# Patient Record
Sex: Female | Born: 1950 | Race: White | Hispanic: No | Marital: Married | Smoking: Never smoker
Health system: Southern US, Community
[De-identification: ages and names within clinical notes are randomized; demographics above are authoritative.]

## PROBLEM LIST (undated history)

## (undated) DIAGNOSIS — M1990 Unspecified osteoarthritis, unspecified site: Secondary | ICD-10-CM

## (undated) DIAGNOSIS — R0600 Dyspnea, unspecified: Secondary | ICD-10-CM

## (undated) DIAGNOSIS — M8580 Other specified disorders of bone density and structure, unspecified site: Secondary | ICD-10-CM

## (undated) DIAGNOSIS — C50911 Malignant neoplasm of unspecified site of right female breast: Secondary | ICD-10-CM

## (undated) DIAGNOSIS — D649 Anemia, unspecified: Secondary | ICD-10-CM

## (undated) DIAGNOSIS — R112 Nausea with vomiting, unspecified: Secondary | ICD-10-CM

## (undated) DIAGNOSIS — F419 Anxiety disorder, unspecified: Secondary | ICD-10-CM

## (undated) DIAGNOSIS — J449 Chronic obstructive pulmonary disease, unspecified: Secondary | ICD-10-CM

## (undated) DIAGNOSIS — C50919 Malignant neoplasm of unspecified site of unspecified female breast: Secondary | ICD-10-CM

## (undated) DIAGNOSIS — Z803 Family history of malignant neoplasm of breast: Secondary | ICD-10-CM

## (undated) DIAGNOSIS — Z9889 Other specified postprocedural states: Secondary | ICD-10-CM

## (undated) DIAGNOSIS — C189 Malignant neoplasm of colon, unspecified: Secondary | ICD-10-CM

## (undated) DIAGNOSIS — A319 Mycobacterial infection, unspecified: Secondary | ICD-10-CM

## (undated) DIAGNOSIS — H019 Unspecified inflammation of eyelid: Secondary | ICD-10-CM

## (undated) DIAGNOSIS — T7840XA Allergy, unspecified, initial encounter: Secondary | ICD-10-CM

## (undated) HISTORY — DX: Unspecified inflammation of eyelid: H01.9

## (undated) HISTORY — PX: DILATION AND CURETTAGE OF UTERUS: SHX78

## (undated) HISTORY — DX: Malignant neoplasm of unspecified site of unspecified female breast: C50.919

## (undated) HISTORY — PX: BACK SURGERY: SHX140

## (undated) HISTORY — DX: Family history of malignant neoplasm of breast: Z80.3

---
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
## 1993-05-02 DIAGNOSIS — T7840XA Allergy, unspecified, initial encounter: Secondary | ICD-10-CM

## 1993-05-02 HISTORY — DX: Allergy, unspecified, initial encounter: T78.40XA

## 1998-07-09 ENCOUNTER — Other Ambulatory Visit: Admission: RE | Admit: 1998-07-09 | Discharge: 1998-07-09 | Payer: Self-pay | Admitting: Obstetrics and Gynecology

## 1998-07-22 ENCOUNTER — Encounter: Payer: Self-pay | Admitting: Obstetrics & Gynecology

## 1998-07-22 ENCOUNTER — Ambulatory Visit (HOSPITAL_COMMUNITY): Admission: RE | Admit: 1998-07-22 | Discharge: 1998-07-22 | Payer: Self-pay | Admitting: Obstetrics & Gynecology

## 1999-03-01 ENCOUNTER — Encounter: Admission: RE | Admit: 1999-03-01 | Discharge: 1999-03-01 | Payer: Self-pay | Admitting: Obstetrics and Gynecology

## 1999-03-01 ENCOUNTER — Encounter: Payer: Self-pay | Admitting: Obstetrics and Gynecology

## 1999-11-11 ENCOUNTER — Other Ambulatory Visit: Admission: RE | Admit: 1999-11-11 | Discharge: 1999-11-11 | Payer: Self-pay | Admitting: Obstetrics and Gynecology

## 2001-05-02 HISTORY — PX: LUMBAR DISC SURGERY: SHX700

## 2001-05-02 HISTORY — PX: BACK SURGERY: SHX140

## 2001-05-11 ENCOUNTER — Encounter: Payer: Self-pay | Admitting: Family Medicine

## 2001-05-11 ENCOUNTER — Encounter: Admission: RE | Admit: 2001-05-11 | Discharge: 2001-05-11 | Payer: Self-pay | Admitting: Family Medicine

## 2001-05-17 ENCOUNTER — Encounter: Payer: Self-pay | Admitting: Neurological Surgery

## 2001-05-17 ENCOUNTER — Ambulatory Visit (HOSPITAL_COMMUNITY): Admission: RE | Admit: 2001-05-17 | Discharge: 2001-05-18 | Payer: Self-pay | Admitting: Neurological Surgery

## 2001-05-31 ENCOUNTER — Other Ambulatory Visit: Admission: RE | Admit: 2001-05-31 | Discharge: 2001-05-31 | Payer: Self-pay | Admitting: Obstetrics and Gynecology

## 2001-07-05 ENCOUNTER — Encounter: Payer: Self-pay | Admitting: Obstetrics and Gynecology

## 2001-07-05 ENCOUNTER — Encounter: Admission: RE | Admit: 2001-07-05 | Discharge: 2001-07-05 | Payer: Self-pay | Admitting: Obstetrics and Gynecology

## 2002-11-27 ENCOUNTER — Encounter: Payer: Self-pay | Admitting: Emergency Medicine

## 2002-11-27 ENCOUNTER — Observation Stay (HOSPITAL_COMMUNITY): Admission: EM | Admit: 2002-11-27 | Discharge: 2002-11-28 | Payer: Self-pay

## 2005-08-19 ENCOUNTER — Encounter: Admission: RE | Admit: 2005-08-19 | Discharge: 2005-08-19 | Payer: Self-pay | Admitting: Family Medicine

## 2010-08-26 ENCOUNTER — Emergency Department (HOSPITAL_COMMUNITY)
Admission: EM | Admit: 2010-08-26 | Discharge: 2010-08-26 | Disposition: A | Discharge status: Home / Self Care | Payer: BC Managed Care – PPO | Attending: Emergency Medicine | Admitting: Emergency Medicine

## 2010-08-26 DIAGNOSIS — R42 Dizziness and giddiness: Secondary | ICD-10-CM | POA: Insufficient documentation

## 2010-08-26 DIAGNOSIS — R55 Syncope and collapse: Secondary | ICD-10-CM | POA: Insufficient documentation

## 2016-03-04 ENCOUNTER — Other Ambulatory Visit: Payer: Self-pay | Admitting: Nurse Practitioner

## 2016-03-04 DIAGNOSIS — Z1231 Encounter for screening mammogram for malignant neoplasm of breast: Secondary | ICD-10-CM

## 2016-03-04 DIAGNOSIS — E2839 Other primary ovarian failure: Secondary | ICD-10-CM

## 2017-05-03 ENCOUNTER — Ambulatory Visit: Payer: BLUE CROSS/BLUE SHIELD | Admitting: Radiation Oncology

## 2017-09-29 ENCOUNTER — Other Ambulatory Visit: Payer: Self-pay | Admitting: Nurse Practitioner

## 2017-09-29 DIAGNOSIS — N63 Unspecified lump in unspecified breast: Secondary | ICD-10-CM

## 2017-09-30 DIAGNOSIS — C50911 Malignant neoplasm of unspecified site of right female breast: Secondary | ICD-10-CM

## 2017-09-30 HISTORY — PX: BREAST BIOPSY: SHX20

## 2017-09-30 HISTORY — DX: Malignant neoplasm of unspecified site of right female breast: C50.911

## 2017-10-05 ENCOUNTER — Other Ambulatory Visit: Payer: Self-pay | Admitting: Nurse Practitioner

## 2017-10-05 DIAGNOSIS — N63 Unspecified lump in unspecified breast: Secondary | ICD-10-CM

## 2017-10-19 ENCOUNTER — Ambulatory Visit
Admission: RE | Admit: 2017-10-19 | Discharge: 2017-10-19 | Disposition: A | Discharge status: Home / Self Care | Payer: Medicare Other | Source: Ambulatory Visit | Attending: Nurse Practitioner | Admitting: Nurse Practitioner

## 2017-10-19 ENCOUNTER — Other Ambulatory Visit: Payer: Self-pay | Admitting: Nurse Practitioner

## 2017-10-19 DIAGNOSIS — N63 Unspecified lump in unspecified breast: Secondary | ICD-10-CM

## 2017-10-19 DIAGNOSIS — R599 Enlarged lymph nodes, unspecified: Secondary | ICD-10-CM

## 2017-10-19 DIAGNOSIS — N6310 Unspecified lump in the right breast, unspecified quadrant: Secondary | ICD-10-CM

## 2017-10-23 ENCOUNTER — Ambulatory Visit
Admission: RE | Admit: 2017-10-23 | Discharge: 2017-10-23 | Disposition: A | Discharge status: Home / Self Care | Payer: Medicare Other | Source: Ambulatory Visit | Attending: Nurse Practitioner | Admitting: Nurse Practitioner

## 2017-10-23 ENCOUNTER — Other Ambulatory Visit: Payer: Self-pay | Admitting: Nurse Practitioner

## 2017-10-23 DIAGNOSIS — R599 Enlarged lymph nodes, unspecified: Secondary | ICD-10-CM

## 2017-10-23 DIAGNOSIS — N6310 Unspecified lump in the right breast, unspecified quadrant: Secondary | ICD-10-CM

## 2017-10-25 ENCOUNTER — Encounter: Payer: Self-pay | Admitting: *Deleted

## 2017-10-25 ENCOUNTER — Telehealth: Payer: Self-pay | Admitting: Oncology

## 2017-10-25 NOTE — Telephone Encounter (Signed)
Spoke with patient to confirm morning BC appointment for 7/3, packet mailed to patient

## 2017-10-27 ENCOUNTER — Other Ambulatory Visit: Payer: Self-pay | Admitting: *Deleted

## 2017-10-27 DIAGNOSIS — C50511 Malignant neoplasm of lower-outer quadrant of right female breast: Secondary | ICD-10-CM | POA: Insufficient documentation

## 2017-10-27 DIAGNOSIS — Z170 Estrogen receptor positive status [ER+]: Principal | ICD-10-CM

## 2017-10-31 NOTE — Progress Notes (Signed)
[place]  Telephone:([phone]) [phone] Fax:(336) [phone]     ID: Sonia Sandoval DOB: [name] 02, 1951  MR#: [id number]  AQT#:[id number]  Patient Care Team: [name], [place] as PCP - General (Nurse Practitioner) [name], MD as Consulting Physician (General Surgery) Payslee Bateson, [name], MD as Consulting Physician (Oncology) [name], MD as Consulting Physician (Radiation Oncology) OTHER MD:  CHIEF COMPLAINT: Estrogen receptor positive breast cancer  CURRENT TREATMENT: Neoadjuvant treatment   HISTORY OF CURRENT ILLNESS: "Sonia Sandoval" felt that her right breast was noticeably smaller than the left. She brought this to her nurse practitioner's attention.  She had not had mammography in several years.  Sonia Sandoval was set up for bilateral diagnostic mammography with tomography and right breast ultrasonography at [facility] on 10/19/2017 showing: Breast density category B.  Highly suspicious mass in the right lower outer quadrant measuring 3.3 x 2.8 x 3.2 cm,  2 cm from the nipple with associated skin retraction. The right axilla showed a 9 mm lymph node with cortical thickening. There was no evidence of malignancy in the left breast.   Accordingly on 10/23/2017 she proceeded to biopsy of the right breast area in question. The pathology from this procedure showed ([id number]): Invasive ductal carcinoma grade III. The right axillary lymph node was also positive.  There was no lymph node tissue identified. Prognostic indicators significant for: estrogen receptor, 95% positive and progesterone receptor, 45% positive, both with strong staining intensity. Proliferation marker Ki67 at 20%. HER2  not amplified with ratios HER2/CEP17 signals 1.25 and average HER2 copies per cell 3.00  The patient's subsequent history is as detailed below.  INTERVAL HISTORY: Sonia Sandoval was evaluated in the multidisciplinary breast cancer clinic on  11/01/2017 accompanied by her husband, [name]. Her case was also  presented at the multidisciplinary breast cancer conference on the same day. At that time a preliminary plan was proposed: Neoadjuvant treatment was suggested, Mammaprint will decide whether she receives chemo or not; breast conserving surgery to follow, as well as adjuvant radiation   REVIEW OF SYSTEMS: Aside from the breast mass itself, there were no suspicious symptoms leading to the diagnostic mammogram.  The patient denies unusual headaches, visual changes, nausea, vomiting, stiff neck, dizziness, or gait imbalance. There has been no cough, phlegm production, or pleurisy, no chest pain or pressure, and no change in bowel or bladder habits. The patient denies fever, rash, bleeding, unexplained fatigue or unexplained weight loss. A detailed review of systems was otherwise entirely negative.   PAST MEDICAL HISTORY: History reviewed. No pertinent past medical history.  She denies a history of cataracts, glaucoma, emphysema, asthma, HTN, heart murmur or palpitations, GERD, stomach ulcers, issues with bowels or bladder.    PAST SURGICAL HISTORY: History reviewed. No pertinent surgical history.  Ruptured vertebral disk surgery in 2003 under Dr. Ellene Route  FAMILY HISTORY Family History  Problem Relation Age of Onset  . Lung cancer Father     The patient's father died at age 30 due to lung cancer (heavy smoker). The patient's mother died at age 74 due to MI and Alzheimer's. The patient had no brothers or sisters. The patient's mother was 1/10 siblings, with a few other siblings having a history of Alzheimer's. There was a maternal aunt who may have had ovarian cancer, and the patient is going to verify this with family.   GYNECOLOGIC HISTORY:  No LMP recorded. Menarche: 67 years old Age at first live birth: 67 years old She is GXP4.  LMP: about 25 years ago (  early 34's). She used hormone replacement for about 3-5 years. She also used oral contraception for 1 year with no complications.     SOCIAL HISTORY:  Sonia Sandoval is retired from working at [employer]. Her husband of 48 years, [name], is a Writer. He also used to be a paramedic. The patient and her husband share 1 daughter, Cloyde Reams age 64 who lives in [place], [place] as a Writer. The patient has 3 sons from a previous marriage. The oldest, A.C. Penn Marcello Moores age 1 is disabled with epilepsy. The second son B. Desmond Lope age 14, lives in [place], [place] in Press photographer. The third son, H. Vevelyn Royals age 11, lives in [place], [place] in sales.      ADVANCED DIRECTIVES: The patient's husband, [name] is her HCPOA   HEALTH MAINTENANCE: Social History   Tobacco Use  . Smoking status: Never Smoker  . Smokeless tobacco: Never Used  Substance Use Topics  . Alcohol use: Yes  . Drug use: Never     Colonoscopy: 2013 in [place]  PAP: 2014  Bone density: 2014/ osteopenia   Allergies  Allergen Reactions  . Codeine Nausea And Vomiting    headache    Current Outpatient Medications  Medication Sig Dispense Refill  . naproxen sodium (ALEVE) 220 MG tablet Take 220 mg by mouth.    . Nutritional Supplements (JUICE PLUS FIBRE PO) Take by mouth daily.     No current facility-administered medications for this visit.     OBJECTIVE: Middle-aged white woman who appears well  Vitals:   11/01/17 0912  BP: (!) 147/87  Pulse: 74  Resp: 17  Temp: 98.5 F (36.9 C)  SpO2: 100%     Body mass index is 26.7 kg/m.   Wt Readings from Last 3 Encounters:  11/01/17 146 lb (66.2 kg)      ECOG FS:0 - Asymptomatic  Ocular: Sclerae unicteric, pupils round and equal Ear-nose-throat: Oropharynx clear and moist Lymphatic: No cervical or supraclavicular adenopathy Lungs no rales or rhonchi Heart regular rate and rhythm Abd soft, nontender, positive bowel sounds MSK no focal spinal tenderness, no joint edema Neuro: non-focal, well-oriented, appropriate affect Breasts: The right breast shows a  distortion in contour, with the nipple pointing slightly downward with retraction.  This is imaged below.  There is no erythema.  The left breast is benign.  Both axillae are benign.  Right Breast 11/01/2017      LAB RESULTS:  CMP     Component Value Date/Time   NA 139 11/01/2017 0850   K 3.9 11/01/2017 0850   CL 105 11/01/2017 0850   CO2 27 11/01/2017 0850   GLUCOSE 91 11/01/2017 0850   BUN 16 11/01/2017 0850   CREATININE 0.79 11/01/2017 0850   CALCIUM 9.7 11/01/2017 0850   PROT 7.0 11/01/2017 0850   ALBUMIN 4.5 11/01/2017 0850   AST 21 11/01/2017 0850   ALT 20 11/01/2017 0850   ALKPHOS 65 11/01/2017 0850   BILITOT 0.5 11/01/2017 0850   GFRNONAA >60 11/01/2017 0850   GFRAA >60 11/01/2017 0850    No results found for: TOTALPROTELP, ALBUMINELP, A1GS, A2GS, BETS, BETA2SER, GAMS, MSPIKE, SPEI  No results found for: KPAFRELGTCHN, LAMBDASER, [facility]  Lab Results  Component Value Date   WBC 3.5 (L) 11/01/2017   NEUTROABS 2.0 11/01/2017   HGB 13.8 11/01/2017   HCT 41.7 11/01/2017   MCV 92.0 11/01/2017   PLT 246 11/01/2017    _0 @  No results found for: LABCA2  No components found for: GOTLXB262  No results for input(s): INR in the last 168 hours.  No results found for: LABCA2  No results found for: MBT597  No results found for: CBU384  No results found for: TXM468  No results found for: CA2729  No components found for: HGQUANT  No results found for: CEA1 / No results found for: CEA1   No results found for: AFPTUMOR  No results found for: CHROMOGRNA  No results found for: PSA1  Appointment on 11/01/2017  Component Date Value Ref Range Status  . Sodium 11/01/2017 139  135 - 145 mmol/L Final   Please note reference intervals were recently updated.  . Potassium 11/01/2017 3.9  3.5 - 5.1 mmol/L Final  . Chloride 11/01/2017 105  98 - 111 mmol/L Final  . CO2 11/01/2017 27  22 - 32 mmol/L Final  . Glucose, Bld 11/01/2017 91  70 - 99  mg/dL Final  . BUN 11/01/2017 16  8 - 23 mg/dL Final   Please note change in reference range.  . Creatinine 11/01/2017 0.79  0.44 - 1.00 mg/dL Final  . Calcium 11/01/2017 9.7  8.9 - 10.3 mg/dL Final  . Total Protein 11/01/2017 7.0  6.5 - 8.1 g/dL Final  . Albumin 11/01/2017 4.5  3.5 - 5.0 g/dL Final  . AST 11/01/2017 21  15 - 41 U/L Final  . ALT 11/01/2017 20  0 - 44 U/L Final  . Alkaline Phosphatase 11/01/2017 65  38 - 126 U/L Final  . Total Bilirubin 11/01/2017 0.5  0.3 - 1.2 mg/dL Final  . GFR, Est Non Af Am 11/01/2017 >60  >60 mL/min Final  . GFR, Est AFR Am 11/01/2017 >60  >60 mL/min Final   Comment: (NOTE) The eGFR has been calculated using the CKD EPI equation. This calculation has not been validated in all clinical situations. eGFR's persistently <60 mL/min signify possible Chronic Kidney Disease.   Georgiann Hahn gap 11/01/2017 7  5 - 15 Final   Performed at [facility], [place] [address] [place], [place] [address]  . WBC Count 11/01/2017 3.5* 3.9 - 10.3 K/uL Final  . RBC 11/01/2017 4.53  3.70 - 5.45 MIL/uL Final  . Hemoglobin 11/01/2017 13.8  11.6 - 15.9 g/dL Final  . HCT 11/01/2017 41.7  34.8 - 46.6 % Final  . MCV 11/01/2017 92.0  79.5 - 101.0 fL Final  . MCH 11/01/2017 30.4  25.1 - 34.0 pg Final  . MCHC 11/01/2017 33.0  31.5 - 36.0 g/dL Final  . RDW 11/01/2017 12.8  11.2 - 14.5 % Final  . Platelet Count 11/01/2017 246  145 - 400 K/uL Final  . Neutrophils Relative % 11/01/2017 57  % Final  . Neutro Abs 11/01/2017 2.0  1.5 - 6.5 K/uL Final  . Lymphocytes Relative 11/01/2017 32  % Final  . Lymphs Abs 11/01/2017 1.1  0.9 - 3.3 K/uL Final  . Monocytes Relative 11/01/2017 8  % Final  . Monocytes Absolute 11/01/2017 0.3  0.1 - 0.9 K/uL Final  . Eosinophils Relative 11/01/2017 2  % Final  . Eosinophils Absolute 11/01/2017 0.1  0.0 - 0.5 K/uL Final  . Basophils Relative 11/01/2017 1  % Final  . Basophils Absolute 11/01/2017 0.0  0.0 - 0.1 K/uL Final    Performed at [facility], [place] [address], [place], [place] [address]    (this displays the last labs from the last 3 days)  No results found for: TOTALPROTELP, ALBUMINELP, A1GS,  A2GS, BETS, BETA2SER, GAMS, MSPIKE, SPEI (this displays SPEP labs)  No results found for: KPAFRELGTCHN, LAMBDASER, KAPLAMBRATIO (kappa/lambda light chains)  No results found for: HGBA, HGBA2QUANT, HGBFQUANT, HGBSQUAN (Hemoglobinopathy evaluation)   No results found for: LDH  No results found for: IRON, TIBC, IRONPCTSAT (Iron and TIBC)  No results found for: FERRITIN  Urinalysis No results found for: COLORURINE, APPEARANCEUR, LABSPEC, PHURINE, GLUCOSEU, HGBUR, BILIRUBINUR, KETONESUR, PROTEINUR, UROBILINOGEN, NITRITE, LEUKOCYTESUR   STUDIES: US Breast Ltd Uni Right Inc Axilla  Result Date: 10/19/2017 CLINICAL DATA:  67 year old female with new palpable RIGHT breast mass identified on self-examination. Also for annual bilateral mammograms. EXAM: DIGITAL DIAGNOSTIC BILATERAL MAMMOGRAM WITH CAD AND TOMO ULTRASOUND RIGHT BREAST COMPARISON:  05/25/2012 and prior mammograms ACR Breast Density Category b: There are scattered areas of fibroglandular density. FINDINGS: 2D/3D full field views of both breasts and spot compression view of the RIGHT breast demonstrate an irregular mass within the LOWER RIGHT breast with skin retraction. No suspicious LEFT breast findings are identified. Mammographic images were processed with CAD. On physical exam, a firm palpable fixed mass with overlying skin retraction at the 6:30 position of the RIGHT breast 2 cm from the nipple. Targeted ultrasound is performed, showing a 3.3 x 2.8 x 3.2 cm irregular hypoechoic mass at the 6:30 position of the RIGHT breast 2 cm from the nipple, with extension to the skin and close to the chest wall. A 9 mm low RIGHT axillary lymph node with cortical thickening is noted. IMPRESSION: 1. Highly suspicious 3.3 cm LOWER OUTER RIGHT  breast mass with associated skin retraction and one suspicious low RIGHT axillary lymph node. Tissue sampling of the RIGHT breast mass and RIGHT axillary lymph node recommended. 2. No mammographic evidence of LEFT breast malignancy. RECOMMENDATION: Ultrasound-guided RIGHT breast mass biopsy and ultrasound-guided RIGHT axillary lymph node biopsy, which has been scheduled for 10/23/2017. I have discussed the findings and recommendations with the patient. Results were also provided in writing at the conclusion of the visit. If applicable, a reminder letter will be sent to the patient regarding the next appointment. BI-RADS CATEGORY  5: Highly suggestive of malignancy. Electronically Signed   By: Margarette Canada M.D.   On: 10/19/2017 12:20   Mm Diag Breast Tomo Bilateral  Result Date: 10/19/2017 CLINICAL DATA:  67 year old female with new palpable RIGHT breast mass identified on self-examination. Also for annual bilateral mammograms. EXAM: DIGITAL DIAGNOSTIC BILATERAL MAMMOGRAM WITH CAD AND TOMO ULTRASOUND RIGHT BREAST COMPARISON:  05/25/2012 and prior mammograms ACR Breast Density Category b: There are scattered areas of fibroglandular density. FINDINGS: 2D/3D full field views of both breasts and spot compression view of the RIGHT breast demonstrate an irregular mass within the LOWER RIGHT breast with skin retraction. No suspicious LEFT breast findings are identified. Mammographic images were processed with CAD. On physical exam, a firm palpable fixed mass with overlying skin retraction at the 6:30 position of the RIGHT breast 2 cm from the nipple. Targeted ultrasound is performed, showing a 3.3 x 2.8 x 3.2 cm irregular hypoechoic mass at the 6:30 position of the RIGHT breast 2 cm from the nipple, with extension to the skin and close to the chest wall. A 9 mm low RIGHT axillary lymph node with cortical thickening is noted. IMPRESSION: 1. Highly suspicious 3.3 cm LOWER OUTER RIGHT breast mass with associated skin  retraction and one suspicious low RIGHT axillary lymph node. Tissue sampling of the RIGHT breast mass and RIGHT axillary lymph node recommended. 2. No mammographic evidence of LEFT breast  malignancy. RECOMMENDATION: Ultrasound-guided RIGHT breast mass biopsy and ultrasound-guided RIGHT axillary lymph node biopsy, which has been scheduled for 10/23/2017. I have discussed the findings and recommendations with the patient. Results were also provided in writing at the conclusion of the visit. If applicable, a reminder letter will be sent to the patient regarding the next appointment. BI-RADS CATEGORY  5: Highly suggestive of malignancy. Electronically Signed   By: Margarette Canada M.D.   On: 10/19/2017 12:20   [place] Axillary Node Core Biopsy Right  Addendum Date: 10/25/2017   ADDENDUM REPORT: 10/25/2017 12:24 ADDENDUM: Pathology revealed GRADE III - INVASIVE DUCTAL CARCINOMA of RIGHT breast, axilla, 6:30 o'clock. This was found to be concordant by Dr. Ammie Ferrier. Pathology revealed GRADE III - INVASIVE DUCTAL CARCINOMA. LYMPHOID TISSUE IS NOT IDENTIFIED of RIGHT axilla, which may represent an entirely replaced lymph node. This was found to be concordant by Dr. Ammie Ferrier. Pathology results were discussed with the patient by telephone. The patient reported doing well after the biopsy with tenderness at the site. Post biopsy instructions and care were reviewed and questions were answered. The patient was encouraged to call [facility] for any additional concerns. The patient was referred to [facility] on November 01, 2017. Pathology results reported by Roselind Messier, RN on 10/25/2017. Electronically Signed   By: Ammie Ferrier M.D.   On: 10/25/2017 12:24   Result Date: 10/25/2017 CLINICAL DATA:  67 year old female presenting for ultrasound-guided biopsy of a right breast mass and a right axillary lymph node. EXAM:  ULTRASOUND GUIDED RIGHT BREAST CORE NEEDLE BIOPSY COMPARISON:  Previous exam(s). FINDINGS: I met with the patient and we discussed the procedure of ultrasound-guided biopsy, including benefits and alternatives. We discussed the high likelihood of a successful procedure. We discussed the risks of the procedure, including infection, bleeding, tissue injury, clip migration, and inadequate sampling. Informed written consent was given. The usual time-out protocol was performed immediately prior to the procedure. Lesion quadrant: Lower outer quadrant Using sterile technique and 1% Lidocaine as local anesthetic, under direct ultrasound visualization, a 14 gauge spring-loaded device was used to perform biopsy of a mass in the right breast at 6:30 using an inferior approach. At the conclusion of the procedure a ribbon shaped tissue marker clip was deployed into the biopsy cavity. Lesion quadrant: Right axilla Using sterile technique and 1% Lidocaine as local anesthetic, under direct ultrasound visualization, a 14 gauge spring-loaded device was used to perform biopsy of a right axillary lymph node using an inferior approach. At the conclusion of the procedure a HydroMARK tissue marker clip was deployed into the biopsy cavity. Follow up 2 view mammogram was performed and dictated separately. IMPRESSION: 1. Ultrasound guided biopsy of a right breast mass at 6:30. No apparent complications. 2. Ultrasound-guided biopsy of a right axillary lymph node. No apparent complications. Electronically Signed: By: Ammie Ferrier M.D. On: 10/23/2017 13:37   Mm Clip Placement Right  Result Date: 10/23/2017 CLINICAL DATA:  Post biopsy mammogram of the right breast for clip placement. EXAM: DIAGNOSTIC RIGHT MAMMOGRAM POST ULTRASOUND BIOPSY COMPARISON:  Previous exam(s). FINDINGS: Mammographic images were obtained following ultrasound guided biopsy of a mass in the right breast and a right axillary lymph node. The ribbon shaped biopsy  marking clip is positioned within the biopsied mass along the superolateral margin. The biopsy marking clip in the right axilla cannot be seen due to its far posterior location. IMPRESSION: 1. The  ribbon shaped biopsy marking clip is positioned at the site of biopsy and located along the superolateral aspect of the mass. 2. The biopsy marking clip in the right axilla cannot be seen due to its deep position. Final Assessment: Post Procedure Mammograms for Marker Placement Electronically Signed   By: Ammie Ferrier M.D.   On: 10/23/2017 13:47   [place] Rt Breast Bx W Loc Dev 1st Lesion Img Bx Spec US Guide  Addendum Date: 10/25/2017   ADDENDUM REPORT: 10/25/2017 12:24 ADDENDUM: Pathology revealed GRADE III - INVASIVE DUCTAL CARCINOMA of RIGHT breast, axilla, 6:30 o'clock. This was found to be concordant by Dr. Ammie Ferrier. Pathology revealed GRADE III - INVASIVE DUCTAL CARCINOMA. LYMPHOID TISSUE IS NOT IDENTIFIED of RIGHT axilla, which may represent an entirely replaced lymph node. This was found to be concordant by Dr. Ammie Ferrier. Pathology results were discussed with the patient by telephone. The patient reported doing well after the biopsy with tenderness at the site. Post biopsy instructions and care were reviewed and questions were answered. The patient was encouraged to call [facility] for any additional concerns. The patient was referred to [facility] on November 01, 2017. Pathology results reported by Roselind Messier, RN on 10/25/2017. Electronically Signed   By: Ammie Ferrier M.D.   On: 10/25/2017 12:24   Addendum Date: 10/23/2017   ADDENDUM REPORT: 10/23/2017 13:58 ADDENDUM: The hydromark biopsy marking clip shaped used is a spring. Electronically Signed   By: Ammie Ferrier M.D.   On: 10/23/2017 13:58   Result Date: 10/25/2017 CLINICAL DATA:  67 year old female presenting for  ultrasound-guided biopsy of a right breast mass and a right axillary lymph node. EXAM: ULTRASOUND GUIDED RIGHT BREAST CORE NEEDLE BIOPSY COMPARISON:  Previous exam(s). FINDINGS: I met with the patient and we discussed the procedure of ultrasound-guided biopsy, including benefits and alternatives. We discussed the high likelihood of a successful procedure. We discussed the risks of the procedure, including infection, bleeding, tissue injury, clip migration, and inadequate sampling. Informed written consent was given. The usual time-out protocol was performed immediately prior to the procedure. Lesion quadrant: Lower outer quadrant Using sterile technique and 1% Lidocaine as local anesthetic, under direct ultrasound visualization, a 14 gauge spring-loaded device was used to perform biopsy of a mass in the right breast at 6:30 using an inferior approach. At the conclusion of the procedure a ribbon shaped tissue marker clip was deployed into the biopsy cavity. Lesion quadrant: Right axilla Using sterile technique and 1% Lidocaine as local anesthetic, under direct ultrasound visualization, a 14 gauge spring-loaded device was used to perform biopsy of a right axillary lymph node using an inferior approach. At the conclusion of the procedure a HydroMARK tissue marker clip was deployed into the biopsy cavity. Follow up 2 view mammogram was performed and dictated separately. IMPRESSION: 1. Ultrasound guided biopsy of a right breast mass at 6:30. No apparent complications. 2. Ultrasound-guided biopsy of a right axillary lymph node. No apparent complications. Electronically Signed: By: Ammie Ferrier M.D. On: 10/23/2017 13:37    ELIGIBLE FOR AVAILABLE RESEARCH PROTOCOL: no  ASSESSMENT: 67 y.o. [place], [place] woman that is post right breast lower outer quadrant biopsy 10/23/2017 for a clinical T2 N1, stage IIB invasive ductal carcinoma, grade 3, estrogen and progesterone receptor positive, HER-2 not amplified, with an  MIB-1 of 20%  (1) Mammaprint to be obtained from the original biopsy  (2) neo-adjuvant treatment, most likely  with chemotherapy  (3) definitive surgery to follow  (4) adjuvant radiation as appropriate  (5) antiestrogens at the completion of local treatment    PLAN: We spent the better part of today's hour-long appointment discussing the biology of her diagnosis and the specifics of her situation. We first reviewed the fact that cancer is not one disease but more than 100 different diseases and that it is important to keep them separate-- otherwise when friends and relatives discuss their own cancer experiences with Teletha confusion can result. Similarly we explained that if breast cancer spreads to the bone or liver, the patient would not have bone cancer or liver cancer, but breast cancer in the bone and breast cancer in the liver: one cancer in three places-- not 3 different cancers which otherwise would have to be treated in 3 different ways.  We discussed the difference between local and systemic therapy. In terms of loco-regional treatment, lumpectomy plus radiation is equivalent to mastectomy as far as survival is concerned. We also noted that in terms of sequencing of treatments, whether systemic therapy or surgery is done first does not affect the ultimate outcome.  This is relevant to Sonia Sandoval's situation since we feel neoadjuvant treatment should help make the surgery easier and the cosmetic result better.  We then discussed the rationale for systemic therapy. There is some risk that this cancer may have already spread to other parts of her body.  We are going to obtain a CT scan of the chest and a bone scan to make sure we do not have stage IV disease, but she understands that even if the scans are completely negative they do not rule out microscopic spread of the cancer which is the reason she needs systemic therapy.  Next we went over the options for systemic therapy which are  anti-estrogens, anti-HER-2 immunotherapy, and chemotherapy. Aashritha does not meet criteria for anti-HER-2 immunotherapy. She is a good candidate for anti-estrogens.  The question of chemotherapy is more complicated. Chemotherapy is most effective in rapidly growing, aggressive tumors. It is much less effective in low-grade, slow growing cancers, like Myla 's. For that reason we are going to request a Mammaprint test from the initial biopsy sample. That will help [place] make a definitive decision regarding chemotherapy in this case.  If she does receive chemotherapy she would be an excellent candidate for the BCEP study and she expressed interest in participating.  Tentatively I have scheduled her to see me on 11/10/2017, to start chemotherapy 11/14/2017 assuming the Mammaprint is high risk as expected and she has her echocardiogram and port in place  Charitie has a good understanding of the overall plan. She agrees with it. She knows the goal of treatment in her case is cure. She will call with any problems that may develop before her next visit here.   Shivansh Hardaway, [name], MD  11/01/17 12:06 PM Medical Oncology and Hematology [facility] [address] [place], [place] [address] Tel. [phone]    Fax. [phone]  Alice Rieger, am acting as scribe for Chauncey Cruel MD.  I, Lurline Del MD, have reviewed the above documentation for accuracy and completeness, and I agree with the above.

## 2017-11-01 ENCOUNTER — Encounter: Payer: Self-pay | Admitting: Physical Therapy

## 2017-11-01 ENCOUNTER — Telehealth: Payer: Self-pay | Admitting: Oncology

## 2017-11-01 ENCOUNTER — Ambulatory Visit
Admission: RE | Admit: 2017-11-01 | Discharge: 2017-11-01 | Disposition: A | Discharge status: Home / Self Care | Payer: Medicare Other | Source: Ambulatory Visit | Attending: Radiation Oncology | Admitting: Radiation Oncology

## 2017-11-01 ENCOUNTER — Telehealth: Payer: Self-pay | Admitting: *Deleted

## 2017-11-01 ENCOUNTER — Other Ambulatory Visit: Payer: Self-pay | Admitting: *Deleted

## 2017-11-01 ENCOUNTER — Inpatient Hospital Stay: Payer: BLUE CROSS/BLUE SHIELD | Attending: Oncology | Admitting: Oncology

## 2017-11-01 ENCOUNTER — Inpatient Hospital Stay: Payer: BLUE CROSS/BLUE SHIELD

## 2017-11-01 ENCOUNTER — Other Ambulatory Visit: Payer: Self-pay

## 2017-11-01 ENCOUNTER — Ambulatory Visit: Payer: Self-pay | Admitting: Surgery

## 2017-11-01 ENCOUNTER — Encounter: Payer: Self-pay | Admitting: Oncology

## 2017-11-01 ENCOUNTER — Ambulatory Visit: Payer: BLUE CROSS/BLUE SHIELD | Attending: Surgery | Admitting: Physical Therapy

## 2017-11-01 VITALS — BP 147/87 | HR 74 | Temp 98.5°F | Resp 17 | Ht 62.0 in | Wt 146.0 lb

## 2017-11-01 DIAGNOSIS — C50511 Malignant neoplasm of lower-outer quadrant of right female breast: Secondary | ICD-10-CM

## 2017-11-01 DIAGNOSIS — R293 Abnormal posture: Secondary | ICD-10-CM | POA: Diagnosis present

## 2017-11-01 DIAGNOSIS — Z170 Estrogen receptor positive status [ER+]: Principal | ICD-10-CM

## 2017-11-01 DIAGNOSIS — Z801 Family history of malignant neoplasm of trachea, bronchus and lung: Secondary | ICD-10-CM | POA: Insufficient documentation

## 2017-11-01 LAB — CMP (CANCER CENTER ONLY)
ALBUMIN: 4.5 g/dL (ref 3.5–5.0)
ALK PHOS: 65 U/L (ref 38–126)
ALT: 20 U/L (ref 0–44)
AST: 21 U/L (ref 15–41)
Anion gap: 7 (ref 5–15)
BUN: 16 mg/dL (ref 8–23)
CALCIUM: 9.7 mg/dL (ref 8.9–10.3)
CHLORIDE: 105 mmol/L (ref 98–111)
CO2: 27 mmol/L (ref 22–32)
CREATININE: 0.79 mg/dL (ref 0.44–1.00)
GFR, Estimated: >60 mL/min (ref >60)
GLUCOSE: 91 mg/dL (ref 70–99)
Potassium: 3.9 mmol/L (ref 3.5–5.1)
Sodium: 139 mmol/L (ref 135–145)
Total Bilirubin: 0.5 mg/dL (ref 0.3–1.2)
Total Protein: 7 g/dL (ref 6.5–8.1)

## 2017-11-01 LAB — CBC WITH DIFFERENTIAL (CANCER CENTER ONLY)
BASOS PCT: 1 %
Basophils Absolute: 0 10*3/uL (ref 0.0–0.1)
EOS PCT: 2 %
Eosinophils Absolute: 0.1 10*3/uL (ref 0.0–0.5)
HCT: 41.7 % (ref 34.8–46.6)
HEMOGLOBIN: 13.8 g/dL (ref 11.6–15.9)
LYMPHS ABS: 1.1 10*3/uL (ref 0.9–3.3)
Lymphocytes Relative: 32 %
MCH: 30.4 pg (ref 25.1–34.0)
MCHC: 33 g/dL (ref 31.5–36.0)
MCV: 92 fL (ref 79.5–101.0)
MONO ABS: 0.3 10*3/uL (ref 0.1–0.9)
MONOS PCT: 8 %
NEUTROS PCT: 57 %
Neutro Abs: 2 10*3/uL (ref 1.5–6.5)
Platelet Count: 246 10*3/uL (ref 145–400)
RBC: 4.53 MIL/uL (ref 3.70–5.45)
RDW: 12.8 % (ref 11.2–14.5)
WBC Count: 3.5 10*3/uL — ABNORMAL LOW (ref 3.9–10.3)

## 2017-11-01 NOTE — Patient Instructions (Signed)

## 2017-11-01 NOTE — Progress Notes (Signed)
Radiation Oncology         [phone] ________________________________  Name: Sonia Sandoval        MRN: [id number]  Date of Service: 11/01/2017 DOB: [birth year]  CC:[name], FNP  [name], MD     REFERRING PHYSICIAN: [name], MD   DIAGNOSIS: The encounter diagnosis was Malignant neoplasm of lower-outer quadrant of right breast of female, estrogen receptor positive ([place]).   HISTORY OF PRESENT ILLNESS: Sonia Sandoval is a 67 y.o. female seen in the multidisciplinary breast clinic for a new diagnosis of right breast cancer. The patient was noted to have a palpable mass in the right breast. Diagnostic imaging revealed a 3.3 x 3.2 x 2.8 cm in the right breast at 6:30. She had a 9 mm axillary node by ultrasound as well. She underwent a biopsy on 10/23/17 that revealed a grade 3, invasive ductal carcinoma. Her tumor was ER/PR positive, HER2 negative, and Ki 67 20%. She comes today to discuss treatment options for her cancer.     PREVIOUS RADIATION THERAPY: No   PAST MEDICAL HISTORY: No past medical history on file.     PAST SURGICAL HISTORY:No past surgical history on file.   FAMILY HISTORY:  Family History  Problem Relation Age of Onset  . Lung cancer Father      SOCIAL HISTORY:  reports that she has never smoked. She has never used smokeless tobacco. She reports that she drinks alcohol. She reports that she does not use drugs. The patient is married nad lives in [place]. She is retired and enjoys yard work. She enjoys an occasional beer. She has four adult children.   ALLERGIES: Codeine   MEDICATIONS:  Current Outpatient Medications  Medication Sig Dispense Refill  . naproxen sodium (ALEVE) 220 MG tablet Take 220 mg by mouth.    . Nutritional Supplements (JUICE PLUS FIBRE PO) Take by mouth daily.     No current facility-administered medications for this encounter.      REVIEW OF SYSTEMS: On review of systems, the patient reports that she is  doing well overall. She denies any chest pain, shortness of breath, cough, fevers, chills, night sweats, unintended weight changes. She denies any bowel or bladder disturbances, and denies abdominal pain, nausea or vomiting. She denies any new musculoskeletal or joint aches or pains. A complete review of systems is obtained and is otherwise negative.     PHYSICAL EXAM:  Wt Readings from Last 3 Encounters:  11/01/17 146 lb (66.2 kg)   Temp Readings from Last 3 Encounters:  11/01/17 98.5 F (36.9 C) (Oral)   BP Readings from Last 3 Encounters:  11/01/17 (!) 147/87   Pulse Readings from Last 3 Encounters:  11/01/17 74     In general this is a well appearing caucasian female in no acute distress. She is alert and oriented x4 and appropriate throughout the examination. HEENT reveals that the patient is normocephalic, atraumatic. EOMs are intact. PERRLA. Skin is intact without any evidence of gross lesions. Cardiovascular exam reveals a regular rate and rhythm, no clicks rubs or murmurs are auscultated. Chest is clear to auscultation bilaterally. Lymphatic assessment is performed and does not reveal any adenopathy in the cervical, or supraclavicular chains. Bilateral breast exam is performed and reveals retraction of the lower breast at 6:00 and fullness consistent with her known tumor. No nipple bleeding or discharge is noted. There is mild fullness in the right axilla. No palpable mass is noted of the left breast. Abdomen has  active bowel sounds in all quadrants and is intact. The abdomen is soft, non tender, non distended. Lower extremities are negative for pretibial pitting edema, deep calf tenderness, cyanosis or clubbing.   ECOG = 1  0 - Asymptomatic (Fully active, able to carry on all predisease activities without restriction)  1 - Symptomatic but completely ambulatory (Restricted in physically strenuous activity but ambulatory and able to carry out work of a light or sedentary nature.  For example, light housework, office work)  2 - Symptomatic, <50% in bed during the day (Ambulatory and capable of all self care but unable to carry out any work activities. Up and about more than 50% of waking hours)  3 - Symptomatic, >50% in bed, but not bedbound (Capable of only limited self-care, confined to bed or chair 50% or more of waking hours)  4 - Bedbound (Completely disabled. Cannot carry on any self-care. Totally confined to bed or chair)  5 - Death   Eustace Pen MM, Creech RH, Tormey DC, et al. [id number]). "Toxicity and response criteria of the [facility] Group". [place] Oncol. 5 (6): 649-55    LABORATORY DATA:  Lab Results  Component Value Date   WBC 3.5 (L) 11/01/2017   HGB 13.8 11/01/2017   HCT 41.7 11/01/2017   MCV 92.0 11/01/2017   PLT 246 11/01/2017   Lab Results  Component Value Date   NA 139 11/01/2017   K 3.9 11/01/2017   CL 105 11/01/2017   CO2 27 11/01/2017   Lab Results  Component Value Date   ALT 20 11/01/2017   AST 21 11/01/2017   ALKPHOS 65 11/01/2017   BILITOT 0.5 11/01/2017      RADIOGRAPHY: US Breast Ltd Uni Right Inc Axilla  Result Date: 10/19/2017 CLINICAL DATA:  67 year old female with new palpable RIGHT breast mass identified on self-examination. Also for annual bilateral mammograms. EXAM: DIGITAL DIAGNOSTIC BILATERAL MAMMOGRAM WITH CAD AND TOMO ULTRASOUND RIGHT BREAST COMPARISON:  05/25/2012 and prior mammograms ACR Breast Density Category b: There are scattered areas of fibroglandular density. FINDINGS: 2D/3D full field views of both breasts and spot compression view of the RIGHT breast demonstrate an irregular mass within the LOWER RIGHT breast with skin retraction. No suspicious LEFT breast findings are identified. Mammographic images were processed with CAD. On physical exam, a firm palpable fixed mass with overlying skin retraction at the 6:30 position of the RIGHT breast 2 cm from the nipple. Targeted ultrasound is  performed, showing a 3.3 x 2.8 x 3.2 cm irregular hypoechoic mass at the 6:30 position of the RIGHT breast 2 cm from the nipple, with extension to the skin and close to the chest wall. A 9 mm low RIGHT axillary lymph node with cortical thickening is noted. IMPRESSION: 1. Highly suspicious 3.3 cm LOWER OUTER RIGHT breast mass with associated skin retraction and one suspicious low RIGHT axillary lymph node. Tissue sampling of the RIGHT breast mass and RIGHT axillary lymph node recommended. 2. No mammographic evidence of LEFT breast malignancy. RECOMMENDATION: Ultrasound-guided RIGHT breast mass biopsy and ultrasound-guided RIGHT axillary lymph node biopsy, which has been scheduled for 10/23/2017. I have discussed the findings and recommendations with the patient. Results were also provided in writing at the conclusion of the visit. If applicable, a reminder letter will be sent to the patient regarding the next appointment. BI-RADS CATEGORY  5: Highly suggestive of malignancy. Electronically Signed   By: Margarette Canada M.D.   On: 10/19/2017 12:20   Mm Diag Breast Tomo  Bilateral  Result Date: 10/19/2017 CLINICAL DATA:  67 year old female with new palpable RIGHT breast mass identified on self-examination. Also for annual bilateral mammograms. EXAM: DIGITAL DIAGNOSTIC BILATERAL MAMMOGRAM WITH CAD AND TOMO ULTRASOUND RIGHT BREAST COMPARISON:  05/25/2012 and prior mammograms ACR Breast Density Category b: There are scattered areas of fibroglandular density. FINDINGS: 2D/3D full field views of both breasts and spot compression view of the RIGHT breast demonstrate an irregular mass within the LOWER RIGHT breast with skin retraction. No suspicious LEFT breast findings are identified. Mammographic images were processed with CAD. On physical exam, a firm palpable fixed mass with overlying skin retraction at the 6:30 position of the RIGHT breast 2 cm from the nipple. Targeted ultrasound is performed, showing a 3.3 x 2.8 x 3.2 cm  irregular hypoechoic mass at the 6:30 position of the RIGHT breast 2 cm from the nipple, with extension to the skin and close to the chest wall. A 9 mm low RIGHT axillary lymph node with cortical thickening is noted. IMPRESSION: 1. Highly suspicious 3.3 cm LOWER OUTER RIGHT breast mass with associated skin retraction and one suspicious low RIGHT axillary lymph node. Tissue sampling of the RIGHT breast mass and RIGHT axillary lymph node recommended. 2. No mammographic evidence of LEFT breast malignancy. RECOMMENDATION: Ultrasound-guided RIGHT breast mass biopsy and ultrasound-guided RIGHT axillary lymph node biopsy, which has been scheduled for 10/23/2017. I have discussed the findings and recommendations with the patient. Results were also provided in writing at the conclusion of the visit. If applicable, a reminder letter will be sent to the patient regarding the next appointment. BI-RADS CATEGORY  5: Highly suggestive of malignancy. Electronically Signed   By: Margarette Canada M.D.   On: 10/19/2017 12:20   [place] Axillary Node Core Biopsy Right  Addendum Date: 10/25/2017   ADDENDUM REPORT: 10/25/2017 12:24 ADDENDUM: Pathology revealed GRADE III - INVASIVE DUCTAL CARCINOMA of RIGHT breast, axilla, 6:30 o'clock. This was found to be concordant by Dr. Ammie Ferrier. Pathology revealed GRADE III - INVASIVE DUCTAL CARCINOMA. LYMPHOID TISSUE IS NOT IDENTIFIED of RIGHT axilla, which may represent an entirely replaced lymph node. This was found to be concordant by Dr. Ammie Ferrier. Pathology results were discussed with the patient by telephone. The patient reported doing well after the biopsy with tenderness at the site. Post biopsy instructions and care were reviewed and questions were answered. The patient was encouraged to call [facility] for any additional concerns. The patient was referred to [facility] on November 01, 2017. Pathology results reported by Roselind Messier, RN on 10/25/2017. Electronically Signed   By: Ammie Ferrier M.D.   On: 10/25/2017 12:24   Result Date: 10/25/2017 CLINICAL DATA:  67 year old female presenting for ultrasound-guided biopsy of a right breast mass and a right axillary lymph node. EXAM: ULTRASOUND GUIDED RIGHT BREAST CORE NEEDLE BIOPSY COMPARISON:  Previous exam(s). FINDINGS: I met with the patient and we discussed the procedure of ultrasound-guided biopsy, including benefits and alternatives. We discussed the high likelihood of a successful procedure. We discussed the risks of the procedure, including infection, bleeding, tissue injury, clip migration, and inadequate sampling. Informed written consent was given. The usual time-out protocol was performed immediately prior to the procedure. Lesion quadrant: Lower outer quadrant Using sterile technique and 1% Lidocaine as local anesthetic, under direct ultrasound visualization, a 14 gauge spring-loaded device was used to perform biopsy of a mass in the right breast at 6:30  using an inferior approach. At the conclusion of the procedure a ribbon shaped tissue marker clip was deployed into the biopsy cavity. Lesion quadrant: Right axilla Using sterile technique and 1% Lidocaine as local anesthetic, under direct ultrasound visualization, a 14 gauge spring-loaded device was used to perform biopsy of a right axillary lymph node using an inferior approach. At the conclusion of the procedure a HydroMARK tissue marker clip was deployed into the biopsy cavity. Follow up 2 view mammogram was performed and dictated separately. IMPRESSION: 1. Ultrasound guided biopsy of a right breast mass at 6:30. No apparent complications. 2. Ultrasound-guided biopsy of a right axillary lymph node. No apparent complications. Electronically Signed: By: Ammie Ferrier M.D. On: 10/23/2017 13:37   Mm Clip Placement Right  Result Date: 10/23/2017 CLINICAL DATA:  Post biopsy  mammogram of the right breast for clip placement. EXAM: DIAGNOSTIC RIGHT MAMMOGRAM POST ULTRASOUND BIOPSY COMPARISON:  Previous exam(s). FINDINGS: Mammographic images were obtained following ultrasound guided biopsy of a mass in the right breast and a right axillary lymph node. The ribbon shaped biopsy marking clip is positioned within the biopsied mass along the superolateral margin. The biopsy marking clip in the right axilla cannot be seen due to its far posterior location. IMPRESSION: 1. The ribbon shaped biopsy marking clip is positioned at the site of biopsy and located along the superolateral aspect of the mass. 2. The biopsy marking clip in the right axilla cannot be seen due to its deep position. Final Assessment: Post Procedure Mammograms for Marker Placement Electronically Signed   By: Ammie Ferrier M.D.   On: 10/23/2017 13:47   [place] Rt Breast Bx W Loc Dev 1st Lesion Img Bx Spec US Guide  Addendum Date: 10/25/2017   ADDENDUM REPORT: 10/25/2017 12:24 ADDENDUM: Pathology revealed GRADE III - INVASIVE DUCTAL CARCINOMA of RIGHT breast, axilla, 6:30 o'clock. This was found to be concordant by Dr. Ammie Ferrier. Pathology revealed GRADE III - INVASIVE DUCTAL CARCINOMA. LYMPHOID TISSUE IS NOT IDENTIFIED of RIGHT axilla, which may represent an entirely replaced lymph node. This was found to be concordant by Dr. Ammie Ferrier. Pathology results were discussed with the patient by telephone. The patient reported doing well after the biopsy with tenderness at the site. Post biopsy instructions and care were reviewed and questions were answered. The patient was encouraged to call [facility] for any additional concerns. The patient was referred to [facility] on November 01, 2017. Pathology results reported by Roselind Messier, RN on 10/25/2017. Electronically Signed   By: Ammie Ferrier M.D.   On: 10/25/2017  12:24   Addendum Date: 10/23/2017   ADDENDUM REPORT: 10/23/2017 13:58 ADDENDUM: The hydromark biopsy marking clip shaped used is a spring. Electronically Signed   By: Ammie Ferrier M.D.   On: 10/23/2017 13:58   Result Date: 10/25/2017 CLINICAL DATA:  67 year old female presenting for ultrasound-guided biopsy of a right breast mass and a right axillary lymph node. EXAM: ULTRASOUND GUIDED RIGHT BREAST CORE NEEDLE BIOPSY COMPARISON:  Previous exam(s). FINDINGS: I met with the patient and we discussed the procedure of ultrasound-guided biopsy, including benefits and alternatives. We discussed the high likelihood of a successful procedure. We discussed the risks of the procedure, including infection, bleeding, tissue injury, clip migration, and inadequate sampling. Informed written consent was given. The usual time-out protocol was performed immediately prior to the procedure. Lesion quadrant: Lower outer quadrant Using sterile technique and 1% Lidocaine as  local anesthetic, under direct ultrasound visualization, a 14 gauge spring-loaded device was used to perform biopsy of a mass in the right breast at 6:30 using an inferior approach. At the conclusion of the procedure a ribbon shaped tissue marker clip was deployed into the biopsy cavity. Lesion quadrant: Right axilla Using sterile technique and 1% Lidocaine as local anesthetic, under direct ultrasound visualization, a 14 gauge spring-loaded device was used to perform biopsy of a right axillary lymph node using an inferior approach. At the conclusion of the procedure a HydroMARK tissue marker clip was deployed into the biopsy cavity. Follow up 2 view mammogram was performed and dictated separately. IMPRESSION: 1. Ultrasound guided biopsy of a right breast mass at 6:30. No apparent complications. 2. Ultrasound-guided biopsy of a right axillary lymph node. No apparent complications. Electronically Signed: By: Ammie Ferrier M.D. On: 10/23/2017 13:37        IMPRESSION/PLAN: 1. Stage IIB, cT2N1M0, grade 3 ER/PR positive, invasive ductal carcinoma of the right breast. Dr. Lisbeth Renshaw discusses the pathology findings and reviews the nature of invasive breast disease. The consensus from the breast conference includes mammaprint on her biopsy specimen. She may need neoadjuvant chemotherapy and if this were the case she would complete this followed by breast conservation with lumpectomy with targeted lymph node dissection.We would recommend a course of external radiotherapy to the breast and regional nodes followed by antiestrogen therapy. We discussed the risks, benefits, short, and long term effects of radiotherapy, and the patient is interested in proceeding. Dr. Lisbeth Renshaw discusses the delivery and logistics of radiotherapy and anticipates a course of 6 1/2 weeks of radiotherapy. We will see her back about 3-4 weeks after completing chemotherapy if indicated to review this further.   The above documentation reflects my direct findings during this shared patient visit. Please see the separate note by Dr. Lisbeth Renshaw on this date for the remainder of the patient's plan of care.    Carola Rhine, PAC

## 2017-11-01 NOTE — Telephone Encounter (Signed)
Ordered mammaprint (core) per Dr. Jana Hakim.  Faxed requisition to [facility] and confirmed receipt with [facility].

## 2017-11-01 NOTE — H&P (Signed)
Sonia Sandoval Documented: 11/01/2017 7:34 AM Location: [facility] Patient #: [id number] DOB: 1951/04/27 Undefined / Language: Cleophus Molt / Race: White Female  History of Present Illness Sonia Sandoval; 11/01/2017 11:18 AM) Patient words: Pt sent at the request of Dr Lisbeth Renshaw for 6 month hx of right breast mass getting larger. She noticed her right breast shrinking and getting hard for 6 months. No nipple discharge. Left breast is normal. Pt denies CP, SOB or other complaint. Mammogram and U/S show 3 . cm central right breast mass IDC 3 ER+ PR+ her 2 neu negative positive LN as well  no family history.  The patient is a 67 year old female.   Past Surgical History Tawni Pummel, RN; 11/01/2017 7:35 AM) Breast Biopsy Right. Cesarean Section - Multiple Colon Polyp Removal - Colonoscopy Oral Surgery Spinal Surgery - Lower Back  Diagnostic Studies History Tawni Pummel, RN; 11/01/2017 7:35 AM) Colonoscopy 5-10 years ago Mammogram within last year  Medication History Tawni Pummel, RN; 11/01/2017 7:35 AM) Medications Reconciled  Social History Tawni Pummel, RN; 11/01/2017 7:35 AM) Alcohol use Moderate alcohol use. Tobacco use Never smoker.  Family History Tawni Pummel, RN; 11/01/2017 7:35 AM) Heart Disease Family Members In General, Mother. Melanoma Family Members In General. Ovarian Cancer Family Members In General. Respiratory Condition Father. Seizure disorder Son. Thyroid problems Father.  Pregnancy / Birth History Tawni Pummel, RN; 11/01/2017 7:35 AM) Age at menarche 22 years. Age of menopause <46 Contraceptive History Oral contraceptives. Irregular periods Para 4  Other Problems Tawni Pummel, RN; 11/01/2017 7:35 AM) Back Pain Breast Cancer Cholelithiasis Diverticulosis Gastric Ulcer General anesthesia - complications Lump In Breast     Review of Systems [name]; 11/01/2017 7:35 AM) General Present-  Appetite Loss. Not Present- Chills, Fatigue, Fever, Night Sweats, Weight Gain and Weight Loss. Skin Not Present- Change in Wart/Mole, Dryness, Hives, Jaundice, New Lesions, Non-Healing Wounds, Rash and Ulcer. HEENT Present- Seasonal Allergies, Sinus Pain, Visual Disturbances and Wears glasses/contact lenses. Not Present- Earache, Hearing Loss, Hoarseness, Nose Bleed, Oral Ulcers, Ringing in the Ears, Sore Throat and Yellow Eyes. Respiratory Present- Chronic Cough and Snoring. Not Present- Bloody sputum, Difficulty Breathing and Wheezing. Breast Present- Skin Changes. Not Present- Breast Mass, Breast Pain and Nipple Discharge. Cardiovascular Not Present- Chest Pain, Difficulty Breathing Lying Down, Leg Cramps, Palpitations, Rapid Heart Rate, Shortness of Breath and Swelling of Extremities. Gastrointestinal Present- Bloating, Change in Bowel Habits, Excessive gas, Gets full quickly at meals and Hemorrhoids. Not Present- Abdominal Pain, Bloody Stool, Chronic diarrhea, Constipation, Difficulty Swallowing, Indigestion, Nausea, Rectal Pain and Vomiting. Female Genitourinary Not Present- Frequency, Nocturia, Painful Urination, Pelvic Pain and Urgency. Musculoskeletal Present- Joint Stiffness. Not Present- Back Pain, Joint Pain, Muscle Pain, Muscle Weakness and Swelling of Extremities. Neurological Not Present- Decreased Memory, Fainting, Headaches, Numbness, Seizures, Tingling, Tremor, Trouble walking and Weakness. Psychiatric Not Present- Anxiety, Bipolar, Change in Sleep Pattern, Depression, Fearful and Frequent crying. Hematology Present- Easy Bruising. Not Present- Blood Thinners, Excessive bleeding, Gland problems, HIV and Persistent Infections.   Physical Exam (Mayling Aber A. Jullien Granquist Sandoval; 11/01/2017 11:19 AM)  General Mental Status-Alert. General Appearance-Consistent with stated age. Hydration-Well hydrated. Voice-Normal.  Head and Neck Head-normocephalic, atraumatic with no lesions or  palpable masses. Trachea-midline. Thyroid Gland Characteristics - normal size and consistency.  Eye Eyeball - Bilateral-Extraocular movements intact. Sclera/Conjunctiva - Bilateral-No scleral icterus.  Chest and Lung Exam Chest and lung exam reveals -quiet, even and easy respiratory effort with no use of accessory muscles and on auscultation, normal  breath sounds, no adventitious sounds and normal vocal resonance. Inspection Chest Wall - Normal. Back - normal.  Breast Note: large right breast mass with contraction 3 - 4 cm mobile significan distortion left breast normal  Cardiovascular Cardiovascular examination reveals -normal heart sounds, regular rate and rhythm with no murmurs and normal pedal pulses bilaterally.  Neurologic Neurologic evaluation reveals -alert and oriented x 3 with no impairment of recent or remote memory. Mental Status-Normal.  Lymphatic Head & Neck  General Head & Neck Lymphatics: Bilateral - Description - Normal. Axillary  General Axillary Region: Bilateral - Description - Normal. Tenderness - Non Tender.    Assessment & Plan (Leticia Mcdiarmid A. Anvita Hirata Sandoval; 11/01/2017 11:22 AM)  BREAST CANCER, RIGHT (C50.911) Impression: stage 2 mammoprint pending would benefit from neoadjuvant chemotherapy if high risk port placement planned but mammoprontt status could change plan Will need a mastectomy and considering bilateral  needs targeted right axillary LN   Plan on port for now  will revisit if mammoprint low risk  Pt requires port placement for chemotherapy. Risk include bleeding, infection, pneumothorax, hemothorax, mediastinal injury, nerve injury , blood vessel injury, strke, blood clots, death, migration. embolization and need for additional procedures. Pt agrees to proceed.  Current Plans Pt Education - [facility] Use of a central venous catheter for intravenous therapy was discussed. Technique of catheter placement using  ultrasound and fluoroscopy guidance was discussed. Risks such as bleeding, infection, pneumothorax, catheter occlusion, reoperation, and other risks were discussed. I noted a good likelihood this will help address the problem. Questions were answered. The patient expressed understanding & wishes to proceed.

## 2017-11-01 NOTE — Progress Notes (Signed)
START ON PATHWAY REGIMEN - Breast   Doxorubicin + Cyclophosphamide (AC):   A cycle is every 21 days:     Doxorubicin      Cyclophosphamide   **Always confirm dose/schedule in your pharmacy ordering system**    Paclitaxel 80 mg/m2 Weekly:   Administer weekly:     Paclitaxel   **Always confirm dose/schedule in your pharmacy ordering system**    Patient Characteristics: Preoperative or Nonsurgical Candidate (Clinical Staging), Neoadjuvant Therapy followed by Surgery, Invasive Disease, Chemotherapy, HER2 Negative/Unknown/Equivocal, ER Positive Therapeutic Status: Preoperative or Nonsurgical Candidate (Clinical Staging) AJCC M Category: cM0 AJCC Grade: G3 Breast Surgical Plan: Neoadjuvant Therapy followed by Surgery ER Status: Positive (+) AJCC 8 Stage Grouping: IIB HER2 Status: Negative (-) AJCC T Category: cT2 AJCC N Category: cN1 PR Status: Positive (+) Intent of Therapy: Curative Intent, Discussed with Patient 

## 2017-11-01 NOTE — Therapy (Signed)
[place], [place], [address] Phone: [phone]   Fax:  [phone]  Physical Therapy Evaluation  Patient Details  Name: Sonia Sandoval MRN: [id number] Date of Birth: 1951/03/14 Referring Provider: Dr. [name]   Encounter Date: 11/01/2017  PT End of Session - 11/01/17 1313    Visit Number  1    Number of Visits  2    Date for PT Re-Evaluation  12/27/17    PT Start Time  2620    PT Stop Time  1106    PT Time Calculation (min)  27 min    Activity Tolerance  Patient tolerated treatment well    Behavior During Therapy  [facility] for tasks assessed/performed       History reviewed. No pertinent past medical history.  History reviewed. No pertinent surgical history.  There were no vitals filed for this visit.   Subjective Assessment - 11/01/17 1228    Subjective  Patient reports she is here today to be seen by her medical team for her newly diagnosed right breast cancer.    Pertinent History  Patient was diagnosed on 10/19/17 with right invasive ductal carcinoma breast cancer. It measures 3.3 cm and is located in the lower outer quadrant. It is ER/PR positive and HER2 negative with a Ki67 of 10%. She has a known positive axillary lymph node.    Patient Stated Goals  Reduce lymphedema risk and learn post op shoulder ROM HEP    Currently in Pain?  No/denies    Pain Score  --    Pain Location  --    Pain Orientation  --    Pain Descriptors / Indicators  --    Pain Type  --    Pain Onset  --    Pain Frequency  --    Aggravating Factors   --    Pain Relieving Factors  --    Multiple Pain Sites  No         [facility] PT Assessment - 11/01/17 0001      Assessment   Medical Diagnosis  right breast cancer    Referring Provider  Dr. Marcello Moores Cornett    Onset Date/Surgical Date  10/19/17    Hand Dominance  Right    Prior Therapy  None      Precautions   Precautions  Other (comment)    Precaution  Comments  active cancer      Restrictions   Weight Bearing Restrictions  No      Balance Screen   Has the patient fallen in the past 6 months  No    Has the patient had a decrease in activity level because of a fear of falling?   No    Is the patient reluctant to leave their home because of a fear of falling?   No      Home Film/video editor residence    Living Arrangements  Spouse/significant other;Other relatives Husband and mother-in-law    Available Help at Discharge  Family      Prior Function   Level of [place]  Retired    Leisure  She does alot of yardwork      Cognition   Overall Cognitive Status  Within Functional Limits for tasks assessed      Posture/Postural Control   Posture/Postural Control  Postural limitations    Postural Limitations  Rounded Shoulders;Forward head  ROM / Strength   AROM / PROM / Strength  AROM;Strength      AROM   AROM Assessment Site  Shoulder;Cervical    Right/Left Shoulder  Right;Left    Right Shoulder Extension  42 Degrees    Right Shoulder Flexion  159 Degrees    Right Shoulder ABduction  165 Degrees    Right Shoulder Internal Rotation  67 Degrees    Right Shoulder External Rotation  83 Degrees    Left Shoulder Extension  50 Degrees    Left Shoulder Flexion  155 Degrees    Left Shoulder ABduction  165 Degrees    Left Shoulder Internal Rotation  72 Degrees    Left Shoulder External Rotation  82 Degrees    Cervical Flexion  WNL    Cervical Extension  WNL    Cervical - Right Side Bend  WNL    Cervical - Left Side Bend  WNL    Cervical - Right Rotation  WNL    Cervical - Left Rotation  WNL      Strength   Overall Strength  Within functional limits for tasks performed        LYMPHEDEMA/ONCOLOGY QUESTIONNAIRE - 11/01/17 1312      Type   Cancer Type  Right breast cancer      Lymphedema Assessments   Lymphedema Assessments  Upper extremities      Right Upper  Extremity Lymphedema   10 cm Proximal to Olecranon Process  28.5 cm    Olecranon Process  25.4 cm    10 cm Proximal to Ulnar Styloid Process  23 cm    Just Proximal to Ulnar Styloid Process  15.3 cm    Across Hand at [employer]  20 cm    At [place] of 2nd Digit  6.6 cm      Left Upper Extremity Lymphedema   10 cm Proximal to Olecranon Process  26.7 cm    Olecranon Process  24 cm    10 cm Proximal to Ulnar Styloid Process  22 cm    Just Proximal to Ulnar Styloid Process  15.5 cm    Across Hand at [employer]  20.1 cm    At [place] of 2nd Digit  6.3 cm             Objective measurements completed on examination: See above findings.       Patient was instructed today in a home exercise program today for post op shoulder range of motion. These included active assist shoulder flexion in sitting, scapular retraction, wall walking with shoulder abduction, and hands behind head external rotation.  She was encouraged to do these twice a day, holding 3 seconds and repeating 5 times when permitted by her physician.           PT Education - 11/01/17 1312    Education Details  Lymphedema risk reduction and post op shoulder ROM HEP    Person(s) Educated  Patient;Spouse    Methods  Explanation;Handout    Comprehension  Returned demonstration;Verbalized understanding          PT Long Term Goals - 11/01/17 1349      PT LONG TERM GOAL #1   Title  Patient will demonstrate she has returned ot baseline related to shoulder ROM and function post operatively.    Time  8    Period  Weeks    Status  New      Breast Clinic Goals - 11/01/17 1348  Patient will be able to verbalize understanding of pertinent lymphedema risk reduction practices relevant to her diagnosis specifically related to skin care.   Time  1    Period  Days    Status  Achieved      Patient will be able to return demonstrate and/or verbalize understanding of the post-op home exercise program related to  regaining shoulder range of motion.   Time  1    Period  Days    Status  Achieved      Patient will be able to verbalize understanding of the importance of attending the postoperative After Breast Cancer Class for further lymphedema risk reduction education and therapeutic exercise.   Time  1    Period  Days    Status  Achieved            Plan - 11/01/17 1314    Clinical Impression Statement  Patient was diagnosed on 10/19/17 with right invasive ductal carcinoma breast cancer. It measures 3.3 cm and is located in the lower outer quadrant. It is ER/PR positive and HER2 negative with a Ki67 of 10%. She has a known positive axillary lymph node. Her multidisciplinary medical team met prior to her assessments to determine a recommended treatment plan. She is planning to have Mammaprint testing. If high risk, she will undergo neoadjuvant chemotherapy followed by a right lumpectomy versus mastectomy and targeted node dissection. If low risk on Mammaprint, she will go straight to surgery. She will also undergo radiation and anti-estrogen therapy. She will benefit from a post op PT reassessment to determine needs.    History and Personal Factors relevant to plan of care:  None    Clinical Presentation  Evolving    Clinical Presentation due to:  Unknown extent of disease until staging scans are performed.    Clinical Decision Making  Low    Rehab Potential  Excellent    Clinical Impairments Affecting Rehab Potential  None    PT Frequency  -- Eval and 1 f/u visit    PT Treatment/Interventions  ADLs/Self Care Home Management;Therapeutic exercise;Patient/family education    PT Next Visit Plan  Will reassess 3-4 weeks post op     PT Home Exercise Plan  Post op shoulder ROM HEP    Consulted and Agree with Plan of Care  Patient;Family member/caregiver    Family Member Consulted  Husband       Patient will benefit from skilled therapeutic intervention in order to improve the following deficits and  impairments:  Decreased knowledge of precautions, Impaired UE functional use, Postural dysfunction, Pain  Visit Diagnosis: Malignant neoplasm of lower-outer quadrant of right breast of female, estrogen receptor positive ([place]) - Plan: PT plan of care cert/re-cert  Abnormal posture - Plan: PT plan of care cert/re-cert   Patient will follow up at outpatient cancer rehab 3-4 weeks following surgery.  If the patient requires physical therapy at that time, a specific plan will be dictated and sent to the referring physician for approval. The patient was educated today on appropriate basic range of motion exercises to begin post operatively and the importance of attending the After Breast Cancer class following surgery.  Patient was educated today on lymphedema risk reduction practices as it pertains to recommendations that will benefit the patient immediately following surgery.  She verbalized good understanding.      Problem List Patient Active Problem List   Diagnosis Date Noted  . Malignant neoplasm of lower-outer quadrant of right breast of female, estrogen  receptor positive ([place]) 10/27/2017    Annia Friendly, PT 11/01/17 2:25 PM   [place], [place], [address] Phone: [phone]   Fax:  [phone]  Name: LUVINIA LUCY MRN: [id number] Date of Birth: 06/02/1950

## 2017-11-01 NOTE — Telephone Encounter (Signed)
No 7/3 [place], referrals or orders.

## 2017-11-08 ENCOUNTER — Ambulatory Visit: Payer: Self-pay | Admitting: Surgery

## 2017-11-08 ENCOUNTER — Telehealth: Payer: Self-pay | Admitting: *Deleted

## 2017-11-08 DIAGNOSIS — C50911 Malignant neoplasm of unspecified site of right female breast: Secondary | ICD-10-CM

## 2017-11-08 NOTE — Telephone Encounter (Signed)
Spoke with patient to follow up from [facility].  Received mammaprint results of low risk.  Patient aware and will cancel appointments and notify team.

## 2017-11-08 NOTE — Telephone Encounter (Signed)
"  I just received a call about appointments this Friday I know nothing about.  I do not want to miss appointments."  Assured patient she does not have any future appointments.  All previously scheduled appointments have been cancelled.  Automatic appointment reminder is not automatically corrected in real time.  Denies any further questions or needs at this time.

## 2017-11-09 ENCOUNTER — Encounter: Payer: Self-pay | Admitting: Radiation Oncology

## 2017-11-09 ENCOUNTER — Other Ambulatory Visit: Payer: Self-pay | Admitting: Oncology

## 2017-11-09 ENCOUNTER — Other Ambulatory Visit: Payer: Self-pay | Admitting: *Deleted

## 2017-11-09 ENCOUNTER — Encounter (HOSPITAL_COMMUNITY): Payer: Self-pay | Admitting: Oncology

## 2017-11-09 ENCOUNTER — Ambulatory Visit (HOSPITAL_COMMUNITY): Admission: RE | Admit: 2017-11-09 | Payer: Medicare Other | Source: Ambulatory Visit

## 2017-11-09 ENCOUNTER — Other Ambulatory Visit: Payer: Self-pay | Admitting: Surgery

## 2017-11-09 DIAGNOSIS — C50511 Malignant neoplasm of lower-outer quadrant of right female breast: Secondary | ICD-10-CM

## 2017-11-09 DIAGNOSIS — Z170 Estrogen receptor positive status [ER+]: Principal | ICD-10-CM

## 2017-11-09 DIAGNOSIS — C50911 Malignant neoplasm of unspecified site of right female breast: Secondary | ICD-10-CM

## 2017-11-10 ENCOUNTER — Ambulatory Visit (HOSPITAL_COMMUNITY): Payer: Medicare Other

## 2017-11-10 ENCOUNTER — Encounter (HOSPITAL_COMMUNITY): Payer: Medicare Other

## 2017-11-10 ENCOUNTER — Ambulatory Visit: Payer: Medicare Other | Admitting: Oncology

## 2017-11-13 ENCOUNTER — Other Ambulatory Visit: Payer: Medicare Other

## 2017-11-13 ENCOUNTER — Ambulatory Visit: Payer: Medicare Other | Admitting: Oncology

## 2017-11-13 ENCOUNTER — Other Ambulatory Visit (HOSPITAL_COMMUNITY): Payer: Medicare Other

## 2017-11-14 ENCOUNTER — Ambulatory Visit: Payer: Medicare Other

## 2017-11-14 ENCOUNTER — Other Ambulatory Visit: Payer: Medicare Other

## 2017-11-21 ENCOUNTER — Encounter: Payer: Self-pay | Admitting: *Deleted

## 2017-11-21 ENCOUNTER — Telehealth (HOSPITAL_COMMUNITY): Payer: Self-pay | Admitting: Vascular Surgery

## 2017-11-21 NOTE — Telephone Encounter (Signed)
Left pt message to make new brst appt w/ echo in Oct w/ DM OR DB

## 2017-11-27 NOTE — Pre-Procedure Instructions (Signed)
Sonia Sandoval  11/27/2017      [id number] - [place], [place] - [address] [place] [address] Phone: [phone] Fax: [phone]    Your procedure is scheduled on December 05, 2017.  Report to [facility] Admitting at 815 AM.  Call this number if you have problems the morning of surgery:  [phone]   Remember:  Do not eat after midnight.  You may drink clear liquids until 715 AM .  Clear liquids allowed are:    Ensure Pre-Surgery drink    Water, Juice (non-citric and without pulp), Carbonated beverages, Clear Tea, Black Coffee only, Plain Jell-O only, Gatorade and Plain Popsicles only   Please complete your PRE-SURGERY ENSURE that was given to by 715 AM the morning of surgery.  Please, if able, drink it in one setting. DO NOT SIP.    Take these medicines the morning of surgery with A SIP OF WATER (none).   7 days prior to surgery STOP taking any Aspirin (unless otherwise instructed by your surgeon), Aleve, Naproxen, Ibuprofen, Motrin, Advil, Goody's, BC's, all herbal medications, fish oil, and all vitamins    Do not wear jewelry, make-up or nail polish.  Do not wear lotions, powders, or perfumes, or deodorant.  Do not shave 48 hours prior to surgery.    Do not bring valuables to the hospital.  [facility] is not responsible for any belongings or valuables.  Contacts, dentures or bridgework may not be worn into surgery.  Leave your suitcase in the car.  After surgery it may be brought to your room.  For patients admitted to the hospital, discharge time will be determined by your treatment team.  Patients discharged the day of surgery will not be allowed to drive home.    [place]- Preparing For Surgery  Before surgery, you can play an important role. Because skin is not sterile, your skin needs to be as free of germs as possible. You can reduce the number of germs on your skin by  washing with CHG (chlorahexidine gluconate) Soap before surgery.  CHG is an antiseptic cleaner which kills germs and bonds with the skin to continue killing germs even after washing.    Oral Hygiene is also important to reduce your risk of infection.  Remember - BRUSH YOUR TEETH THE MORNING OF SURGERY WITH YOUR REGULAR TOOTHPASTE  Please do not use if you have an allergy to CHG or antibacterial soaps. If your skin becomes reddened/irritated stop using the CHG.  Do not shave (including legs and underarms) for at least 48 hours prior to first CHG shower. It is OK to shave your face.  Please follow these instructions carefully.   1. Shower the NIGHT BEFORE SURGERY and the MORNING OF SURGERY with CHG.   2. If you chose to wash your hair, wash your hair first as usual with your normal shampoo.  3. After you shampoo, rinse your hair and body thoroughly to remove the shampoo.  4. Use CHG as you would any other liquid soap. You can apply CHG directly to the skin and wash gently with a scrungie or a clean washcloth.   5. Apply the CHG Soap to your body ONLY FROM THE NECK DOWN.  Do not use on open wounds or open sores. Avoid contact with your eyes, ears, mouth and genitals (private parts). Wash Face and genitals (private parts)  with your normal  soap.  6. Wash thoroughly, paying special attention to the area where your surgery will be performed.  7. Thoroughly rinse your body with warm water from the neck down.  8. DO NOT shower/wash with your normal soap after using and rinsing off the CHG Soap.  9. Pat yourself dry with a CLEAN TOWEL.  10. Wear CLEAN PAJAMAS to bed the night before surgery, wear comfortable clothes the morning of surgery  11. Place CLEAN SHEETS on your bed the night of your first shower and DO NOT SLEEP WITH PETS.  Day of Surgery:  Do not apply any deodorants/lotions.  Please wear clean clothes to the hospital/surgery center.   Remember to brush your teeth WITH YOUR  REGULAR TOOTHPASTE.   Please read over the following fact sheets that you were given.

## 2017-11-28 ENCOUNTER — Other Ambulatory Visit: Payer: Self-pay

## 2017-11-28 ENCOUNTER — Encounter (HOSPITAL_COMMUNITY): Payer: Self-pay | Admitting: *Deleted

## 2017-11-28 ENCOUNTER — Ambulatory Visit: Payer: Self-pay | Admitting: Surgery

## 2017-11-28 ENCOUNTER — Encounter (HOSPITAL_COMMUNITY)
Admission: RE | Admit: 2017-11-28 | Discharge: 2017-11-28 | Disposition: A | Discharge status: Home / Self Care | Payer: BLUE CROSS/BLUE SHIELD | Source: Ambulatory Visit | Attending: Surgery | Admitting: Surgery

## 2017-11-28 DIAGNOSIS — C50911 Malignant neoplasm of unspecified site of right female breast: Secondary | ICD-10-CM | POA: Insufficient documentation

## 2017-11-28 LAB — COMPREHENSIVE METABOLIC PANEL
ALK PHOS: 50 U/L (ref 38–126)
ALT: 20 U/L (ref 0–44)
AST: 24 U/L (ref 15–41)
Albumin: 4.1 g/dL (ref 3.5–5.0)
Anion gap: 9 (ref 5–15)
BUN: 15 mg/dL (ref 8–23)
CALCIUM: 9.3 mg/dL (ref 8.9–10.3)
CO2: 23 mmol/L (ref 22–32)
Chloride: 108 mmol/L (ref 98–111)
Creatinine, Ser: 0.72 mg/dL (ref 0.44–1.00)
Glucose, Bld: 96 mg/dL (ref 70–99)
Potassium: 3.8 mmol/L (ref 3.5–5.1)
SODIUM: 140 mmol/L (ref 135–145)
Total Bilirubin: 0.8 mg/dL (ref 0.3–1.2)
Total Protein: 6.4 g/dL — ABNORMAL LOW (ref 6.5–8.1)

## 2017-11-28 LAB — CBC WITH DIFFERENTIAL/PLATELET
Abs Immature Granulocytes: 0 10*3/uL (ref 0.0–0.1)
BASOS ABS: 0 10*3/uL (ref 0.0–0.1)
BASOS PCT: 1 %
EOS PCT: 2 %
Eosinophils Absolute: 0.1 10*3/uL (ref 0.0–0.7)
HCT: 41.1 % (ref 36.0–46.0)
Hemoglobin: 13.1 g/dL (ref 12.0–15.0)
Immature Granulocytes: 0 %
LYMPHS PCT: 37 %
Lymphs Abs: 1.4 10*3/uL (ref 0.7–4.0)
MCH: 30.1 pg (ref 26.0–34.0)
MCHC: 31.9 g/dL (ref 30.0–36.0)
MCV: 94.5 fL (ref 78.0–100.0)
Monocytes Absolute: 0.3 10*3/uL (ref 0.1–1.0)
Monocytes Relative: 8 %
Neutro Abs: 2 10*3/uL (ref 1.7–7.7)
Neutrophils Relative %: 52 %
Platelets: 240 10*3/uL (ref 150–400)
RBC: 4.35 MIL/uL (ref 3.87–5.11)
RDW: 12.2 % (ref 11.5–15.5)
WBC: 3.8 10*3/uL — AB (ref 4.0–10.5)

## 2017-11-28 NOTE — H&P (View-Only) (Signed)
Sonia Sandoval Documented: 11/01/2017 7:34 AM Location: [facility] Patient #: [id number] DOB: 05-13-1950 Undefined / Language: Cleophus Molt / Race: White Female  History of Present Illness Marcello Moores A. Monta Maiorana MD; 11/01/2017 11:18 AM) Patient words: Pt sent at the request of Dr Lisbeth Renshaw for 6 month hx of right breast mass getting larger. She noticed her right breast shrinking and getting hard for 6 months. No nipple discharge. Left breast is normal. Pt denies CP, SOB or other complaint. Mammogram and U/S show 3 . cm central right breast mass IDC 3 ER+ PR+ her 2 neu negative positive LN as well  no family history.  The patient is a 67 year old female.   Past Surgical History Tawni Pummel, RN; 11/01/2017 7:35 AM) Breast Biopsy Right. Cesarean Section - Multiple Colon Polyp Removal - Colonoscopy Oral Surgery Spinal Surgery - Lower Back  Diagnostic Studies History Tawni Pummel, RN; 11/01/2017 7:35 AM) Colonoscopy 5-10 years ago Mammogram within last year  Medication History Tawni Pummel, RN; 11/01/2017 7:35 AM) Medications Reconciled  Social History Tawni Pummel, RN; 11/01/2017 7:35 AM) Alcohol use Moderate alcohol use. Tobacco use Never smoker.  Family History Tawni Pummel, RN; 11/01/2017 7:35 AM) Heart Disease Family Members In General, Mother. Melanoma Family Members In General. Ovarian Cancer Family Members In General. Respiratory Condition Father. Seizure disorder Son. Thyroid problems Father.  Pregnancy / Birth History Tawni Pummel, RN; 11/01/2017 7:35 AM) Age at menarche 67 years. Age of menopause <46 Contraceptive History Oral contraceptives. Irregular periods Para 4  Other Problems Tawni Pummel, RN; 11/01/2017 7:35 AM) Back Pain Breast Cancer Cholelithiasis Diverticulosis Gastric Ulcer General anesthesia - complications Lump In Breast     Review of Systems [name]; 11/01/2017 7:35  AM) General Present- Appetite Loss. Not Present- Chills, Fatigue, Fever, Night Sweats, Weight Gain and Weight Loss. Skin Not Present- Change in Wart/Mole, Dryness, Hives, Jaundice, New Lesions, Non-Healing Wounds, Rash and Ulcer. HEENT Present- Seasonal Allergies, Sinus Pain, Visual Disturbances and Wears glasses/contact lenses. Not Present- Earache, Hearing Loss, Hoarseness, Nose Bleed, Oral Ulcers, Ringing in the Ears, Sore Throat and Yellow Eyes. Respiratory Present- Chronic Cough and Snoring. Not Present- Bloody sputum, Difficulty Breathing and Wheezing. Breast Present- Skin Changes. Not Present- Breast Mass, Breast Pain and Nipple Discharge. Cardiovascular Not Present- Chest Pain, Difficulty Breathing Lying Down, Leg Cramps, Palpitations, Rapid Heart Rate, Shortness of Breath and Swelling of Extremities. Gastrointestinal Present- Bloating, Change in Bowel Habits, Excessive gas, Gets full quickly at meals and Hemorrhoids. Not Present- Abdominal Pain, Bloody Stool, Chronic diarrhea, Constipation, Difficulty Swallowing, Indigestion, Nausea, Rectal Pain and Vomiting. Female Genitourinary Not Present- Frequency, Nocturia, Painful Urination, Pelvic Pain and Urgency. Musculoskeletal Present- Joint Stiffness. Not Present- Back Pain, Joint Pain, Muscle Pain, Muscle Weakness and Swelling of Extremities. Neurological Not Present- Decreased Memory, Fainting, Headaches, Numbness, Seizures, Tingling, Tremor, Trouble walking and Weakness. Psychiatric Not Present- Anxiety, Bipolar, Change in Sleep Pattern, Depression, Fearful and Frequent crying. Hematology Present- Easy Bruising. Not Present- Blood Thinners, Excessive bleeding, Gland problems, HIV and Persistent Infections.   Physical Exam (Sydni Elizarraraz A. Huckleberry Martinson MD; 11/01/2017 11:19 AM)  General Mental Status-Alert. General Appearance-Consistent with stated age. Hydration-Well hydrated. Voice-Normal.  Head and Neck Head-normocephalic,  atraumatic with no lesions or palpable masses. Trachea-midline. Thyroid Gland Characteristics - normal size and consistency.  Eye Eyeball - Bilateral-Extraocular movements intact. Sclera/Conjunctiva - Bilateral-No scleral icterus.  Chest and Lung Exam Chest and lung exam reveals -quiet, even and easy respiratory effort with no use of accessory muscles and on auscultation, normal  breath sounds, no adventitious sounds and normal vocal resonance. Inspection Chest Wall - Normal. Back - normal.  Breast Note: large right breast mass with contraction 3 - 4 cm mobile significan distortion left breast normal  Cardiovascular Cardiovascular examination reveals -normal heart sounds, regular rate and rhythm with no murmurs and normal pedal pulses bilaterally.  Neurologic Neurologic evaluation reveals -alert and oriented x 3 with no impairment of recent or remote memory. Mental Status-Normal.  Lymphatic Head & Neck  General Head & Neck Lymphatics: Bilateral - Description - Normal. Axillary  General Axillary Region: Bilateral - Description - Normal. Tenderness - Non Tender.    Assessment & Plan (Angelik Walls A. Keiaira Donlan MD; 11/01/2017 11:22 AM)  BREAST CANCER, RIGHT (C50.911) Impression: stage 2 mammoprint pending would benefit from neoadjuvant chemotherapy if high risk port placement planned but mammoprontt status could change plan Will need a mastectomy and considering bilateral  needs targeted right axillary LN   LOW RISK DESIRES BILATERAL MASTECTOMY  TARGETED R ALN biopsy and SLN mapping  The surgical and non surgical options have been discussed with the patient.  Risks of surgery include bleeding,  Infection,  Flap necrosis,  Tissue loss,  Chronic pain, death, Numbness,  And the need for additional procedures.  Reconstruction options also have been discussed with the patient as well.  The patient agrees to proceed.   Sentinel lymph node mapping and  dissection has been discussed with the patient.  Risk of bleeding,  Infection,  Seroma formation,  Additional procedures,,  Shoulder weakness ,  Shoulder stiffness,  Nerve and blood vessel injury and reaction to the mapping dyes have been discussed.  Alternatives to surgery have been discussed with the patient.  The patient agrees to proceed.

## 2017-11-28 NOTE — H&P (Signed)
Sonia Sandoval Documented: 11/01/2017 7:34 AM Location: [facility] Patient #: [id number] DOB: January 24, 1951 Undefined / Language: Sonia Sandoval / Race: White Female  History of Present Illness Sonia Moores A. Sonia Obar MD; 11/01/2017 11:18 AM) Patient words: Pt sent at the request of Dr Sonia Sandoval for 6 month hx of right breast mass getting larger. She noticed her right breast shrinking and getting hard for 6 months. No nipple discharge. Left breast is normal. Pt denies CP, SOB or other complaint. Mammogram and U/S show 3 . cm central right breast mass IDC 3 ER+ PR+ her 2 neu negative positive LN as well  no family history.  The patient is a 67 year old female.   Past Surgical History Sonia Pummel, RN; 11/01/2017 7:35 AM) Breast Biopsy Right. Cesarean Section - Multiple Colon Polyp Removal - Colonoscopy Oral Surgery Spinal Surgery - Lower Back  Diagnostic Studies History Sonia Pummel, RN; 11/01/2017 7:35 AM) Colonoscopy 5-10 years ago Mammogram within last year  Medication History Sonia Pummel, RN; 11/01/2017 7:35 AM) Medications Reconciled  Social History Sonia Pummel, RN; 11/01/2017 7:35 AM) Alcohol use Moderate alcohol use. Tobacco use Never smoker.  Family History Sonia Pummel, RN; 11/01/2017 7:35 AM) Heart Disease Family Members In General, Mother. Melanoma Family Members In General. Ovarian Cancer Family Members In General. Respiratory Condition Father. Seizure disorder Son. Thyroid problems Father.  Pregnancy / Birth History Sonia Pummel, RN; 11/01/2017 7:35 AM) Age at menarche 15 years. Age of menopause <46 Contraceptive History Oral contraceptives. Irregular periods Para 4  Other Problems Sonia Pummel, RN; 11/01/2017 7:35 AM) Back Pain Breast Cancer Cholelithiasis Diverticulosis Gastric Ulcer General anesthesia - complications Lump In Breast     Review of Systems Sonia Spillers Ledford RN; 11/01/2017 7:35  AM) General Present- Appetite Loss. Not Present- Chills, Fatigue, Fever, Night Sweats, Weight Gain and Weight Loss. Skin Not Present- Change in Wart/Mole, Dryness, Hives, Jaundice, New Lesions, Non-Healing Wounds, Rash and Ulcer. HEENT Present- Seasonal Allergies, Sinus Pain, Visual Disturbances and Wears glasses/contact lenses. Not Present- Earache, Hearing Loss, Hoarseness, Nose Bleed, Oral Ulcers, Ringing in the Ears, Sore Throat and Yellow Eyes. Respiratory Present- Chronic Cough and Snoring. Not Present- Bloody sputum, Difficulty Breathing and Wheezing. Breast Present- Skin Changes. Not Present- Breast Mass, Breast Pain and Nipple Discharge. Cardiovascular Not Present- Chest Pain, Difficulty Breathing Lying Down, Leg Cramps, Palpitations, Rapid Heart Rate, Shortness of Breath and Swelling of Extremities. Gastrointestinal Present- Bloating, Change in Bowel Habits, Excessive gas, Gets full quickly at meals and Hemorrhoids. Not Present- Abdominal Pain, Bloody Stool, Chronic diarrhea, Constipation, Difficulty Swallowing, Indigestion, Nausea, Rectal Pain and Vomiting. Female Genitourinary Not Present- Frequency, Nocturia, Painful Urination, Pelvic Pain and Urgency. Musculoskeletal Present- Joint Stiffness. Not Present- Back Pain, Joint Pain, Muscle Pain, Muscle Weakness and Swelling of Extremities. Neurological Not Present- Decreased Memory, Fainting, Headaches, Numbness, Seizures, Tingling, Tremor, Trouble walking and Weakness. Psychiatric Not Present- Anxiety, Bipolar, Change in Sleep Pattern, Depression, Fearful and Frequent crying. Hematology Present- Easy Bruising. Not Present- Blood Thinners, Excessive bleeding, Gland problems, HIV and Persistent Infections.   Physical Exam (Sonia Sandoval A. Sonia Viglione MD; 11/01/2017 11:19 AM)  General Mental Status-Alert. General Appearance-Consistent with stated age. Hydration-Well hydrated. Voice-Normal.  Head and Neck Head-normocephalic,  atraumatic with no lesions or palpable masses. Trachea-midline. Thyroid Gland Characteristics - normal size and consistency.  Eye Eyeball - Bilateral-Extraocular movements intact. Sclera/Conjunctiva - Bilateral-No scleral icterus.  Chest and Lung Exam Chest and lung exam reveals -quiet, even and easy respiratory effort with no use of accessory muscles and on auscultation, normal  breath sounds, no adventitious sounds and normal vocal resonance. Inspection Chest Wall - Normal. Back - normal.  Breast Note: large right breast mass with contraction 3 - 4 cm mobile significan distortion left breast normal  Cardiovascular Cardiovascular examination reveals -normal heart sounds, regular rate and rhythm with no murmurs and normal pedal pulses bilaterally.  Neurologic Neurologic evaluation reveals -alert and oriented x 3 with no impairment of recent or remote memory. Mental Status-Normal.  Lymphatic Head & Neck  General Head & Neck Lymphatics: Bilateral - Description - Normal. Axillary  General Axillary Region: Bilateral - Description - Normal. Tenderness - Non Tender.    Assessment & Plan (Sonia Sandoval A. Sonia Kasik MD; 11/01/2017 11:22 AM)  BREAST CANCER, RIGHT (C50.911) Impression: stage 2 mammoprint pending would benefit from neoadjuvant chemotherapy if high risk port placement planned but mammoprontt status could change plan Will need a mastectomy and considering bilateral  needs targeted right axillary LN   LOW RISK DESIRES BILATERAL MASTECTOMY  TARGETED R ALN biopsy and SLN mapping  The surgical and non surgical options have been discussed with the patient.  Risks of surgery include bleeding,  Infection,  Flap necrosis,  Tissue loss,  Chronic pain, death, Numbness,  And the need for additional procedures.  Reconstruction options also have been discussed with the patient as well.  The patient agrees to proceed.   Sentinel lymph node mapping and  dissection has been discussed with the patient.  Risk of bleeding,  Infection,  Seroma formation,  Additional procedures,,  Shoulder weakness ,  Shoulder stiffness,  Nerve and blood vessel injury and reaction to the mapping dyes have been discussed.  Alternatives to surgery have been discussed with the patient.  The patient agrees to proceed.

## 2017-11-28 NOTE — Progress Notes (Signed)
Spoke with nurse at [facility] regarding needing clarification on consent order.  Patient stated she had BX, and has appt to have seed placed.  Will need to sign consent dos.

## 2017-12-04 ENCOUNTER — Ambulatory Visit
Admission: RE | Admit: 2017-12-04 | Discharge: 2017-12-04 | Disposition: A | Discharge status: Home / Self Care | Payer: BLUE CROSS/BLUE SHIELD | Source: Ambulatory Visit | Attending: Surgery | Admitting: Surgery

## 2017-12-04 DIAGNOSIS — C50911 Malignant neoplasm of unspecified site of right female breast: Secondary | ICD-10-CM

## 2017-12-04 MED ORDER — CEFAZOLIN SODIUM-DEXTROSE 2-4 GM/100ML-% IV SOLN
2.0000 g | INTRAVENOUS | Status: AC
  Administered 2017-12-05: 2 g via INTRAVENOUS
  Filled 2017-12-04: qty 100

## 2017-12-04 MED ORDER — ACETAMINOPHEN 500 MG PO TABS
1000.0000 mg | ORAL_TABLET | ORAL | Status: AC
  Administered 2017-12-05: 1000 mg via ORAL
  Filled 2017-12-04: qty 2

## 2017-12-04 MED ORDER — CELECOXIB 200 MG PO CAPS
200.0000 mg | ORAL_CAPSULE | ORAL | Status: AC
  Administered 2017-12-05: 200 mg via ORAL
  Filled 2017-12-04: qty 1

## 2017-12-04 MED ORDER — GABAPENTIN 300 MG PO CAPS
300.0000 mg | ORAL_CAPSULE | ORAL | Status: AC
  Administered 2017-12-05: 300 mg via ORAL
  Filled 2017-12-04: qty 1

## 2017-12-05 ENCOUNTER — Ambulatory Visit
Admission: RE | Admit: 2017-12-05 | Discharge: 2017-12-05 | Disposition: A | Discharge status: Home / Self Care | Payer: BLUE CROSS/BLUE SHIELD | Source: Ambulatory Visit | Attending: Surgery | Admitting: Surgery

## 2017-12-05 ENCOUNTER — Encounter (HOSPITAL_COMMUNITY): Payer: Self-pay | Admitting: Anesthesiology

## 2017-12-05 ENCOUNTER — Other Ambulatory Visit: Payer: Self-pay

## 2017-12-05 ENCOUNTER — Ambulatory Visit (HOSPITAL_COMMUNITY): Payer: BLUE CROSS/BLUE SHIELD | Admitting: Anesthesiology

## 2017-12-05 ENCOUNTER — Observation Stay (HOSPITAL_COMMUNITY)
Admission: RE | Admit: 2017-12-05 | Discharge: 2017-12-06 | Disposition: A | Discharge status: Home / Self Care | Payer: BLUE CROSS/BLUE SHIELD | Source: Ambulatory Visit | Attending: Surgery | Admitting: Surgery

## 2017-12-05 ENCOUNTER — Encounter (HOSPITAL_COMMUNITY)
Admission: RE | Admit: 2017-12-05 | Discharge: 2017-12-05 | Disposition: A | Discharge status: Home / Self Care | Payer: BLUE CROSS/BLUE SHIELD | Source: Ambulatory Visit | Attending: Surgery | Admitting: Surgery

## 2017-12-05 ENCOUNTER — Encounter (HOSPITAL_COMMUNITY): Admission: RE | Disposition: A | Discharge status: Home / Self Care | Payer: Self-pay | Source: Ambulatory Visit

## 2017-12-05 DIAGNOSIS — C773 Secondary and unspecified malignant neoplasm of axilla and upper limb lymph nodes: Secondary | ICD-10-CM | POA: Diagnosis not present

## 2017-12-05 DIAGNOSIS — C50511 Malignant neoplasm of lower-outer quadrant of right female breast: Secondary | ICD-10-CM | POA: Diagnosis present

## 2017-12-05 DIAGNOSIS — C50911 Malignant neoplasm of unspecified site of right female breast: Secondary | ICD-10-CM

## 2017-12-05 DIAGNOSIS — N6012 Diffuse cystic mastopathy of left breast: Secondary | ICD-10-CM | POA: Insufficient documentation

## 2017-12-05 DIAGNOSIS — Z4001 Encounter for prophylactic removal of breast: Secondary | ICD-10-CM | POA: Diagnosis not present

## 2017-12-05 DIAGNOSIS — D241 Benign neoplasm of right breast: Secondary | ICD-10-CM | POA: Diagnosis not present

## 2017-12-05 DIAGNOSIS — Z8249 Family history of ischemic heart disease and other diseases of the circulatory system: Secondary | ICD-10-CM | POA: Insufficient documentation

## 2017-12-05 DIAGNOSIS — C799 Secondary malignant neoplasm of unspecified site: Secondary | ICD-10-CM | POA: Insufficient documentation

## 2017-12-05 DIAGNOSIS — Z885 Allergy status to narcotic agent status: Secondary | ICD-10-CM | POA: Insufficient documentation

## 2017-12-05 DIAGNOSIS — Z79899 Other long term (current) drug therapy: Secondary | ICD-10-CM | POA: Diagnosis not present

## 2017-12-05 DIAGNOSIS — Z86010 Personal history of colonic polyps: Secondary | ICD-10-CM | POA: Diagnosis not present

## 2017-12-05 DIAGNOSIS — Z170 Estrogen receptor positive status [ER+]: Secondary | ICD-10-CM | POA: Diagnosis not present

## 2017-12-05 DIAGNOSIS — Z8719 Personal history of other diseases of the digestive system: Secondary | ICD-10-CM | POA: Insufficient documentation

## 2017-12-05 DIAGNOSIS — N6011 Diffuse cystic mastopathy of right breast: Secondary | ICD-10-CM | POA: Diagnosis not present

## 2017-12-05 HISTORY — DX: Other specified postprocedural states: Z98.890

## 2017-12-05 HISTORY — DX: Other specified postprocedural states: R11.2

## 2017-12-05 HISTORY — PX: MASTECTOMY: SHX3

## 2017-12-05 HISTORY — PX: MASTECTOMY COMPLETE / SIMPLE W/ SENTINEL NODE BIOPSY: SUR846

## 2017-12-05 HISTORY — DX: Malignant neoplasm of unspecified site of right female breast: C50.911

## 2017-12-05 HISTORY — PX: MASTECTOMY WITH RADIOACTIVE SEED GUIDED EXCISION AND AXILLARY SENTINEL LYMPH NODE BIOPSY: SHX6736

## 2017-12-05 SURGERY — MASTECTOMY WITH RADIOACTIVE SEED GUIDED EXCISION AND AXILLARY SENTINEL LYMPH NODE BIOPSY
Anesthesia: General | Site: Breast | Laterality: Bilateral

## 2017-12-05 MED ORDER — ACETAMINOPHEN 500 MG PO TABS
1000.0000 mg | ORAL_TABLET | Freq: Four times a day (QID) | ORAL | Status: DC
  Administered 2017-12-06: 1000 mg via ORAL
  Filled 2017-12-05 (×4): qty 2

## 2017-12-05 MED ORDER — MIDAZOLAM HCL 2 MG/2ML IJ SOLN
2.0000 mg | Freq: Once | INTRAMUSCULAR | Status: AC
  Administered 2017-12-05: 2 mg via INTRAVENOUS

## 2017-12-05 MED ORDER — SUGAMMADEX SODIUM 200 MG/2ML IV SOLN
INTRAVENOUS | Status: AC
  Filled 2017-12-05: qty 2

## 2017-12-05 MED ORDER — OXYCODONE HCL 5 MG PO TABS
5.0000 mg | ORAL_TABLET | Freq: Once | ORAL | Status: AC | PRN
  Administered 2017-12-05: 5 mg via ORAL

## 2017-12-05 MED ORDER — SODIUM CHLORIDE 0.9 % IJ SOLN
INTRAMUSCULAR | Status: AC
  Filled 2017-12-05: qty 10

## 2017-12-05 MED ORDER — EPHEDRINE SULFATE 50 MG/ML IJ SOLN
INTRAMUSCULAR | Status: DC | PRN
  Administered 2017-12-05 (×4): 10 mg via INTRAVENOUS

## 2017-12-05 MED ORDER — KETAMINE HCL 50 MG/5ML IJ SOSY
PREFILLED_SYRINGE | INTRAMUSCULAR | Status: AC
  Filled 2017-12-05: qty 5

## 2017-12-05 MED ORDER — DIPHENHYDRAMINE HCL 50 MG/ML IJ SOLN: 12.5000 mg | Freq: Four times a day (QID) | INTRAMUSCULAR | Status: DC | PRN

## 2017-12-05 MED ORDER — SIMETHICONE 80 MG PO CHEW: 40.0000 mg | CHEWABLE_TABLET | Freq: Four times a day (QID) | ORAL | Status: DC | PRN

## 2017-12-05 MED ORDER — KETAMINE HCL 10 MG/ML IJ SOLN
INTRAMUSCULAR | Status: DC | PRN
  Administered 2017-12-05: 30 mg via INTRAVENOUS

## 2017-12-05 MED ORDER — DIPHENHYDRAMINE HCL 12.5 MG/5ML PO ELIX: 12.5000 mg | ORAL_SOLUTION | Freq: Four times a day (QID) | ORAL | Status: DC | PRN

## 2017-12-05 MED ORDER — LIDOCAINE 2% (20 MG/ML) 5 ML SYRINGE
INTRAMUSCULAR | Status: DC | PRN
  Administered 2017-12-05: 100 mg via INTRAVENOUS

## 2017-12-05 MED ORDER — 0.9 % SODIUM CHLORIDE (POUR BTL) OPTIME
TOPICAL | Status: DC | PRN
  Administered 2017-12-05: 1000 mL

## 2017-12-05 MED ORDER — FENTANYL CITRATE (PF) 100 MCG/2ML IJ SOLN
INTRAMUSCULAR | Status: DC | PRN
  Administered 2017-12-05: 100 ug via INTRAVENOUS

## 2017-12-05 MED ORDER — TECHNETIUM TC 99M SULFUR COLLOID FILTERED
1.0000 | Freq: Once | INTRAVENOUS | Status: AC | PRN
  Administered 2017-12-05: 1 via INTRADERMAL

## 2017-12-05 MED ORDER — METOPROLOL TARTRATE 5 MG/5ML IV SOLN: 5.0000 mg | Freq: Four times a day (QID) | INTRAVENOUS | Status: DC | PRN

## 2017-12-05 MED ORDER — LACTATED RINGERS IV SOLN
INTRAVENOUS | Status: DC
  Administered 2017-12-05: 10 mL/h via INTRAVENOUS

## 2017-12-05 MED ORDER — FENTANYL CITRATE (PF) 250 MCG/5ML IJ SOLN
INTRAMUSCULAR | Status: AC
  Filled 2017-12-05: qty 5

## 2017-12-05 MED ORDER — PROMETHAZINE HCL 25 MG/ML IJ SOLN
12.5000 mg | Freq: Four times a day (QID) | INTRAMUSCULAR | Status: DC | PRN
  Administered 2017-12-05: 12.5 mg via INTRAVENOUS
  Filled 2017-12-05: qty 1

## 2017-12-05 MED ORDER — METHYLENE BLUE 0.5 % INJ SOLN
INTRAVENOUS | Status: AC
  Filled 2017-12-05: qty 10

## 2017-12-05 MED ORDER — CEFAZOLIN SODIUM-DEXTROSE 2-4 GM/100ML-% IV SOLN
2.0000 g | Freq: Three times a day (TID) | INTRAVENOUS | Status: AC
  Administered 2017-12-05: 2 g via INTRAVENOUS
  Filled 2017-12-05: qty 100

## 2017-12-05 MED ORDER — GABAPENTIN 300 MG PO CAPS
300.0000 mg | ORAL_CAPSULE | Freq: Two times a day (BID) | ORAL | Status: DC
  Administered 2017-12-05 (×2): 300 mg via ORAL
  Filled 2017-12-05 (×2): qty 1

## 2017-12-05 MED ORDER — SODIUM CHLORIDE 0.9 % IV SOLN
INTRAVENOUS | Status: DC | PRN
  Administered 2017-12-05: 25 ug/min via INTRAVENOUS

## 2017-12-05 MED ORDER — SUGAMMADEX SODIUM 200 MG/2ML IV SOLN
INTRAVENOUS | Status: DC | PRN
Start: 2017-12-05 — End: 2017-12-05
  Administered 2017-12-05: 131.8 mg via INTRAVENOUS

## 2017-12-05 MED ORDER — METHOCARBAMOL 500 MG PO TABS: 500.0000 mg | ORAL_TABLET | Freq: Four times a day (QID) | ORAL | Status: DC | PRN

## 2017-12-05 MED ORDER — ONDANSETRON HCL 4 MG/2ML IJ SOLN
INTRAMUSCULAR | Status: DC | PRN
Start: 2017-12-05 — End: 2017-12-05
  Administered 2017-12-05: 4 mg via INTRAVENOUS

## 2017-12-05 MED ORDER — OXYCODONE HCL 5 MG PO TABS: 5.0000 mg | ORAL_TABLET | ORAL | Status: DC | PRN

## 2017-12-05 MED ORDER — ONDANSETRON HCL 4 MG/2ML IJ SOLN
4.0000 mg | Freq: Four times a day (QID) | INTRAMUSCULAR | Status: DC | PRN
  Administered 2017-12-05: 4 mg via INTRAVENOUS
  Filled 2017-12-05: qty 2

## 2017-12-05 MED ORDER — OXYCODONE HCL 5 MG/5ML PO SOLN: 5.0000 mg | Freq: Once | ORAL | Status: AC | PRN

## 2017-12-05 MED ORDER — SODIUM CHLORIDE 0.9 % IJ SOLN
INTRAMUSCULAR | Status: DC | PRN
  Administered 2017-12-05: 11:00:00 via INTRAMUSCULAR

## 2017-12-05 MED ORDER — ONDANSETRON 4 MG PO TBDP: 4.0000 mg | ORAL_TABLET | Freq: Four times a day (QID) | ORAL | Status: DC | PRN

## 2017-12-05 MED ORDER — ENOXAPARIN SODIUM 40 MG/0.4ML ~~LOC~~ SOLN: 40.0000 mg | SUBCUTANEOUS | Status: DC

## 2017-12-05 MED ORDER — CELECOXIB 200 MG PO CAPS
ORAL_CAPSULE | ORAL | Status: AC
  Filled 2017-12-05: qty 1

## 2017-12-05 MED ORDER — BUPIVACAINE-EPINEPHRINE (PF) 0.25% -1:200000 IJ SOLN
INTRAMUSCULAR | Status: DC | PRN
  Administered 2017-12-05 (×2): 30 mL

## 2017-12-05 MED ORDER — FENTANYL CITRATE (PF) 100 MCG/2ML IJ SOLN: 25.0000 ug | INTRAMUSCULAR | Status: DC | PRN

## 2017-12-05 MED ORDER — CELECOXIB 200 MG PO CAPS
200.0000 mg | ORAL_CAPSULE | Freq: Two times a day (BID) | ORAL | Status: DC
  Administered 2017-12-05 (×2): 200 mg via ORAL
  Filled 2017-12-05 (×2): qty 1

## 2017-12-05 MED ORDER — KETOROLAC TROMETHAMINE 15 MG/ML IJ SOLN: 15.0000 mg | Freq: Four times a day (QID) | INTRAMUSCULAR | Status: DC | PRN

## 2017-12-05 MED ORDER — HEMOSTATIC AGENTS (NO CHARGE) OPTIME
TOPICAL | Status: DC | PRN
  Administered 2017-12-05: 1 via TOPICAL

## 2017-12-05 MED ORDER — PHENYLEPHRINE 40 MCG/ML (10ML) SYRINGE FOR IV PUSH (FOR BLOOD PRESSURE SUPPORT)
PREFILLED_SYRINGE | INTRAVENOUS | Status: DC | PRN
  Administered 2017-12-05: 160 ug via INTRAVENOUS
  Administered 2017-12-05: 80 ug via INTRAVENOUS
  Administered 2017-12-05 (×2): 160 ug via INTRAVENOUS
  Administered 2017-12-05: 80 ug via INTRAVENOUS

## 2017-12-05 MED ORDER — HYDRALAZINE HCL 20 MG/ML IJ SOLN: 10.0000 mg | INTRAMUSCULAR | Status: DC | PRN

## 2017-12-05 MED ORDER — ROCURONIUM BROMIDE 10 MG/ML (PF) SYRINGE
PREFILLED_SYRINGE | INTRAVENOUS | Status: DC | PRN
  Administered 2017-12-05: 50 mg via INTRAVENOUS

## 2017-12-05 MED ORDER — MIDAZOLAM HCL 5 MG/5ML IJ SOLN
INTRAMUSCULAR | Status: DC | PRN
  Administered 2017-12-05: 1 mg via INTRAVENOUS

## 2017-12-05 MED ORDER — FENTANYL CITRATE (PF) 100 MCG/2ML IJ SOLN
50.0000 ug | Freq: Once | INTRAMUSCULAR | Status: AC
  Administered 2017-12-05: 50 ug via INTRAVENOUS

## 2017-12-05 MED ORDER — MIDAZOLAM HCL 2 MG/2ML IJ SOLN
INTRAMUSCULAR | Status: AC
  Filled 2017-12-05: qty 2

## 2017-12-05 MED ORDER — OXYCODONE HCL 5 MG PO TABS
ORAL_TABLET | ORAL | Status: AC
  Filled 2017-12-05: qty 1

## 2017-12-05 MED ORDER — HYDROMORPHONE HCL 1 MG/ML IJ SOLN
1.0000 mg | INTRAMUSCULAR | Status: DC | PRN
  Administered 2017-12-05: 1 mg via INTRAVENOUS
  Filled 2017-12-05: qty 1

## 2017-12-05 MED ORDER — DEXAMETHASONE SODIUM PHOSPHATE 10 MG/ML IJ SOLN
INTRAMUSCULAR | Status: DC | PRN
  Administered 2017-12-05: 10 mg via INTRAVENOUS

## 2017-12-05 MED ORDER — CHLORHEXIDINE GLUCONATE CLOTH 2 % EX PADS: 6.0000 | MEDICATED_PAD | Freq: Once | CUTANEOUS | Status: DC

## 2017-12-05 MED ORDER — FENTANYL CITRATE (PF) 100 MCG/2ML IJ SOLN
INTRAMUSCULAR | Status: AC
  Filled 2017-12-05: qty 2

## 2017-12-05 MED ORDER — ONDANSETRON HCL 4 MG/2ML IJ SOLN
INTRAMUSCULAR | Status: AC
  Filled 2017-12-05: qty 2

## 2017-12-05 MED ORDER — ONDANSETRON HCL 4 MG/2ML IJ SOLN: 4.0000 mg | Freq: Once | INTRAMUSCULAR | Status: DC | PRN

## 2017-12-05 MED ORDER — KCL IN DEXTROSE-NACL 20-5-0.9 MEQ/L-%-% IV SOLN
INTRAVENOUS | Status: DC
  Administered 2017-12-05: 18:00:00 via INTRAVENOUS
  Filled 2017-12-05 (×3): qty 1000

## 2017-12-05 MED ORDER — PROPOFOL 10 MG/ML IV BOLUS
INTRAVENOUS | Status: AC
  Filled 2017-12-05: qty 20

## 2017-12-05 MED ORDER — ROCURONIUM BROMIDE 10 MG/ML (PF) SYRINGE
PREFILLED_SYRINGE | INTRAVENOUS | Status: AC
  Filled 2017-12-05: qty 10

## 2017-12-05 MED ORDER — PROPOFOL 10 MG/ML IV BOLUS
INTRAVENOUS | Status: DC | PRN
  Administered 2017-12-05: 100 mg via INTRAVENOUS

## 2017-12-05 SURGICAL SUPPLY — 53 items
ADH SKN CLS APL DERMABOND .7 (GAUZE/BANDAGES/DRESSINGS) ×3
APPLIER CLIP 9.375 MED OPEN (MISCELLANEOUS) ×15
APR CLP MED 9.3 20 MLT OPN (MISCELLANEOUS) ×5
BINDER BREAST LRG (GAUZE/BANDAGES/DRESSINGS) ×2 IMPLANT
BINDER BREAST XLRG (GAUZE/BANDAGES/DRESSINGS) IMPLANT
BIOPATCH RED 1 DISK 7.0 (GAUZE/BANDAGES/DRESSINGS) ×5 IMPLANT
BIOPATCH RED 1IN DISK 7.0MM (GAUZE/BANDAGES/DRESSINGS) ×4
CANISTER SUCT 3000ML PPV (MISCELLANEOUS) ×3 IMPLANT
CHLORAPREP W/TINT 26ML (MISCELLANEOUS) ×5 IMPLANT
CLIP APPLIE 9.375 MED OPEN (MISCELLANEOUS) ×1 IMPLANT
COVER SURGICAL LIGHT HANDLE (MISCELLANEOUS) ×3 IMPLANT
COVER TRANSDUCER ULTRASND GEL (DRAPE) ×3 IMPLANT
DERMABOND ADVANCED (GAUZE/BANDAGES/DRESSINGS) ×6
DERMABOND ADVANCED .7 DNX12 (GAUZE/BANDAGES/DRESSINGS) ×1 IMPLANT
DEVICE DUBIN SPECIMEN MAMMOGRA (MISCELLANEOUS) ×3 IMPLANT
DRAIN CHANNEL 19F RND (DRAIN) ×9 IMPLANT
DRAPE CHEST BREAST 15X10 FENES (DRAPES) ×3 IMPLANT
DRSG PAD ABDOMINAL 8X10 ST (GAUZE/BANDAGES/DRESSINGS) ×10 IMPLANT
DRSG TEGADERM 4X4.75 (GAUZE/BANDAGES/DRESSINGS) ×8 IMPLANT
ELECT BLADE 4.0 EZ CLEAN MEGAD (MISCELLANEOUS) ×3
ELECT CAUTERY BLADE 6.4 (BLADE) ×3 IMPLANT
ELECT REM PT RETURN 9FT ADLT (ELECTROSURGICAL) ×3
ELECTRODE BLDE 4.0 EZ CLN MEGD (MISCELLANEOUS) ×1 IMPLANT
ELECTRODE REM PT RTRN 9FT ADLT (ELECTROSURGICAL) ×1 IMPLANT
EVACUATOR SILICONE 100CC (DRAIN) ×9 IMPLANT
GLOVE BIO SURGEON STRL SZ8 (GLOVE) ×5 IMPLANT
GLOVE BIOGEL PI IND STRL 8 (GLOVE) ×1 IMPLANT
GLOVE BIOGEL PI INDICATOR 8 (GLOVE) ×2
GOWN STRL REUS W/ TWL LRG LVL3 (GOWN DISPOSABLE) ×1 IMPLANT
GOWN STRL REUS W/ TWL XL LVL3 (GOWN DISPOSABLE) ×1 IMPLANT
GOWN STRL REUS W/TWL LRG LVL3 (GOWN DISPOSABLE) ×3
GOWN STRL REUS W/TWL XL LVL3 (GOWN DISPOSABLE) ×3
HEMOSTAT ARISTA ABSORB 3G PWDR (MISCELLANEOUS) ×2 IMPLANT
KIT BASIN OR (CUSTOM PROCEDURE TRAY) ×3 IMPLANT
KIT MARKER MARGIN INK (KITS) ×3 IMPLANT
KIT TURNOVER KIT B (KITS) ×3 IMPLANT
MARKER SKIN DUAL TIP RULER LAB (MISCELLANEOUS) ×3 IMPLANT
NDL FILTER BLUNT 18X1 1/2 (NEEDLE) IMPLANT
NDL HYPO 25GX1X1/2 BEV (NEEDLE) IMPLANT
NEEDLE FILTER BLUNT 18X 1/2SAF (NEEDLE) ×2
NEEDLE FILTER BLUNT 18X1 1/2 (NEEDLE) ×1 IMPLANT
NEEDLE HYPO 25GX1X1/2 BEV (NEEDLE) ×3 IMPLANT
NS IRRIG 1000ML POUR BTL (IV SOLUTION) ×3 IMPLANT
PACK GENERAL/GYN (CUSTOM PROCEDURE TRAY) ×3 IMPLANT
PAD ARMBOARD 7.5X6 YLW CONV (MISCELLANEOUS) ×3 IMPLANT
SPONGE LAP 18X18 X RAY DECT (DISPOSABLE) ×4 IMPLANT
SUT ETHILON 2 0 FS 18 (SUTURE) ×7 IMPLANT
SUT MNCRL AB 4-0 PS2 18 (SUTURE) ×5 IMPLANT
SUT SILK 2 0 SH (SUTURE) ×2 IMPLANT
SUT VIC AB 3-0 SH 18 (SUTURE) ×9 IMPLANT
SYR CONTROL 10ML LL (SYRINGE) ×2 IMPLANT
TOWEL OR 17X24 6PK STRL BLUE (TOWEL DISPOSABLE) ×3 IMPLANT
TOWEL OR 17X26 10 PK STRL BLUE (TOWEL DISPOSABLE) ×3 IMPLANT

## 2017-12-05 NOTE — Anesthesia Procedure Notes (Signed)
Anesthesia Regional Block: Pectoralis block   Pre-Anesthetic Checklist: ,, timeout performed, Correct Patient, Correct Site, Correct Laterality, Correct Procedure, Correct Position, risks and benefits discussed, surgical consent, pre-op evaluation,  At surgeon's request and post-op pain management  Laterality: Right  Prep: chloraprep       Needles:  Injection technique: Single-shot  Needle Type: Echogenic Needle     Needle Length: 10cm  Needle Gauge: 21     Additional Needles:   Narrative:  Start time: 12/05/2017 9:12 AM End time: 12/05/2017 9:16 AM Injection made incrementally with aspirations every 5 mL.  Performed by: Personally  Anesthesiologist: [name], MD  Additional Notes: No pain on injection. No increased resistance to injection. Injection made in 5cc increments. Good needle visualization. Patient tolerated the procedure well.

## 2017-12-05 NOTE — Discharge Instructions (Signed)
[facility] °[phone] ° °MASTECTOMY: POST OP INSTRUCTIONS ° °Always review your discharge instruction sheet given to you by the facility where your surgery was performed. °IF YOU HAVE DISABILITY OR FAMILY LEAVE FORMS, YOU MUST BRING THEM TO THE OFFICE FOR PROCESSING.   °DO NOT GIVE THEM TO YOUR DOCTOR. °A prescription for pain medication may be given to you upon discharge.  Take your pain medication as prescribed, if needed.  If narcotic pain medicine is not needed, then you may take acetaminophen (Tylenol) or ibuprofen (Advil) as needed. °1. Take your usually prescribed medications unless otherwise directed. °2. If you need a refill on your pain medication, please contact your pharmacy.  They will contact our office to request authorization.  Prescriptions will not be filled after 5pm or on week-ends. °3. You should follow a light diet the first few days after arrival home, such as soup and crackers, etc.  Resume your normal diet the day after surgery. °4. Most patients will experience some swelling and bruising on the chest and underarm.  Ice packs will help.  Swelling and bruising can take several days to resolve.  °5. It is common to experience some constipation if taking pain medication after surgery.  Increasing fluid intake and taking a stool softener (such as Colace) will usually help or prevent this problem from occurring.  A mild laxative (Milk of Magnesia or Miralax) should be taken according to package instructions if there are no bowel movements after 48 hours. °6. Unless discharge instructions indicate otherwise, leave your bandage dry and in place until your next appointment in 3-5 days.  You may take a limited sponge bath.  No tube baths or showers until the drains are removed.  You may have steri-strips (small skin tapes) in place directly over the incision.  These strips should be left on the skin for 7-10 days.  If your surgeon used skin glue on the incision, you may  shower in 24 hours.  The glue will flake off over the next 2-3 weeks.  Any sutures or staples will be removed at the office during your follow-up visit. °7. DRAINS:  If you have drains in place, it is important to keep a list of the amount of drainage produced each day in your drains.  Before leaving the hospital, you should be instructed on drain care.  Call our office if you have any questions about your drains. °8. ACTIVITIES:  You may resume regular (light) daily activities beginning the next day--such as daily self-care, walking, climbing stairs--gradually increasing activities as tolerated.  You may have sexual intercourse when it is comfortable.  Refrain from any heavy lifting or straining until approved by your doctor. °a. You may drive when you are no longer taking prescription pain medication, you can comfortably wear a seatbelt, and you can safely maneuver your car and apply brakes. °b. RETURN TO WORK:  __________________________________________________________ °9. You should see your doctor in the office for a follow-up appointment approximately 3-5 days after your surgery.  Your doctor’s nurse will typically make your follow-up appointment when she calls you with your pathology report.  Expect your pathology report 2-3 business days after your surgery.  You may call to check if you do not hear from us after three days.   °10. OTHER INSTRUCTIONS: ______________________________________________________________________________________________ ____________________________________________________________________________________________ °WHEN TO CALL YOUR DOCTOR: °1. Fever over 101.0 °2. Nausea and/or vomiting °3. Extreme swelling or bruising °4. Continued bleeding from incision. °5. Increased pain, redness, or drainage from the incision. °  The clinic staff is available to answer your questions during regular business hours.  Please don’t hesitate to call and ask to speak to one of the nurses for clinical  concerns.  If you have a medical emergency, go to the nearest emergency room or call 911.  A surgeon from [facility] is always on call at the hospital. °[address], [address], [place], [place]  [address] ? [address] [address], [place], [place]   [address] °[phone] ? [phone] ? FAX [phone] °Web site: www.[facility] °[facility] °Surgical drains are used to remove extra fluid that normally builds up in a surgical wound after surgery. A surgical drain helps to heal a surgical wound. Different kinds of surgical drains include: °· Active drains. These drains use suction to pull drainage away from the surgical wound. Drainage flows through a tube to a container outside of the body. It is important to keep the bulb or the drainage container flat (compressed) at all times, except while you empty it. Flattening the bulb or container creates suction. The two most common types of active drains are bulb drains and Hemovac drains. °· Passive drains. These drains allow fluid to drain naturally, by gravity. Drainage flows through a tube to a bandage (dressing) or a container outside of the body. Passive drains do not need to be emptied. The most common type of passive drain is the Penrose drain. ° °A drain is placed during surgery. Immediately after surgery, drainage is usually bright red and a little thicker than water. The drainage may gradually turn yellow or pink and become thinner. It is likely that your health care provider will remove the drain when the drainage stops or when the amount decreases to 1-2 Tbsp (15-30 mL) during a 24-hour period. °How to care for your surgical drain °· Keep the skin around the drain dry and covered with a dressing at all times. °· Check your drain area every day for signs of infection. Check for: °? More redness, swelling, or pain. °? Pus or a bad smell. °? Cloudy drainage. °Follow instructions from your health care provider about how to take care of your  drain and how to change your dressing. Change your dressing at least one time every day. Change it more often if needed to keep the dressing dry. Make sure you: °1. Gather your supplies, including: °? Tape. °? Germ-free cleaning solution (sterile saline). °? Split gauze drain sponge: 4 x 4 inches (10 x 10 cm). °? Gauze square: 4 x 4 inches (10 x 10 cm). °2. Wash your hands with soap and water before you change your dressing. If soap and water are not available, use hand sanitizer. °3. Remove the old dressing. Avoid using scissors to do that. °4. Use sterile saline to clean your skin around the drain. °5. Place the tube through the slit in a drain sponge. Place the drain sponge so that it covers your wound. °6. Place the gauze square or another drain sponge on top of the drain sponge that is on the wound. Make sure the tube is between those layers. °7. Tape the dressing to your skin. °8. If you have an active bulb or Hemovac drain, tape the drainage tube to your skin 1-2 inches (2.5-5 cm) below the place where the tube enters your body. Taping keeps the tube from pulling on any stitches (sutures) that you have. °9. Wash your hands with soap and water. °10. Write down the color of your drainage   and how often you change your dressing. ° °How to empty your active bulb or Hemovac drain °1. Make sure that you have a measuring cup that you can empty your drainage into. °2. Wash your hands with soap and water. If soap and water are not available, use hand sanitizer. °3. Gently move your fingers down the tube while squeezing very lightly. This is called stripping the tube. This clears any drainage, clots, or tissue from the tube. °? Do not pull on the tube. °? You may need to strip the tube several times every day to keep the tube clear. °4. Open the bulb cap or the drain plug. Do not touch the inside of the cap or the bottom of the plug. °5. Empty all of the drainage into the measuring cup. °6. Compress the bulb or the  container and replace the cap or the plug. To compress the bulb or the container, squeeze it firmly in the middle while you close the cap or plug the container. °7. Write down the amount of drainage that you have in each 24-hour period. If you have less than 2 Tbsp (30 mL) of drainage during 24 hours, contact your health care provider. °8. Flush the drainage down the toilet. °9. Wash your hands with soap and water. °Contact a health care provider if: °· You have more redness, swelling, or pain around your drain area. °· The amount of drainage that you have is increasing instead of decreasing. °· You have pus or a bad smell coming from your drain area. °· You have a fever. °· You have drainage that is cloudy. °· There is a sudden stop or a sudden decrease in the amount of drainage that you have. °· Your tube falls out. °· Your active drain does not stay compressed after you empty it. °This information is not intended to replace advice given to you by your health care provider. Make sure you discuss any questions you have with your health care provider. °Document Released: 04/15/2000 Document Revised: 09/24/2015 Document Reviewed: 11/05/2014 °Elsevier Interactive Patient Education © 2018 [employer] ° °

## 2017-12-05 NOTE — Transfer of Care (Signed)
Immediate Anesthesia Transfer of Care Note  Patient: KIMBERLEY SPEECE  Procedure(s) Performed: RIGHT SIMPLE MASTECTOMY WITH RADIOACTIVE SEED TARGETED RIGHT AXIILARY LYMPH NODE EXCISION AND RIGHT SENTINEL LYMPH NODE BIOPSY, LEFT PROPHYLACTIC MASTECTOMY (Bilateral Breast)  Patient Location: PACU  Anesthesia Type:GA combined with regional for post-op pain  Level of Consciousness: drowsy  Airway & Oxygen Therapy: Patient Spontanous Breathing and Patient connected to face mask oxygen  Post-op Assessment: Report given to RN and Post -op Vital signs reviewed and stable  Post vital signs: Reviewed and stable  Last Vitals:  Vitals Value Taken Time  BP 118/64 12/05/2017  1:04 PM  Temp    Pulse 87 12/05/2017  1:07 PM  Resp 16 12/05/2017  1:07 PM  SpO2 100 % 12/05/2017  1:07 PM  Vitals shown include unvalidated device data.  Last Pain:  Vitals:   12/05/17 0925  TempSrc:   PainSc: 0-No pain      Patients Stated Pain Goal: 3 (73/53/29 [id number])  Complications: No apparent anesthesia complications

## 2017-12-05 NOTE — Anesthesia Procedure Notes (Signed)
Procedure Name: Intubation Date/Time: 12/05/2017 10:30 AM Performed by: Jenne Campus, CRNA Pre-anesthesia Checklist: Patient identified, Emergency Drugs available, Suction available, Patient being monitored and Timeout performed Patient Re-evaluated:Patient Re-evaluated prior to induction Oxygen Delivery Method: Circle system utilized Preoxygenation: Pre-oxygenation with 100% oxygen Induction Type: IV induction Ventilation: Oral airway inserted - appropriate to patient size Laryngoscope Size: Mac and 3 Grade View: Grade I Tube type: Oral Tube size: 7.0 mm Number of attempts: 1 Airway Equipment and Method: Stylet Placement Confirmation: ETT inserted through vocal cords under direct vision,  positive ETCO2,  CO2 detector and breath sounds checked- equal and bilateral Secured at: 22 cm Tube secured with: Tape Dental Injury: Teeth and Oropharynx as per pre-operative assessment  Comments: Ett placed by Violet Baldy

## 2017-12-05 NOTE — Interval H&P Note (Signed)
History and Physical Interval Note:  12/05/2017 10:17 AM  Sonia Sandoval  has presented today for surgery, with the diagnosis of RIGHT BREAST CANCER  The various methods of treatment have been discussed with the patient and family. After consideration of risks, benefits and other options for treatment, the patient has consented to  Procedure(s): RIGHT SIMPLE MASTECTOMY WITH RADIOACTIVE SEED TARGETED RIGHT AXIILARY LYMPH NODE EXCISION AND RIGHT SENTINEL LYMPH NODE BIOPSY, LEFT PROPHYLACTIC MASTECTOMY (Bilateral) as a surgical intervention .  The patient's history has been reviewed, patient examined, no change in status, stable for surgery.  I have reviewed the patient's chart and labs.  Questions were answered to the patient's satisfaction.     [place]

## 2017-12-05 NOTE — Op Note (Signed)
Preoperative diagnosis: Stage II right breast cancer  Postoperative diagnosis: Same  Procedure: Right breast simple mastectomy with targeted deep right axillary lymph node biopsy and right sentinel lymph node mapping deep axilla with blue dye and technetium sulfur colloid with left prophylactic mastectomy  Surgeon: [name], MD  Anesthesia: General  EBL: 20 cc  Specimen: Bilateral breasts and targeted right axillary node pathology with right axillary sentinel lymph nodes blue and hot to pathology  Drains: 2 drains in each side which are 21 French  IV fluids: Per anesthesia record    Indications for procedure: The patient presents with stage II right breast cancer.  She is opted for bilateral simple mastectomy with targeted node dissection on the right and sentinel lymph node mapping on the right.  Risk, benefits and other options discussed with the patient.The surgical and non surgical options have been discussed with the patient.  Risks of surgery include bleeding,  Infection,  Flap necrosis,  Tissue loss,  Chronic pain, death, Numbness,  And the need for additional procedures.  Reconstruction options also have been discussed with the patient as well.  The patient agrees to proceed.Sentinel lymph node mapping and dissection has been discussed with the patient.  Risk of bleeding,  Infection,  Seroma formation,  Additional procedures,,  Shoulder weakness ,  Shoulder stiffness,  Nerve and blood vessel injury and reaction to the mapping dyes have been discussed.  Alternatives to surgery have been discussed with the patient.  The patient agrees to proceed.      Description of procedure: The patient was met in the holding area.  She underwent seed localization of the right axillary node by the [place].  She underwent injection of the right breast with technetium sulfur colloid.  She underwent bilateral pectoral blocks.  Questions were answered.  She opted for bilateral  simple mastectomy for prophylaxis in the left.  She was taken back to the operative room.  She was placed supine upon the operating table.  Of note she underwent injection by nuclear medicine prior to arrival to the operating room.  After induction of general anesthesia, both breasts were prepped draped sterile fashion.  Timeout was done.  The left side was done first.  Curvilinear incision was made above below the nipple areolar complex.  The superior and inferior skin flaps were created up to the clavicle and down to the inframammary fold.  The breast was dissected off the chest wall in a medial lateral fashion with pectoralis fashion sent and passed off the field.  Hemostasis achieved.  The right breast was then done.  Neoprobe was used to identify the seed.  A superior skin flap was made.  Dissection was carried into the right axilla and the seed was identified but this was outside the lymph node which was adjacent to it.  I sent the seed separately.  The node was identified and  removed with a hydroclip  in it.  There were 3 sentinel nodes.  Methylene blue was injected of the nipple prior to dissection of the right.  4 cc were used.  There were 2 hot blue node sent as well which included the targeted node and  hemostasis achieved.  Superior skin flap created.  Inferior skin flap created.  The breast was then dissected off the wall in a medial lateral fashion.  There were background counts of 0 after removal of the targeted node and sentinel nodes.  After ensuring hemostasis, 2 drains were placed on each  side and secured with 2-0 nylon.  Wounds closed with 3-0 Vicryl bilaterally.  Wound closed with 4-0 Monocryl bilaterally.  All final counts were found to be correct.  Dermabond applied to both sides.  Breast binder placed.  All final counts were found to be correct.  The patient was awoke extubated taken to recovery in satisfactory condition.

## 2017-12-05 NOTE — Anesthesia Postprocedure Evaluation (Signed)
Anesthesia Post Note  Patient: Sonia Sandoval  Procedure(s) Performed: RIGHT SIMPLE MASTECTOMY WITH RADIOACTIVE SEED TARGETED RIGHT AXIILARY LYMPH NODE EXCISION AND RIGHT SENTINEL LYMPH NODE BIOPSY, LEFT PROPHYLACTIC MASTECTOMY (Bilateral Breast)     Patient location during evaluation: PACU Anesthesia Type: General Level of consciousness: awake and alert Pain management: pain level controlled Vital Signs Assessment: post-procedure vital signs reviewed and stable Respiratory status: spontaneous breathing, nonlabored ventilation and respiratory function stable Cardiovascular status: blood pressure returned to baseline and stable Postop Assessment: no apparent nausea or vomiting Anesthetic complications: no    Last Vitals:  Vitals:   12/05/17 1405 12/05/17 1431  BP: (!) 111/55 (!) 109/57  Pulse: 69 68  Resp: 19 16  Temp:  (!) 36.3 C  SpO2: 100% 97%    Last Pain:  Vitals:   12/05/17 1431  TempSrc: Oral  PainSc:                  [name]

## 2017-12-05 NOTE — Anesthesia Procedure Notes (Addendum)
Anesthesia Regional Block: Pectoralis block   Pre-Anesthetic Checklist: ,, timeout performed, Correct Patient, Correct Site, Correct Laterality, Correct Procedure, Correct Position, risks and benefits discussed, surgical consent, pre-op evaluation,  At surgeon's request and post-op pain management  Laterality: Left  Prep: chloraprep       Needles:  Injection technique: Single-shot  Needle Type: Echogenic Needle     Needle Length: 10cm  Needle Gauge: 21     Additional Needles:   Narrative:  Start time: 12/05/2017 9:17 AM End time: 12/05/2017 9:21 AM Injection made incrementally with aspirations every 5 mL.  Performed by: Personally  Anesthesiologist: [name], MD  Additional Notes: No pain on injection. No increased resistance to injection. Injection made in 5cc increments. Good needle visualization. Patient tolerated the procedure well.

## 2017-12-05 NOTE — Anesthesia Preprocedure Evaluation (Addendum)
Anesthesia Evaluation  Patient identified by MRN, date of birth, ID band Patient awake    Reviewed: Allergy & Precautions, NPO status , Patient's Chart, lab work & pertinent test results  History of Anesthesia Complications Negative for: history of anesthetic complications  Airway Mallampati: II  TM Distance: >3 FB Neck ROM: Full    Dental  (+) Dental Advisory Given, Teeth Intact   Pulmonary neg pulmonary ROS,    breath sounds clear to auscultation       Cardiovascular negative cardio ROS   Rhythm:Regular Rate:Normal     Neuro/Psych negative neurological ROS  negative psych ROS   GI/Hepatic negative GI ROS, Neg liver ROS,   Endo/Other  negative endocrine ROS  Renal/GU negative Renal ROS  negative genitourinary   Musculoskeletal negative musculoskeletal ROS (+)   Abdominal   Peds  Hematology negative hematology ROS (+)   Anesthesia Other Findings   Reproductive/Obstetrics  Breast cancer                             Anesthesia Physical Anesthesia Plan  ASA: II  Anesthesia Plan: General   Post-op Pain Management:  Regional for Post-op pain   Induction: Intravenous  PONV Risk Score and Plan: 3 and Treatment may vary due to age or medical condition, Ondansetron, Dexamethasone and Midazolam  Airway Management Planned: Oral ETT  Additional Equipment: None  Intra-op Plan:   Post-operative Plan: Extubation in OR  Informed Consent: I have reviewed the patients History and Physical, chart, labs and discussed the procedure including the risks, benefits and alternatives for the proposed anesthesia with the patient or authorized representative who has indicated his/her understanding and acceptance.   Dental advisory given  Plan Discussed with: CRNA and Anesthesiologist  Anesthesia Plan Comments:       Anesthesia Quick Evaluation

## 2017-12-06 ENCOUNTER — Encounter (HOSPITAL_COMMUNITY): Payer: Self-pay | Admitting: Surgery

## 2017-12-06 DIAGNOSIS — C50511 Malignant neoplasm of lower-outer quadrant of right female breast: Secondary | ICD-10-CM | POA: Diagnosis not present

## 2017-12-06 LAB — COMPREHENSIVE METABOLIC PANEL
ALT: 21 U/L (ref 0–44)
ANION GAP: 9 (ref 5–15)
AST: 30 U/L (ref 15–41)
Albumin: 3.4 g/dL — ABNORMAL LOW (ref 3.5–5.0)
Alkaline Phosphatase: 44 U/L (ref 38–126)
BILIRUBIN TOTAL: 0.8 mg/dL (ref 0.3–1.2)
BUN: 11 mg/dL (ref 8–23)
CO2: 25 mmol/L (ref 22–32)
Calcium: 8.6 mg/dL — ABNORMAL LOW (ref 8.9–10.3)
Chloride: 105 mmol/L (ref 98–111)
Creatinine, Ser: 0.75 mg/dL (ref 0.44–1.00)
Glucose, Bld: 135 mg/dL — ABNORMAL HIGH (ref 70–99)
POTASSIUM: 4.5 mmol/L (ref 3.5–5.1)
Sodium: 139 mmol/L (ref 135–145)
TOTAL PROTEIN: 5.6 g/dL — AB (ref 6.5–8.1)

## 2017-12-06 LAB — CBC
HEMATOCRIT: 36.6 % (ref 36.0–46.0)
Hemoglobin: 11.7 g/dL — ABNORMAL LOW (ref 12.0–15.0)
MCH: 30.2 pg (ref 26.0–34.0)
MCHC: 32 g/dL (ref 30.0–36.0)
MCV: 94.6 fL (ref 78.0–100.0)
Platelets: 224 10*3/uL (ref 150–400)
RBC: 3.87 MIL/uL (ref 3.87–5.11)
RDW: 12.4 % (ref 11.5–15.5)
WBC: 8.3 10*3/uL (ref 4.0–10.5)

## 2017-12-06 MED ORDER — OXYCODONE HCL 5 MG PO TABS: 5.0000 mg | ORAL_TABLET | Freq: Four times a day (QID) | ORAL | 0 refills | Status: DC | PRN

## 2017-12-06 NOTE — Progress Notes (Signed)
1 Day Post-Op   Subjective/Chief Complaint: Doing well no complaints   Objective: Vital signs in last 24 hours: Temp:  [97.3 F (36.3 C)-97.6 F (36.4 C)] 97.3 F (36.3 C) (08/07 0502) Pulse Rate:  [62-89] 67 (08/07 0502) Resp:  [11-20] 16 (08/07 0502) BP: (87-133)/(50-73) 90/52 (08/07 0502) SpO2:  [94 %-100 %] 99 % (08/07 0502) Last BM Date: 12/03/17  Intake/Output from previous day: 08/06 0701 - 08/07 0700 In: 1598 [P.O.:440; I.V.:1082] Out: 146 [Emesis/NG output:1; Drains:45; Blood:100] Intake/Output this shift: No intake/output data recorded.  Incision/Wound:CDI flaps viable no hematoma  Lab Results:  Recent Labs    12/06/17 0551  WBC 8.3  HGB 11.7*  HCT 36.6  PLT 224   BMET Recent Labs    12/06/17 0551  NA 139  K 4.5  CL 105  CO2 25  GLUCOSE 135*  BUN 11  CREATININE 0.75  CALCIUM 8.6*   PT/INR No results for input(s): LABPROT, INR in the last 72 hours. ABG No results for input(s): PHART, HCO3 in the last 72 hours.  Invalid input(s): PCO2, PO2  Studies/Results: Nm Sentinel Node Inj-no Rpt (breast)  Result Date: 12/05/2017 Sulfur colloid was injected by the nuclear medicine technologist for melanoma sentinel node.   Mm Breast Surgical Specimen  Result Date: 12/05/2017 CLINICAL DATA:  Evaluate for radioactive seed EXAM: SPECIMEN RADIOGRAPH OF THE RIGHT BREAST COMPARISON:  Previous exam(s). FINDINGS: Status post excision of the right axilla. The radioactive seed is present, completely intact, and were marked for pathology. IMPRESSION: The radioactive seed is present in the presented container. 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 The patient is having a RIGHT mastectomy tomorrow  and radioactive seed localization of the lymph node is requested for targeted node dissection. EXAM: ULTRASOUND GUIDED RADIOACTIVE SEED LOCALIZATION OF A RIGHT AXILLARY LYMPH NODE COMPARISON:  Previous exam(s). FINDINGS: Patient presents for radioactive seed localization prior to RIGHT mastectomy and targeted RIGHT axillary lymph node dissection. I met with the patient and we discussed the procedure of seed localization including benefits and alternatives. We discussed the high likelihood of a successful procedure. We discussed the risks of the procedure including infection, bleeding, tissue injury and further surgery. We discussed the low dose of radioactivity involved in the procedure. Informed, written consent was given. The usual time-out protocol was performed immediately prior to the procedure. As the lymph node was difficult to identify due to the post biopsy changes, I had the ultrasound technologist assist me in localizing the lymph node. Once identified, using ultrasound guidance, sterile technique with chlorhexidine as skin antisepsis, 1% lidocaine as local anesthesia, an I-125 radioactive seed was used to localize the biopsy-proven low RIGHT axillary metastatic lymph node using an inferolateral approach. Follow-up mammography was not performed as the node was not visible on the post clip mammogram at the time of biopsy. Follow-up survey of the patient confirms the presence of the radioactive seed. Order number of I-125 seed: [id number] Total activity: 0.248 mCi Reference Date: 11/28/2017 The patient tolerated the procedure well and was released from the [facility]. She was given instructions regarding seed removal. IMPRESSION: Radioactive seed localization of a metastatic RIGHT axillary lymph node. No apparent complications. Electronically Signed   By: Evangeline Dakin  M.D.   On: 12/04/2017 16:14    Anti-infectives: Anti-infectives (From admission, onward)   Start     Dose/Rate Route Frequency  Ordered Stop   12/05/17 1800  ceFAZolin (ANCEF) IVPB 2g/100 mL premix     2 g 200 mL/hr over 30 Minutes Intravenous Every 8 hours 12/05/17 1519 12/05/17 1812   12/05/17 0930  ceFAZolin (ANCEF) IVPB 2g/100 mL premix     2 g 200 mL/hr over 30 Minutes Intravenous To [facility] 12/04/17 0726 12/05/17 1043      Assessment/Plan: s/p Procedure(s): RIGHT SIMPLE MASTECTOMY WITH RADIOACTIVE SEED TARGETED RIGHT AXIILARY LYMPH NODE EXCISION AND RIGHT SENTINEL LYMPH NODE BIOPSY, LEFT PROPHYLACTIC MASTECTOMY (Bilateral) Discharge  LOS: 0 days    Sonia Sandoval 12/06/2017

## 2017-12-06 NOTE — Progress Notes (Signed)
Patient given discharge instructions and verbalized understanding. Reiterated education for JP drain with teach back. Patient left unit in stable condition.

## 2017-12-06 NOTE — Discharge Summary (Signed)
Physician Discharge Summary  Patient ID: Sonia Sandoval MRN: [id number] DOB/AGE: 66-Oct-1952 66 y.o.  Admit date: 12/05/2017 Discharge date: 12/06/2017  Admission Diagnoses: Stage II right breast cancer  Discharge Diagnoses:  Active Problems:   Breast cancer, stage 2, right [facility])   Discharged Condition: good  Hospital Course: Patient admitted for bilateral simple mastectomy with targeted right axillary lymph node mapping and seed localization lymph node biopsy for stage II right breast cancer.  She opted for prophylactic left mastectomy due to her concern for a new breast cancer of her lifetime.  The patient did well postoperatively.  Postop day 1 she was ablating.  Her pain control was adequate.  She had no evidence of hematoma or flap necrosis and or drainage from her drains was bloody but appropriate.  Her labs are reviewed and were felt to be acceptable postoperatively.  She was discharged home postoperative day 1 in good condition    Significant Diagnostic Studies: labs:  CBC    Component Value Date/Time   WBC 8.3 12/06/2017 0551   RBC 3.87 12/06/2017 0551   HGB 11.7 (L) 12/06/2017 0551   HGB 13.8 11/01/2017 0850   HCT 36.6 12/06/2017 0551   PLT 224 12/06/2017 0551   PLT 246 11/01/2017 0850   MCV 94.6 12/06/2017 0551   MCH 30.2 12/06/2017 0551   MCHC 32.0 12/06/2017 0551   RDW 12.4 12/06/2017 0551   LYMPHSABS 1.4 11/28/2017 1130   MONOABS 0.3 11/28/2017 1130   EOSABS 0.1 11/28/2017 1130   BASOSABS 0.0 11/28/2017 1130    Treatments: surgery: Bilateral mastectomy with right axillary sentinel lymph node mapping and targeted node  Discharge Exam: Blood pressure (!) 90/52, pulse 67, temperature (!) 97.3 F (36.3 C), temperature source Oral, resp. rate 16, height 5' 2.5" (1.588 m), SpO2 99 %. General appearance: alert and cooperative Resp: clear to auscultation bilaterally Cardio: regular rate and rhythm, S1, S2 normal, no murmur, click, rub or gallop Incision/Wound: Flaps  viable.  Incisions clean dry and intact without hematoma.  Appropriate bloody drainage noted in JPs.  Disposition: Discharge disposition: 01-Home or Self Care       Discharge Instructions    Diet - low sodium heart healthy   Complete by:  As directed    Increase activity slowly   Complete by:  As directed       Follow-up Information    Reah Justo, MD. Schedule an appointment as soon as possible for a visit in 9 day(s).   Specialty:  General Surgery Contact information: [address] [place] [address] [phone]           Signed: Turner Daniels 12/06/2017, 7:28 AM

## 2017-12-07 ENCOUNTER — Other Ambulatory Visit: Payer: Self-pay | Admitting: Oncology

## 2017-12-12 ENCOUNTER — Telehealth (HOSPITAL_COMMUNITY): Payer: Self-pay | Admitting: Vascular Surgery

## 2017-12-12 NOTE — Telephone Encounter (Signed)
Left pt message to make new brst/echo appt w/ db or dm in Oct

## 2017-12-19 ENCOUNTER — Encounter: Payer: Self-pay | Admitting: Radiation Oncology

## 2017-12-19 ENCOUNTER — Other Ambulatory Visit: Payer: Self-pay

## 2017-12-19 ENCOUNTER — Ambulatory Visit
Admission: RE | Admit: 2017-12-19 | Discharge: 2017-12-19 | Disposition: A | Discharge status: Home / Self Care | Payer: BLUE CROSS/BLUE SHIELD | Source: Ambulatory Visit | Attending: Radiation Oncology | Admitting: Radiation Oncology

## 2017-12-19 ENCOUNTER — Ambulatory Visit: Payer: BLUE CROSS/BLUE SHIELD

## 2017-12-19 VITALS — BP 112/67 | HR 67 | Temp 99.1°F | Resp 20 | Ht 62.5 in | Wt 142.8 lb

## 2017-12-19 DIAGNOSIS — Z170 Estrogen receptor positive status [ER+]: Principal | ICD-10-CM

## 2017-12-19 DIAGNOSIS — Z9013 Acquired absence of bilateral breasts and nipples: Secondary | ICD-10-CM | POA: Diagnosis not present

## 2017-12-19 DIAGNOSIS — Z885 Allergy status to narcotic agent status: Secondary | ICD-10-CM | POA: Diagnosis not present

## 2017-12-19 DIAGNOSIS — C50511 Malignant neoplasm of lower-outer quadrant of right female breast: Secondary | ICD-10-CM | POA: Diagnosis not present

## 2017-12-19 DIAGNOSIS — Z853 Personal history of malignant neoplasm of breast: Secondary | ICD-10-CM | POA: Diagnosis not present

## 2017-12-19 NOTE — Addendum Note (Signed)
Encounter addended by: Malena Edman, RN on: 12/19/2017 3:36 PM  Actions taken: Visit Navigator Flowsheet section accepted

## 2017-12-19 NOTE — Progress Notes (Signed)
Location of Breast Cancer:Lower-outer quadrant of right breast of female  Histology per Pathology Report:   FINAL DIAGNOSIS Diagnosis 10-23-17 1. Breast, right, needle core biopsy, axilla, 6:30 o'clock - INVASIVE DUCTAL CARCINOMA, GRADE III. - SEE NOTE. 2. Lymph node, needle/core biopsy, right axillary - INVASIVE DUCTAL CARCINOMA, GRADE III. - LYMPHOID TISSUE IS NOT IDENTIFIED. - SEE NOTE. Diagnosis Note 1. -2. Invasive carcinoma in part 1 measures 1.4 cm in greatest dimension. The carcinoma in part 2 is histomorphologically very similar to carcinoma in part 1 and may represent an entirely replaced lymph node. Dr. Lyndon Code has reviewed this case and concurs with the above interpretation. [facility] was notified on 10/24/17. A breast prognostic profile is pending and will be reported as an  Receptor Status: ER(95 % +), PR (45 % +), Her2-neu (-), Ki-(20%)  Did patient present with symptoms (if so, please note symptoms) or was this found on screening mammography?: "[name]" felt that her right breast was noticeably smaller than the left. She brought this to her nurse practitioner's attention.  She had not had mammography in several years.  [name] was set up for bilateral diagnostic mammography with tomography and right breast ultrasonography at [facility] on 10/19/2017  Past/Anticipated interventions by surgeon, if any: INAL DIAGNOSIS Diagnosis 12-05-17 Dr. Marcello Moores [place] 1. Medical device, removal, Radioactive seed - GROSSLY CONSISTENT WITH RADIOACTIVE SEED 2. Breast, simple mastectomy, Left - FIBROCYSTIC CHANGES - NO CARCINOMA IDENTIFIED 3. Lymph nodes, regional resection, Right axillary - METASTATIC CARCINOMA INVOLVING ONE OF THREE LYMPH NODES, 0.5 CM (1/3) - EXTRACAPSULAR EXTENSION PRESENT 4. Breast, simple mastectomy, Right - INVASIVE DUCTAL CARCINOMA, [place] GRADE 3 OF 3, 4.0 CM - MARGINS UNINVOLVED BY CARCINOMA (0.2 CM; POSTERIOR MARGIN) - LYMPHOVASCULAR  SPACE INVASION PRESENT - PREVIOUS BIOPSY SITE CHANGES PRESENT - FIBROADENOMA AND FIBROCYSTIC CHANGES - SEE ONCOLOGY TABLE BELOW Microscopic Comment 4. INVASIVE CARCINOMA OF THE BREAST: Resection Procedure: Mastectomy Receptor Status: ER(95 % +), PR (45 % +), Her2-neu (-), Ki-(20%)  Past/Anticipated interventions by medical oncology, if any: Dr. Jana Hakim    Chemotherapy Neoadjuvant treatment most likely with chemotherapy  10-23-17 Mammaprint  Result Low Risk  Adjuvant radiation Antiestrogens at the completion of local treatment  Lymphedema issues, if any: No    ROM to right and left arm altered to both arms can raise both slowly.  Will have a PT evaluation on 12-28-17. Skin to right and left breast has bruising along the incision line and the surgical  glue is intact.  Skin is sensitive to touch.  Had JP drains taken out Friday, 12-14-17 and Monday, 12-18-17 noticed clear drainage scant amount. Follow up appointment with Dr. [name] Monday, 12-18-17.  Pain issues, if any: No reports she has pain in the evenings at the drain sites.  SAFETY ISSUES:  Prior radiation? :No  Pacemaker/ICD? : No  Possible current pregnancy? No  Is the patient on methotrexate? :No   No LMP recorded. Menarche: 67 years old Age at first live birth: 67 years old She is GXP4.  LMP: about 25 years ago ( early 62's). She used hormone replacement for about 3-5 years. She also used oral contraception for 1 year with no complications.   Current Complaints / other details:    Wt Readings from Last 3 Encounters:  12/19/17 142 lb 12.8 oz (64.8 kg)  11/28/17 145 lb 3.2 oz (65.9 kg)  11/01/17 146 lb (66.2 kg)     BP 112/67 (BP Location: Left Arm, Patient Position: Sitting)  Pulse 67   Temp 99.1 F (37.3 C) (Oral)   Resp 20   Ht 5' 2.5" (1.588 m)   Wt (!) 1428 lb 12.8 oz (648.1 kg)   SpO2 99%   BMI 257.17 kg/m   Georgena Spurling, RN 12/19/2017,10:04 AM

## 2017-12-19 NOTE — Progress Notes (Signed)
Radiation Oncology         [phone] ________________________________  Name: Sonia Sandoval        MRN: [id number]  Date of Service: 12/19/2017 DOB: [birth year]  CC:[name], FNP  Magrinat, [name], MD     REFERRING PHYSICIAN: Magrinat, [name], MD   DIAGNOSIS: The encounter diagnosis was Malignant neoplasm of lower-outer quadrant of right breast of female, estrogen receptor positive ([place]).   HISTORY OF PRESENT ILLNESS: Sonia Sandoval is a 67 y.o. female seen in the multidisciplinary breast clinic for a new diagnosis of right breast cancer. The patient was noted to have a palpable mass in the right breast. Diagnostic imaging revealed a 3.3 x 3.2 x 2.8 cm in the right breast at 6:30. She had a 9 mm axillary node by ultrasound as well. She underwent a biopsy on 10/23/17 that revealed a grade 3, invasive ductal carcinoma. Her tumor was ER/PR positive, HER2 negative, and Ki 67 20%. She underwent MammaPrint testing which was low risk.  She also underwent bilateral mastectomy on 12/05/2017.  Final pathology of the left breast was negative for any malignancy.  Her right breast revealed a grade 3 invasive ductal carcinoma measuring 4 cm.  Her margins were negative though there was lymphovascular space involvement.  Three axillary lymph nodes were sampled, one contained disease with extracapsular extension.  She comes today to discuss the role of adjuvant radiotherapy, and plans to begin antiestrogen therapy at the conclusion of radiation.  PREVIOUS RADIATION THERAPY: No   PAST MEDICAL HISTORY:  Past Medical History:  Diagnosis Date  . Breast cancer, right breast ([place]) 09/2017   S/P mastectomy 12/05/2017  . PONV (postoperative nausea and vomiting)        PAST SURGICAL HISTORY: Past Surgical History:  Procedure Laterality Date  . BACK SURGERY    . BREAST BIOPSY Right 09/2017  . [place]; 1992  . DILATION AND CURETTAGE OF UTERUS    . [place] SURGERY  2003  .  MASTECTOMY Left 12/05/2017   PROPHYLACTIC MASTECTOMY  . MASTECTOMY COMPLETE / SIMPLE W/ SENTINEL NODE BIOPSY Right 12/05/2017   WITH RADIOACTIVE SEED TARGETED RIGHT AXIILARY LYMPH NODE EXCISION AND RIGHT SENTINEL LYMPH NODE BIOPSY,   . MASTECTOMY WITH RADIOACTIVE SEED GUIDED EXCISION AND AXILLARY SENTINEL LYMPH NODE BIOPSY Bilateral 12/05/2017   Procedure: RIGHT SIMPLE MASTECTOMY WITH RADIOACTIVE SEED TARGETED RIGHT AXIILARY LYMPH NODE EXCISION AND RIGHT SENTINEL LYMPH NODE BIOPSY, LEFT PROPHYLACTIC MASTECTOMY;  Surgeon: [name], MD;  Location: [facility];  Service: General;  Laterality: Bilateral;     FAMILY HISTORY:  Family History  Problem Relation Age of Onset  . Lung cancer Father      SOCIAL HISTORY:  reports that she has never smoked. She has never used smokeless tobacco. She reports that she drinks about 14.0 standard drinks of alcohol per week. She reports that she does not use drugs. The patient is married nad lives in [place]. She is retired and enjoys yard work. She enjoys an occasional beer. She has four adult children.   ALLERGIES: Codeine   MEDICATIONS:  Current Outpatient Medications  Medication Sig Dispense Refill  . calcium carbonate (TUMS - DOSED IN MG ELEMENTAL CALCIUM) 500 MG chewable tablet Chew 1 tablet by mouth daily.    Marland Kitchen ibuprofen (ADVIL,MOTRIN) 200 MG tablet Take 400 mg by mouth daily as needed for headache.    . naproxen sodium (ALEVE) 220 MG tablet Take 220 mg by mouth daily as needed (back  pain).    . Nutritional Supplements (JUICE PLUS FIBRE PO) Juice plus fruits x 3 chews Juice plus veggie x 3 chews Juice plus Vine x 3 chews Juice plus Omega x 2 capsule    . oxyCODONE (OXY IR/ROXICODONE) 5 MG immediate release tablet Take 1 tablet (5 mg total) by mouth every 6 (six) hours as needed for severe pain. 10 tablet 0  . Prenatal Vit-Fe Fumarate-FA (PRENATAL PO) Take 1 tablet by mouth daily.    . Pseudoephedrine-Naproxen Na (ALEVE-D SINUS & HEADACHE PO)  Take 1 tablet by mouth daily as needed (sinus headaches).     No current facility-administered medications for this encounter.      REVIEW OF SYSTEMS: On review of systems, the patient reports that she is doing well overall.  She had her mastectomy drain tubes pulled yesterday, and reports that she has been feeling quite well overall.  Her husband has an upcoming surgery for his back with Dr. Ellene Route on 01/09/2018.  She is hoping to start treatment the week after this.  She denies any chest pain, shortness of breath, cough, fevers, chills, night sweats, unintended weight changes. She denies any bowel or bladder disturbances, and denies abdominal pain, nausea or vomiting. She denies any new musculoskeletal or joint aches or pains. A complete review of systems is obtained and is otherwise negative.     PHYSICAL EXAM:  Wt Readings from Last 3 Encounters:  12/19/17 142 lb 12.8 oz (64.8 kg)  11/28/17 145 lb 3.2 oz (65.9 kg)  11/01/17 146 lb (66.2 kg)   Temp Readings from Last 3 Encounters:  12/19/17 99.1 F (37.3 C) (Oral)  12/06/17 98.7 F (37.1 C) (Oral)  11/28/17 98.3 F (36.8 C)   BP Readings from Last 3 Encounters:  12/19/17 112/67  12/06/17 102/61  11/28/17 132/75   Pulse Readings from Last 3 Encounters:  12/19/17 67  12/06/17 72  11/28/17 78     In general this is a well appearing caucasian female in no acute distress. She is alert and oriented x4 and appropriate throughout the examination. HEENT reveals that the patient is normocephalic, atraumatic. EOMs are intact.  Cardiopulmonary assessment is negative for acute distress and she exhibits normal effort.  Her bilateral mastectomy scars are healing well.  She has a dressing over her inferior drain tube site from yesterday's removal along the right chest wall.  No evidence of erythematous streaking, fullness, or edema of the chest wall or upper extremities are noted.   ECOG = 1  0 - Asymptomatic (Fully active, able to carry  on all predisease activities without restriction)  1 - Symptomatic but completely ambulatory (Restricted in physically strenuous activity but ambulatory and able to carry out work of a light or sedentary nature. For example, light housework, office work)  2 - Symptomatic, <50% in bed during the day (Ambulatory and capable of all self care but unable to carry out any work activities. Up and about more than 50% of waking hours)  3 - Symptomatic, >50% in bed, but not bedbound (Capable of only limited self-care, confined to bed or chair 50% or more of waking hours)  4 - Bedbound (Completely disabled. Cannot carry on any self-care. Totally confined to bed or chair)  5 - Death   Eustace Pen MM, Creech RH, Tormey DC, et al. [phone]). "Toxicity and response criteria of the [facility] Group". [place] Oncol. 5 (6): 649-55    LABORATORY DATA:  Lab Results  Component Value Date  WBC 8.3 12/06/2017   HGB 11.7 (L) 12/06/2017   HCT 36.6 12/06/2017   MCV 94.6 12/06/2017   PLT 224 12/06/2017   Lab Results  Component Value Date   NA 139 12/06/2017   K 4.5 12/06/2017   CL 105 12/06/2017   CO2 25 12/06/2017   Lab Results  Component Value Date   ALT 21 12/06/2017   AST 30 12/06/2017   ALKPHOS 44 12/06/2017   BILITOT 0.8 12/06/2017      RADIOGRAPHY: Nm Sentinel Node Inj-no Rpt (breast)  Result Date: 12/05/2017 Sulfur colloid was injected by the nuclear medicine technologist for melanoma sentinel node.   Mm Breast Surgical Specimen  Result Date: 12/05/2017 CLINICAL DATA:  Evaluate for radioactive seed EXAM: SPECIMEN RADIOGRAPH OF THE RIGHT BREAST COMPARISON:  Previous exam(s). FINDINGS: Status post excision of the right axilla. The radioactive seed is present, completely intact, and were marked for pathology. IMPRESSION: The radioactive seed is present in the presented container. Electronically Signed   By: Dorise Bullion III M.D   On: 12/05/2017 11:47   [place] Rt Radioactive Seed  Loc  Result Date: 12/04/2017 CLINICAL DATA:  Biopsy-proven grade 3 invasive ductal carcinoma involving the LOWER OUTER QUADRANT of the RIGHT breast and metastatic disease involving a low RIGHT axillary lymph node. The patient is having a RIGHT mastectomy tomorrow and radioactive seed localization of the lymph node is requested for targeted node dissection. EXAM: ULTRASOUND GUIDED RADIOACTIVE SEED LOCALIZATION OF A RIGHT AXILLARY LYMPH NODE COMPARISON:  Previous exam(s). FINDINGS: Patient presents for radioactive seed localization prior to RIGHT mastectomy and targeted RIGHT axillary lymph node dissection. I met with the patient and we discussed the procedure of seed localization including benefits and alternatives. We discussed the high likelihood of a successful procedure. We discussed the risks of the procedure including infection, bleeding, tissue injury and further surgery. We discussed the low dose of radioactivity involved in the procedure. Informed, written consent was given. The usual time-out protocol was performed immediately prior to the procedure. As the lymph node was difficult to identify due to the post biopsy changes, I had the ultrasound technologist assist me in localizing the lymph node. Once identified, using ultrasound guidance, sterile technique with chlorhexidine as skin antisepsis, 1% lidocaine as local anesthesia, an I-125 radioactive seed was used to localize the biopsy-proven low RIGHT axillary metastatic lymph node using an inferolateral approach. Follow-up mammography was not performed as the node was not visible on the post clip mammogram at the time of biopsy. Follow-up survey of the patient confirms the presence of the radioactive seed. Order number of I-125 seed: [id number] Total activity: 0.248 mCi Reference Date: 11/28/2017 The patient tolerated the procedure well and was released from the [facility]. She was given instructions regarding seed removal. IMPRESSION: Radioactive  seed localization of a metastatic RIGHT axillary lymph node. No apparent complications. Electronically Signed   By: Evangeline Dakin M.D.   On: 12/04/2017 16:14       IMPRESSION/PLAN: 1. Stage IIa, PT2N1M0, grade 3 ER/PR positive, invasive ductal carcinoma of the right breast. Dr. Lisbeth Renshaw discusses the final pathology findings and reviews the nature of invasive breast disease.  The patient does not need any additional systemic therapy given her low risk MammaPrint score.  She is ready now to move forward with the planning and delivery of radiation.  She is still only 2 weeks out from her surgery, and would benefit from additional healing.  We discussed the risks, benefits, short, and long term effects  of radiotherapy, and the patient is interested in proceeding. Dr. Lisbeth Renshaw discusses the delivery and logistics of radiotherapy and anticipates a course of 6 1/2 weeks of radiotherapy to the chest wall and regional lymph nodes.  She will be contacted by our staff to coordinate simulation the second week of September, and we anticipate that she would start radiation the week of January 15, 2018.  In a visit lasting 25 minutes, greater than 50% of the time was spent face to face discussing her case, and coordinating the patient's care.  The above documentation reflects my direct findings during this shared patient visit. Please see the separate note by Dr. Lisbeth Renshaw on this date for the remainder of the patient's plan of care.    Carola Rhine, PAC

## 2017-12-28 ENCOUNTER — Ambulatory Visit: Payer: BLUE CROSS/BLUE SHIELD | Attending: Surgery | Admitting: Physical Therapy

## 2017-12-28 ENCOUNTER — Other Ambulatory Visit: Payer: Self-pay

## 2017-12-28 ENCOUNTER — Encounter: Payer: Self-pay | Admitting: Physical Therapy

## 2017-12-28 DIAGNOSIS — M25611 Stiffness of right shoulder, not elsewhere classified: Secondary | ICD-10-CM | POA: Diagnosis present

## 2017-12-28 DIAGNOSIS — C50511 Malignant neoplasm of lower-outer quadrant of right female breast: Secondary | ICD-10-CM | POA: Insufficient documentation

## 2017-12-28 DIAGNOSIS — M25612 Stiffness of left shoulder, not elsewhere classified: Secondary | ICD-10-CM | POA: Diagnosis present

## 2017-12-28 DIAGNOSIS — R293 Abnormal posture: Secondary | ICD-10-CM | POA: Diagnosis present

## 2017-12-28 DIAGNOSIS — Z483 Aftercare following surgery for neoplasm: Secondary | ICD-10-CM | POA: Insufficient documentation

## 2017-12-28 DIAGNOSIS — Z170 Estrogen receptor positive status [ER+]: Secondary | ICD-10-CM | POA: Insufficient documentation

## 2017-12-28 NOTE — Therapy (Signed)
[place], [place], [address] Phone: [phone]   Fax:  [phone]  Physical Therapy Treatment  Patient Details  Name: Sonia Sandoval MRN: [id number] Date of Birth: [birth year] Referring Provider: Dr. [name]   Encounter Date: 12/28/2017  PT End of Session - 12/28/17 1049    Visit Number  2    Number of Visits  8    Date for PT Re-Evaluation  01/25/18    PT Start Time  1010    PT Stop Time  1052    PT Time Calculation (min)  42 min    Activity Tolerance  Patient tolerated treatment well    Behavior During Therapy  [facility] for tasks assessed/performed       Past Medical History:  Diagnosis Date  . Breast cancer, right breast ([facility]) 09/2017   S/P mastectomy 12/05/2017  . PONV (postoperative nausea and vomiting)     Past Surgical History:  Procedure Laterality Date  . BACK SURGERY    . BREAST BIOPSY Right 09/2017  . [place]; 1992  . DILATION AND CURETTAGE OF UTERUS    . [place] SURGERY  2003  . MASTECTOMY Left 12/05/2017   PROPHYLACTIC MASTECTOMY  . MASTECTOMY COMPLETE / SIMPLE W/ SENTINEL NODE BIOPSY Right 12/05/2017   WITH RADIOACTIVE SEED TARGETED RIGHT AXIILARY LYMPH NODE EXCISION AND RIGHT SENTINEL LYMPH NODE BIOPSY,   . MASTECTOMY WITH RADIOACTIVE SEED GUIDED EXCISION AND AXILLARY SENTINEL LYMPH NODE BIOPSY Bilateral 12/05/2017   Procedure: RIGHT SIMPLE MASTECTOMY WITH RADIOACTIVE SEED TARGETED RIGHT AXIILARY LYMPH NODE EXCISION AND RIGHT SENTINEL LYMPH NODE BIOPSY, LEFT PROPHYLACTIC MASTECTOMY;  Surgeon: [name], MD;  Location: [facility];  Service: General;  Laterality: Bilateral;    There were no vitals filed for this visit.  Subjective Assessment - 12/28/17 1021    Subjective  Patient underwent a bilateral mastectomy and right sentinel node biopsy on 12/05/17. She had 1/3 axillary nodes positive. Mammaprint was low so she begins radiation 01/15/18 for 33 treatments.     Pertinent History  Patient was diagnosed on 10/19/17 with right invasive ductal carcinoma breast cancer. It is ER/PR positive and HER2 negative with a Ki67 of 10%. Patient underwent a bilateral mastectomy and right sentinel node biopsy on 12/05/17. She had 1/3 axillary nodes positive.     Patient Stated Goals  Get arm normal    Currently in Pain?  Yes    Pain Score  8     Pain Location  Axilla    Pain Orientation  Right;Left    Pain Descriptors / Indicators  Numbness    Pain Type  Surgical pain    Pain Onset  1 to 4 weeks ago    Pain Frequency  Constant    Aggravating Factors   Nothing    Pain Relieving Factors  Nothing         [facility] PT Assessment - 12/28/17 0001      Assessment   Medical Diagnosis  s/p bil mastectomy with right SLNB    Referring Provider  Dr. Marcello Moores Cornett    Onset Date/Surgical Date  12/05/17    Hand Dominance  Right    Prior Therapy  Baselines      Precautions   Precautions  Other (comment)    Precaution Comments  right arm lymphedema risk      Restrictions   Weight Bearing Restrictions  No      Balance Screen   Has the patient  fallen in the past 6 months  No    Has the patient had a decrease in activity level because of a fear of falling?   No    Is the patient reluctant to leave their home because of a fear of falling?   No      Home Film/video editor residence    Living Arrangements  Spouse/significant other;Other relatives   Husband and mother-in-law   Available Help at Discharge  Family      Prior Function   Level of Independence  Independent    Vocation  Retired    Leisure  Has not returned to [employer]   Overall Cognitive Status  Within Functional Limits for tasks assessed      Observation/Other Assessments   Observations  Redness present around incisions, mostly on left chest. Scab/glue present (about 1 cm wide) on left lateral incision. It does not appear infected but is red and irritated. Mild  cording present left axilla.      Posture/Postural Control   Posture/Postural Control  Postural limitations    Postural Limitations  Rounded Shoulders;Forward head      ROM / Strength   AROM / PROM / Strength  AROM      AROM   AROM Assessment Site  Shoulder    Right/Left Shoulder  Right;Left    Right Shoulder Extension  57 Degrees    Right Shoulder Flexion  149 Degrees    Right Shoulder ABduction  166 Degrees    Right Shoulder Internal Rotation  71 Degrees    Right Shoulder External Rotation  86 Degrees    Left Shoulder Extension  67 Degrees    Left Shoulder Flexion  134 Degrees    Left Shoulder ABduction  140 Degrees    Left Shoulder Internal Rotation  73 Degrees    Left Shoulder External Rotation  71 Degrees        LYMPHEDEMA/ONCOLOGY QUESTIONNAIRE - 12/28/17 1027      Type   Cancer Type  Right breast      Surgeries   Mastectomy Date  12/05/17    Sentinel Lymph Node Biopsy Date  12/05/17    Number Lymph Nodes Removed  3      Treatment   Active Chemotherapy Treatment  No    Past Chemotherapy Treatment  No    Active Radiation Treatment  No    Past Radiation Treatment  No    Current Hormone Treatment  No    Past Hormone Therapy  No      What other symptoms do you have   Are you Having Heaviness or Tightness  Yes    Are you having Pain  Yes    Are you having pitting edema  No    Is it Hard or Difficult finding clothes that fit  No    Do you have infections  No    Is there Decreased scar mobility  Yes    Stemmer Sign  No      Lymphedema Assessments   Lymphedema Assessments  Upper extremities      Right Upper Extremity Lymphedema   10 cm Proximal to Olecranon Process  28.3 cm    Olecranon Process  25.8 cm    10 cm Proximal to Ulnar Styloid Process  22.4 cm    Just Proximal to Ulnar Styloid Process  15.2 cm    Across Hand at [employer]  20 cm  At Base of 2nd Digit  6.6 cm      Left Upper Extremity Lymphedema   10 cm Proximal to Olecranon Process   27.1 cm    Olecranon Process  24.2 cm    10 cm Proximal to Ulnar Styloid Process  21.8 cm    Just Proximal to Ulnar Styloid Process  15.6 cm    Across Hand at Thumb Web Space  19.4 cm    At Base of 2nd Digit  6.3 cm        Quick Dash - 12/28/17 0001    Open a tight or new jar  Mild difficulty    Do heavy household chores (wash walls, wash floors)  No difficulty    Carry a shopping bag or briefcase  No difficulty    Wash your back  Mild difficulty    Use a knife to cut food  No difficulty    During the past week, to what extent has your arm, shoulder or hand problem interfered with your normal social activities with family, friends, neighbors, or groups?  Modererately    During the past week, to what extent has your arm, shoulder or hand problem limited your work or other regular daily activities  Slightly    Arm, shoulder, or hand pain.  Mild    Tingling (pins and needles) in your arm, shoulder, or hand  Moderate    Difficulty Sleeping  Moderate difficulty    DASH Score  20.45 %                     PT Education - 12/28/17 1049    Education Details  Reviewed post op HEP    Person(s) Educated  Patient    Methods  Explanation;Demonstration;Handout    Comprehension  Verbalized understanding;Returned demonstration          PT Long Term Goals - 12/28/17 1226      PT LONG TERM GOAL #1   Title  Patient will demonstrate she has returned ot baseline related to shoulder ROM and function post operatively.    Time  4    Period  Weeks    Status  On-going      PT LONG TERM GOAL #2   Title  Increase left shoulder flexion to >/= 155 degrees for increased ease reaching overhead.    Time  4    Period  Weeks    Status  New      PT LONG TERM GOAL #3   Title  Increase left shoulder abduction to >/= 165 degrees for increased ease reaching overhead.    Time  4    Period  Weeks    Status  New      PT LONG TERM GOAL #4   Title  Patient will demonstrate she can obtain  radiation positioning without increased axillary pain.    Time  4    Period  Weeks    Status  New      PT LONG TERM GOAL #5   Title  Patient will verbalize good understanding of lymphedema risk reduction practices.    Time  4    Period  Weeks    Status  New            Plan - 12/28/17 1051    Clinical Impression Statement  Patient is doing very well s/p bilateral mastectomy with right sentinel node biopsy. She is actually more limited with shoulder ROM on her left side and   that incision has more irritation and scabbing present. Mild cording present on the left as well. Mild edema present left lateral trunk at lateral aspect of incision so compression foam was issued for her to try between her skin and camisol. She will benefit from PT to regain shoulder ROM and function and address scar tissue when it is healed.    Rehab Potential  Excellent    PT Frequency  2x / week    PT Duration  4 weeks    PT Treatment/Interventions  ADLs/Self Care Home Management;Therapeutic exercise;Patient/family education;Scar mobilization;Passive range of motion;Manual lymph drainage;Manual techniques;Therapeutic activities;DME Instruction    PT Next Visit Plan  PROM bil shoulders with left more than right; ROM exercises; scar massage when healed    PT Home Exercise Plan  Post op shoulder ROM HEP    Consulted and Agree with Plan of Care  Patient       Patient will benefit from skilled therapeutic intervention in order to improve the following deficits and impairments:  Decreased knowledge of precautions, Impaired UE functional use, Postural dysfunction, Pain, Decreased scar mobility, Increased edema, Impaired sensation, Decreased skin integrity, Decreased knowledge of use of DME, Decreased range of motion  Visit Diagnosis: Malignant neoplasm of lower-outer quadrant of right breast of female, estrogen receptor positive ([facility])  Abnormal posture  Aftercare following surgery for neoplasm  Stiffness of left  shoulder, not elsewhere classified  Stiffness of right shoulder, not elsewhere classified     Problem List Patient Active Problem List   Diagnosis Date Noted  . Breast cancer, stage 2, right ([facility]) 12/05/2017  . Malignant neoplasm of lower-outer quadrant of right breast of female, estrogen receptor positive ([facility]) 10/27/2017    Marti Cooper Smith, PT 12/28/17 12:37 PM   [facility] [address] [place], [place], [address] Phone: [phone]   Fax:  [phone]  Name: Sonia Sandoval MRN: [id number] Date of Birth: 06/28/1950   

## 2018-01-03 ENCOUNTER — Ambulatory Visit: Payer: BLUE CROSS/BLUE SHIELD | Attending: Surgery

## 2018-01-03 DIAGNOSIS — M25612 Stiffness of left shoulder, not elsewhere classified: Secondary | ICD-10-CM | POA: Diagnosis present

## 2018-01-03 DIAGNOSIS — Z170 Estrogen receptor positive status [ER+]: Secondary | ICD-10-CM | POA: Insufficient documentation

## 2018-01-03 DIAGNOSIS — M25611 Stiffness of right shoulder, not elsewhere classified: Secondary | ICD-10-CM | POA: Insufficient documentation

## 2018-01-03 DIAGNOSIS — Z483 Aftercare following surgery for neoplasm: Secondary | ICD-10-CM | POA: Diagnosis present

## 2018-01-03 DIAGNOSIS — C50511 Malignant neoplasm of lower-outer quadrant of right female breast: Secondary | ICD-10-CM | POA: Diagnosis not present

## 2018-01-03 DIAGNOSIS — R293 Abnormal posture: Secondary | ICD-10-CM | POA: Diagnosis present

## 2018-01-03 NOTE — Therapy (Signed)
[place], [place], [address] Phone: [phone]   Fax:  [phone]  Physical Therapy Treatment  Patient Details  Name: Sonia Sandoval MRN: [id number] Date of Birth: 1950-06-16 Referring Provider: Dr. [name]   Encounter Date: 01/03/2018  PT End of Session - 01/03/18 1653    Visit Number  3    Number of Visits  8    Date for PT Re-Evaluation  01/25/18    PT Start Time  1605    PT Stop Time  1647    PT Time Calculation (min)  42 min    Activity Tolerance  Patient tolerated treatment well    Behavior During Therapy  [facility] for tasks assessed/performed       Past Medical History:  Diagnosis Date  . Breast cancer, right breast ([place]) 09/2017   S/P mastectomy 12/05/2017  . PONV (postoperative nausea and vomiting)     Past Surgical History:  Procedure Laterality Date  . BACK SURGERY    . BREAST BIOPSY Right 09/2017  . [place]; 1992  . DILATION AND CURETTAGE OF UTERUS    . [place] SURGERY  2003  . MASTECTOMY Left 12/05/2017   PROPHYLACTIC MASTECTOMY  . MASTECTOMY COMPLETE / SIMPLE W/ SENTINEL NODE BIOPSY Right 12/05/2017   WITH RADIOACTIVE SEED TARGETED RIGHT AXIILARY LYMPH NODE EXCISION AND RIGHT SENTINEL LYMPH NODE BIOPSY,   . MASTECTOMY WITH RADIOACTIVE SEED GUIDED EXCISION AND AXILLARY SENTINEL LYMPH NODE BIOPSY Bilateral 12/05/2017   Procedure: RIGHT SIMPLE MASTECTOMY WITH RADIOACTIVE SEED TARGETED RIGHT AXIILARY LYMPH NODE EXCISION AND RIGHT SENTINEL LYMPH NODE BIOPSY, LEFT PROPHYLACTIC MASTECTOMY;  Surgeon: [name], MD;  Location: [place];  Service: General;  Laterality: Bilateral;    There were no vitals filed for this visit.  Subjective Assessment - 01/03/18 1609    Subjective  I've been doing the exercise she gave me last time and my Rt side is doing great, almost back to normal. My Lt side I think is a little better but definitely slower going. My incision opened  up at Lt lateral incision. I saw my surgeon yesterday and he isn't worried about it and in fact doesn't need to see me again for 3 months. He didn't give me any restrictions with ROM either.    Pertinent History  Patient was diagnosed on 10/19/17 with right invasive ductal carcinoma breast cancer. It is ER/PR positive and HER2 negative with a Ki67 of 10%. Patient underwent a bilateral mastectomy and right sentinel node biopsy on 12/05/17. She had 1/3 axillary nodes positive.     Patient Stated Goals  Get arm normal    Currently in Pain?  No/denies                       [facility] Adult PT Treatment/Exercise - 01/03/18 0001      Shoulder Exercises: Pulleys   Flexion  2 minutes    Flexion Limitations  Pt returned therapist demonstration    ABduction  2 minutes    ABduction Limitations  Pt returned therapist demo and VCs throughout to decrease trunk compensation      Manual Therapy   Manual Therapy  Myofascial release;Passive ROM    Myofascial Release  To Lt axilla but very gently as lateral aspect of pts incision opened over weekend;     Passive ROM  In Supine to Lt shoulder into flexion, abduction and er while stabilizing tissue/chest wall superior to incision (  flat hand over area) to decrease pull at lateral incision opening; very briefly same to Rt but very little to no stretch felt with full P/ROM so focused on Lt UE.                  PT Long Term Goals - 12/28/17 1226      PT LONG TERM GOAL #1   Title  Patient will demonstrate she has returned ot baseline related to shoulder ROM and function post operatively.    Time  4    Period  Weeks    Status  On-going      PT LONG TERM GOAL #2   Title  Increase left shoulder flexion to >/= 155 degrees for increased ease reaching overhead.    Time  4    Period  Weeks    Status  New      PT LONG TERM GOAL #3   Title  Increase left shoulder abduction to >/= 165 degrees for increased ease reaching overhead.    Time  4     Period  Weeks    Status  New      PT LONG TERM GOAL #4   Title  Patient will demonstrate she can obtain radiation positioning without increased axillary pain.    Time  4    Period  Weeks    Status  New      PT LONG TERM GOAL #5   Title  Patient will verbalize good understanding of lymphedema risk reduction practices.    Time  4    Period  Weeks    Status  New            Plan - 01/03/18 1653    Clinical Impression Statement  First session of stretching today including mostly P/ROM, and then briefly AA/ROM at end of session. Was very careful with myofascial release to Lt axilla during Lt shoulder P/ROM as opening was more noticeable at end ROM. Even at times placed flat hand over superior aspect of incision to prevent further opening. Instructed pt to cont with HEP over weekend but if she feels pull at incision to NOT stretch into that. She verbalized understanding,      Rehab Potential  Excellent    Clinical Impairments Affecting Rehab Potential  None    PT Frequency  2x / week    PT Duration  4 weeks    PT Treatment/Interventions  ADLs/Self Care Home Management;Therapeutic exercise;Patient/family education;Scar mobilization;Passive range of motion;Manual lymph drainage;Manual techniques;Therapeutic activities;DME Instruction    PT Next Visit Plan  PROM bil shoulders with left more than right being very mindful of Lt lateral aspect of incision; ROM exercises; scar massage when healed    Consulted and Agree with Plan of Care  Patient       Patient will benefit from skilled therapeutic intervention in order to improve the following deficits and impairments:  Decreased knowledge of precautions, Impaired UE functional use, Postural dysfunction, Pain, Decreased scar mobility, Increased edema, Impaired sensation, Decreased skin integrity, Decreased knowledge of use of DME, Decreased range of motion  Visit Diagnosis: Malignant neoplasm of lower-outer quadrant of right breast of female,  estrogen receptor positive ([facility])  Abnormal posture  Aftercare following surgery for neoplasm  Stiffness of left shoulder, not elsewhere classified  Stiffness of right shoulder, not elsewhere classified     Problem List Patient Active Problem List   Diagnosis Date Noted  . Breast cancer, stage 2, right ([place]) 12/05/2017  .  Malignant neoplasm of lower-outer quadrant of right breast of female, estrogen receptor positive ([place]) 10/27/2017    Otelia Limes, PTA 01/03/2018, 5:01 PM  [place], [place], [address] Phone: [phone]   Fax:  [phone]  Name: Sonia Sandoval MRN: [id number] Date of Birth: 01/06/1951

## 2018-01-05 ENCOUNTER — Ambulatory Visit
Admission: RE | Admit: 2018-01-05 | Discharge: 2018-01-05 | Disposition: A | Discharge status: Home / Self Care | Payer: BLUE CROSS/BLUE SHIELD | Source: Ambulatory Visit | Attending: Radiation Oncology | Admitting: Radiation Oncology

## 2018-01-05 DIAGNOSIS — Z510 Encounter for antineoplastic radiation therapy: Secondary | ICD-10-CM | POA: Insufficient documentation

## 2018-01-05 DIAGNOSIS — Z170 Estrogen receptor positive status [ER+]: Secondary | ICD-10-CM | POA: Diagnosis not present

## 2018-01-05 DIAGNOSIS — C50511 Malignant neoplasm of lower-outer quadrant of right female breast: Secondary | ICD-10-CM | POA: Diagnosis present

## 2018-01-08 ENCOUNTER — Encounter: Payer: Self-pay | Admitting: Rehabilitation

## 2018-01-08 ENCOUNTER — Ambulatory Visit: Payer: BLUE CROSS/BLUE SHIELD | Admitting: Rehabilitation

## 2018-01-08 DIAGNOSIS — Z170 Estrogen receptor positive status [ER+]: Principal | ICD-10-CM

## 2018-01-08 DIAGNOSIS — Z483 Aftercare following surgery for neoplasm: Secondary | ICD-10-CM

## 2018-01-08 DIAGNOSIS — C50511 Malignant neoplasm of lower-outer quadrant of right female breast: Secondary | ICD-10-CM

## 2018-01-08 NOTE — Therapy (Signed)
[place], [place], [address] Phone: [phone]   Fax:  [phone]  Physical Therapy Treatment  Patient Details  Name: Sonia Sandoval MRN: [id number] Date of Birth: 1950-07-10 Referring Provider: Dr. [name]   Encounter Date: 01/08/2018  PT End of Session - 01/08/18 1051    Visit Number  4    Number of Visits  8    Date for PT Re-Evaluation  01/25/18    PT Start Time  1020    PT Stop Time  1048    PT Time Calculation (min)  28 min    Activity Tolerance  Other (comment)   no treatment performed today      Past Medical History:  Diagnosis Date  . Breast cancer, right breast ([place]) 09/2017   S/P mastectomy 12/05/2017  . PONV (postoperative nausea and vomiting)     Past Surgical History:  Procedure Laterality Date  . BACK SURGERY    . BREAST BIOPSY Right 09/2017  . [place]; 1992  . DILATION AND CURETTAGE OF UTERUS    . [place] SURGERY  2003  . MASTECTOMY Left 12/05/2017   PROPHYLACTIC MASTECTOMY  . MASTECTOMY COMPLETE / SIMPLE W/ SENTINEL NODE BIOPSY Right 12/05/2017   WITH RADIOACTIVE SEED TARGETED RIGHT AXIILARY LYMPH NODE EXCISION AND RIGHT SENTINEL LYMPH NODE BIOPSY,   . MASTECTOMY WITH RADIOACTIVE SEED GUIDED EXCISION AND AXILLARY SENTINEL LYMPH NODE BIOPSY Bilateral 12/05/2017   Procedure: RIGHT SIMPLE MASTECTOMY WITH RADIOACTIVE SEED TARGETED RIGHT AXIILARY LYMPH NODE EXCISION AND RIGHT SENTINEL LYMPH NODE BIOPSY, LEFT PROPHYLACTIC MASTECTOMY;  Surgeon: [name], MD;  Location: [facility];  Service: General;  Laterality: Bilateral;    There were no vitals filed for this visit.  Subjective Assessment - 01/08/18 1015    Subjective  Feeling fine.  Felt wonderful after last time.  The spot feels a bit more open    Pertinent History  Patient was diagnosed on 10/19/17 with right invasive ductal carcinoma breast cancer. It is ER/PR positive and HER2 negative with a Ki67  of 10%. Patient underwent a bilateral mastectomy and right sentinel node biopsy on 12/05/17. She had 1/3 axillary nodes positive.     Currently in Pain?  No/denies         [facility] PT Assessment - 01/08/18 0001      Observation/Other Assessments   Observations  Lt most lateral incision near the axilla is now open with a white necrotic tissue that is draining and wet.  The opening seems to extend a bit more laterally per PTA. MD office called. Nurse wanted pt to get in this afternoon or tomorrow but pt unable to due to husband having surgery tomorrow morning.  Pt advised to get in as soon as possible, keep it covered with a bandage, D/C use of foam, and not stretch for now.  PT will immediately call with any fever or worsening of site.                                    PT Long Term Goals - 12/28/17 1226      PT LONG TERM GOAL #1   Title  Patient will demonstrate she has returned ot baseline related to shoulder ROM and function post operatively.    Time  4    Period  Weeks    Status  On-going  PT LONG TERM GOAL #2   Title  Increase left shoulder flexion to >/= 155 degrees for increased ease reaching overhead.    Time  4    Period  Weeks    Status  New      PT LONG TERM GOAL #3   Title  Increase left shoulder abduction to >/= 165 degrees for increased ease reaching overhead.    Time  4    Period  Weeks    Status  New      PT LONG TERM GOAL #4   Title  Patient will demonstrate she can obtain radiation positioning without increased axillary pain.    Time  4    Period  Weeks    Status  New      PT LONG TERM GOAL #5   Title  Patient will verbalize good understanding of lymphedema risk reduction practices.    Time  4    Period  Weeks    Status  New            Plan - 01/08/18 1052    Clinical Impression Statement  Lt most lateral incision near the axilla is now open with a white necrotic tissue that is draining and wet.  The opening seems to  extend a bit more laterally per PTA. MD office called. Nurse wanted pt to get in this afternoon or tomorrow but pt unable to due to husband having surgery tomorrow morning.  Pt advised to get in as soon as possible, keep it covered with a bandage, D/C use of foam, and not stretch for now.  PT will immediately call with any fever or worsening of site.      PT Frequency  2x / week    PT Duration  4 weeks    PT Next Visit Plan  When pt returns; PROM bil shoulders with left more than right being very mindful of Lt lateral aspect of incision; ROM exercises; scar massage when healed       Patient will benefit from skilled therapeutic intervention in order to improve the following deficits and impairments:  Decreased knowledge of precautions, Impaired UE functional use, Postural dysfunction, Pain, Decreased scar mobility, Increased edema, Impaired sensation, Decreased skin integrity, Decreased knowledge of use of DME, Decreased range of motion  Visit Diagnosis: Malignant neoplasm of lower-outer quadrant of right breast of female, estrogen receptor positive [facility])  Aftercare following surgery for neoplasm     Problem List Patient Active Problem List   Diagnosis Date Noted  . Breast cancer, stage 2, right ([place]) 12/05/2017  . Malignant neoplasm of lower-outer quadrant of right breast of female, estrogen receptor positive ([place]) 10/27/2017    Stark Bray 01/08/2018, 10:53 AM  [place], [place], [address] Phone: [phone]   Fax:  [phone]  Name: MADELIN WESEMAN MRN: [id number] Date of Birth: 1950/06/16

## 2018-01-08 NOTE — Patient Instructions (Signed)
Lt most lateral incision near the axilla is now open with a white necrotic tissue that is draining and wet.  The opening seems to extend a bit more laterally per PTA. MD office called. Nurse wanted pt to get in this afternoon or tomorrow but pt unable to due to husband having surgery tomorrow morning.  Pt advised to get in as soon as possible, keep it covered with a bandage, D/C use of foam, and not stretch for now.  PT will immediately call with any fever or worsening of site.

## 2018-01-09 NOTE — Progress Notes (Signed)
  Radiation Oncology         [phone] ________________________________  Name: Sonia Sandoval MRN: [id number]  Date: 01/05/2018  DOB: 1950-08-22  DIAGNOSIS:     ICD-10-CM   1. Malignant neoplasm of lower-outer quadrant of right breast of female, estrogen receptor positive ([place]) C50.511    Z17.0      SIMULATION AND TREATMENT PLANNING NOTE  The patient presented for simulation prior to beginning her course of radiation treatment for her diagnosis of right-sided breast cancer. The patient was placed in a supine position on a breast board. A customized vac-lock bag was also constructed and this complex treatment device will be used on a daily basis during her treatment. In this fashion, a CT scan was obtained through the chest area and an isocenter was placed near the chest wall at the upper aspect of the right chest.  The patient will be planned to receive a course of radiation initially to a dose of 50.4 gray. This will consist of a 4 field technique targeting the right chest wall as well as the supraclavicular region. Therefore 2 customized medial and lateral tangent fields have been created targeting the chest wall, and also 2 additional customized fields have been designed to treat the supraclavicular region both with a right supraclavicular field and a right posterior axillary boost field. A forward planning/reduced field technique will also be evaluated to determine if this significantly improves the dose homogeneity of the overall plan. Therefore, additional customized blocks/fields may be necessary.  This initial treatment will be accomplished at 1.8 gray per fraction.   The initial plan will consist of a 3-D conformal technique. The target volume/scar, heart and lungs have been contoured and dose volume histograms of each of these structures will be evaluated as part of the 3-D conformal treatment planning process.   It is anticipated that the patient will then receive a 10 gray boost to  the surgical scar. This will be accomplished at 2 gray per fraction. The final anticipated total dose therefore will correspond to 60.4 gray.    _______________________________   Jodelle Gross, MD, PhD

## 2018-01-09 NOTE — Progress Notes (Signed)
  Radiation Oncology         [phone] ________________________________  Name: NEVAEH KORTE MRN: [id number]  Date: 01/05/2018  DOB: 01/09/1951  Optical Surface Tracking Plan:  Since intensity modulated radiotherapy (IMRT) and 3D conformal radiation treatment methods are predicated on accurate and precise positioning for treatment, intrafraction motion monitoring is medically necessary to ensure accurate and safe treatment delivery.  The ability to quantify intrafraction motion without excessive ionizing radiation dose can only be performed with optical surface tracking. Accordingly, surface imaging offers the opportunity to obtain 3D measurements of patient position throughout IMRT and 3D treatments without excessive radiation exposure.  I am ordering optical surface tracking for this patient's upcoming course of radiotherapy. ________________________________  [name], MD 01/09/2018 9:05 AM    Reference:   Ursula Alert, J, et al. Surface imaging-based analysis of intrafraction motion for breast radiotherapy patients.Journal of [place], n. 6, nov. 2014. ISSN [id number].   Available at: <http://www.jacmp.org/index.php/jacmp/article/view/4957>.

## 2018-01-12 ENCOUNTER — Encounter: Payer: BLUE CROSS/BLUE SHIELD | Admitting: Rehabilitation

## 2018-01-12 DIAGNOSIS — Z510 Encounter for antineoplastic radiation therapy: Secondary | ICD-10-CM | POA: Diagnosis not present

## 2018-01-15 ENCOUNTER — Ambulatory Visit
Admission: RE | Admit: 2018-01-15 | Discharge: 2018-01-15 | Disposition: A | Discharge status: Home / Self Care | Payer: BLUE CROSS/BLUE SHIELD | Source: Ambulatory Visit | Attending: Radiation Oncology | Admitting: Radiation Oncology

## 2018-01-15 ENCOUNTER — Telehealth: Payer: Self-pay | Admitting: Oncology

## 2018-01-15 ENCOUNTER — Ambulatory Visit: Payer: BLUE CROSS/BLUE SHIELD | Admitting: Rehabilitation

## 2018-01-15 ENCOUNTER — Encounter: Payer: Self-pay | Admitting: Rehabilitation

## 2018-01-15 DIAGNOSIS — Z483 Aftercare following surgery for neoplasm: Secondary | ICD-10-CM

## 2018-01-15 DIAGNOSIS — M25612 Stiffness of left shoulder, not elsewhere classified: Secondary | ICD-10-CM

## 2018-01-15 DIAGNOSIS — Z510 Encounter for antineoplastic radiation therapy: Secondary | ICD-10-CM | POA: Diagnosis not present

## 2018-01-15 DIAGNOSIS — C50511 Malignant neoplasm of lower-outer quadrant of right female breast: Secondary | ICD-10-CM

## 2018-01-15 DIAGNOSIS — Z170 Estrogen receptor positive status [ER+]: Principal | ICD-10-CM

## 2018-01-15 DIAGNOSIS — M25611 Stiffness of right shoulder, not elsewhere classified: Secondary | ICD-10-CM

## 2018-01-15 DIAGNOSIS — R293 Abnormal posture: Secondary | ICD-10-CM

## 2018-01-15 NOTE — Telephone Encounter (Signed)
Spoke to pt regarding upcoming appts  Per 9/9 sch message

## 2018-01-15 NOTE — Therapy (Addendum)
[place], [place], [address] Phone: [phone]   Fax:  [phone]  Physical Therapy Treatment  Patient Details  Name: Sonia Sandoval MRN: [id number] Date of Birth: 1951/04/10 Referring Provider: Dr. [name]   Encounter Date: 01/15/2018  PT End of Session - 01/15/18 1100    Visit Number  5    Number of Visits  8    Date for PT Re-Evaluation  01/25/18    PT Start Time  1020    PT Stop Time  1100    PT Time Calculation (min)  40 min    Activity Tolerance  Patient tolerated treatment well    Behavior During Therapy  [facility] for tasks assessed/performed       Past Medical History:  Diagnosis Date  . Breast cancer, right breast ([place]) 09/2017   S/P mastectomy 12/05/2017  . PONV (postoperative nausea and vomiting)     Past Surgical History:  Procedure Laterality Date  . BACK SURGERY    . BREAST BIOPSY Right 09/2017  . [place]; 1992  . DILATION AND CURETTAGE OF UTERUS    . [place] SURGERY  2003  . MASTECTOMY Left 12/05/2017   PROPHYLACTIC MASTECTOMY  . MASTECTOMY COMPLETE / SIMPLE W/ SENTINEL NODE BIOPSY Right 12/05/2017   WITH RADIOACTIVE SEED TARGETED RIGHT AXIILARY LYMPH NODE EXCISION AND RIGHT SENTINEL LYMPH NODE BIOPSY,   . MASTECTOMY WITH RADIOACTIVE SEED GUIDED EXCISION AND AXILLARY SENTINEL LYMPH NODE BIOPSY Bilateral 12/05/2017   Procedure: RIGHT SIMPLE MASTECTOMY WITH RADIOACTIVE SEED TARGETED RIGHT AXIILARY LYMPH NODE EXCISION AND RIGHT SENTINEL LYMPH NODE BIOPSY, LEFT PROPHYLACTIC MASTECTOMY;  Surgeon: [name], MD;  Location: [facility] [place];  Service: General;  Laterality: Bilateral;    There were no vitals filed for this visit.  Subjective Assessment - 01/15/18 1021    Subjective  The MD said everything was fine.  He said it is fine to keep doing therapy.  Just put neosporin on it.      Pertinent History  Patient was diagnosed on 10/19/17 with right invasive ductal  carcinoma breast cancer. It is ER/PR positive and HER2 negative with a Ki67 of 10%. Patient underwent a bilateral mastectomy and right sentinel node biopsy on 12/05/17. She had 1/3 axillary nodes positive.     Patient Stated Goals  Get L arm normal with reaching and transition to weights      Currently in Pain?  No/denies         [facility] PT Assessment - 01/15/18 0001      ROM / Strength   AROM / PROM / Strength  Strength      AROM   Left Shoulder Flexion  165 Degrees    Left Shoulder ABduction  162 Degrees      Strength   Overall Strength Comments  strength is 5/5 globally     Strength Assessment Site  Shoulder    Right/Left Shoulder  Right;Left                   [facility] Adult PT Treatment/Exercise - 01/15/18 0001      Exercises   Exercises  Shoulder      Shoulder Exercises: Supine   Protraction  Both;10 reps   using purple ball   Horizontal ABduction  10 reps;Theraband    Theraband Level (Shoulder Horizontal ABduction)  Level 1 (Yellow)    External Rotation  Both;15 reps;Theraband    Theraband Level (Shoulder External Rotation)  Level  1 (Yellow)    Other Supine Exercises  arms at 90deg flexion horizontal adduction isometric 6"x10       Manual Therapy   Manual Therapy  Passive ROM    Passive ROM  In Supine to Lt shoulder into flexion, abduction and er while stabilizing tissue/chest wall superior to incision (flat hand over area) to decrease pull at lateral incision opening; very briefly same to Rt but very little to no stretch felt with full P/ROM so focused on Lt UE.                    PT Long Term Goals - 12/28/17 1226      PT LONG TERM GOAL #1   Title  Patient will demonstrate she has returned ot baseline related to shoulder ROM and function post operatively.    Time  4    Period  Weeks    Status  On-going      PT LONG TERM GOAL #2   Title  Increase left shoulder flexion to >/= 155 degrees for increased ease reaching overhead.    Time  4     Period  Weeks    Status  New      PT LONG TERM GOAL #3   Title  Increase left shoulder abduction to >/= 165 degrees for increased ease reaching overhead.    Time  4    Period  Weeks    Status  New      PT LONG TERM GOAL #4   Title  Patient will demonstrate she can obtain radiation positioning without increased axillary pain.    Time  4    Period  Weeks    Status  New      PT LONG TERM GOAL #5   Title  Patient will verbalize good understanding of lymphedema risk reduction practices.    Time  4    Period  Weeks    Status  New            Plan - 01/15/18 1101    Clinical Impression Statement  Incision is still open on the Lt lateral side near the axilla but not as red and wet today.  See picture in the note.  The MD also told her it was fine to continue.  Performed PROM with Lt hand blocking tissue excursion and exercises to minimize pull here.  Strength is excellent in all directions.      PT Frequency  2x / week    PT Duration  4 weeks    PT Treatment/Interventions  ADLs/Self Care Home Management;Therapeutic exercise;Patient/family education;Scar mobilization;Passive range of motion;Manual lymph drainage;Manual techniques;Therapeutic activities;DME Instruction    PT Next Visit Plan  When pt returns; PROM bil shoulders with left more than right being very mindful of Lt lateral aspect of incision; ROM exercises; scar massage when healed    Consulted and Agree with Plan of Care  Patient       Patient will benefit from skilled therapeutic intervention in order to improve the following deficits and impairments:  Decreased knowledge of precautions, Impaired UE functional use, Postural dysfunction, Pain, Decreased scar mobility, Increased edema, Impaired sensation, Decreased skin integrity, Decreased knowledge of use of DME, Decreased range of motion  Visit Diagnosis: Malignant neoplasm of lower-outer quadrant of right breast of female, estrogen receptor positive ([place])  Aftercare  following surgery for neoplasm  Abnormal posture  Stiffness of left shoulder, not elsewhere classified  Stiffness of right shoulder, not elsewhere classified  Problem List Patient Active Problem List   Diagnosis Date Noted  . Breast cancer, stage 2, right ([place]) 12/05/2017  . Malignant neoplasm of lower-outer quadrant of right breast of female, estrogen receptor positive ([place]) 10/27/2017    Shan Levans, PT 01/15/2018, 11:03 AM  [place], [place], [address] Phone: [phone]   Fax:  [phone]  Name: Sonia Sandoval MRN: [id number] Date of Birth: June 06, 1950   PHYSICAL THERAPY DISCHARGE SUMMARY  Visits from Start of Care: 6  Current functional level related to goals / functional outcomes: Pt stopped due to radiation starting and did not return after completion. Pt MD follow up note reporting that she is doing well.     Remaining deficits: Need for continued strength and ROM    Education / Equipment:  Plan: Patient agrees to discharge.  Patient goals were partially met. Patient is being discharged due to not returning since the last visit.  ?????     Shan Levans, PT 07/16/18

## 2018-01-16 ENCOUNTER — Ambulatory Visit
Admission: RE | Admit: 2018-01-16 | Discharge: 2018-01-16 | Disposition: A | Discharge status: Home / Self Care | Payer: BLUE CROSS/BLUE SHIELD | Source: Ambulatory Visit | Attending: Radiation Oncology | Admitting: Radiation Oncology

## 2018-01-16 DIAGNOSIS — Z510 Encounter for antineoplastic radiation therapy: Secondary | ICD-10-CM | POA: Diagnosis not present

## 2018-01-17 ENCOUNTER — Ambulatory Visit
Admission: RE | Admit: 2018-01-17 | Discharge: 2018-01-17 | Disposition: A | Discharge status: Home / Self Care | Payer: BLUE CROSS/BLUE SHIELD | Source: Ambulatory Visit | Attending: Radiation Oncology | Admitting: Radiation Oncology

## 2018-01-17 DIAGNOSIS — Z510 Encounter for antineoplastic radiation therapy: Secondary | ICD-10-CM | POA: Diagnosis not present

## 2018-01-18 ENCOUNTER — Ambulatory Visit
Admission: RE | Admit: 2018-01-18 | Discharge: 2018-01-18 | Disposition: A | Discharge status: Home / Self Care | Payer: BLUE CROSS/BLUE SHIELD | Source: Ambulatory Visit | Attending: Radiation Oncology | Admitting: Radiation Oncology

## 2018-01-18 DIAGNOSIS — Z510 Encounter for antineoplastic radiation therapy: Secondary | ICD-10-CM | POA: Diagnosis not present

## 2018-01-19 ENCOUNTER — Ambulatory Visit
Admission: RE | Admit: 2018-01-19 | Discharge: 2018-01-19 | Disposition: A | Discharge status: Home / Self Care | Payer: BLUE CROSS/BLUE SHIELD | Source: Ambulatory Visit | Attending: Radiation Oncology | Admitting: Radiation Oncology

## 2018-01-19 ENCOUNTER — Ambulatory Visit: Payer: BLUE CROSS/BLUE SHIELD | Admitting: Physical Therapy

## 2018-01-19 DIAGNOSIS — Z510 Encounter for antineoplastic radiation therapy: Secondary | ICD-10-CM | POA: Diagnosis not present

## 2018-01-22 ENCOUNTER — Ambulatory Visit
Admission: RE | Admit: 2018-01-22 | Discharge: 2018-01-22 | Disposition: A | Discharge status: Home / Self Care | Payer: BLUE CROSS/BLUE SHIELD | Source: Ambulatory Visit | Attending: Radiation Oncology | Admitting: Radiation Oncology

## 2018-01-22 DIAGNOSIS — Z510 Encounter for antineoplastic radiation therapy: Secondary | ICD-10-CM | POA: Diagnosis not present

## 2018-01-23 ENCOUNTER — Ambulatory Visit
Admission: RE | Admit: 2018-01-23 | Discharge: 2018-01-23 | Disposition: A | Discharge status: Home / Self Care | Payer: BLUE CROSS/BLUE SHIELD | Source: Ambulatory Visit | Attending: Radiation Oncology | Admitting: Radiation Oncology

## 2018-01-23 DIAGNOSIS — Z510 Encounter for antineoplastic radiation therapy: Secondary | ICD-10-CM | POA: Diagnosis not present

## 2018-01-24 ENCOUNTER — Ambulatory Visit
Admission: RE | Admit: 2018-01-24 | Discharge: 2018-01-24 | Disposition: A | Discharge status: Home / Self Care | Payer: BLUE CROSS/BLUE SHIELD | Source: Ambulatory Visit | Attending: Radiation Oncology | Admitting: Radiation Oncology

## 2018-01-24 DIAGNOSIS — Z510 Encounter for antineoplastic radiation therapy: Secondary | ICD-10-CM | POA: Diagnosis not present

## 2018-01-25 ENCOUNTER — Ambulatory Visit
Admission: RE | Admit: 2018-01-25 | Discharge: 2018-01-25 | Disposition: A | Discharge status: Home / Self Care | Payer: BLUE CROSS/BLUE SHIELD | Source: Ambulatory Visit | Attending: Radiation Oncology | Admitting: Radiation Oncology

## 2018-01-25 DIAGNOSIS — Z510 Encounter for antineoplastic radiation therapy: Secondary | ICD-10-CM | POA: Diagnosis not present

## 2018-01-26 ENCOUNTER — Ambulatory Visit
Admission: RE | Admit: 2018-01-26 | Discharge: 2018-01-26 | Disposition: A | Discharge status: Home / Self Care | Payer: BLUE CROSS/BLUE SHIELD | Source: Ambulatory Visit | Attending: Radiation Oncology | Admitting: Radiation Oncology

## 2018-01-26 DIAGNOSIS — Z510 Encounter for antineoplastic radiation therapy: Secondary | ICD-10-CM | POA: Diagnosis not present

## 2018-01-29 ENCOUNTER — Ambulatory Visit
Admission: RE | Admit: 2018-01-29 | Discharge: 2018-01-29 | Disposition: A | Discharge status: Home / Self Care | Payer: BLUE CROSS/BLUE SHIELD | Source: Ambulatory Visit | Attending: Radiation Oncology | Admitting: Radiation Oncology

## 2018-01-29 DIAGNOSIS — Z510 Encounter for antineoplastic radiation therapy: Secondary | ICD-10-CM | POA: Diagnosis not present

## 2018-01-30 ENCOUNTER — Ambulatory Visit
Admission: RE | Admit: 2018-01-30 | Discharge: 2018-01-30 | Disposition: A | Discharge status: Home / Self Care | Payer: BLUE CROSS/BLUE SHIELD | Source: Ambulatory Visit | Attending: Radiation Oncology | Admitting: Radiation Oncology

## 2018-01-30 DIAGNOSIS — Z510 Encounter for antineoplastic radiation therapy: Secondary | ICD-10-CM | POA: Diagnosis not present

## 2018-01-30 DIAGNOSIS — C50511 Malignant neoplasm of lower-outer quadrant of right female breast: Secondary | ICD-10-CM | POA: Diagnosis present

## 2018-01-30 DIAGNOSIS — Z170 Estrogen receptor positive status [ER+]: Secondary | ICD-10-CM | POA: Diagnosis not present

## 2018-01-31 ENCOUNTER — Ambulatory Visit
Admission: RE | Admit: 2018-01-31 | Discharge: 2018-01-31 | Disposition: A | Discharge status: Home / Self Care | Payer: BLUE CROSS/BLUE SHIELD | Source: Ambulatory Visit | Attending: Radiation Oncology | Admitting: Radiation Oncology

## 2018-01-31 DIAGNOSIS — Z510 Encounter for antineoplastic radiation therapy: Secondary | ICD-10-CM | POA: Diagnosis not present

## 2018-02-01 ENCOUNTER — Ambulatory Visit
Admission: RE | Admit: 2018-02-01 | Discharge: 2018-02-01 | Disposition: A | Discharge status: Home / Self Care | Payer: BLUE CROSS/BLUE SHIELD | Source: Ambulatory Visit | Attending: Radiation Oncology | Admitting: Radiation Oncology

## 2018-02-01 DIAGNOSIS — Z510 Encounter for antineoplastic radiation therapy: Secondary | ICD-10-CM | POA: Diagnosis not present

## 2018-02-02 ENCOUNTER — Ambulatory Visit
Admission: RE | Admit: 2018-02-02 | Discharge: 2018-02-02 | Disposition: A | Discharge status: Home / Self Care | Payer: BLUE CROSS/BLUE SHIELD | Source: Ambulatory Visit | Attending: Radiation Oncology | Admitting: Radiation Oncology

## 2018-02-02 DIAGNOSIS — Z510 Encounter for antineoplastic radiation therapy: Secondary | ICD-10-CM | POA: Diagnosis not present

## 2018-02-05 ENCOUNTER — Ambulatory Visit
Admission: RE | Admit: 2018-02-05 | Discharge: 2018-02-05 | Disposition: A | Discharge status: Home / Self Care | Payer: BLUE CROSS/BLUE SHIELD | Source: Ambulatory Visit | Attending: Radiation Oncology | Admitting: Radiation Oncology

## 2018-02-05 DIAGNOSIS — Z510 Encounter for antineoplastic radiation therapy: Secondary | ICD-10-CM | POA: Diagnosis not present

## 2018-02-06 ENCOUNTER — Ambulatory Visit
Admission: RE | Admit: 2018-02-06 | Discharge: 2018-02-06 | Disposition: A | Discharge status: Home / Self Care | Payer: BLUE CROSS/BLUE SHIELD | Source: Ambulatory Visit | Attending: Radiation Oncology | Admitting: Radiation Oncology

## 2018-02-06 DIAGNOSIS — Z510 Encounter for antineoplastic radiation therapy: Secondary | ICD-10-CM | POA: Diagnosis not present

## 2018-02-07 ENCOUNTER — Ambulatory Visit
Admission: RE | Admit: 2018-02-07 | Discharge: 2018-02-07 | Disposition: A | Discharge status: Home / Self Care | Payer: BLUE CROSS/BLUE SHIELD | Source: Ambulatory Visit | Attending: Radiation Oncology | Admitting: Radiation Oncology

## 2018-02-07 DIAGNOSIS — Z510 Encounter for antineoplastic radiation therapy: Secondary | ICD-10-CM | POA: Diagnosis not present

## 2018-02-08 ENCOUNTER — Ambulatory Visit
Admission: RE | Admit: 2018-02-08 | Discharge: 2018-02-08 | Disposition: A | Discharge status: Home / Self Care | Payer: BLUE CROSS/BLUE SHIELD | Source: Ambulatory Visit | Attending: Radiation Oncology | Admitting: Radiation Oncology

## 2018-02-08 DIAGNOSIS — Z510 Encounter for antineoplastic radiation therapy: Secondary | ICD-10-CM | POA: Diagnosis not present

## 2018-02-09 ENCOUNTER — Ambulatory Visit
Admission: RE | Admit: 2018-02-09 | Discharge: 2018-02-09 | Disposition: A | Discharge status: Home / Self Care | Payer: BLUE CROSS/BLUE SHIELD | Source: Ambulatory Visit | Attending: Radiation Oncology | Admitting: Radiation Oncology

## 2018-02-09 DIAGNOSIS — Z510 Encounter for antineoplastic radiation therapy: Secondary | ICD-10-CM | POA: Diagnosis not present

## 2018-02-12 ENCOUNTER — Ambulatory Visit
Admission: RE | Admit: 2018-02-12 | Discharge: 2018-02-12 | Disposition: A | Discharge status: Home / Self Care | Payer: BLUE CROSS/BLUE SHIELD | Source: Ambulatory Visit | Attending: Radiation Oncology | Admitting: Radiation Oncology

## 2018-02-12 DIAGNOSIS — Z510 Encounter for antineoplastic radiation therapy: Secondary | ICD-10-CM | POA: Diagnosis not present

## 2018-02-13 ENCOUNTER — Ambulatory Visit
Admission: RE | Admit: 2018-02-13 | Discharge: 2018-02-13 | Disposition: A | Discharge status: Home / Self Care | Payer: BLUE CROSS/BLUE SHIELD | Source: Ambulatory Visit | Attending: Radiation Oncology | Admitting: Radiation Oncology

## 2018-02-13 DIAGNOSIS — Z510 Encounter for antineoplastic radiation therapy: Secondary | ICD-10-CM | POA: Diagnosis not present

## 2018-02-14 ENCOUNTER — Ambulatory Visit
Admission: RE | Admit: 2018-02-14 | Discharge: 2018-02-14 | Disposition: A | Discharge status: Home / Self Care | Payer: BLUE CROSS/BLUE SHIELD | Source: Ambulatory Visit | Attending: Radiation Oncology | Admitting: Radiation Oncology

## 2018-02-14 DIAGNOSIS — Z510 Encounter for antineoplastic radiation therapy: Secondary | ICD-10-CM | POA: Diagnosis not present

## 2018-02-15 ENCOUNTER — Ambulatory Visit: Admission: RE | Admit: 2018-02-15 | Payer: BLUE CROSS/BLUE SHIELD | Source: Ambulatory Visit

## 2018-02-16 ENCOUNTER — Ambulatory Visit
Admission: RE | Admit: 2018-02-16 | Discharge: 2018-02-16 | Disposition: A | Discharge status: Home / Self Care | Payer: BLUE CROSS/BLUE SHIELD | Source: Ambulatory Visit | Attending: Radiation Oncology | Admitting: Radiation Oncology

## 2018-02-16 DIAGNOSIS — Z510 Encounter for antineoplastic radiation therapy: Secondary | ICD-10-CM | POA: Diagnosis not present

## 2018-02-16 DIAGNOSIS — C50511 Malignant neoplasm of lower-outer quadrant of right female breast: Secondary | ICD-10-CM

## 2018-02-16 DIAGNOSIS — Z170 Estrogen receptor positive status [ER+]: Principal | ICD-10-CM

## 2018-02-16 MED ORDER — RADIAPLEXRX EX GEL
Freq: Once | CUTANEOUS | Status: AC
  Administered 2018-02-16: 17:00:00 via TOPICAL

## 2018-02-19 ENCOUNTER — Ambulatory Visit
Admission: RE | Admit: 2018-02-19 | Discharge: 2018-02-19 | Disposition: A | Discharge status: Home / Self Care | Payer: BLUE CROSS/BLUE SHIELD | Source: Ambulatory Visit | Attending: Radiation Oncology | Admitting: Radiation Oncology

## 2018-02-19 DIAGNOSIS — Z510 Encounter for antineoplastic radiation therapy: Secondary | ICD-10-CM | POA: Diagnosis not present

## 2018-02-20 ENCOUNTER — Ambulatory Visit
Admission: RE | Admit: 2018-02-20 | Discharge: 2018-02-20 | Disposition: A | Discharge status: Home / Self Care | Payer: BLUE CROSS/BLUE SHIELD | Source: Ambulatory Visit | Attending: Radiation Oncology | Admitting: Radiation Oncology

## 2018-02-20 DIAGNOSIS — Z510 Encounter for antineoplastic radiation therapy: Secondary | ICD-10-CM | POA: Diagnosis not present

## 2018-02-21 ENCOUNTER — Ambulatory Visit
Admission: RE | Admit: 2018-02-21 | Discharge: 2018-02-21 | Disposition: A | Discharge status: Home / Self Care | Payer: BLUE CROSS/BLUE SHIELD | Source: Ambulatory Visit | Attending: Radiation Oncology | Admitting: Radiation Oncology

## 2018-02-21 DIAGNOSIS — Z510 Encounter for antineoplastic radiation therapy: Secondary | ICD-10-CM | POA: Diagnosis not present

## 2018-02-22 ENCOUNTER — Ambulatory Visit: Payer: BLUE CROSS/BLUE SHIELD | Admitting: Radiation Oncology

## 2018-02-22 ENCOUNTER — Ambulatory Visit
Admission: RE | Admit: 2018-02-22 | Discharge: 2018-02-22 | Disposition: A | Discharge status: Home / Self Care | Payer: BLUE CROSS/BLUE SHIELD | Source: Ambulatory Visit | Attending: Radiation Oncology | Admitting: Radiation Oncology

## 2018-02-22 ENCOUNTER — Ambulatory Visit: Payer: BLUE CROSS/BLUE SHIELD

## 2018-02-22 DIAGNOSIS — Z510 Encounter for antineoplastic radiation therapy: Secondary | ICD-10-CM | POA: Diagnosis not present

## 2018-02-23 ENCOUNTER — Ambulatory Visit: Payer: BLUE CROSS/BLUE SHIELD

## 2018-02-23 ENCOUNTER — Ambulatory Visit
Admission: RE | Admit: 2018-02-23 | Discharge: 2018-02-23 | Disposition: A | Discharge status: Home / Self Care | Payer: BLUE CROSS/BLUE SHIELD | Source: Ambulatory Visit | Attending: Radiation Oncology | Admitting: Radiation Oncology

## 2018-02-23 DIAGNOSIS — Z510 Encounter for antineoplastic radiation therapy: Secondary | ICD-10-CM | POA: Diagnosis not present

## 2018-02-26 ENCOUNTER — Ambulatory Visit
Admission: RE | Admit: 2018-02-26 | Discharge: 2018-02-26 | Disposition: A | Discharge status: Home / Self Care | Payer: BLUE CROSS/BLUE SHIELD | Source: Ambulatory Visit | Attending: Radiation Oncology | Admitting: Radiation Oncology

## 2018-02-26 DIAGNOSIS — Z510 Encounter for antineoplastic radiation therapy: Secondary | ICD-10-CM | POA: Diagnosis not present

## 2018-02-27 ENCOUNTER — Ambulatory Visit
Admission: RE | Admit: 2018-02-27 | Discharge: 2018-02-27 | Disposition: A | Discharge status: Home / Self Care | Payer: BLUE CROSS/BLUE SHIELD | Source: Ambulatory Visit | Attending: Radiation Oncology | Admitting: Radiation Oncology

## 2018-02-27 ENCOUNTER — Encounter: Payer: Self-pay | Admitting: Radiation Oncology

## 2018-02-27 ENCOUNTER — Inpatient Hospital Stay: Payer: BLUE CROSS/BLUE SHIELD | Attending: Oncology | Admitting: Oncology

## 2018-02-27 ENCOUNTER — Telehealth: Payer: Self-pay | Admitting: Oncology

## 2018-02-27 VITALS — BP 135/71 | HR 74 | Temp 98.3°F | Resp 18 | Ht 62.5 in | Wt 143.8 lb

## 2018-02-27 DIAGNOSIS — C50511 Malignant neoplasm of lower-outer quadrant of right female breast: Secondary | ICD-10-CM | POA: Insufficient documentation

## 2018-02-27 DIAGNOSIS — Z170 Estrogen receptor positive status [ER+]: Secondary | ICD-10-CM | POA: Insufficient documentation

## 2018-02-27 DIAGNOSIS — Z510 Encounter for antineoplastic radiation therapy: Secondary | ICD-10-CM | POA: Diagnosis not present

## 2018-02-27 DIAGNOSIS — Z9013 Acquired absence of bilateral breasts and nipples: Secondary | ICD-10-CM | POA: Diagnosis not present

## 2018-02-27 MED ORDER — ANASTROZOLE 1 MG PO TABS: 1.0000 mg | ORAL_TABLET | Freq: Every day | ORAL | 4 refills | Status: DC

## 2018-02-27 NOTE — Telephone Encounter (Signed)
Gave patient avs and calendar.   °

## 2018-02-27 NOTE — Progress Notes (Signed)
[place]  [phone] [phone]) [phone]     ID: BONITA BRINDISI DOB: December 02, 1950  MR#: [id number]  ZYS#:[id number]  Patient Care Team: [name], [place] as PCP - General (Nurse Practitioner) [name], MD as Consulting Physician (General Surgery) Devi Hopman, [name], MD as Consulting Physician (Oncology) [name], MD as Consulting Physician (Radiation Oncology) OTHER MD:  CHIEF COMPLAINT: Estrogen receptor positive breast cancer  CURRENT TREATMENT: To start anastrozole 04/01/2018   HISTORY OF CURRENT ILLNESS: From the original intake note:  "[name]" felt that her right breast was noticeably smaller than the left. She brought this to her nurse practitioner's attention.  She had not had mammography in several years.  [name] was set up for bilateral diagnostic mammography with tomography and right breast ultrasonography at [facility] on 10/19/2017 showing: Breast density category B.  Highly suspicious mass in the right lower outer quadrant measuring 3.3 x 2.8 x 3.2 cm,  2 cm from the nipple with associated skin retraction. The right axilla showed a 9 mm lymph node with cortical thickening. There was no evidence of malignancy in the left breast.   Accordingly on 10/23/2017 she proceeded to biopsy of the right breast area in question. The pathology from this procedure showed ([id number]): Invasive ductal carcinoma grade III. The right axillary lymph node was also positive.  There was no lymph node tissue identified. Prognostic indicators significant for: estrogen receptor, 95% positive and progesterone receptor, 45% positive, both with strong staining intensity. Proliferation marker Ki67 at 20%. HER2  not amplified with ratios HER2/CEP17 signals 1.25 and average HER2 copies per cell 3.00  The patient's subsequent history is as detailed below.  INTERVAL HISTORY: Solange returns for follow up and treatment for her estrogen receptor positive breast  cancer.  Since her last visit, she underwent bilateral mastectomy on 12/05/2017. Pathology report from the procedure shows: invasive ductal carcinoma, grade 3, 4.0 cm; lymphovascular space invasion present; metastatic carcinoma to 1 of 3 lymph nodes, 0.5 cm; extracapsular extension present; margins uninvolved; Stage pT2, pN1a. She reports the procedure went very well with little pain, but she notes the drains, which were left in for 2 weeks, were awful. She states she is not planning on reconstruction.  She is currently undergoing radiation therapy under Dr. Lisbeth Renshaw. She reports her skin is purple and peeling, and notes using creams. She also notes fatigue.   REVIEW OF SYSTEMS: Zyairah reports she walks for exercise, including walking her dogs. Cancer-related symptoms as above. The patient denies unusual headaches, visual changes, nausea, vomiting, stiff neck, dizziness, or gait imbalance. There has been no cough, phlegm production, or pleurisy, no chest pain or pressure, and no change in bowel or bladder habits. The patient denies fever, rash, bleeding, unexplained fatigue or unexplained weight loss. A detailed review of systems was otherwise entirely negative.   PAST MEDICAL HISTORY: Past Medical History:  Diagnosis Date  . Breast cancer, right breast ([place]) 09/2017   S/P mastectomy 12/05/2017  . PONV (postoperative nausea and vomiting)     She denies a history of cataracts, glaucoma, emphysema, asthma, HTN, heart murmur or palpitations, GERD, stomach ulcers, issues with bowels or bladder.    PAST SURGICAL HISTORY: Past Surgical History:  Procedure Laterality Date  . BACK SURGERY    . BREAST BIOPSY Right 09/2017  . [place]; 1992  . DILATION AND CURETTAGE OF UTERUS    . [place] SURGERY  2003  . MASTECTOMY Left 12/05/2017   PROPHYLACTIC MASTECTOMY  .  MASTECTOMY COMPLETE / SIMPLE W/ SENTINEL NODE BIOPSY Right 12/05/2017   WITH RADIOACTIVE SEED TARGETED RIGHT AXIILARY LYMPH  NODE EXCISION AND RIGHT SENTINEL LYMPH NODE BIOPSY,   . MASTECTOMY WITH RADIOACTIVE SEED GUIDED EXCISION AND AXILLARY SENTINEL LYMPH NODE BIOPSY Bilateral 12/05/2017   Procedure: RIGHT SIMPLE MASTECTOMY WITH RADIOACTIVE SEED TARGETED RIGHT AXIILARY LYMPH NODE EXCISION AND RIGHT SENTINEL LYMPH NODE BIOPSY, LEFT PROPHYLACTIC MASTECTOMY;  Surgeon: [name], MD;  Location: [facility];  Service: General;  Laterality: Bilateral;    Ruptured vertebral disk surgery in 2003 under Dr. Ellene Route  FAMILY HISTORY Family History  Problem Relation Age of Onset  . Lung cancer Father     The patient's father died at age 34 due to lung cancer (heavy smoker). The patient's mother died at age 67 due to MI and Alzheimer's. The patient had no brothers or sisters. The patient's mother was 1/10 siblings, with a few other siblings having a history of Alzheimer's. There was a maternal aunt who may have had ovarian cancer, and the patient is going to verify this with family.   GYNECOLOGIC HISTORY:  No LMP recorded. Patient is postmenopausal. Menarche: 67 years old Age at first live birth: 67 years old She is GXP4.  LMP: about 25 years ago ( early 24's). She used hormone replacement for about 3-5 years. She also used oral contraception for 1 year with no complications.    SOCIAL HISTORY:  Sherryl is retired from working at [employer]. Her husband of 62 years, [name], is a Writer. He also used to be a paramedic. The patient and her husband share 1 daughter, Cloyde Reams age 98 who lives in [place], [place] as a Writer. The patient has 3 sons from a previous marriage. The oldest, A.C. Penn Marcello Moores age 28 is disabled with epilepsy. The second son B. Desmond Lope age 55, lives in [place], [place] in Press photographer. The third son, H. Vevelyn Royals age 49, lives in [place], [place] in sales.      ADVANCED DIRECTIVES: The patient's husband, [name] is her HCPOA   HEALTH MAINTENANCE: Social History   Tobacco Use   . Smoking status: Never Smoker  . Smokeless tobacco: Never Used  Substance Use Topics  . Alcohol use: Yes    Alcohol/week: 14.0 standard drinks    Types: 14 Glasses of wine per week  . Drug use: Never     Colonoscopy: 2013 in [place]  PAP: 2014  Bone density: 2014/ osteopenia   Allergies  Allergen Reactions  . Codeine Nausea And Vomiting and Other (See Comments)    headache    Current Outpatient Medications  Medication Sig Dispense Refill  . anastrozole (ARIMIDEX) 1 MG tablet Take 1 tablet (1 mg total) by mouth daily. 90 tablet 4  . calcium carbonate (TUMS - DOSED IN MG ELEMENTAL CALCIUM) 500 MG chewable tablet Chew 1 tablet by mouth daily.    Marland Kitchen ibuprofen (ADVIL,MOTRIN) 200 MG tablet Take 400 mg by mouth daily as needed for headache.    . naproxen sodium (ALEVE) 220 MG tablet Take 220 mg by mouth daily as needed (back pain).    . Nutritional Supplements (JUICE PLUS FIBRE PO) Juice plus fruits x 3 chews Juice plus veggie x 3 chews Juice plus Vine x 3 chews Juice plus Omega x 2 capsule    . oxyCODONE (OXY IR/ROXICODONE) 5 MG immediate release tablet Take 1 tablet (5 mg total) by mouth every 6 (six) hours as needed for severe pain. 10  tablet 0  . Prenatal Vit-Fe Fumarate-FA (PRENATAL PO) Take 1 tablet by mouth daily.    . Pseudoephedrine-Naproxen Na (ALEVE-D SINUS & HEADACHE PO) Take 1 tablet by mouth daily as needed (sinus headaches).     No current facility-administered medications for this visit.     OBJECTIVE: Middle-aged white woman no acute distress  Vitals:   02/27/18 1216  BP: 135/71  Pulse: 74  Resp: 18  Temp: 98.3 F (36.8 C)  SpO2: 100%     Body mass index is 25.88 kg/m.   Wt Readings from Last 3 Encounters:  02/27/18 143 lb 12.8 oz (65.2 kg)  12/19/17 142 lb 12.8 oz (64.8 kg)  11/28/17 145 lb 3.2 oz (65.9 kg)      ECOG FS:1 - Symptomatic but completely ambulatory  Sclerae unicteric, EOMs intact Oropharynx clear and moist No cervical or  supraclavicular adenopathy Lungs no rales or rhonchi Heart regular rate and rhythm Abd soft, nontender, positive bowel sounds MSK no focal spinal tenderness, no upper extremity lymphedema Neuro: nonfocal, well oriented, appropriate affect Breasts: Status post bilateral mastectomies.  The incisions are healing very nicely.  There is minimal redundance of tissue around the lateral edges.  On the right side of course she has significant erythema and mild desquamation in the axilla.    LAB RESULTS:  CMP     Component Value Date/Time   NA 139 12/06/2017 0551   K 4.5 12/06/2017 0551   CL 105 12/06/2017 0551   CO2 25 12/06/2017 0551   GLUCOSE 135 (H) 12/06/2017 0551   BUN 11 12/06/2017 0551   CREATININE 0.75 12/06/2017 0551   CREATININE 0.79 11/01/2017 0850   CALCIUM 8.6 (L) 12/06/2017 0551   PROT 5.6 (L) 12/06/2017 0551   ALBUMIN 3.4 (L) 12/06/2017 0551   AST 30 12/06/2017 0551   AST 21 11/01/2017 0850   ALT 21 12/06/2017 0551   ALT 20 11/01/2017 0850   ALKPHOS 44 12/06/2017 0551   BILITOT 0.8 12/06/2017 0551   BILITOT 0.5 11/01/2017 0850   GFRNONAA >60 12/06/2017 0551   GFRNONAA >60 11/01/2017 0850   GFRAA >60 12/06/2017 0551   GFRAA >60 11/01/2017 0850    No results found for: TOTALPROTELP, ALBUMINELP, A1GS, A2GS, BETS, BETA2SER, GAMS, MSPIKE, SPEI  No results found for: KPAFRELGTCHN, LAMBDASER, [facility]  Lab Results  Component Value Date   WBC 8.3 12/06/2017   NEUTROABS 2.0 11/28/2017   HGB 11.7 (L) 12/06/2017   HCT 36.6 12/06/2017   MCV 94.6 12/06/2017   PLT 224 12/06/2017    '@LASTCHEMISTRY' @  No results found for: LABCA2  No components found for: JXBJYN829  No results for input(s): INR in the last 168 hours.  No results found for: LABCA2  No results found for: FAO130  No results found for: QMV784  No results found for: ONG295  No results found for: CA2729  No components found for: HGQUANT  No results found for: CEA1 / No results found for:  CEA1   No results found for: AFPTUMOR  No results found for: CHROMOGRNA  No results found for: PSA1  No visits with results within 3 Day(s) from this visit.  Latest known visit with results is:  Admission on 12/05/2017, Discharged on 12/06/2017  Component Date Value Ref Range Status  . WBC 12/06/2017 8.3  4.0 - 10.5 K/uL Final  . RBC 12/06/2017 3.87  3.87 - 5.11 MIL/uL Final  . Hemoglobin 12/06/2017 11.7* 12.0 - 15.0 g/dL Final  . HCT 12/06/2017 36.6  36.0 -  46.0 % Final  . MCV 12/06/2017 94.6  78.0 - 100.0 fL Final  . MCH 12/06/2017 30.2  26.0 - 34.0 pg Final  . MCHC 12/06/2017 32.0  30.0 - 36.0 g/dL Final  . RDW 12/06/2017 12.4  11.5 - 15.5 % Final  . Platelets 12/06/2017 224  150 - 400 K/uL Final   Performed at [facility], [place] [address] [place], [place] [address]  . Sodium 12/06/2017 139  135 - 145 mmol/L Final  . Potassium 12/06/2017 4.5  3.5 - 5.1 mmol/L Final  . Chloride 12/06/2017 105  98 - 111 mmol/L Final  . CO2 12/06/2017 25  22 - 32 mmol/L Final  . Glucose, Bld 12/06/2017 135* 70 - 99 mg/dL Final  . BUN 12/06/2017 11  8 - 23 mg/dL Final  . Creatinine, Ser 12/06/2017 0.75  0.44 - 1.00 mg/dL Final  . Calcium 12/06/2017 8.6* 8.9 - 10.3 mg/dL Final  . Total Protein 12/06/2017 5.6* 6.5 - 8.1 g/dL Final  . Albumin 12/06/2017 3.4* 3.5 - 5.0 g/dL Final  . AST 12/06/2017 30  15 - 41 U/L Final  . ALT 12/06/2017 21  0 - 44 U/L Final  . Alkaline Phosphatase 12/06/2017 44  38 - 126 U/L Final  . Total Bilirubin 12/06/2017 0.8  0.3 - 1.2 mg/dL Final  . GFR calc non Af Amer 12/06/2017 >60  >60 mL/min Final  . GFR calc Af Amer 12/06/2017 >60  >60 mL/min Final   Comment: (NOTE) The eGFR has been calculated using the CKD EPI equation. This calculation has not been validated in all clinical situations. eGFR's persistently <60 mL/min signify possible Chronic Kidney Disease.   Georgiann Hahn gap 12/06/2017 9  5 - 15 Final   Performed at [facility], [place] [address],  [place], [place] [address]    (this displays the last labs from the last 3 days)  No results found for: TOTALPROTELP, ALBUMINELP, A1GS, A2GS, BETS, BETA2SER, GAMS, MSPIKE, SPEI (this displays SPEP labs)  No results found for: KPAFRELGTCHN, LAMBDASER, KAPLAMBRATIO (kappa/lambda light chains)  No results found for: HGBA, HGBA2QUANT, HGBFQUANT, HGBSQUAN (Hemoglobinopathy evaluation)   No results found for: LDH  No results found for: IRON, TIBC, IRONPCTSAT (Iron and TIBC)  No results found for: FERRITIN  Urinalysis No results found for: COLORURINE, APPEARANCEUR, LABSPEC, PHURINE, GLUCOSEU, HGBUR, BILIRUBINUR, KETONESUR, PROTEINUR, UROBILINOGEN, NITRITE, LEUKOCYTESUR   STUDIES: Pathology results reviewed with the patient as well as the Mammaprint results  ELIGIBLE FOR AVAILABLE RESEARCH PROTOCOL: no  ASSESSMENT: 67 y.o. [place], [place] woman that is post right breast lower outer quadrant biopsy 10/23/2017 for a clinical T2 N1, stage IIB invasive ductal carcinoma, grade 3, estrogen and progesterone receptor positive, HER-2 not amplified, with an MIB-1 of 20%  (1) Mammaprint on 11/07/2017 was read as low risk, predicting no significant chemotherapy benefit with a 5-year distant disease-free survival in the 97-98% range with hormone therapy alone  (2) status post bilateral mastectomies on 12/05/2017 showing  (a) on the left, no evidence of malignancy  (b) on the right, a pT2 pN1, stage IIB invasive ductal carcinoma, grade 3, with negative margins.  (3) adjuvant chemotherapy to be completed 03/01/2018.  (4) to start anastrozole 04/01/2018      PLAN: GEN did remarkably well with her surgery and is completing her radiation treatments.  She is now ready to consider systemic therapy.  We reviewed the Mammaprint results which are very favorable and suggest that she would get very little if any  benefit from chemotherapy.  She will however need at least 5 years of antiestrogens.  We  discussed the difference between tamoxifen and anastrozole in detail. She understands that anastrozole and the aromatase inhibitors in general work by blocking estrogen production. Accordingly vaginal dryness, decrease in bone density, and of course hot flashes can result. The aromatase inhibitors can also negatively affect the cholesterol profile, although that is a minor effect. One out of 5 women on aromatase inhibitors we will feel "old and achy". This arthralgia/myalgia syndrome, which resembles fibromyalgia clinically, does resolve with stopping the medications. Accordingly this is not a reason to not try an aromatase inhibitor but it is a frequent reason to stop it (in other words 20% of women will not be able to tolerate these medications).  Tamoxifen on the other hand does not block estrogen production. It does not "take away a woman's estrogen". It blocks the estrogen receptor in breast cells. Like anastrozole, it can also cause hot flashes. As opposed to anastrozole, tamoxifen has many estrogen-like effects. It is technically an estrogen receptor modulator. This means that in some tissues tamoxifen works like estrogen-- for example it helps strengthen the bones. It tends to improve the cholesterol profile. It can cause thickening of the endometrial lining, and even endometrial polyps or rarely cancer of the uterus.(The risk of uterine cancer due to tamoxifen is one additional cancer per thousand women year). It can cause vaginal wetness or stickiness. It can cause blood clots through this estrogen-like effect--the risk of blood clots with tamoxifen is exactly the same as with birth control pills or hormone replacement.  Neither of these agents causes mood changes or weight gain, despite the popular belief that they can have these side effects. We have data from studies comparing either of these drugs with placebo, and in those cases the control group had the same amount of weight gain and depression  as the group that took the drug.  After all this discussion what she would like to do is anastrozole.  She will start 04/01/2018 to give her little time to recover from radiation.  She will see me again in February and at that point assuming she is tolerating anastrozole well the plan will be to continue that a minimum of 5 years, and maximum of 7.  She will need a bone density sometime next year  She knows to call for any other issues that may develop before her next visit here.    Jaely Silman, [name], MD  02/27/18 12:42 PM Medical Oncology and Hematology [facility] [address] [place], [place] [address] Tel. [phone]    Fax. [phone]  Pat Kocher, am acting as scribe for Chauncey Cruel MD.

## 2018-02-28 ENCOUNTER — Ambulatory Visit: Payer: BLUE CROSS/BLUE SHIELD

## 2018-02-28 ENCOUNTER — Ambulatory Visit
Admission: RE | Admit: 2018-02-28 | Discharge: 2018-02-28 | Disposition: A | Discharge status: Home / Self Care | Payer: BLUE CROSS/BLUE SHIELD | Source: Ambulatory Visit | Attending: Radiation Oncology | Admitting: Radiation Oncology

## 2018-02-28 ENCOUNTER — Encounter: Payer: Self-pay | Admitting: Radiation Oncology

## 2018-02-28 DIAGNOSIS — Z510 Encounter for antineoplastic radiation therapy: Secondary | ICD-10-CM | POA: Diagnosis not present

## 2018-03-01 ENCOUNTER — Encounter: Payer: Self-pay | Admitting: Radiation Oncology

## 2018-03-01 ENCOUNTER — Ambulatory Visit: Payer: BLUE CROSS/BLUE SHIELD

## 2018-03-01 NOTE — Progress Notes (Signed)
  Radiation Oncology         [phone] ________________________________  Name: Sonia Sandoval MRN: [id number]  Date: 03/01/2018  DOB: [birth year]  End of Treatment Note  Diagnosis:   Right-sided breast cancer     Indication for treatment:  Curative       Radiation treatment dates:   01/15/2018 - 02/28/2018  Site/dose:   The patient initially received a dose of 50.4 Gy in 28 fractions to the chest wall and supraclavicular region. This was delivered using a 3-D conformal, 4 field technique. The patient then received a boost to the mastectomy scar. This delivered an additional 10 Gy in 5 fractions using an en face electron field. The total dose was 60.4 Gy.  Narrative: The patient tolerated radiation treatment relatively well. The patient had some expected skin irritation as she progressed during treatment. She experienced some skin peeling. Moist desquamation was not present at the end of treatment.  Plan: The patient has completed radiation treatment. The patient will return to radiation oncology clinic for routine followup in one month. I advised the patient to call or return sooner if they have any questions or concerns related to their recovery or treatment. ________________________________  Jodelle Gross, M.D., Ph.D.   This document serves as a record of services personally performed by [name], MD. It was created on his behalf by Wilburn Mylar, a trained medical scribe. The creation of this record is based on the scribe's personal observations and the provider's statements to them. This document has been checked and approved by the attending provider.

## 2018-03-02 ENCOUNTER — Other Ambulatory Visit: Payer: Self-pay | Admitting: Nurse Practitioner

## 2018-03-02 DIAGNOSIS — E2839 Other primary ovarian failure: Secondary | ICD-10-CM

## 2018-03-02 DIAGNOSIS — M8580 Other specified disorders of bone density and structure, unspecified site: Secondary | ICD-10-CM

## 2018-03-02 DIAGNOSIS — C50911 Malignant neoplasm of unspecified site of right female breast: Secondary | ICD-10-CM

## 2018-03-02 HISTORY — DX: Other specified disorders of bone density and structure, unspecified site: M85.80

## 2018-03-07 ENCOUNTER — Ambulatory Visit
Admission: RE | Admit: 2018-03-07 | Discharge: 2018-03-07 | Disposition: A | Discharge status: Home / Self Care | Payer: BLUE CROSS/BLUE SHIELD | Source: Ambulatory Visit | Attending: Nurse Practitioner | Admitting: Nurse Practitioner

## 2018-03-07 ENCOUNTER — Encounter: Payer: Self-pay | Admitting: *Deleted

## 2018-03-07 DIAGNOSIS — E2839 Other primary ovarian failure: Secondary | ICD-10-CM

## 2018-03-07 DIAGNOSIS — C50911 Malignant neoplasm of unspecified site of right female breast: Secondary | ICD-10-CM

## 2018-04-06 ENCOUNTER — Telehealth: Payer: Self-pay | Admitting: *Deleted

## 2018-04-06 NOTE — Telephone Encounter (Signed)
CALLED PATIENT TO RESCHEDULE FU APPT. ON 04-18-18 PER PATIENT REQUEST, RESCHEDULED TO 05-03-18 @ 3 PM, PATIENT AGREED TO NEW TIME AND DATE

## 2018-04-12 ENCOUNTER — Other Ambulatory Visit: Payer: Self-pay | Admitting: Oncology

## 2018-04-18 ENCOUNTER — Ambulatory Visit: Payer: Self-pay | Admitting: Radiation Oncology

## 2018-04-30 ENCOUNTER — Telehealth: Payer: Self-pay | Admitting: *Deleted

## 2018-04-30 NOTE — Telephone Encounter (Signed)
CALLED PATIENT TO INFORM OF FU APPT. WITH ALISON PERKINS ON 05-03-18 , SPOKE WITH PATIENT AND SHE IS AWARE OF THIS APPT.

## 2018-05-03 ENCOUNTER — Encounter: Payer: Self-pay | Admitting: Radiation Oncology

## 2018-05-03 ENCOUNTER — Ambulatory Visit
Admission: RE | Admit: 2018-05-03 | Discharge: 2018-05-03 | Disposition: A | Discharge status: Home / Self Care | Payer: BLUE CROSS/BLUE SHIELD | Source: Ambulatory Visit | Attending: Radiation Oncology | Admitting: Radiation Oncology

## 2018-05-03 ENCOUNTER — Other Ambulatory Visit: Payer: Self-pay

## 2018-05-03 VITALS — BP 127/75 | HR 74 | Temp 97.8°F | Resp 20 | Ht 63.0 in | Wt 144.6 lb

## 2018-05-03 DIAGNOSIS — C50511 Malignant neoplasm of lower-outer quadrant of right female breast: Secondary | ICD-10-CM

## 2018-05-03 DIAGNOSIS — Z170 Estrogen receptor positive status [ER+]: Principal | ICD-10-CM

## 2018-05-03 NOTE — Progress Notes (Signed)
Radiation Oncology         [phone] ________________________________  Name: Sonia Sandoval MRN: [id number]  Date of Service: 05/03/2018  DOB: [birth year]  Post Treatment Note  CC: [name], FNP  Magrinat, [name], MD  Diagnosis:   Stage IIA, PT2N1M0, grade 3 ER/PR positive, invasive ductal carcinoma of the right breast  Interval Since Last Radiation:  9 weeks   01/15/2018 - 02/28/2018: The patient initially received a dose of 50.4 Gy in 28 fractions to the chest wall and supraclavicular region. This was delivered using a 3-D conformal, 4 field technique. The patient then received a boost to the mastectomy scar. This delivered an additional 10 Gy in 5 fractions using an en face electron field. The total dose was 60.4 Gy.  Narrative:  The patient returns today for routine follow-up. During treatment she did very well with radiotherapy and did not have significant desquamation.                             On review of systems, the patient states she's doing very well and is not concerned about her skin. She reports she is still fatigued and has some numbness along her chest wall. She denies any pain however. No other complaints are verbalized.  ALLERGIES:  is allergic to codeine.  Meds: Current Outpatient Medications  Medication Sig Dispense Refill  . anastrozole (ARIMIDEX) 1 MG tablet Take 1 tablet (1 mg total) by mouth daily. 90 tablet 4  . calcium carbonate (TUMS - DOSED IN MG ELEMENTAL CALCIUM) 500 MG chewable tablet Chew 1 tablet by mouth daily.    Marland Kitchen ibuprofen (ADVIL,MOTRIN) 200 MG tablet Take 400 mg by mouth daily as needed for headache.    . naproxen sodium (ALEVE) 220 MG tablet Take 220 mg by mouth daily as needed (back pain).    . Nutritional Supplements (JUICE PLUS FIBRE PO) Juice plus fruits x 3 chews Juice plus veggie x 3 chews Juice plus Vine x 3 chews Juice plus Omega x 2 capsule    . oxyCODONE (OXY IR/ROXICODONE) 5 MG immediate release tablet Take 1 tablet (5  mg total) by mouth every 6 (six) hours as needed for severe pain. 10 tablet 0  . Prenatal Vit-Fe Fumarate-FA (PRENATAL PO) Take 1 tablet by mouth daily.    . Pseudoephedrine-Naproxen Na (ALEVE-D SINUS & HEADACHE PO) Take 1 tablet by mouth daily as needed (sinus headaches).     No current facility-administered medications for this encounter.     Physical Findings:  height is 5\' 3"  (1.6 m) and weight is 144 lb 9.6 oz (65.6 kg). Her oral temperature is 97.8 F (36.6 C). Her blood pressure is 127/75 and her pulse is 74. Her respiration is 20 and oxygen saturation is 100%.  Pain Assessment Pain Score: 0-No pain/10 In general this is a well appearing caucasian female in no acute distress. She's alert and oriented x4 and appropriate throughout the examination. Cardiopulmonary assessment is negative for acute distress and she exhibits normal effort. The right chest wall was examined and reveals no evidence of hyperpigmentation or desquamation.   Lab Findings: Lab Results  Component Value Date   WBC 8.3 12/06/2017   HGB 11.7 (L) 12/06/2017   HCT 36.6 12/06/2017   MCV 94.6 12/06/2017   PLT 224 12/06/2017     Radiographic Findings: No results found.  Impression/Plan: 1. Stage IIA, PT2N1M0, grade 3 ER/PR positive, invasive ductal carcinoma of the  right breast. The patient has been doing well since completion of radiotherapy. We discussed that we would be happy to continue to follow her as needed, but she will also continue to follow up with Dr. Jana Hakim in medical oncology. She was counseled on skin care as well as measures to avoid sun exposure to this area.  2. Survivorship. We discussed the importance of survivorship evaluation and she is not  currently scheduled for this, but will be in the near future. She was also given the monthly calendar for access to resources offered within the cancer center. 3. Neuropathy. The patient was encouraged to try OTC Vitamin B6 100mg  one to two times per day.  We discussed this is off label but could possibly improve some of her symptoms. She will try this and let us know if she changes her mind and wants to try neurontin.     Carola Rhine, PAC

## 2018-05-10 ENCOUNTER — Telehealth: Payer: Self-pay | Admitting: Oncology

## 2018-05-10 NOTE — Telephone Encounter (Signed)
R/s appt per 12/12 sch message - pt is aware of appt date and time

## 2018-06-20 ENCOUNTER — Other Ambulatory Visit: Payer: BLUE CROSS/BLUE SHIELD

## 2018-06-20 ENCOUNTER — Ambulatory Visit: Payer: BLUE CROSS/BLUE SHIELD | Admitting: Oncology

## 2018-06-22 ENCOUNTER — Other Ambulatory Visit: Payer: Self-pay

## 2018-06-22 DIAGNOSIS — Z170 Estrogen receptor positive status [ER+]: Principal | ICD-10-CM

## 2018-06-22 DIAGNOSIS — C50511 Malignant neoplasm of lower-outer quadrant of right female breast: Secondary | ICD-10-CM

## 2018-06-25 NOTE — Progress Notes (Signed)
[place]  [phone] [phone]    ID: Sonia Sandoval DOB: 12/28/1950  MR#: [id number]  AJO#:[id number]  Patient Care Team: [name], [place] as PCP - General (Nurse Practitioner) [name], MD as Consulting Physician (General Surgery) Magrinat, [name], MD as Consulting Physician (Oncology) [name], MD as Consulting Physician (Radiation Oncology) OTHER MD:   CHIEF COMPLAINT: Estrogen receptor positive breast cancer  CURRENT TREATMENT: anastrozole   HISTORY OF CURRENT ILLNESS: From the original intake note:  "Sonia Sandoval" felt that her right breast was noticeably smaller than the left. She brought this to her nurse practitioner's attention.  She had not had mammography in several years.  Sonia Sandoval was set up for bilateral diagnostic mammography with tomography and right breast ultrasonography at [facility] on 10/19/2017 showing: Breast density category B.  Highly suspicious mass in the right lower outer quadrant measuring 3.3 x 2.8 x 3.2 cm,  2 cm from the nipple with associated skin retraction. The right axilla showed a 9 mm lymph node with cortical thickening. There was no evidence of malignancy in the left breast.   Accordingly on 10/23/2017 she proceeded to biopsy of the right breast area in question. The pathology from this procedure showed ([id number]): Invasive ductal carcinoma grade III. The right axillary lymph node was also positive.  There was no lymph node tissue identified. Prognostic indicators significant for: estrogen receptor, 95% positive and progesterone receptor, 45% positive, both with strong staining intensity. Proliferation marker Ki67 at 20%. HER2  not amplified with ratios HER2/CEP17 signals 1.25 and average HER2 copies per cell 3.00  The patient's subsequent history is as detailed below.   INTERVAL HISTORY: Sonia Sandoval returns today for follow-up and treatment of her estrogen receptor positive breast cancer.   She  continues on anastrozole, which she started 04/01/2018. She has occasional mild hot flashes. She has no vaginal dryness issues. She is unsure what her cost will be.   Sonia Sandoval's last bone density screening on 03/07/2018, showed a T-score of -1.8, which is considered osteopenic.    Since her last visit here, she completed radiation therapy lasting from 01/15/2018 - 02/28/2018  Site/dose: The patient initially received a dose of 50.4 Gy in 28 fractions to the chest wall and supraclavicular region. This was delivered using a 3-D conformal, 4 field technique. The patient then received a boost to the mastectomy scar. This delivered an additional 10 Gy in 5 fractions using an en face electron field. The total dose was 60.4 Gy.    REVIEW OF SYSTEMS: Sonia Sandoval said she has lots of energy, but once she sits down, she could easily fall asleep in the evening. For exercise, she is walking and doing hand weights. She has noticed that under her right arm it seems like she has some fatty tissue that "sticks out." The patient denies unusual headaches, visual changes, nausea, vomiting, or dizziness. There has been no unusual cough, phlegm production, or pleurisy. This been no change in bowel or bladder habits. The patient denies unexplained fatigue or unexplained weight loss, bleeding, rash, or fever. A detailed review of systems was otherwise noncontributory.    PAST MEDICAL HISTORY: Past Medical History:  Diagnosis Date  . Breast cancer, right breast ([place]) 09/2017   S/P mastectomy 12/05/2017  . PONV (postoperative nausea and vomiting)     She denies a history of cataracts, glaucoma, emphysema, asthma, HTN, heart murmur or palpitations, GERD, stomach ulcers, issues with bowels or bladder.     PAST SURGICAL HISTORY: Past  Surgical History:  Procedure Laterality Date  . BACK SURGERY    . BREAST BIOPSY Right 09/2017  . [facility]; 1992  . DILATION AND CURETTAGE OF UTERUS    . [facility] SURGERY  2003  .  MASTECTOMY Left 12/05/2017   PROPHYLACTIC MASTECTOMY  . MASTECTOMY COMPLETE / SIMPLE W/ SENTINEL NODE BIOPSY Right 12/05/2017   WITH RADIOACTIVE SEED TARGETED RIGHT AXIILARY LYMPH NODE EXCISION AND RIGHT SENTINEL LYMPH NODE BIOPSY,   . MASTECTOMY WITH RADIOACTIVE SEED GUIDED EXCISION AND AXILLARY SENTINEL LYMPH NODE BIOPSY Bilateral 12/05/2017   Procedure: RIGHT SIMPLE MASTECTOMY WITH RADIOACTIVE SEED TARGETED RIGHT AXIILARY LYMPH NODE EXCISION AND RIGHT SENTINEL LYMPH NODE BIOPSY, LEFT PROPHYLACTIC MASTECTOMY;  Surgeon: [name], MD;  Location: [facility];  Service: General;  Laterality: Bilateral;    Ruptured vertebral disk surgery in 2003 under Dr. Ellene Route   FAMILY HISTORY: Family History  Problem Relation Age of Onset  . Lung cancer Father     The patient's father died at age 37 due to lung cancer (heavy smoker). The patient's mother died at age 45 due to MI and Alzheimer's. The patient had no brothers or sisters. The patient's mother was 1/10 siblings, with a few other siblings having a history of Alzheimer's. There was a maternal aunt who may have had ovarian cancer, and the patient is going to verify this with family.    GYNECOLOGIC HISTORY:  No LMP recorded. Patient is postmenopausal. Menarche: 68 years old Age at first live birth: 68 years old She is GXP4.  LMP: about 25 years ago ( early 25's). She used hormone replacement for about 3-5 years. She also used oral contraception for 1 year with no complications.    SOCIAL HISTORY:  Sonia Sandoval is retired from working at [employer]. Her husband of 36 years, [name], is a Writer. He also used to be a paramedic. The patient and her husband share 1 daughter, Cloyde Reams age 72 who lives in [place], [place] as a Writer. The patient has 3 sons from a previous marriage. The oldest, A.C. Penn Marcello Moores age 35 is disabled with epilepsy. The second son B. Desmond Lope age 104, lives in [place], [place] in Press photographer. The  third son, H. Vevelyn Royals age 40, lives in [place], [place] in sales.   ADVANCED DIRECTIVES: The patient's husband, [name] is her HCPOA   HEALTH MAINTENANCE: Social History   Tobacco Use  . Smoking status: Never Smoker  . Smokeless tobacco: Never Used  Substance Use Topics  . Alcohol use: Yes    Alcohol/week: 14.0 standard drinks    Types: 14 Glasses of wine per week  . Drug use: Never     Colonoscopy: 2013 in [place]  PAP: 2014  Bone density: 2014/ osteopenia   Allergies  Allergen Reactions  . Codeine Nausea And Vomiting and Other (See Comments)    headache    Current Outpatient Medications  Medication Sig Dispense Refill  . anastrozole (ARIMIDEX) 1 MG tablet Take 1 tablet (1 mg total) by mouth daily. 90 tablet 4  . calcium carbonate (TUMS - DOSED IN MG ELEMENTAL CALCIUM) 500 MG chewable tablet Chew 1 tablet by mouth daily.    Marland Kitchen ibuprofen (ADVIL,MOTRIN) 200 MG tablet Take 400 mg by mouth daily as needed for headache.    . naproxen sodium (ALEVE) 220 MG tablet Take 220 mg by mouth daily as needed (back pain).    . Nutritional Supplements (JUICE PLUS FIBRE PO) Juice plus fruits  x 3 chews Juice plus veggie x 3 chews Juice plus Vine x 3 chews Juice plus Omega x 2 capsule    . oxyCODONE (OXY IR/ROXICODONE) 5 MG immediate release tablet Take 1 tablet (5 mg total) by mouth every 6 (six) hours as needed for severe pain. 10 tablet 0  . Prenatal Vit-Fe Fumarate-FA (PRENATAL PO) Take 1 tablet by mouth daily.    . Pseudoephedrine-Naproxen Na (ALEVE-D SINUS & HEADACHE PO) Take 1 tablet by mouth daily as needed (sinus headaches).     No current facility-administered medications for this visit.     OBJECTIVE: Middle-aged white woman who appears well  Vitals:   06/26/18 1100  BP: 132/78  Pulse: 69  Resp: 18  Temp: 97.8 F (36.6 C)  SpO2: 98%     Body mass index is 25.54 kg/m.   Wt Readings from Last 3 Encounters:  06/26/18 144 lb 3.2 oz (65.4 kg)  05/03/18 144 lb 9.6  oz (65.6 kg)  02/27/18 143 lb 12.8 oz (65.2 kg)      ECOG FS:1 - Symptomatic but completely ambulatory  Sclerae unicteric, pupils round and equal No cervical or supraclavicular adenopathy Lungs no rales or rhonchi Heart regular rate and rhythm Abd soft, nontender, positive bowel sounds MSK no focal spinal tenderness, no upper extremity lymphedema Neuro: nonfocal, well oriented, appropriate affect Breasts: Status post bilateral mastectomies.  There is no evidence of chest wall recurrence.  She has recovered very nicely from the radiation.  She does have a slight fat tag or "dog ear" in the right upper lateral chest wall, which if she had it removed would probably significantly tether her right upper extremity.  Both axillae are benign.   LAB RESULTS:  CMP     Component Value Date/Time   NA 139 12/06/2017 0551   K 4.5 12/06/2017 0551   CL 105 12/06/2017 0551   CO2 25 12/06/2017 0551   GLUCOSE 135 (H) 12/06/2017 0551   BUN 11 12/06/2017 0551   CREATININE 0.75 12/06/2017 0551   CREATININE 0.79 11/01/2017 0850   CALCIUM 8.6 (L) 12/06/2017 0551   PROT 5.6 (L) 12/06/2017 0551   ALBUMIN 3.4 (L) 12/06/2017 0551   AST 30 12/06/2017 0551   AST 21 11/01/2017 0850   ALT 21 12/06/2017 0551   ALT 20 11/01/2017 0850   ALKPHOS 44 12/06/2017 0551   BILITOT 0.8 12/06/2017 0551   BILITOT 0.5 11/01/2017 0850   GFRNONAA >60 12/06/2017 0551   GFRNONAA >60 11/01/2017 0850   GFRAA >60 12/06/2017 0551   GFRAA >60 11/01/2017 0850    No results found for: TOTALPROTELP, ALBUMINELP, A1GS, A2GS, BETS, BETA2SER, GAMS, MSPIKE, SPEI  No results found for: Nils Pyle, [facility]  Lab Results  Component Value Date   WBC 4.2 06/26/2018   NEUTROABS 2.8 06/26/2018   HGB 12.3 06/26/2018   HCT 37.3 06/26/2018   MCV 92.8 06/26/2018   PLT 202 06/26/2018    '@LASTCHEMISTRY' @  No results found for: LABCA2  No components found for: QXIHWT888  No results for input(s): INR in the last  168 hours.  No results found for: LABCA2  No results found for: KCM034  No results found for: JZP915  No results found for: AVW979  No results found for: CA2729  No components found for: HGQUANT  No results found for: CEA1 / No results found for: CEA1   No results found for: AFPTUMOR  No results found for: [place]  No results found for: PSA1  Appointment on 06/26/2018  Component Date Value Ref Range Status  . WBC Count 06/26/2018 4.2  4.0 - 10.5 K/uL Final  . RBC 06/26/2018 4.02  3.87 - 5.11 MIL/uL Final  . Hemoglobin 06/26/2018 12.3  12.0 - 15.0 g/dL Final  . HCT 06/26/2018 37.3  36.0 - 46.0 % Final  . MCV 06/26/2018 92.8  80.0 - 100.0 fL Final  . MCH 06/26/2018 30.6  26.0 - 34.0 pg Final  . MCHC 06/26/2018 33.0  30.0 - 36.0 g/dL Final  . RDW 06/26/2018 12.4  11.5 - 15.5 % Final  . Platelet Count 06/26/2018 202  150 - 400 K/uL Final  . nRBC 06/26/2018 0.0  0.0 - 0.2 % Final  . Neutrophils Relative % 06/26/2018 65  % Final  . Neutro Abs 06/26/2018 2.8  1.7 - 7.7 K/uL Final  . Lymphocytes Relative 06/26/2018 23  % Final  . Lymphs Abs 06/26/2018 1.0  0.7 - 4.0 K/uL Final  . Monocytes Relative 06/26/2018 9  % Final  . Monocytes Absolute 06/26/2018 0.4  0.1 - 1.0 K/uL Final  . Eosinophils Relative 06/26/2018 2  % Final  . Eosinophils Absolute 06/26/2018 0.1  0.0 - 0.5 K/uL Final  . Basophils Relative 06/26/2018 1  % Final  . Basophils Absolute 06/26/2018 0.0  0.0 - 0.1 K/uL Final  . Immature Granulocytes 06/26/2018 0  % Final  . Abs Immature Granulocytes 06/26/2018 0.01  0.00 - 0.07 K/uL Final   Performed at [facility], [place] [address], [place], [place] [address]    (this displays the last labs from the last 3 days)  No results found for: TOTALPROTELP, ALBUMINELP, A1GS, A2GS, BETS, BETA2SER, GAMS, MSPIKE, SPEI (this displays SPEP labs)  No results found for: KPAFRELGTCHN, LAMBDASER, KAPLAMBRATIO (kappa/lambda light chains)  No results  found for: HGBA, HGBA2QUANT, HGBFQUANT, HGBSQUAN (Hemoglobinopathy evaluation)   No results found for: LDH  No results found for: IRON, TIBC, IRONPCTSAT (Iron and TIBC)  No results found for: FERRITIN  Urinalysis No results found for: COLORURINE, APPEARANCEUR, LABSPEC, PHURINE, GLUCOSEU, HGBUR, BILIRUBINUR, KETONESUR, PROTEINUR, UROBILINOGEN, NITRITE, LEUKOCYTESUR   STUDIES: No results found.   ELIGIBLE FOR AVAILABLE RESEARCH PROTOCOL: no   ASSESSMENT: 68 y.o. [place], [place] woman that is post right breast lower outer quadrant biopsy 10/23/2017 for a clinical T2 N1, stage IIB invasive ductal carcinoma, grade 3, estrogen and progesterone receptor positive, HER-2 not amplified, with an MIB-1 of 20%  (1) Mammaprint on 11/07/2017 was read as low risk, predicting no significant chemotherapy benefit with a 5-year distant disease-free survival in the 97-98% range with hormone therapy alone  (2) status post bilateral mastectomies on 12/05/2017 showing  (a) on the left, no evidence of malignancy  (b) on the right, a pT2 pN1, stage IIB invasive ductal carcinoma, grade 3, with negative margins.  (3) adjuvant chemotherapy 01/15/2018 - 02/28/2018  Site/dose: The patient initially received a dose of 50.4 Gy in 28 fractions to the chest wall and supraclavicular region. This was delivered using a 3-D conformal, 4 field technique. The patient then received a boost to the mastectomy scar. This delivered an additional 10 Gy in 5 fractions using an en face electron field. The total dose was 60.4 Gy.   (4) started anastrozole 04/01/2018  (a) bone density 03/07/2018 with a T score of -1.8   PLAN: Sonia Sandoval did remarkably well with her radiation and she is tolerating anastrozole with no significant side effects.  The plan will be to continue that a total of 5 years.  Her bone density shows osteopenia.  She is on vitamin D and calcium supplementation and has a good exercise program.  We will repeat a bone  density in a couple of years and if the T score drops below 2.0 we will consider a bisphosphonate or Prolia.  She will see my 41 assistant in survivorship in June.  She will then see me again in October and then I will see her 6 months after that in April 2021.  From that point I will start seeing her on a once a year basis  She knows to call for any other issues that may develop before the next visit.   Magrinat, [name], MD  06/26/18 11:19 AM Medical Oncology and Hematology [facility] [address] [place], [place] [address] Tel. [phone]    Fax. [phone]  I, Jacqualyn Posey am acting as a Education administrator for Chauncey Cruel, MD.   I, Lurline Del MD, have reviewed the above documentation for accuracy and completeness, and I agree with the above.

## 2018-06-26 ENCOUNTER — Inpatient Hospital Stay: Payer: BLUE CROSS/BLUE SHIELD | Attending: Oncology

## 2018-06-26 ENCOUNTER — Inpatient Hospital Stay (HOSPITAL_BASED_OUTPATIENT_CLINIC_OR_DEPARTMENT_OTHER): Payer: BLUE CROSS/BLUE SHIELD | Admitting: Oncology

## 2018-06-26 VITALS — BP 132/78 | HR 69 | Temp 97.8°F | Resp 18 | Ht 63.0 in | Wt 144.2 lb

## 2018-06-26 DIAGNOSIS — Z9221 Personal history of antineoplastic chemotherapy: Secondary | ICD-10-CM

## 2018-06-26 DIAGNOSIS — Z801 Family history of malignant neoplasm of trachea, bronchus and lung: Secondary | ICD-10-CM

## 2018-06-26 DIAGNOSIS — Z923 Personal history of irradiation: Secondary | ICD-10-CM | POA: Insufficient documentation

## 2018-06-26 DIAGNOSIS — Z170 Estrogen receptor positive status [ER+]: Secondary | ICD-10-CM

## 2018-06-26 DIAGNOSIS — Z791 Long term (current) use of non-steroidal anti-inflammatories (NSAID): Secondary | ICD-10-CM | POA: Diagnosis not present

## 2018-06-26 DIAGNOSIS — Z79811 Long term (current) use of aromatase inhibitors: Secondary | ICD-10-CM | POA: Insufficient documentation

## 2018-06-26 DIAGNOSIS — R232 Flushing: Secondary | ICD-10-CM | POA: Insufficient documentation

## 2018-06-26 DIAGNOSIS — M8580 Other specified disorders of bone density and structure, unspecified site: Secondary | ICD-10-CM

## 2018-06-26 DIAGNOSIS — C50511 Malignant neoplasm of lower-outer quadrant of right female breast: Secondary | ICD-10-CM

## 2018-06-26 LAB — CMP (CANCER CENTER ONLY)
ALBUMIN: 4.2 g/dL (ref 3.5–5.0)
ALK PHOS: 54 U/L (ref 38–126)
ALT: 29 U/L (ref 0–44)
ANION GAP: 9 (ref 5–15)
AST: 32 U/L (ref 15–41)
BUN: 21 mg/dL (ref 8–23)
CALCIUM: 9.3 mg/dL (ref 8.9–10.3)
CHLORIDE: 105 mmol/L (ref 98–111)
CO2: 25 mmol/L (ref 22–32)
Creatinine: 0.77 mg/dL (ref 0.44–1.00)
GFR, Estimated: >60 mL/min (ref >60)
GLUCOSE: 100 mg/dL — AB (ref 70–99)
Potassium: 4.1 mmol/L (ref 3.5–5.1)
SODIUM: 139 mmol/L (ref 135–145)
Total Bilirubin: 0.5 mg/dL (ref 0.3–1.2)
Total Protein: 6.7 g/dL (ref 6.5–8.1)

## 2018-06-26 LAB — CBC WITH DIFFERENTIAL (CANCER CENTER ONLY)
ABS IMMATURE GRANULOCYTES: 0.01 10*3/uL (ref 0.00–0.07)
BASOS ABS: 0 10*3/uL (ref 0.0–0.1)
BASOS PCT: 1 %
Eosinophils Absolute: 0.1 10*3/uL (ref 0.0–0.5)
Eosinophils Relative: 2 %
HCT: 37.3 % (ref 36.0–46.0)
HEMOGLOBIN: 12.3 g/dL (ref 12.0–15.0)
Immature Granulocytes: 0 %
Lymphocytes Relative: 23 %
Lymphs Abs: 1 10*3/uL (ref 0.7–4.0)
MCH: 30.6 pg (ref 26.0–34.0)
MCHC: 33 g/dL (ref 30.0–36.0)
MCV: 92.8 fL (ref 80.0–100.0)
MONO ABS: 0.4 10*3/uL (ref 0.1–1.0)
MONOS PCT: 9 %
NEUTROS ABS: 2.8 10*3/uL (ref 1.7–7.7)
Neutrophils Relative %: 65 %
PLATELETS: 202 10*3/uL (ref 150–400)
RBC: 4.02 MIL/uL (ref 3.87–5.11)
RDW: 12.4 % (ref 11.5–15.5)
WBC Count: 4.2 10*3/uL (ref 4.0–10.5)
nRBC: 0 % (ref 0.0–0.2)

## 2018-10-05 ENCOUNTER — Other Ambulatory Visit: Payer: Self-pay | Admitting: Adult Health

## 2018-10-15 ENCOUNTER — Telehealth: Payer: Self-pay | Admitting: Adult Health

## 2018-10-15 NOTE — Telephone Encounter (Signed)
Called patient regarding upcoming [facility] appointment, per patient's request this appointment has been cancelled. Patient will call back when ready to reschedule.  Message to provider.

## 2018-10-18 ENCOUNTER — Encounter: Payer: BC Managed Care – PPO | Admitting: Adult Health

## 2018-11-08 ENCOUNTER — Telehealth: Payer: Self-pay

## 2018-11-08 ENCOUNTER — Telehealth: Payer: Self-pay | Admitting: *Deleted

## 2018-11-08 NOTE — Telephone Encounter (Signed)
No answer

## 2018-11-08 NOTE — Telephone Encounter (Signed)
[name]  11/08/18 4:49 PM  This RN called patient Sonia Sandoval regarding the [place] trial.  Inquired if patient is available to come in at an earlier time tomorrow, patient is able to come in at noon.  Research visit scheduled.  Explained that Dr. Jana Hakim is out of the office, but that one of his partners would be available to answer any questions she may have trial participation.  Confirmed email address with patient; informed consent form has been emailed to patient for review.   Patient has contacted her insurance provider regarding participation, and insurance provider requested CPT codes that would need to be covered.  This RN explained that visits and procedures that take place strictly for the purpose of the research study are provided by the research study.  However, routine costs (such as annual mammogram, AI medication, office visits for side effect evaluation, etc.) would go to her insurance.  Because there are a variety of CPT codes, I am unable to provide all of the possibilities.  Patient acknowledged and verbalized an understanding.  The patient was thanked for her interest in trial participation. [name], RN, BSN, [facility] 11/08/2018 4:54 PM

## 2018-11-08 NOTE — Telephone Encounter (Signed)
[name]  11/08/18 11:28 AM  This RN called patient Sonia Sandoval to introduce the [facility] trial to her, per Dr. Jana Hakim.  We briefly discussed the purpose of the trial, randomization and potential for assignment to either observation or treatment with investigational medication ribociclib.  Discussed frequency of study visits and expected procedures of study visits; discussed the screening phase of the study and review of eligibility criteria to ensure patient meets criteria for study participation.  Reviewed common side effects of ribociclib, and discussed the importance of patient reviewing the consent form in full to make an informed decision regarding participation.  Explained to patient that the group of patients with stage II breast cancer is nearing closure, so tomorrow would be the last day to enter the screening phase of the trial, if she is interested.  Again emphasized importance of patient reviewing the consent form to make an informed decision.   Discussed that participation is voluntary and the patient verbalized an understanding of this.  Patient expressed an interest in learning more and potentially participating in the trial. Patient asked what impact participation would have on her insurance coverage.  This RN encouraged the patient to contact her insurance company directly, as Hydrologist is different.  Informed the patient that if the insurance company asks for information about the trial, that we can provide her with that information to provide to her insurance provider.  Reminded patient that COVID-19 precautions are in place, but patient would be attending additional visits to the [facility] outside of her usual care that are voluntary.  Patient verbalizes understanding and expressed a willingness to coming in during the COVID-19 pandemic.  Patient plans to come in tomorrow at 1:00pm to review the consent form and potentially initiate screening procedures if she decides to  participate in the trial. Screening visit procedures that cannot be completed on 11/09/18 will be scheduled to be completed during the screening portion of the trial.  This RN thanked the patient for her time.  [name], RN, BSN, [facility] 11/08/2018 12:04 PM

## 2018-11-09 ENCOUNTER — Other Ambulatory Visit: Payer: Self-pay | Admitting: *Deleted

## 2018-11-09 ENCOUNTER — Inpatient Hospital Stay: Payer: BC Managed Care – PPO | Attending: Oncology | Admitting: *Deleted

## 2018-11-09 ENCOUNTER — Other Ambulatory Visit: Payer: Self-pay

## 2018-11-09 DIAGNOSIS — Z006 Encounter for examination for normal comparison and control in clinical research program: Secondary | ICD-10-CM | POA: Insufficient documentation

## 2018-11-09 DIAGNOSIS — Z170 Estrogen receptor positive status [ER+]: Secondary | ICD-10-CM | POA: Insufficient documentation

## 2018-11-09 DIAGNOSIS — C50511 Malignant neoplasm of lower-outer quadrant of right female breast: Secondary | ICD-10-CM

## 2018-11-09 DIAGNOSIS — Z9013 Acquired absence of bilateral breasts and nipples: Secondary | ICD-10-CM | POA: Insufficient documentation

## 2018-11-09 DIAGNOSIS — Z79811 Long term (current) use of aromatase inhibitors: Secondary | ICD-10-CM | POA: Insufficient documentation

## 2018-11-09 DIAGNOSIS — Z9221 Personal history of antineoplastic chemotherapy: Secondary | ICD-10-CM | POA: Insufficient documentation

## 2018-11-09 NOTE — Research (Signed)
[id number] ([id number]): A Phase III, multicenter, randomized, open-label trialto evaluate efficacy and safety of ribocliclib with endrocrine therapy as an adjuvant treatment in patients with hormone receptor-positive, HER2-negative, early breast cancer (New Adjuvant TriAl with Robciclib [LEE011]: [name])  [name] Consent: 11/09/18 11:00am  This RN met with patient Sonia Sandoval, a post menopausal female who is interested in participation in the above listed [name] trial.  This patient and RN have also discussed the trial over the phone (see prior documentation), and patient confirmed she had received the consent form via email and she had reviewed prior to meeting today. The informed consent form was reviewed page by page, reviewing the study purpose, screening and randomization process, two treatment arms of the trial, expected procedures and frequency of visits, investigational treatment, potential risks and benefits of trial participation as outlined in the informed consent form.  Emphasized that participation in a clinical trial is voluntary and patient may also withdraw consent at any time, and that patient will not lose any rights by participating in a clinical trial.  Patient verbalizes an understanding.  Discussed participation in optional portions of clinical trial, and potential risks and benefits as outlined in the consent form.  Patient is agreeable to participation in both the pharmacogenetics portion and personal data/biological samples portion.  Patient would like for optional blood specimen to be collected at the same time as other blood specimen and this RN explained that the study team would attempt to do that. Discussed the minimum of two day requirement of study visits, due to time it takes to get all required lab results back.  Also discussed the time commitment of visits throughout participation and the patient expressed understanding and is agreeable to both of these commitments.   Patient confirmed during consent conversation that she is postmenopausal with her LMP many years ago, and that she does not often eat grapefruit or related products that are outlined in the consent form.   Dr. Lindi Adie also met with patient during her Research Encounter today to discuss the trial and participate in the consent conversation. All questions were answered to the patient's satisfaction.  The patient verbalized an understanding of the trial and voluntarily signed the informed consent form, version 3.0 dated 23[name]2020, IRB approved 06Apr2020, at 11:50am 11/09/18.  No trial procedures were initiated or completed prior to the signing of this informed consent form.  Patient was provided with a signed copy of this consent form, along with this RN's card with contact information, and [place] clinical trials brochure.  Following the signing of the informed consent form, the patient voluntarily completed a Release of Information Form to be used as needed throughout the course of her trial participation.  Following signing of the informed consent form the patient entered the screening phase of the trial.  Patient was registered to screening in the IRT.  Patient was scheduled for remaining screening procedures to be completed in the next week, and within protocol specified window. The patient was thanked for her interest and willingness to participate in the [place] trial. [name], RN, BSN, [facility] 11/09/2018 1:41 PM

## 2018-11-12 ENCOUNTER — Encounter: Payer: Self-pay | Admitting: *Deleted

## 2018-11-13 NOTE — Progress Notes (Signed)
[facility]  [phone] [phone]    ID: Sonia Sandoval DOB: 10-20-1950  MR#: [id number]  PXT#:[id number]  Patient Care Team: [name], [place] as PCP - General (Nurse Practitioner) [name], MD as Consulting Physician (General Surgery) Alanny Rivers, [name], MD as Consulting Physician (Oncology) [name], MD as Consulting Physician (Radiation Oncology) OTHER MD:   CHIEF COMPLAINT: Estrogen receptor positive breast cancer  CURRENT TREATMENT: anastrozole; ribociclib vs placebo ([name] trial)   INTERVAL HISTORY: Sonia Sandoval returns today for follow-up and treatment of her estrogen receptor positive breast cancer.   She continues on anastrozole with good tolerance. She reports instead of slightly sweating when out in the heat, she becomes drenched when she does sweat.  Indoors she has minimal hot flashes and denies vaginal dryness.  Sonia Sandoval's last bone density screening on 03/07/2018, showed a T-score of -1.8, which is considered osteopenic.  She walks 10,000 steps a day, which will help in that regard.  Since her last visit here, she has not undergone any additional studies.  Recall she is status post bilateral mastectomies  REVIEW OF SYSTEMS: Sonia Sandoval reports she has 2 acres of land that she likes to work in. So this is how she gets most of her exercise. She does also walk her two dogs.She reports some constipation, with 2 or 3 bowel movements a week, not particularly hard. A detailed review of systems was otherwise noncontributory.     HISTORY OF CURRENT ILLNESS: From the original intake note:  "Sonia Sandoval" felt that her right breast was noticeably smaller than the left. She brought this to her nurse practitioner's attention.  She had not had mammography in several years.  Sonia Sandoval was set up for bilateral diagnostic mammography with tomography and right breast ultrasonography at [facility] on 10/19/2017 showing: Breast density category B.  Highly  suspicious mass in the right lower outer quadrant measuring 3.3 x 2.8 x 3.2 cm,  2 cm from the nipple with associated skin retraction. The right axilla showed a 9 mm lymph node with cortical thickening. There was no evidence of malignancy in the left breast.   Accordingly on 10/23/2017 she proceeded to biopsy of the right breast area in question. The pathology from this procedure showed ([id number]): Invasive ductal carcinoma grade III. The right axillary lymph node was also positive.  There was no lymph node tissue identified. Prognostic indicators significant for: estrogen receptor, 95% positive and progesterone receptor, 45% positive, both with strong staining intensity. Proliferation marker Ki67 at 20%. HER2  not amplified with ratios HER2/CEP17 signals 1.25 and average HER2 copies per cell 3.00  The patient's subsequent history is as detailed below.   PAST MEDICAL HISTORY: Past Medical History:  Diagnosis Date  . Breast cancer, right breast ([place]) 09/2017   S/P mastectomy 12/05/2017  . PONV (postoperative nausea and vomiting)    She denies a history of cataracts, glaucoma, emphysema, asthma, HTN, heart murmur or palpitations, GERD, stomach ulcers, issues with bowels or bladder.     PAST SURGICAL HISTORY: Past Surgical History:  Procedure Laterality Date  . BACK SURGERY    . BREAST BIOPSY Right 09/2017  . [place]; 1992  . DILATION AND CURETTAGE OF UTERUS    . [place] SURGERY  2003  . MASTECTOMY Left 12/05/2017   PROPHYLACTIC MASTECTOMY  . MASTECTOMY COMPLETE / SIMPLE W/ SENTINEL NODE BIOPSY Right 12/05/2017   WITH RADIOACTIVE SEED TARGETED RIGHT AXIILARY LYMPH NODE EXCISION AND RIGHT SENTINEL LYMPH NODE BIOPSY,   .  MASTECTOMY WITH RADIOACTIVE SEED GUIDED EXCISION AND AXILLARY SENTINEL LYMPH NODE BIOPSY Bilateral 12/05/2017   Procedure: RIGHT SIMPLE MASTECTOMY WITH RADIOACTIVE SEED TARGETED RIGHT AXIILARY LYMPH NODE EXCISION AND RIGHT SENTINEL LYMPH NODE BIOPSY, LEFT  PROPHYLACTIC MASTECTOMY;  Surgeon: [name], MD;  Location: [facility];  Service: General;  Laterality: Bilateral;   Ruptured vertebral disk surgery in 2003 under Dr. Ellene Route   FAMILY HISTORY: Family History  Problem Relation Age of Onset  . Lung cancer Father     The patient's father died at age 52 due to lung cancer (heavy smoker). The patient's mother died at age 61 due to MI and Alzheimer's. The patient had no brothers or sisters. The patient's mother was 1/10 siblings, with a few other siblings having a history of Alzheimer's. There was a maternal aunt who may have had ovarian cancer, and the patient is going to verify this with family.    GYNECOLOGIC HISTORY:  No LMP recorded. Patient is postmenopausal. Menarche: 68 years old Age at first live birth: 68 years old She is GXP4.  LMP: about 25 years ago ( early 81's). She used hormone replacement for about 3-5 years. She also used oral contraception for 1 year with no complications.    SOCIAL HISTORY:  Sonia Sandoval is retired from working at [employer]. Her husband of 2 years, [name], is a Writer. He also used to be a paramedic. The patient and her husband share 1 daughter, Cloyde Reams age 51 who lives in [place], [place] as a Writer. The patient has 3 sons from a previous marriage. The oldest, A.C. Penn Marcello Moores age 28 is disabled with epilepsy. The second son B. Desmond Lope age 72, lives in [place], [place] in Press photographer. The third son, H. Vevelyn Royals age 36, lives in [place], [place] in sales.   ADVANCED DIRECTIVES: The patient's husband, [name] is her HCPOA   HEALTH MAINTENANCE: Social History   Tobacco Use  . Smoking status: Never Smoker  . Smokeless tobacco: Never Used  Substance Use Topics  . Alcohol use: Yes    Alcohol/week: 14.0 standard drinks    Types: 14 Glasses of wine per week  . Drug use: Never     Colonoscopy: 2013 in [place]  PAP: 2014  Bone density: 2014/ osteopenia    Allergies  Allergen Reactions  . Codeine Nausea And Vomiting and Other (See Comments)    headache    Current Outpatient Medications  Medication Sig Dispense Refill  . anastrozole (ARIMIDEX) 1 MG tablet Take 1 tablet (1 mg total) by mouth daily. 90 tablet 4  . calcium carbonate (TUMS - DOSED IN MG ELEMENTAL CALCIUM) 500 MG chewable tablet Chew 1 tablet by mouth daily.    Marland Kitchen ibuprofen (ADVIL,MOTRIN) 200 MG tablet Take 400 mg by mouth daily as needed for headache.    . naproxen sodium (ALEVE) 220 MG tablet Take 220 mg by mouth daily as needed (back pain).    . Nutritional Supplements (JUICE PLUS FIBRE PO) Juice plus fruits x 3 chews Juice plus veggie x 3 chews Juice plus Vine x 3 chews Juice plus Omega x 2 capsule    . oxyCODONE (OXY IR/ROXICODONE) 5 MG immediate release tablet Take 1 tablet (5 mg total) by mouth every 6 (six) hours as needed for severe pain. 10 tablet 0  . Prenatal Vit-Fe Fumarate-FA (PRENATAL PO) Take 1 tablet by mouth daily.    . Pseudoephedrine-Naproxen Na (ALEVE-D SINUS & HEADACHE PO) Take 1 tablet by mouth daily  as needed (sinus headaches).     No current facility-administered medications for this visit.     OBJECTIVE: Middle-aged white woman in no acute distress  Vitals:   11/14/18 0956  BP: (!) 142/76  Pulse: 70  Resp: 16  Temp: 97.8 F (36.6 C)     Body mass index is 25.54 kg/m.   Wt Readings from Last 3 Encounters:  11/14/18 144 lb 3.2 oz (65.4 kg)  06/26/18 144 lb 3.2 oz (65.4 kg)  05/03/18 144 lb 9.6 oz (65.6 kg)      ECOG FS:0 - Asymptomatic  Sclerae unicteric, EOMs intact Wearing a mask No cervical or supraclavicular adenopathy Lungs no rales or rhonchi Heart regular rate and rhythm Abd soft, nontender, positive bowel sounds MSK no focal spinal tenderness, no upper extremity lymphedema Neuro: nonfocal, well oriented, appropriate affect Breasts: Status post bilateral mastectomies.  There is no evidence of chest wall recurrence.  Both  axillae are benign.  LAB RESULTS:  CMP     Component Value Date/Time   NA 140 11/14/2018 0920   K 3.9 11/14/2018 0920   CL 106 11/14/2018 0920   CO2 25 11/14/2018 0920   GLUCOSE 99 11/14/2018 0920   BUN 16 11/14/2018 0920   CREATININE 0.79 11/14/2018 0920   CALCIUM 8.9 11/14/2018 0920   PROT 7.0 11/14/2018 0920   ALBUMIN 4.3 11/14/2018 0920   AST 27 11/14/2018 0920   ALT 26 11/14/2018 0920   ALKPHOS 64 11/14/2018 0920   BILITOT 0.6 11/14/2018 0920   GFRNONAA >60 11/14/2018 0920   GFRAA >60 11/14/2018 0920    No results found for: TOTALPROTELP, ALBUMINELP, A1GS, A2GS, BETS, BETA2SER, GAMS, MSPIKE, SPEI  No results found for: KPAFRELGTCHN, LAMBDASER, KAPLAMBRATIO  Lab Results  Component Value Date   WBC 2.9 (L) 11/14/2018   NEUTROABS 1.7 11/14/2018   HGB 13.1 11/14/2018   HCT 39.9 11/14/2018   MCV 93.9 11/14/2018   PLT 228 11/14/2018    '@LASTCHEMISTRY' @  No results found for: LABCA2  No components found for: YWVPXT062  No results for input(s): INR in the last 168 hours.  No results found for: LABCA2  No results found for: IRS854  No results found for: OEV035  No results found for: KKX381  No results found for: CA2729  No components found for: HGQUANT  No results found for: CEA1 / No results found for: CEA1   No results found for: AFPTUMOR  No results found for: CHROMOGRNA  No results found for: PSA1  Appointment on 11/14/2018  Component Date Value Ref Range Status  . WBC Count 11/14/2018 2.9* 4.0 - 10.5 K/uL Final  . RBC 11/14/2018 4.25  3.87 - 5.11 MIL/uL Final  . Hemoglobin 11/14/2018 13.1  12.0 - 15.0 g/dL Final  . HCT 11/14/2018 39.9  36.0 - 46.0 % Final  . MCV 11/14/2018 93.9  80.0 - 100.0 fL Final  . MCH 11/14/2018 30.8  26.0 - 34.0 pg Final  . MCHC 11/14/2018 32.8  30.0 - 36.0 g/dL Final  . RDW 11/14/2018 12.5  11.5 - 15.5 % Final  . Platelet Count 11/14/2018 228  150 - 400 K/uL Final  . nRBC 11/14/2018 0.0  0.0 - 0.2 % Final  .  Neutrophils Relative % 11/14/2018 59  % Final  . Neutro Abs 11/14/2018 1.7  1.7 - 7.7 K/uL Final  . Lymphocytes Relative 11/14/2018 28  % Final  . Lymphs Abs 11/14/2018 0.8  0.7 - 4.0 K/uL Final  . Monocytes Relative 11/14/2018 8  %  Final  . Monocytes Absolute 11/14/2018 0.2  0.1 - 1.0 K/uL Final  . Eosinophils Relative 11/14/2018 4  % Final  . Eosinophils Absolute 11/14/2018 0.1  0.0 - 0.5 K/uL Final  . Basophils Relative 11/14/2018 1  % Final  . Basophils Absolute 11/14/2018 0.0  0.0 - 0.1 K/uL Final  . Immature Granulocytes 11/14/2018 0  % Final  . Abs Immature Granulocytes 11/14/2018 0.00  0.00 - 0.07 K/uL Final   Performed at [facility], [place] [address], [place], [place] [address]  . Sodium 11/14/2018 140  135 - 145 mmol/L Final  . Potassium 11/14/2018 3.9  3.5 - 5.1 mmol/L Final  . Chloride 11/14/2018 106  98 - 111 mmol/L Final  . CO2 11/14/2018 25  22 - 32 mmol/L Final  . Glucose, Bld 11/14/2018 99  70 - 99 mg/dL Final  . BUN 11/14/2018 16  8 - 23 mg/dL Final  . Creatinine 11/14/2018 0.79  0.44 - 1.00 mg/dL Final  . Calcium 11/14/2018 8.9  8.9 - 10.3 mg/dL Final  . Total Protein 11/14/2018 7.0  6.5 - 8.1 g/dL Final  . Albumin 11/14/2018 4.3  3.5 - 5.0 g/dL Final  . AST 11/14/2018 27  15 - 41 U/L Final  . ALT 11/14/2018 26  0 - 44 U/L Final  . Alkaline Phosphatase 11/14/2018 64  38 - 126 U/L Final  . Total Bilirubin 11/14/2018 0.6  0.3 - 1.2 mg/dL Final  . GFR, Est Non Af Am 11/14/2018 >60  >60 mL/min Final  . GFR, Est AFR Am 11/14/2018 >60  >60 mL/min Final  . Anion gap 11/14/2018 9  5 - 15 Final   Performed at [facility], [place] [address] [place], [place] [address]  . Uric Acid, Serum 11/14/2018 3.4  2.5 - 7.1 mg/dL Final   Performed at [facility], [place] [address] [place], [place] [address]  . Magnesium 11/14/2018 2.1  1.7 - 2.4 mg/dL Final   Performed at [facility], [place] [address] [place], [place] [address]  . LDH 11/14/2018 206* 98 - 192 U/L Final   Performed at [facility], [place] [address], [place],  [address]    (this displays the last labs from the last 3 days)  No results found for: TOTALPROTELP, ALBUMINELP, A1GS, A2GS, BETS, BETA2SER, GAMS, MSPIKE, SPEI (this displays SPEP labs)  No results found for: KPAFRELGTCHN, LAMBDASER, KAPLAMBRATIO (kappa/lambda light chains)  No results found for: HGBA, HGBA2QUANT, HGBFQUANT, HGBSQUAN (Hemoglobinopathy evaluation)   Lab Results  Component Value Date   LDH 206 (H) 11/14/2018    No results found for: IRON, TIBC, IRONPCTSAT (Iron and TIBC)  No results found for: FERRITIN  Urinalysis No results found for: COLORURINE, APPEARANCEUR, LABSPEC, PHURINE, GLUCOSEU, HGBUR, BILIRUBINUR, KETONESUR, PROTEINUR, UROBILINOGEN, NITRITE, LEUKOCYTESUR   STUDIES: No results found.   ELIGIBLE FOR AVAILABLE RESEARCH PROTOCOL: [name]   ASSESSMENT: 68 y.o. [place], [place] woman that is post right breast lower outer quadrant biopsy 10/23/2017 for a clinical T2 N1, stage IIB invasive ductal carcinoma, grade 3, estrogen and progesterone receptor positive, HER-2 not amplified, with an MIB-1 of 20%  (1) Mammaprint on 11/07/2017 was read as low risk, predicting no significant chemotherapy benefit with a 5-year distant disease-free survival in the 97-98% range with hormone therapy alone  (2) status post bilateral mastectomies on 12/05/2017 showing  (a) on the left, no evidence of malignancy  (b) on the right, a  pT2 pN1, stage IIB invasive ductal carcinoma, grade 3, with negative margins.  (3) adjuvant chemotherapy 01/15/2018 - 02/28/2018  Site/dose: The patient initially received a dose of 50.4 Gy in 28 fractions to the chest wall and supraclavicular region. This was delivered using a 3-D conformal, 4 field technique. The patient then received a boost to the mastectomy scar. This delivered  an additional 10 Gy in 5 fractions using an en face electron field. The total dose was 60.4 Gy.   (4) started anastrozole 04/01/2018  (a) bone density 03/07/2018 with a T score of -1.8  (5) enrolled in [place] trial July 2020, randomized to   PLAN: Sonia Sandoval is now just about a year out from definitive surgery for her breast cancer with no evidence of disease recurrence.  This is favorable.  She is tolerating anastrozole remarkably well and the plan will be to continue that a minimum of 5 years.  She was enrolled in the [place] trial today.  Randomization should be within the next 2 weeks.  She is going to see [place] again in a month as part of the trial pattern of follow-up  She is keeping appropriate pandemic precautions.  I am delighted at her walking program.  She will have a repeat bone density November of next year  She knows to call for any other issue that may develop before the next visit. Sonia Sandoval, [name], MD  11/14/18 10:37 AM Medical Oncology and Hematology [facility] [address] [place], [place] [address] Tel. [phone]    Fax. [phone]   I, Wilburn Mylar, am acting as scribe for Dr. [name]. Sonia Sandoval.  I, Lurline Del MD, have reviewed the above documentation for accuracy and completeness, and I agree with the above.

## 2018-11-14 ENCOUNTER — Inpatient Hospital Stay: Payer: BC Managed Care – PPO | Admitting: *Deleted

## 2018-11-14 ENCOUNTER — Inpatient Hospital Stay (HOSPITAL_BASED_OUTPATIENT_CLINIC_OR_DEPARTMENT_OTHER): Payer: BC Managed Care – PPO | Admitting: Oncology

## 2018-11-14 ENCOUNTER — Encounter: Payer: Self-pay | Admitting: *Deleted

## 2018-11-14 ENCOUNTER — Other Ambulatory Visit: Payer: Self-pay

## 2018-11-14 ENCOUNTER — Inpatient Hospital Stay: Payer: BC Managed Care – PPO

## 2018-11-14 VITALS — BP 142/76 | HR 70 | Temp 97.8°F | Resp 16 | Ht 63.0 in | Wt 144.2 lb

## 2018-11-14 DIAGNOSIS — Z170 Estrogen receptor positive status [ER+]: Secondary | ICD-10-CM

## 2018-11-14 DIAGNOSIS — C50511 Malignant neoplasm of lower-outer quadrant of right female breast: Secondary | ICD-10-CM

## 2018-11-14 DIAGNOSIS — Z006 Encounter for examination for normal comparison and control in clinical research program: Secondary | ICD-10-CM

## 2018-11-14 LAB — CBC WITH DIFFERENTIAL (CANCER CENTER ONLY)
Abs Immature Granulocytes: 0 10*3/uL (ref 0.00–0.07)
Basophils Absolute: 0 10*3/uL (ref 0.0–0.1)
Basophils Relative: 1 %
Eosinophils Absolute: 0.1 10*3/uL (ref 0.0–0.5)
Eosinophils Relative: 4 %
HCT: 39.9 % (ref 36.0–46.0)
Hemoglobin: 13.1 g/dL (ref 12.0–15.0)
Immature Granulocytes: 0 %
Lymphocytes Relative: 28 %
Lymphs Abs: 0.8 10*3/uL (ref 0.7–4.0)
MCH: 30.8 pg (ref 26.0–34.0)
MCHC: 32.8 g/dL (ref 30.0–36.0)
MCV: 93.9 fL (ref 80.0–100.0)
Monocytes Absolute: 0.2 10*3/uL (ref 0.1–1.0)
Monocytes Relative: 8 %
Neutro Abs: 1.7 10*3/uL (ref 1.7–7.7)
Neutrophils Relative %: 59 %
Platelet Count: 228 10*3/uL (ref 150–400)
RBC: 4.25 MIL/uL (ref 3.87–5.11)
RDW: 12.5 % (ref 11.5–15.5)
WBC Count: 2.9 10*3/uL — ABNORMAL LOW (ref 4.0–10.5)
nRBC: 0 % (ref 0.0–0.2)

## 2018-11-14 LAB — CMP (CANCER CENTER ONLY)
ALT: 26 U/L (ref 0–44)
AST: 27 U/L (ref 15–41)
Albumin: 4.3 g/dL (ref 3.5–5.0)
Alkaline Phosphatase: 64 U/L (ref 38–126)
Anion gap: 9 (ref 5–15)
BUN: 16 mg/dL (ref 8–23)
CO2: 25 mmol/L (ref 22–32)
Calcium: 8.9 mg/dL (ref 8.9–10.3)
Chloride: 106 mmol/L (ref 98–111)
Creatinine: 0.79 mg/dL (ref 0.44–1.00)
GFR, Est AFR Am: >60 mL/min (ref >60)
GFR, Estimated: >60 mL/min (ref >60)
Glucose, Bld: 99 mg/dL (ref 70–99)
Potassium: 3.9 mmol/L (ref 3.5–5.1)
Sodium: 140 mmol/L (ref 135–145)
Total Bilirubin: 0.6 mg/dL (ref 0.3–1.2)
Total Protein: 7 g/dL (ref 6.5–8.1)

## 2018-11-14 LAB — BILIRUBIN, DIRECT: Bilirubin, Direct: 0.1 mg/dL (ref 0.0–0.2)

## 2018-11-14 LAB — PHOSPHORUS: Phosphorus: 3.6 mg/dL (ref 2.5–4.6)

## 2018-11-14 LAB — LACTATE DEHYDROGENASE: LDH: 206 U/L — ABNORMAL HIGH (ref 98–192)

## 2018-11-14 LAB — AMYLASE: Amylase: 45 U/L (ref 28–100)

## 2018-11-14 LAB — GAMMA GT: GGT: 33 U/L (ref 7–50)

## 2018-11-14 LAB — RESEARCH LABS

## 2018-11-14 LAB — URIC ACID: Uric Acid, Serum: 3.4 mg/dL (ref 2.5–7.1)

## 2018-11-14 LAB — MAGNESIUM: Magnesium: 2.1 mg/dL (ref 1.7–2.4)

## 2018-11-14 LAB — LIPASE, BLOOD: Lipase: 29 U/L (ref 11–51)

## 2018-11-14 MED ORDER — ANASTROZOLE 1 MG PO TABS: 1.0000 mg | ORAL_TABLET | Freq: Every day | ORAL | 4 refills | Status: DC

## 2018-11-14 NOTE — Research (Signed)
[name] Screening  11/14/18 Patient Sonia Sandoval is a post menopausal female who arrives unaccompanied for the purpose of completing screening procedures on the [name] trial.    IRT - Patient previously provided informed consent on 11/09/18 and was registered into Screening on that day.  The patient's number is [id number].  Tumor Tissue - Protocol required tissue specimen, from biopsy and surgical sample, were requested on 11/09/18.  These samples have been received and are to be shipped at a later date.  Questionnaires - Patient was provided with questionnaires to complete at the beginning of her visit, prior to the completion of other procedures.  This RN reviewed for completeness.  Labs - Patient confirms that she has been fasting prior to her lab appointment this morning.  Menopausal status already confirmed as postmenopausal (patient >3 years of age, previously confirmed LMP was several years ago), so pregnancy testing and hormone level testing not required.     Corrected Calcium Formula:  4 g/dL utilized for "normal albumin;" local normal range is 3.5-5 g/dL.  Corrected Calcium = (0.8 x [normal albumin - patient's albumin]) + serum calcium  Corrected Calcium = (0.8 x [4-4.3]) + 8.9  Corrected Calcium = 8.66 mg/dL  eGFR was calculated to be 78 mL/min/1.66m  Vital Signs - Patient's blood pressure and heart rate were measured after patient was resting for greater than 5 minutes.  See Office Visit Encounter from 11/14/18 with Dr. MJana Hakimfor recorded vital signs.  ECG - The patient was resting for greater than 10 minutes prior to collection of ECG.  Research Nurse CFoye Spurling RN completed ECG recording with the patient on Sponsor provided equipment.  Dr. MJana Hakimreviewed the ECG during the visit today. Confirmation of ECG eligibility from study team is not yet available at the time of this note.  Heart Rate was between 50-90 bpm  QTc Interval <4520mc  Physical Exam and Recurrence  Assessment - Patient was seen today by Dr. MaJana Hakim Please see his documentation in the Office Visit Encounter dated 11/14/18.  Medical History and Concomitant Medication Review - Medical History and concomitant medications were reviewed with patient and updated.  Patient confirms that she takes vitamins for general health.  Patient confirms reported medication allergy and denies any food allergies.  No reported history of GI, cardiac, infectious disease, or other uncontrolled medical condition.   Visit Scheduling - The patient was thanked for her time and willingness to participate in the [place].  This RN to contact patient to schedule randomization visit within the next 28 days, if patient is eligible for study participation.  Patient verbalizes understanding and willingness to participate in all study visits and procedures even if she is assigned to observation arm at time of randomization.  StDoreatha MartinRN, BSN, [facility]/15/2020 11:14 AM

## 2018-11-15 ENCOUNTER — Encounter: Payer: Self-pay | Admitting: *Deleted

## 2018-11-15 ENCOUNTER — Telehealth: Payer: Self-pay | Admitting: *Deleted

## 2018-11-15 ENCOUNTER — Other Ambulatory Visit: Payer: Self-pay | Admitting: *Deleted

## 2018-11-15 DIAGNOSIS — C50511 Malignant neoplasm of lower-outer quadrant of right female breast: Secondary | ICD-10-CM

## 2018-11-15 DIAGNOSIS — Z170 Estrogen receptor positive status [ER+]: Secondary | ICD-10-CM

## 2018-11-15 NOTE — Telephone Encounter (Signed)
11/15/18 3:35 PM  This RN called patient Sonia Sandoval regarding need for additional lab test for the [facility] sample.  Coagulation test were inadvertently overlooked when ordering tests, so these were missed.  This RN apologized for the error.  The patient expressed understanding and is willing to come in tomorrow to have labs drawn.  Patient also notified this RN of an additional medication that she forgot to mention yesterday.  Medication list updated.  The patient was thanked for her patience and understanding.  [name], RN, BSN, [facility] 11/15/2018 3:38 PM

## 2018-11-16 ENCOUNTER — Inpatient Hospital Stay: Payer: BC Managed Care – PPO

## 2018-11-16 ENCOUNTER — Encounter: Payer: Self-pay | Admitting: *Deleted

## 2018-11-16 ENCOUNTER — Other Ambulatory Visit: Payer: Self-pay | Admitting: *Deleted

## 2018-11-16 ENCOUNTER — Other Ambulatory Visit: Payer: Self-pay

## 2018-11-16 ENCOUNTER — Telehealth: Payer: Self-pay | Admitting: *Deleted

## 2018-11-16 DIAGNOSIS — C50511 Malignant neoplasm of lower-outer quadrant of right female breast: Secondary | ICD-10-CM

## 2018-11-16 LAB — APTT: aPTT: 28 seconds (ref 24–36)

## 2018-11-16 LAB — PROTIME-INR
INR: 1 (ref 0.8–1.2)
Prothrombin Time: 12.9 seconds (ref 11.4–15.2)

## 2018-11-16 NOTE — Telephone Encounter (Signed)
11/16/18 3:49 PM This RN received a message that patient had a question about the trial.  RN called patient, no answer so voicemail was left with contact information.  [name], RN, BSN, [facility] 11/16/2018 4:09 PM

## 2018-11-20 ENCOUNTER — Encounter: Payer: Self-pay | Admitting: Oncology

## 2018-11-21 NOTE — Telephone Encounter (Signed)
[name]  11/21/18 4:35 PM  This RN attempted to contact patient to update her on [name] status and attempt to answer her question regarding the trial.  No answer; left voicemail.  [name], RN, BSN, [facility] 11/21/2018 4:35 PM

## 2018-11-22 NOTE — Telephone Encounter (Signed)
11/22/18 4:27 PM Attempted to reach patient; no answer.  Left voicemail stating that I would call back at a later date.  [name], RN, BSN, [facility] 11/22/2018 4:27 PM

## 2018-11-27 ENCOUNTER — Other Ambulatory Visit: Payer: Self-pay | Admitting: Oncology

## 2018-11-27 DIAGNOSIS — C50511 Malignant neoplasm of lower-outer quadrant of right female breast: Secondary | ICD-10-CM

## 2018-11-27 NOTE — Telephone Encounter (Signed)
11/27/18 12:36 PM  This RN was able to reach patient Sonia Sandoval over the phone.  Explained that was not eligible to participate in the [place] trial, due to having only some lymph nodes removed at the time of her surgery.  This RN thanked the patient for her time and willingness to consider the trial.  The patient verbalized an understanding.  The patient did ask about abnormal lab results of WBC and LDH.  Patient also mentioned she had several almost outside of "beige normal line."  This RN explained that the only abnormal results were the WBC and LDH.  Patient would like a MyChart message to confirm if any follow up is required.  [name], RN, BSN, [facility] 11/27/2018 12:39 PM

## 2018-11-27 NOTE — Telephone Encounter (Signed)
11/27/18  This RN attempted to contact patient; left voicemail requesting call back.  Provided contact phone number.  [name], RN, BSN, [facility] 11/27/2018 12:15 PM

## 2018-11-28 ENCOUNTER — Encounter: Payer: Self-pay | Admitting: *Deleted

## 2018-11-28 NOTE — Progress Notes (Signed)
[name] Screen Failure  For the purpose of documentation clarity:  Patient Sonia Sandoval was a screen failure for the [name] trial because she did not have a full axillary node dissection, which is a protocol requirement per inclusion criteria number 8.  The patient was notified of screen failure (see telephone call documentation) and was subsequently listed as a Screen Failure in the IRT system.  The patient did not report any Adverse Events related to the trial during her time in screening, and so did not receive any concomitant medications to treat an Adverse Event.  [name], RN, BSN, [facility] 11/28/2018 9:42 AM

## 2018-12-12 ENCOUNTER — Encounter: Payer: Self-pay | Admitting: *Deleted

## 2018-12-12 NOTE — Progress Notes (Signed)
[name] Trial, Scanned Paper Trial Documents  I certify that all documents with the label RESEARCH dated 11/20/2018 scanned into the Electronic Medical Record have the same information, data, content, and structure as the original paper source documents.  A label noting the patient's identifying information was added to the scanned version in order to confirm correct patient for addition into the medical record.  I verify that this patient label in no way obscures information from the paper source document.  [name], RN, BSN, [facility] 12/12/2018 12:26 PM

## 2019-02-20 NOTE — Progress Notes (Signed)
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 She is status post bilateral mastectomies.  She continues on anastrozole.  For a long time she had absolutely no side effects or symptoms related to this but more recently she has developed occasional hot flashes followed by a little bit of a cold spell.  This can happen once or at most twice a day.  It does not happen every day.  It does not wake her up at night.  Sonia Sandoval's last bone density screening on 03/07/2018, showed a T-score of -1.8, which is considered osteopenic.    REVIEW OF SYSTEMS: Sonia Sandoval remains very active, note only in her garden, but also walking.  She has a fit bit and she does more than 10,000 steps most days.  She denies any unusual headaches visual changes cough phlegm production pleurisy or shortness of breath.  There is been no change in bowel or bladder habits.  She does have a little bit of itching on her left chest wall and she wanted me to look at that.  Detailed review of systems today was otherwise stable    HISTORY OF CURRENT ILLNESS: From the original intake note:  "Sonia Sandoval" felt that her right breast was noticeably smaller than the left. She brought this to her nurse practitioner's attention.  She had not had mammography  in several years.  Sonia Sandoval was set up for bilateral diagnostic mammography with tomography and right breast ultrasonography at [facility] on 10/19/2017 showing: Breast density category B.  Highly suspicious mass in the right lower outer quadrant measuring 3.3 x 2.8 x 3.2 cm,  2 cm from the nipple with associated skin retraction. The right axilla showed a 9 mm lymph node with cortical thickening. There was no evidence of malignancy in the left breast.   Accordingly on 10/23/2017 she proceeded to biopsy of the right breast area in question. The pathology from this procedure showed ([id number]): Invasive ductal carcinoma grade III. The right axillary lymph node was also positive.  There was no lymph node tissue identified. Prognostic indicators significant for: estrogen receptor, 95% positive and progesterone receptor, 45% positive, both with strong staining intensity. Proliferation marker Ki67 at 20%. HER2  not amplified with ratios HER2/CEP17 signals 1.25 and average HER2 copies per cell 3.00  The patient's subsequent history is as detailed below.   PAST MEDICAL HISTORY: Past Medical History:  Diagnosis Date  . Allergies 1995   Per patient report 11/14/18  . Breast cancer ([place])   . Breast cancer, right breast ([place]) 09/2017   S/P mastectomy 12/05/2017  . Headache 1968   per patient report, started as a teenager  . 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  . Osteopenia 03/2018   Seen on DEXA Scan  . PONV (postoperative nausea and vomiting)  She denies a history of cataracts, glaucoma, emphysema, asthma, HTN, heart murmur or palpitations, GERD, stomach ulcers, issues with bowels or bladder.     PAST SURGICAL HISTORY: Past Surgical History:  Procedure Laterality Date  . BACK SURGERY  2003   Patient will occassional back pain since 2003  . BREAST BIOPSY Right 09/2017  . [place]; 1992  . DILATION AND CURETTAGE OF UTERUS    . [place] SURGERY  2003  . MASTECTOMY Left 12/05/2017   PROPHYLACTIC MASTECTOMY  . MASTECTOMY COMPLETE / SIMPLE W/ SENTINEL NODE BIOPSY Right 12/05/2017   WITH RADIOACTIVE SEED TARGETED RIGHT AXIILARY LYMPH NODE EXCISION AND RIGHT SENTINEL LYMPH NODE BIOPSY,   . MASTECTOMY WITH RADIOACTIVE SEED GUIDED EXCISION AND AXILLARY SENTINEL LYMPH NODE BIOPSY Bilateral 12/05/2017   Procedure: RIGHT SIMPLE MASTECTOMY WITH RADIOACTIVE SEED TARGETED RIGHT AXIILARY LYMPH NODE EXCISION AND RIGHT SENTINEL LYMPH NODE BIOPSY, LEFT PROPHYLACTIC MASTECTOMY;  Surgeon: [name], MD;  Location: [facility];  Service: General;  Laterality: Bilateral;   Ruptured vertebral disk surgery in 2003 under Dr. Ellene Route   FAMILY HISTORY: Family History  Problem Relation Age of Onset  . Lung cancer Father     The patient's father died at age 65 due to lung cancer (heavy smoker). The patient's mother died at age 90 due to MI and Alzheimer's. The patient had no brothers or sisters. The patient's mother was 1/10 siblings, with a few other siblings having a history of Alzheimer's. There was a maternal aunt who may have had ovarian cancer, and the patient is going to verify this with family.    GYNECOLOGIC HISTORY:  No LMP recorded. Patient is postmenopausal. Menarche: 68 years old Age at first live birth: 68 years old She is GXP4.  LMP: about 25 years ago ( early 36's). She used hormone replacement for about 3-5 years. She also used oral contraception for 1 year with no complications.    SOCIAL HISTORY:  Sonia Sandoval is retired from working at [employer]. Her husband of 27 years, Sonia Sandoval, is a Writer. He also used to be a paramedic. The patient and her husband share 1 daughter, Sonia Sandoval age 29 who lives in [place], [place] as a Writer. The patient has 3 sons from a previous marriage. The oldest, Sonia Sandoval age 71 is disabled with  epilepsy. The second son Sonia Sandoval age 33, lives in [place], [place] in Press photographer. The third son, Sonia Sandoval age 36, lives in [place], [place] in sales.   ADVANCED DIRECTIVES: The patient's husband, Sonia Sandoval is her HCPOA   HEALTH MAINTENANCE: Social History   Tobacco Use  . Smoking status: Never Smoker  . Smokeless tobacco: Never Used  Substance Use Topics  . Alcohol use: Yes    Alcohol/week: 14.0 standard drinks    Types: 14 Glasses of wine per week  . Drug use: Never     Colonoscopy: 2013 in [place]  PAP: 2014  Bone density: 2014/ osteopenia   Allergies  Allergen Reactions  . Codeine Nausea And Vomiting and Other (See Comments)    headache    Current Outpatient Medications  Medication Sig Dispense Refill  . anastrozole (ARIMIDEX) 1 MG tablet Take 1 tablet (1 mg total) by mouth daily. 90 tablet 4  . calcium carbonate (TUMS - DOSED IN MG ELEMENTAL CALCIUM) 500 MG chewable tablet Chew 1 tablet by mouth daily.    . Calcium Carbonate-Vit D-Min (CALCIUM 1200) 1200-1000 MG-UNIT CHEW Chew 1,200  mg by mouth daily.    . Cholecalciferol (VITAMIN D3) 25 MCG (1000 UT) CHEW Chew 25 mcg by mouth. 11/14/18 Patient reports taking 2 once daily (total daily dose of 81mg).  Started taking at time of breast cancer diagnosis.    . Coenzyme Q10 (COQ10 PO) Take 1 tablet by mouth daily.    .Marland Kitchenibuprofen (ADVIL,MOTRIN) 200 MG tablet Take 400 mg by mouth daily as needed for headache.    . Multiple Vitamins-Minerals (CENTRUM SILVER 50+WOMEN PO) Take 1 tablet by mouth.    . naproxen sodium (ALEVE) 220 MG tablet Take 220 mg by mouth daily as needed (back pain).    . Nutritional Supplements (JUICE PLUS FIBRE PO) Juice plus fruits x 3 chews Juice plus veggie x 3 chews Juice plus Vine x 3 chews Juice plus Omega x 2 capsule    . Pseudoephedrine-Naproxen Na (ALEVE-D SINUS & HEADACHE PO) Take 1 tablet by mouth daily as needed (sinus headaches).    . pyridOXINE (VITAMIN B-6) 100 MG tablet Take 100 mg  by mouth daily.    . vitamin C (ASCORBIC ACID) 500 MG tablet Take 500 mg by mouth daily.    . Vitamin E 180 MG CAPS Take 1 tablet by mouth daily.     No current facility-administered medications for this visit.     OBJECTIVE: Middle-aged white woman who appears stated age  V24   02/21/19 1123  BP: 118/63  Pulse: 75  Resp: 18  Temp: 98.5 F (36.9 C)  SpO2: 100%     Body mass index is 25.72 kg/m.   Wt Readings from Last 3 Encounters:  02/21/19 145 lb 3.2 oz (65.9 kg)  11/14/18 144 lb 3.2 oz (65.4 kg)  06/26/18 144 lb 3.2 oz (65.4 kg)      ECOG FS:1 - Symptomatic but completely ambulatory  Sclerae unicteric, EOMs intact Wearing a mask No cervical or supraclavicular adenopathy Lungs no rales or rhonchi Heart regular rate and rhythm Abd soft, nontender, positive bowel sounds MSK no focal spinal tenderness, no upper extremity lymphedema Neuro: nonfocal, well oriented, appropriate affect Breasts: Status post bilateral mastectomies.  There is no evidence of chest wall recurrence.  Both axillae are benign. Skin: There are multiple moles which appear benign but there is one lesion that has irregular borders and different colors.  This is imaged below.  Left flank lesion 0CT 2020     LAB RESULTS:  CMP     Component Value Date/Time   NA 140 11/14/2018 0920   K 3.9 11/14/2018 0920   CL 106 11/14/2018 0920   CO2 25 11/14/2018 0920   GLUCOSE 99 11/14/2018 0920   BUN 16 11/14/2018 0920   CREATININE 0.79 11/14/2018 0920   CALCIUM 8.9 11/14/2018 0920   PROT 7.0 11/14/2018 0920   ALBUMIN 4.3 11/14/2018 0920   AST 27 11/14/2018 0920   ALT 26 11/14/2018 0920   ALKPHOS 64 11/14/2018 0920   BILITOT 0.6 11/14/2018 0920   GFRNONAA >60 11/14/2018 0920   GFRAA >60 11/14/2018 0920    No results found for: TOTALPROTELP, ALBUMINELP, A1GS, A2GS, BETS, BETA2SER, GAMS, MSPIKE, SPEI  No results found for: KPAFRELGTCHN, LAMBDASER, KAPLAMBRATIO  Lab Results  Component Value  Date   WBC 3.9 (L) 02/21/2019   NEUTROABS 2.4 02/21/2019   HGB 12.7 02/21/2019   HCT 37.8 02/21/2019   MCV 91.5 02/21/2019   PLT 226 02/21/2019    _0 @  No results found for: LABCA2  No components found for: LJOACZY606  No results for input(s): INR in the last 168 hours.  No results found for: LABCA2  No results found for: CZY606  No results found for: TKZ601  No results found for: UXN235  No results found for: CA2729  No components found for: HGQUANT  No results found for: CEA1 / No results found for: CEA1   No results found for: AFPTUMOR  No results found for: CHROMOGRNA  No results found for: PSA1  Appointment on 02/21/2019  Component Date Value Ref Range Status  . WBC 02/21/2019 3.9* 4.0 - 10.5 K/uL Final  . RBC 02/21/2019 4.13  3.87 - 5.11 MIL/uL Final  . Hemoglobin 02/21/2019 12.7  12.0 - 15.0 g/dL Final  . HCT 02/21/2019 37.8  36.0 - 46.0 % Final  . MCV 02/21/2019 91.5  80.0 - 100.0 fL Final  . MCH 02/21/2019 30.8  26.0 - 34.0 pg Final  . MCHC 02/21/2019 33.6  30.0 - 36.0 g/dL Final  . RDW 02/21/2019 12.3  11.5 - 15.5 % Final  . Platelets 02/21/2019 226  150 - 400 K/uL Final  . nRBC 02/21/2019 0.0  0.0 - 0.2 % Final  . Neutrophils Relative % 02/21/2019 61  % Final  . Neutro Abs 02/21/2019 2.4  1.7 - 7.7 K/uL Final  . Lymphocytes Relative 02/21/2019 26  % Final  . Lymphs Abs 02/21/2019 1.0  0.7 - 4.0 K/uL Final  . Monocytes Relative 02/21/2019 9  % Final  . Monocytes Absolute 02/21/2019 0.3  0.1 - 1.0 K/uL Final  . Eosinophils Relative 02/21/2019 3  % Final  . Eosinophils Absolute 02/21/2019 0.1  0.0 - 0.5 K/uL Final  . Basophils Relative 02/21/2019 1  % Final  . Basophils Absolute 02/21/2019 0.0  0.0 - 0.1 K/uL Final  . Immature Granulocytes 02/21/2019 0  % Final  . Abs Immature Granulocytes 02/21/2019 0.01  0.00 - 0.07 K/uL Final   Performed at [facility], [place] [address], [place], [place] [address]    (this  displays the last labs from the last 3 days)  No results found for: TOTALPROTELP, ALBUMINELP, A1GS, A2GS, BETS, BETA2SER, GAMS, MSPIKE, SPEI (this displays SPEP labs)  No results found for: KPAFRELGTCHN, LAMBDASER, KAPLAMBRATIO (kappa/lambda light chains)  No results found for: HGBA, HGBA2QUANT, HGBFQUANT, HGBSQUAN (Hemoglobinopathy evaluation)   Lab Results  Component Value Date   LDH 206 (H) 11/14/2018    No results found for: IRON, TIBC, IRONPCTSAT (Iron and TIBC)  No results found for: FERRITIN  Urinalysis No results found for: COLORURINE, APPEARANCEUR, LABSPEC, PHURINE, GLUCOSEU, HGBUR, BILIRUBINUR, KETONESUR, PROTEINUR, UROBILINOGEN, NITRITE, LEUKOCYTESUR   STUDIES: No results found.   ELIGIBLE FOR AVAILABLE RESEARCH PROTOCOL: no   ASSESSMENT: 68 y.o. [place], [place] woman that is post right breast lower outer quadrant biopsy 10/23/2017 for a clinical T2 N1, stage IIB invasive ductal carcinoma, grade 3, estrogen and progesterone receptor positive, HER-2 not amplified, with an MIB-1 of 20%  (1) Mammaprint on 11/07/2017 was read as low risk, predicting no significant chemotherapy benefit with a 5-year distant disease-free survival in the 97-98% range with hormone therapy alone  (2) status post bilateral mastectomies on 12/05/2017 showing  (a) on the left, no evidence of malignancy  (b) on the right, a pT2 pN1, stage IIB invasive ductal carcinoma, grade 3, with negative margins.  (3) adjuvant radiation 01/15/2018 - 02/28/2018  Site/dose: The patient initially received a dose of 50.4 Gy in 28 fractions to the chest wall and supraclavicular region. This was delivered using  a 3-D conformal, 4 field technique. The patient then received a boost to the mastectomy scar. This delivered an additional 10 Gy in 5 fractions using an en face electron field. The total dose was 60.4 Gy.   (4) started anastrozole 04/01/2018  (a) bone density 03/07/2018 with a T score of -1.8   PLAN:  Sonia Sandoval is now a little over a year out from definitive surgery for her breast cancer with no evidence of disease recurrence.  This is very favorable.  On the left chest wall side there is a minimal erythema mild telangiectasia and the area of slight change has a very straight vertical border which is entirely consistent with the radiation port reaction except that she did not receive radiation to the left side.  I am not sure why she would be having a little bit of change in this area.  This requires only follow-up however.  I alerted her regarding the lesion on her left flank.  She tells me she already has an appointment with her dermatology in a few weeks and that would be adequate.  I will make sure to send him a copy of this dictation  Otherwise she will return to see me in 1 year.  She knows to call for any other issue that may develop before that visit.  Magrinat, [name], MD  02/21/19 11:30 AM Medical Oncology and Hematology [facility] [address] [place], [place] [address] Tel. [phone]    Fax. [phone]   I, Wilburn Mylar, am acting as scribe for Dr. [name]. Magrinat.  I, Lurline Del MD, have reviewed the above documentation for accuracy and completeness, and I agree with the above.

## 2019-02-21 ENCOUNTER — Inpatient Hospital Stay: Payer: BC Managed Care – PPO

## 2019-02-21 ENCOUNTER — Encounter: Payer: Self-pay | Admitting: Oncology

## 2019-02-21 ENCOUNTER — Inpatient Hospital Stay: Payer: BC Managed Care – PPO | Attending: Oncology | Admitting: Oncology

## 2019-02-21 ENCOUNTER — Other Ambulatory Visit: Payer: Self-pay

## 2019-02-21 VITALS — BP 118/63 | HR 75 | Temp 98.5°F | Resp 18 | Ht 63.0 in | Wt 145.2 lb

## 2019-02-21 DIAGNOSIS — I781 Nevus, non-neoplastic: Secondary | ICD-10-CM | POA: Insufficient documentation

## 2019-02-21 DIAGNOSIS — L539 Erythematous condition, unspecified: Secondary | ICD-10-CM | POA: Insufficient documentation

## 2019-02-21 DIAGNOSIS — C50511 Malignant neoplasm of lower-outer quadrant of right female breast: Secondary | ICD-10-CM

## 2019-02-21 DIAGNOSIS — R232 Flushing: Secondary | ICD-10-CM | POA: Diagnosis not present

## 2019-02-21 DIAGNOSIS — Z801 Family history of malignant neoplasm of trachea, bronchus and lung: Secondary | ICD-10-CM | POA: Diagnosis not present

## 2019-02-21 DIAGNOSIS — M8580 Other specified disorders of bone density and structure, unspecified site: Secondary | ICD-10-CM | POA: Insufficient documentation

## 2019-02-21 DIAGNOSIS — C773 Secondary and unspecified malignant neoplasm of axilla and upper limb lymph nodes: Secondary | ICD-10-CM | POA: Diagnosis not present

## 2019-02-21 DIAGNOSIS — Z170 Estrogen receptor positive status [ER+]: Secondary | ICD-10-CM

## 2019-02-21 DIAGNOSIS — Z9013 Acquired absence of bilateral breasts and nipples: Secondary | ICD-10-CM | POA: Insufficient documentation

## 2019-02-21 DIAGNOSIS — Z79899 Other long term (current) drug therapy: Secondary | ICD-10-CM | POA: Diagnosis not present

## 2019-02-21 LAB — LACTATE DEHYDROGENASE: LDH: 181 U/L (ref 98–192)

## 2019-02-21 LAB — COMPREHENSIVE METABOLIC PANEL
ALT: 23 U/L (ref 0–44)
AST: 24 U/L (ref 15–41)
Albumin: 4 g/dL (ref 3.5–5.0)
Alkaline Phosphatase: 56 U/L (ref 38–126)
Anion gap: 7 (ref 5–15)
BUN: 18 mg/dL (ref 8–23)
CO2: 27 mmol/L (ref 22–32)
Calcium: 9.7 mg/dL (ref 8.9–10.3)
Chloride: 106 mmol/L (ref 98–111)
Creatinine, Ser: 0.79 mg/dL (ref 0.44–1.00)
GFR calc Af Amer: >60 mL/min (ref >60)
GFR calc non Af Amer: >60 mL/min (ref >60)
Glucose, Bld: 104 mg/dL — ABNORMAL HIGH (ref 70–99)
Potassium: 4.1 mmol/L (ref 3.5–5.1)
Sodium: 140 mmol/L (ref 135–145)
Total Bilirubin: 0.4 mg/dL (ref 0.3–1.2)
Total Protein: 6.6 g/dL (ref 6.5–8.1)

## 2019-02-21 LAB — CBC WITH DIFFERENTIAL/PLATELET
Abs Immature Granulocytes: 0.01 10*3/uL (ref 0.00–0.07)
Basophils Absolute: 0 10*3/uL (ref 0.0–0.1)
Basophils Relative: 1 %
Eosinophils Absolute: 0.1 10*3/uL (ref 0.0–0.5)
Eosinophils Relative: 3 %
HCT: 37.8 % (ref 36.0–46.0)
Hemoglobin: 12.7 g/dL (ref 12.0–15.0)
Immature Granulocytes: 0 %
Lymphocytes Relative: 26 %
Lymphs Abs: 1 10*3/uL (ref 0.7–4.0)
MCH: 30.8 pg (ref 26.0–34.0)
MCHC: 33.6 g/dL (ref 30.0–36.0)
MCV: 91.5 fL (ref 80.0–100.0)
Monocytes Absolute: 0.3 10*3/uL (ref 0.1–1.0)
Monocytes Relative: 9 %
Neutro Abs: 2.4 10*3/uL (ref 1.7–7.7)
Neutrophils Relative %: 61 %
Platelets: 226 10*3/uL (ref 150–400)
RBC: 4.13 MIL/uL (ref 3.87–5.11)
RDW: 12.3 % (ref 11.5–15.5)
WBC: 3.9 10*3/uL — ABNORMAL LOW (ref 4.0–10.5)
nRBC: 0 % (ref 0.0–0.2)

## 2019-02-21 MED ORDER — ANASTROZOLE 1 MG PO TABS: 1.0000 mg | ORAL_TABLET | Freq: Every day | ORAL | 4 refills | Status: DC

## 2019-02-22 ENCOUNTER — Telehealth: Payer: Self-pay | Admitting: Oncology

## 2019-02-22 NOTE — Telephone Encounter (Signed)
I talk with patient regarding schedule  

## 2019-04-29 ENCOUNTER — Encounter: Payer: Self-pay | Admitting: Oncology

## 2019-04-30 ENCOUNTER — Telehealth: Payer: Self-pay | Admitting: *Deleted

## 2019-04-30 NOTE — Telephone Encounter (Signed)
This RN called pt per her mychart message regarding noted area of concern on chest and getting a PET scan.  Of note pt has history of bilateral mastectomies and area of concern was noted at last visit with recommendation for pt to see dermatology.  She has not been seen by a dermatologist at this time.  Area of concern is on left breast adjacent to surgical site.  She states area "seems larger " then when she was seen by Dr Jannifer Rodney in October.  This RN informed pt PET scan would likely not be covered by insurance at this time due to need for further work up.  Per discussion pt would prefer to see Dr Brantley Stage for further assesment ( dermatology appointments are currently 3 months out )  This note will be sent to Dr Brantley Stage for review for appointment. Pt was advised to contact his office for an appointment.

## 2019-09-20 ENCOUNTER — Observation Stay (HOSPITAL_BASED_OUTPATIENT_CLINIC_OR_DEPARTMENT_OTHER)
Admission: EM | Admit: 2019-09-20 | Discharge: 2019-09-24 | Disposition: A | Discharge status: Home / Self Care | Payer: BC Managed Care – PPO | Attending: Internal Medicine | Admitting: Internal Medicine

## 2019-09-20 ENCOUNTER — Emergency Department (HOSPITAL_BASED_OUTPATIENT_CLINIC_OR_DEPARTMENT_OTHER): Payer: BC Managed Care – PPO

## 2019-09-20 ENCOUNTER — Encounter (HOSPITAL_BASED_OUTPATIENT_CLINIC_OR_DEPARTMENT_OTHER): Payer: Self-pay | Admitting: *Deleted

## 2019-09-20 ENCOUNTER — Other Ambulatory Visit: Payer: Self-pay

## 2019-09-20 DIAGNOSIS — Z79811 Long term (current) use of aromatase inhibitors: Secondary | ICD-10-CM | POA: Diagnosis not present

## 2019-09-20 DIAGNOSIS — Z885 Allergy status to narcotic agent status: Secondary | ICD-10-CM | POA: Insufficient documentation

## 2019-09-20 DIAGNOSIS — K644 Residual hemorrhoidal skin tags: Secondary | ICD-10-CM | POA: Insufficient documentation

## 2019-09-20 DIAGNOSIS — K449 Diaphragmatic hernia without obstruction or gangrene: Secondary | ICD-10-CM | POA: Diagnosis not present

## 2019-09-20 DIAGNOSIS — Z9013 Acquired absence of bilateral breasts and nipples: Secondary | ICD-10-CM | POA: Insufficient documentation

## 2019-09-20 DIAGNOSIS — C19 Malignant neoplasm of rectosigmoid junction: Secondary | ICD-10-CM | POA: Insufficient documentation

## 2019-09-20 DIAGNOSIS — K648 Other hemorrhoids: Secondary | ICD-10-CM | POA: Insufficient documentation

## 2019-09-20 DIAGNOSIS — C50911 Malignant neoplasm of unspecified site of right female breast: Secondary | ICD-10-CM | POA: Insufficient documentation

## 2019-09-20 DIAGNOSIS — Z79899 Other long term (current) drug therapy: Secondary | ICD-10-CM | POA: Diagnosis not present

## 2019-09-20 DIAGNOSIS — K921 Melena: Secondary | ICD-10-CM | POA: Diagnosis present

## 2019-09-20 DIAGNOSIS — Z20822 Contact with and (suspected) exposure to covid-19: Secondary | ICD-10-CM | POA: Diagnosis not present

## 2019-09-20 DIAGNOSIS — D500 Iron deficiency anemia secondary to blood loss (chronic): Secondary | ICD-10-CM | POA: Diagnosis not present

## 2019-09-20 DIAGNOSIS — K259 Gastric ulcer, unspecified as acute or chronic, without hemorrhage or perforation: Secondary | ICD-10-CM | POA: Insufficient documentation

## 2019-09-20 DIAGNOSIS — K922 Gastrointestinal hemorrhage, unspecified: Secondary | ICD-10-CM | POA: Insufficient documentation

## 2019-09-20 DIAGNOSIS — M8580 Other specified disorders of bone density and structure, unspecified site: Secondary | ICD-10-CM | POA: Diagnosis not present

## 2019-09-20 DIAGNOSIS — R0602 Shortness of breath: Secondary | ICD-10-CM | POA: Diagnosis present

## 2019-09-20 DIAGNOSIS — D649 Anemia, unspecified: Secondary | ICD-10-CM | POA: Diagnosis present

## 2019-09-20 LAB — CBC WITH DIFFERENTIAL/PLATELET
Abs Immature Granulocytes: 0.03 10*3/uL (ref 0.00–0.07)
Basophils Absolute: 0 10*3/uL (ref 0.0–0.1)
Basophils Relative: 0 %
Eosinophils Absolute: 0.1 10*3/uL (ref 0.0–0.5)
Eosinophils Relative: 2 %
HCT: 22.1 % — ABNORMAL LOW (ref 36.0–46.0)
Hemoglobin: 7.4 g/dL — ABNORMAL LOW (ref 12.0–15.0)
Immature Granulocytes: 1 %
Lymphocytes Relative: 23 %
Lymphs Abs: 1.3 10*3/uL (ref 0.7–4.0)
MCH: 31.8 pg (ref 26.0–34.0)
MCHC: 33.5 g/dL (ref 30.0–36.0)
MCV: 94.8 fL (ref 80.0–100.0)
Monocytes Absolute: 0.4 10*3/uL (ref 0.1–1.0)
Monocytes Relative: 8 %
Neutro Abs: 3.7 10*3/uL (ref 1.7–7.7)
Neutrophils Relative %: 66 %
Platelets: 227 10*3/uL (ref 150–400)
RBC: 2.33 MIL/uL — ABNORMAL LOW (ref 3.87–5.11)
RDW: 14.3 % (ref 11.5–15.5)
WBC: 5.5 10*3/uL (ref 4.0–10.5)
nRBC: 0 % (ref 0.0–0.2)

## 2019-09-20 LAB — COMPREHENSIVE METABOLIC PANEL
ALT: 43 U/L (ref 0–44)
AST: 37 U/L (ref 15–41)
Albumin: 3.6 g/dL (ref 3.5–5.0)
Alkaline Phosphatase: 51 U/L (ref 38–126)
Anion gap: 7 (ref 5–15)
BUN: 24 mg/dL — ABNORMAL HIGH (ref 8–23)
CO2: 25 mmol/L (ref 22–32)
Calcium: 8.7 mg/dL — ABNORMAL LOW (ref 8.9–10.3)
Chloride: 105 mmol/L (ref 98–111)
Creatinine, Ser: 0.72 mg/dL (ref 0.44–1.00)
GFR calc Af Amer: >60 mL/min (ref >60)
GFR calc non Af Amer: >60 mL/min (ref >60)
Glucose, Bld: 112 mg/dL — ABNORMAL HIGH (ref 70–99)
Potassium: 3.8 mmol/L (ref 3.5–5.1)
Sodium: 137 mmol/L (ref 135–145)
Total Bilirubin: 0.4 mg/dL (ref 0.3–1.2)
Total Protein: 5.6 g/dL — ABNORMAL LOW (ref 6.5–8.1)

## 2019-09-20 LAB — URINALYSIS, MICROSCOPIC (REFLEX): RBC / HPF: NONE SEEN RBC/hpf (ref 0–5)

## 2019-09-20 LAB — URINALYSIS, ROUTINE W REFLEX MICROSCOPIC
Bilirubin Urine: NEGATIVE
Glucose, UA: NEGATIVE mg/dL
Hgb urine dipstick: NEGATIVE
Ketones, ur: NEGATIVE mg/dL
Nitrite: NEGATIVE
Protein, ur: NEGATIVE mg/dL
Specific Gravity, Urine: <1.005 — ABNORMAL LOW (ref 1.005–1.030)
pH: 5.5 (ref 5.0–8.0)

## 2019-09-20 LAB — TROPONIN I (HIGH SENSITIVITY): Troponin I (High Sensitivity): 3 ng/L (ref <18)

## 2019-09-20 LAB — OCCULT BLOOD X 1 CARD TO LAB, STOOL: Fecal Occult Bld: POSITIVE — AB

## 2019-09-20 LAB — SARS CORONAVIRUS 2 BY RT PCR (HOSPITAL ORDER, PERFORMED IN ~~LOC~~ HOSPITAL LAB): SARS Coronavirus 2: NEGATIVE

## 2019-09-20 LAB — D-DIMER, QUANTITATIVE: D-Dimer, Quant: <0.27 ug/mL-FEU (ref 0.00–0.50)

## 2019-09-20 NOTE — Progress Notes (Signed)
2250 pt arrived in 1504 as a transfer from [facility]. Husband was allowed to drive them over. Pt is oriented to the room & plan for VSs during the night, call light in bed with pt. [facility] paged to notify of pts arrival to get orders. Pts husband left and will return tomorrow.

## 2019-09-20 NOTE — ED Notes (Signed)
Pt aware of need to provide urine specimen.

## 2019-09-20 NOTE — Progress Notes (Signed)
Patient presented to [facility] with SOB and lightheadedness x3 days. Had noted blood in stools as well. Hemoccult positive stool and Hgb 7.4, previously 12.7 in Oct 2020. Vitals stable but will admit to Med-Surg for GI Bleed.   Barrington Ellison, MD [facility]

## 2019-09-20 NOTE — ED Notes (Signed)
  Patient being admitted to [facility] for anemia.  MD advised patient stable enough for POV transport. Patient chart and face sheet printed and placed in folder to take with them.  Patient assisted to car and in route to [facility] for admission.

## 2019-09-20 NOTE — ED Provider Notes (Signed)
[facility] EMERGENCY DEPARTMENT Provider Note   CSN: [id number] Arrival date & time: 09/20/19  1706     History Chief Complaint  Patient presents with  . Shortness of Breath    Sonia Sandoval is a 69 y.o. female.  HPI Patient presents with shortness of breath with exertion.  Also some lightheadedness over the last 3 days.  States worse with getting up and worse with walking.  States she has been more fatigued.  States she did have an extra dog in her house this weekend but otherwise has not given her a problem.  No fevers.  No cough.  No chest pain.  Does have some chronic back pain that is unchanged.  History of breast cancer but is cancer free now as far she knows.  States she had had some previous dark stool and blood in the stool.  Not on anticoagulation.  No swelling in her legs.    Past Medical History:  Diagnosis Date  . Allergies 1995   Per patient report 11/14/18  . Breast cancer ([place])   . Breast cancer, right breast ([place]) 09/2017   S/P mastectomy 12/05/2017  . Headache 1968   per patient report, started as a teenager  . 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  . Osteopenia 03/2018   Seen on DEXA Scan  . PONV (postoperative nausea and vomiting)     Patient Active Problem List   Diagnosis Date Noted  . Malignant neoplasm of lower-outer quadrant of right breast of female, estrogen receptor positive ([place]) 10/27/2017    Past Surgical History:  Procedure Laterality Date  . BACK SURGERY  2003   Patient will occassional back pain since 2003  . BREAST BIOPSY Right 09/2017  . [place]; 1992  . DILATION AND CURETTAGE OF UTERUS    . [place] SURGERY  2003  . MASTECTOMY Left 12/05/2017   PROPHYLACTIC MASTECTOMY  . MASTECTOMY COMPLETE / SIMPLE W/ SENTINEL NODE BIOPSY Right 12/05/2017   WITH RADIOACTIVE SEED TARGETED RIGHT AXIILARY LYMPH  NODE EXCISION AND RIGHT SENTINEL LYMPH NODE BIOPSY,   . MASTECTOMY WITH RADIOACTIVE SEED GUIDED EXCISION AND AXILLARY SENTINEL LYMPH NODE BIOPSY Bilateral 12/05/2017   Procedure: RIGHT SIMPLE MASTECTOMY WITH RADIOACTIVE SEED TARGETED RIGHT AXIILARY LYMPH NODE EXCISION AND RIGHT SENTINEL LYMPH NODE BIOPSY, LEFT PROPHYLACTIC MASTECTOMY;  Surgeon: [name], MD;  Location: [facility];  Service: General;  Laterality: Bilateral;     OB History   No obstetric history on file.     Family History  Problem Relation Age of Onset  . Lung cancer Father     Social History   Tobacco Use  . Smoking status: Never Smoker  . Smokeless tobacco: Never Used  Substance Use Topics  . Alcohol use: Yes    Alcohol/week: 14.0 standard drinks    Types: 14 Glasses of wine per week  . Drug use: Never    Home Medications Prior to Admission medications   Medication Sig Start Date End Date Taking? Authorizing Provider  anastrozole (ARIMIDEX) 1 MG tablet Take 1 tablet (1 mg total) by mouth daily. 02/21/19   Magrinat, [name], MD  calcium carbonate (TUMS - DOSED IN MG ELEMENTAL CALCIUM) 500 MG chewable tablet Chew 1 tablet by mouth daily.    [provider]  Calcium Carbonate-Vit D-Min (CALCIUM 1200) 1200-1000 MG-UNIT CHEW Chew 1,200 mg by mouth  daily.    [provider]  Cholecalciferol (VITAMIN D3) 25 MCG (1000 UT) CHEW Chew 25 mcg by mouth. 11/14/18 Patient reports taking 2 once daily (total daily dose of 64mcg).  Started taking at time of breast cancer diagnosis.    [provider]  Coenzyme Q10 (COQ10 PO) Take 1 tablet by mouth daily.    [provider]  ibuprofen (ADVIL,MOTRIN) 200 MG tablet Take 400 mg by mouth daily as needed for headache.    [provider]  Multiple Vitamins-Minerals (CENTRUM SILVER 50+WOMEN PO) Take 1 tablet by mouth.    [provider]  naproxen sodium (ALEVE) 220 MG tablet Take 220 mg by mouth daily as needed (back pain).     [provider]  Nutritional Supplements (JUICE PLUS FIBRE PO) Juice plus fruits x 3 chews Juice plus veggie x 3 chews Juice plus Vine x 3 chews Juice plus Omega x 2 capsule    [provider]  Pseudoephedrine-Naproxen Na (ALEVE-D SINUS & HEADACHE PO) Take 1 tablet by mouth daily as needed (sinus headaches).    [provider]  pyridOXINE (VITAMIN B-6) 100 MG tablet Take 100 mg by mouth daily.    [provider]  vitamin C (ASCORBIC ACID) 500 MG tablet Take 500 mg by mouth daily.    [provider]  Vitamin E 180 MG CAPS Take 1 tablet by mouth daily.    [provider]    Allergies    Codeine  Review of Systems   Review of Systems  Constitutional: Negative for appetite change.  HENT: Negative for congestion.   Respiratory: Positive for shortness of breath.   Cardiovascular: Negative for chest pain and leg swelling.  Gastrointestinal: Negative for abdominal pain.  Genitourinary: Negative for flank pain.  Musculoskeletal: Positive for back pain.  Psychiatric/Behavioral: Negative for confusion.    Physical Exam Updated Vital Signs BP (!) 115/48   Pulse 85   Temp 98.2 F (36.8 C) (Oral)   Resp 19   Ht 5\' 3"  (1.6 m)   Wt 63.5 kg   SpO2 100%   BMI 24.80 kg/m   Physical Exam Vitals and nursing note reviewed.  HENT:     Head: Normocephalic.  Eyes:     Extraocular Movements: Extraocular movements intact.  Neck:     Thyroid: No thyromegaly.  Cardiovascular:     Comments: Mild tachycardia Pulmonary:     Breath sounds: No wheezing, rhonchi or rales.  Chest:     Chest wall: No tenderness.  Abdominal:     Tenderness: There is no abdominal tenderness.  Musculoskeletal:     Right lower leg: No edema.     Left lower leg: No edema.  Skin:    Capillary Refill: Capillary refill takes less than 2 seconds.  Neurological:     Mental Status: She is alert and oriented to person, place, and time.     ED Results / Procedures  / Treatments   Labs (all labs ordered are listed, but only abnormal results are displayed) Labs Reviewed  CBC WITH DIFFERENTIAL/PLATELET - Abnormal; Notable for the following components:      Result Value   RBC 2.33 (*)    Hemoglobin 7.4 (*)    HCT 22.1 (*)    All other components within normal limits  COMPREHENSIVE METABOLIC PANEL - Abnormal; Notable for the following components:   Glucose, Bld 112 (*)    BUN 24 (*)    Calcium 8.7 (*)    Total  Protein 5.6 (*)    All other components within normal limits  URINALYSIS, ROUTINE W REFLEX MICROSCOPIC - Abnormal; Notable for the following components:   Specific Gravity, Urine <1.005 (*)    Leukocytes,Ua SMALL (*)    All other components within normal limits  OCCULT BLOOD X 1 CARD TO LAB, STOOL - Abnormal; Notable for the following components:   Fecal Occult Bld POSITIVE (*)    All other components within normal limits  URINALYSIS, MICROSCOPIC (REFLEX) - Abnormal; Notable for the following components:   Bacteria, UA RARE (*)    All other components within normal limits  D-DIMER, QUANTITATIVE (NOT AT [facility])  TROPONIN I (HIGH SENSITIVITY)    EKG EKG Interpretation  Date/Time:  Friday Sep 20 2019 17:17:58 EDT Ventricular Rate:  88 PR Interval:    QRS Duration: 89 QT Interval:  364 QTC Calculation: 441 R Axis:   66 Text Interpretation: Sinus rhythm Baseline wander in lead(s) V1 Confirmed by Davonna Belling [phone]) on 09/20/2019 5:26:40 PM   Radiology DG Chest 2 View  Result Date: 09/20/2019 CLINICAL DATA:  Shortness of breath on exertion EXAM: CHEST - 2 VIEW COMPARISON:  None. FINDINGS: Cardiac shadow is within normal limits. The lungs are hyperinflated consistent with COPD. Bilateral breast surgery is noted. The lungs are clear. Mild apical scarring is noted. No bony abnormality is seen. IMPRESSION: COPD without acute abnormality. Electronically Signed   By: Inez Catalina M.D.   On: 09/20/2019 18:04    Procedures Procedures  (including critical care time)  Medications Ordered in ED Medications - No data to display  ED Course  I have reviewed the triage vital signs and the nursing notes.  Pertinent labs & imaging results that were available during my care of the patient were reviewed by me and considered in my medical decision making (see chart for details).    MDM Rules/Calculators/A&P                     Patient presented shortness of breath.  History of breast cancer.  X-ray reassuring.  However found to have a hemoglobin of 7.  Patient states she has had some bloody stool.  Rectal exam showed brown stool with some blood.  Patient is symptomatic and that she is short of breath and is decreasing her activity and more fatigued.  I feel the patient benefit from admission to the hospital for transfusion and likely be seen by GI.  Patient states that she is going to travel by private vehicle does not want to go by ambulance.  I think she is stable enough for this.  Will discuss with hospitalist Final Clinical Impression(s) / ED Diagnoses Final diagnoses:  Anemia, unspecified type  Gastrointestinal hemorrhage, unspecified gastrointestinal hemorrhage type    Rx / DC Orders ED Discharge Orders    None       Davonna Belling, MD 09/21/19 0021

## 2019-09-20 NOTE — ED Triage Notes (Signed)
Pt c/o SOB with exertion and dizziness x 3 days

## 2019-09-21 ENCOUNTER — Encounter (HOSPITAL_COMMUNITY): Payer: Self-pay | Admitting: Family Medicine

## 2019-09-21 DIAGNOSIS — D649 Anemia, unspecified: Secondary | ICD-10-CM | POA: Diagnosis present

## 2019-09-21 DIAGNOSIS — D500 Iron deficiency anemia secondary to blood loss (chronic): Secondary | ICD-10-CM | POA: Diagnosis not present

## 2019-09-21 LAB — BASIC METABOLIC PANEL
Anion gap: 7 (ref 5–15)
BUN: 24 mg/dL — ABNORMAL HIGH (ref 8–23)
CO2: 25 mmol/L (ref 22–32)
Calcium: 8.4 mg/dL — ABNORMAL LOW (ref 8.9–10.3)
Chloride: 107 mmol/L (ref 98–111)
Creatinine, Ser: 0.61 mg/dL (ref 0.44–1.00)
GFR calc Af Amer: >60 mL/min (ref >60)
GFR calc non Af Amer: >60 mL/min (ref >60)
Glucose, Bld: 115 mg/dL — ABNORMAL HIGH (ref 70–99)
Potassium: 3.6 mmol/L (ref 3.5–5.1)
Sodium: 139 mmol/L (ref 135–145)

## 2019-09-21 LAB — IRON AND TIBC
Iron: 64 ug/dL (ref 28–170)
Saturation Ratios: 16 % (ref 10.4–31.8)
TIBC: 395 ug/dL (ref 250–450)
UIBC: 331 ug/dL

## 2019-09-21 LAB — HEMOGLOBIN AND HEMATOCRIT, BLOOD
HCT: 25.6 % — ABNORMAL LOW (ref 36.0–46.0)
Hemoglobin: 8.4 g/dL — ABNORMAL LOW (ref 12.0–15.0)

## 2019-09-21 LAB — ABO/RH: ABO/RH(D): A POS

## 2019-09-21 LAB — PREPARE RBC (CROSSMATCH)

## 2019-09-21 LAB — HIV ANTIBODY (ROUTINE TESTING W REFLEX): HIV Screen 4th Generation wRfx: NONREACTIVE

## 2019-09-21 LAB — FERRITIN: Ferritin: 12 ng/mL (ref 11–307)

## 2019-09-21 MED ORDER — ACETAMINOPHEN 650 MG RE SUPP: 650.0000 mg | Freq: Four times a day (QID) | RECTAL | Status: DC | PRN

## 2019-09-21 MED ORDER — PANTOPRAZOLE SODIUM 40 MG IV SOLR
40.0000 mg | Freq: Two times a day (BID) | INTRAVENOUS | Status: DC
  Administered 2019-09-21 – 2019-09-23 (×7): 40 mg via INTRAVENOUS
  Filled 2019-09-21 (×7): qty 40

## 2019-09-21 MED ORDER — SODIUM CHLORIDE 0.9 % IV SOLN: INTRAVENOUS | Status: DC

## 2019-09-21 MED ORDER — SODIUM CHLORIDE 0.9% IV SOLUTION: Freq: Once | INTRAVENOUS | Status: AC

## 2019-09-21 MED ORDER — ACETAMINOPHEN 325 MG PO TABS
650.0000 mg | ORAL_TABLET | Freq: Four times a day (QID) | ORAL | Status: DC | PRN
  Administered 2019-09-21 – 2019-09-23 (×3): 650 mg via ORAL
  Filled 2019-09-21 (×3): qty 2

## 2019-09-21 MED ORDER — SODIUM CHLORIDE 0.9 % IV SOLN: INTRAVENOUS | Status: AC

## 2019-09-21 MED ORDER — ONDANSETRON HCL 4 MG/2ML IJ SOLN: 4.0000 mg | Freq: Four times a day (QID) | INTRAMUSCULAR | Status: DC | PRN

## 2019-09-21 MED ORDER — ONDANSETRON HCL 4 MG PO TABS: 4.0000 mg | ORAL_TABLET | Freq: Four times a day (QID) | ORAL | Status: DC | PRN

## 2019-09-21 NOTE — H&P (Signed)
History and Physical    Sonia Sandoval [id number] DOB: 1950-07-30 DOA: 09/20/2019  PCP: Sonia Aly, FNP   Patient coming from: Home   Chief Complaint: DOE, fatigue, lightheaded  HPI: Sonia Sandoval is a 69 y.o. female with medical history significant for breast cancer status-post mastectomy and with no evidence of recurrence per patient report, chronic back pain with sciatica, now presenting to the emergency department for evaluation of fatigue, lightheadedness on standing, and exertional dyspnea.  Patient reports that her low back pain had been bothering her more than usual for the past couple weeks after she had been doing some yard work and says she had been taking Aleve more frequently for that.  She was previously using Aleve once or twice a week but has taken it daily more recently.  She denies any abdominal pain and denies nausea or vomiting but noticed very dark stool once earlier this week.  She denies any history of indigestion and does not use a PPI or H2 blocker.  For the past few days, the patient has been unable to tolerate even minimal exertion due to fatigue and exertional dyspnea.  She denies any cough, fevers, or chills.  She reports undergoing colonoscopy for screening purposes most recently in 2014 through [name] and is not aware of any abnormalities being found. She denies saddle paraesthesia, incontinence, unintentional weight-loss, or fevers.   [name] ED Course: Upon arrival to the ED, patient is found to be afebrile, saturating well on room air, and with stable blood pressure.  EKG features a sinus rhythm and chest x-ray without acute findings.  Chemistry panel notable for mild elevation in BUN to creatinine ratio and CBC notable for hemoglobin of 7.4, down from 12.7 in October.  Fecal occult blood testing is positive and COVID-19 screening test negative.  Patient was transferred to [name] for ongoing evaluation and management.  Review of Systems:  All  other systems reviewed and apart from HPI, are negative.  Past Medical History:  Diagnosis Date  . Allergies 1995   Per patient report 11/14/18  . Breast cancer ([place])   . Breast cancer, right breast ([place]) 09/2017   S/P mastectomy 12/05/2017  . Headache 1968   per patient report, started as a teenager  . 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  . Osteopenia 03/2018   Seen on DEXA Scan  . PONV (postoperative nausea and vomiting)     Past Surgical History:  Procedure Laterality Date  . BACK SURGERY  2003   Patient will occassional back pain since 2003  . BREAST BIOPSY Right 09/2017  . [place]; 1992  . DILATION AND CURETTAGE OF UTERUS    . [place] SURGERY  2003  . MASTECTOMY Left 12/05/2017   PROPHYLACTIC MASTECTOMY  . MASTECTOMY COMPLETE / SIMPLE W/ SENTINEL NODE BIOPSY Right 12/05/2017   WITH RADIOACTIVE SEED TARGETED RIGHT AXIILARY LYMPH NODE EXCISION AND RIGHT SENTINEL LYMPH NODE BIOPSY,   . MASTECTOMY WITH RADIOACTIVE SEED GUIDED EXCISION AND AXILLARY SENTINEL LYMPH NODE BIOPSY Bilateral 12/05/2017   Procedure: RIGHT SIMPLE MASTECTOMY WITH RADIOACTIVE SEED TARGETED RIGHT AXIILARY LYMPH NODE EXCISION AND RIGHT SENTINEL LYMPH NODE BIOPSY, LEFT PROPHYLACTIC MASTECTOMY;  Surgeon: [name], MD;  Location: [name];  Service: General;  Laterality: Bilateral;     reports that she has never smoked. She has never used smokeless tobacco. She reports  current alcohol use of about 14.0 standard drinks of alcohol per week. She reports that she does not use drugs.  Allergies  Allergen Reactions  . Codeine Nausea And Vomiting and Other (See Comments)    headache    Family History  Problem Relation Age of Onset  . Lung cancer Father      Prior to Admission medications   Medication Sig Start Date End Date Taking? Authorizing Provider  anastrozole (ARIMIDEX)  1 MG tablet Take 1 tablet (1 mg total) by mouth daily. 02/21/19   Magrinat, [name], MD  calcium carbonate (TUMS - DOSED IN MG ELEMENTAL CALCIUM) 500 MG chewable tablet Chew 1 tablet by mouth daily.    [provider]  Calcium Carbonate-Vit D-Min (CALCIUM 1200) 1200-1000 MG-UNIT CHEW Chew 1,200 mg by mouth daily.    [provider]  Cholecalciferol (VITAMIN D3) 25 MCG (1000 UT) CHEW Chew 25 mcg by mouth. 11/14/18 Patient reports taking 2 once daily (total daily dose of 57mcg).  Started taking at time of breast cancer diagnosis.    [provider]  Coenzyme Q10 (COQ10 PO) Take 1 tablet by mouth daily.    [provider]  ibuprofen (ADVIL,MOTRIN) 200 MG tablet Take 400 mg by mouth daily as needed for headache.    [provider]  Multiple Vitamins-Minerals (CENTRUM SILVER 50+WOMEN PO) Take 1 tablet by mouth.    [provider]  naproxen sodium (ALEVE) 220 MG tablet Take 220 mg by mouth daily as needed (back pain).    [provider]  Nutritional Supplements (JUICE PLUS FIBRE PO) Juice plus fruits x 3 chews Juice plus veggie x 3 chews Juice plus Vine x 3 chews Juice plus Omega x 2 capsule    [provider]  Pseudoephedrine-Naproxen Na (ALEVE-D SINUS & HEADACHE PO) Take 1 tablet by mouth daily as needed (sinus headaches).    [provider]  pyridOXINE (VITAMIN B-6) 100 MG tablet Take 100 mg by mouth daily.    [provider]  vitamin C (ASCORBIC ACID) 500 MG tablet Take 500 mg by mouth daily.    [provider]  Vitamin E 180 MG CAPS Take 1 tablet by mouth daily.    [provider]    Physical Exam: Vitals:   09/20/19 2030 09/20/19 2100 09/20/19 2130 09/20/19 2254  BP: 118/65 113/62 112/68 123/77  Pulse: 83 81 77 85  Resp:  15 15 20   Temp:   98 F (36.7 C) 97.9 F (36.6 C)  TempSrc:   Oral Oral  SpO2: 100% 100% 100% 100%  Weight:    65.7 kg  Height:    5\' 2"  (1.575 m)     Constitutional: NAD, calm  Eyes: PERTLA, lids and conjunctivae normal ENMT: Mucous membranes are moist. Posterior pharynx clear of any exudate or lesions.   Neck: normal, supple, no masses, no thyromegaly Respiratory:  no wheezing, no crackles. No accessory muscle use.  Cardiovascular: S1 & S2 heard, regular rate and rhythm. No extremity edema. No significant JVD. Abdomen: No distension, no tenderness, soft. Bowel sounds active.  Musculoskeletal: no clubbing / cyanosis. No joint deformity upper and lower extremities.   Skin: no significant rashes, lesions, ulcers. Warm, dry, well-perfused. Neurologic: No facial asymmetry. Sensation intact. Moving all extremities.  Psychiatric: Alert and oriented to person, place, and situation. Pleasant and cooperative.    Labs and Imaging on Admission: I have personally reviewed following labs and imaging studies  CBC: Recent Labs  Lab 09/20/19 1736  WBC 5.5  NEUTROABS 3.7  HGB 7.4*  HCT 22.1*  MCV 94.8  PLT [id number]   Basic Metabolic Panel: Recent Labs  Lab 09/20/19 1736  NA 137  K 3.8  CL 105  CO2 25  GLUCOSE 112*  BUN 24*  CREATININE 0.72  CALCIUM 8.7*   GFR: Estimated Creatinine Clearance: 60.6 mL/min (by C-G formula based on SCr of 0.72 mg/dL). Liver Function Tests: Recent Labs  Lab 09/20/19 1736  AST 37  ALT 43  ALKPHOS 51  BILITOT 0.4  PROT 5.6*  ALBUMIN 3.6   No results for input(s): LIPASE, AMYLASE in the last 168 hours. No results for input(s): AMMONIA in the last 168 hours. Coagulation Profile: No results for input(s): INR, PROTIME in the last 168 hours. Cardiac Enzymes: No results for input(s): CKTOTAL, CKMB, CKMBINDEX, TROPONINI in the last 168 hours. BNP (last 3 results) No results for input(s): PROBNP in the last 8760 hours. HbA1C: No results for input(s): HGBA1C in the last 72 hours. CBG: No results for input(s): GLUCAP in the last 168 hours. Lipid Profile: No results for input(s): CHOL, HDL, LDLCALC,  TRIG, CHOLHDL, LDLDIRECT in the last 72 hours. Thyroid Function Tests: No results for input(s): TSH, T4TOTAL, FREET4, T3FREE, THYROIDAB in the last 72 hours. Anemia Panel: No results for input(s): VITAMINB12, FOLATE, FERRITIN, TIBC, IRON, RETICCTPCT in the last 72 hours. Urine analysis:    Component Value Date/Time   COLORURINE YELLOW 09/20/2019 1945   APPEARANCEUR CLEAR 09/20/2019 1945   LABSPEC <1.005 (L) 09/20/2019 1945   PHURINE 5.5 09/20/2019 1945   GLUCOSEU NEGATIVE 09/20/2019 1945   HGBUR NEGATIVE 09/20/2019 1945   BILIRUBINUR NEGATIVE 09/20/2019 [place] NEGATIVE 09/20/2019 1945   PROTEINUR NEGATIVE 09/20/2019 1945   NITRITE NEGATIVE 09/20/2019 1945   LEUKOCYTESUR SMALL (A) 09/20/2019 1945   Sepsis Labs: @LABRCNTIP (procalcitonin:4,lacticidven:4) ) Recent Results (from the past 240 hour(s))  SARS Coronavirus 2 by RT PCR (hospital order, performed in [facility] hospital lab) Nasopharyngeal Nasopharyngeal Swab     Status: None   Collection Time: 09/20/19  9:25 PM   Specimen: Nasopharyngeal Swab  Result Value Ref Range Status   SARS Coronavirus 2 NEGATIVE NEGATIVE Final    Comment: (NOTE) SARS-CoV-2 target nucleic acids are NOT DETECTED. The SARS-CoV-2 RNA is generally detectable in upper and lower respiratory specimens during the acute phase of infection. The lowest concentration of SARS-CoV-2 viral copies this assay can detect is 250 copies / mL. A negative result does not preclude SARS-CoV-2 infection and should not be used as the sole basis for treatment or other patient management decisions.  A negative result may occur with improper specimen collection / handling, submission of specimen other than nasopharyngeal swab, presence of viral mutation(s) within the areas targeted by this assay, and inadequate number of viral copies (<250 copies / mL). A negative result must be combined with clinical observations, patient history, and epidemiological  information. Fact Sheet for Patients:   StrictlyIdeas.no Fact Sheet for Healthcare Providers: BankingDealers.co.za This test is not yet approved or cleared  by the [place] FDA and has been authorized for detection and/or diagnosis of SARS-CoV-2 by FDA under an Emergency Use Authorization (EUA).  This EUA will remain in effect (meaning this test can be used) for the duration of the COVID-19 declaration under Section 564(b)(1) of the Act, 21 U.S.C. section 360bbb-3(b)(1), unless the authorization is terminated or revoked sooner. Performed at [facility], [address], [place], [place] [address]  Radiological Exams on Admission: DG Chest 2 View  Result Date: 09/20/2019 CLINICAL DATA:  Shortness of breath on exertion EXAM: CHEST - 2 VIEW COMPARISON:  None. FINDINGS: Cardiac shadow is within normal limits. The lungs are hyperinflated consistent with COPD. Bilateral breast surgery is noted. The lungs are clear. Mild apical scarring is noted. No bony abnormality is seen. IMPRESSION: COPD without acute abnormality. Electronically Signed   By: Inez Catalina M.D.   On: 09/20/2019 18:04    EKG: Independently reviewed. Sinus rhythm.   Assessment/Plan   1. Symptomatic anemia; GI bleeding  - Presents with fatigue, lightheadedness on standing, and DOE, and is found to have Hgb of 7.4, down from 12.7 in October 2020  - She denies abdominal pain or N/V, reports one episode of melena a few days ago, has been using NSAID more frequently for back pain recently, and is FOBT positive  - Plan to transfuse one unit now due to her symptoms, start Protonix 40 mg IV q12h, check post-transfusion CBC, avoid NSAIDs      DVT prophylaxis: SCDs  Code Status: Full  Family Communication: Discussed with patient  Disposition Plan:  Patient is from: Home  Anticipated d/c is to: Home  Anticipated d/c date is: 09/22/19 Patient currently: Pending  blood transfusion  Consults called: None  Admission status: Inpatient     Vianne Bulls, MD [facility] Pager: See [email or web address]  If 7AM-7PM, please contact the daytime attending [email or web address]  09/21/2019, 12:38 AM
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
## 2019-09-21 NOTE — Progress Notes (Signed)
Dr Myna Hidalgo at bedside now
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
## 2019-09-21 NOTE — Progress Notes (Signed)
PROGRESS NOTE    Sonia Sandoval  [id number] DOB: 1950/12/12 DOA: 09/20/2019 PCP: [name], FNP   Brief Narrative: Patient is a 69 year old female with history of breast cancer status post bilateral mastectomy, lymph node dissection, chronic back pain who presents to the emergency department for the evaluation of fatigue, lightheadedness and exertional dyspnea. Patient is usually very physically active. Since last couple of weeks, she has been feeling weak while working on her yard. 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 She noticed some black tarry stools recently and also some blood once or twice in her stool. She had undergone colonoscopy about 5 to 10 years ago at [facility] and it was normal.On presentation, she was hemodynamically stable. Hemoglobin was 7.4. Her hemoglobin was 12.7 in October. FOBT was positive. She was transfused with a unit of PRBC. This morning hemoglobin in the range of 8. GI has been consulted. Started on Protonix IV. Plan for EGD/colonoscopy on Monday.  Assessment & Plan:   Principal Problem:   Symptomatic anemia Active Problems:   GI bleed   Symptomatic anemia: Baseline hemoglobin ranges around 12. Presented with dyspnea on exertion, fatigue, lightheadedness. Hemoglobin 7.4 on presentation. FOBT positive. Transfused with a unit of PRBC. Hemoglobin range of 8. We will continue to monitor and transfuse if necessary. We will check iron studies.  Upper versus lower GI bleed: FOBT positive. She had colonoscopy about 5 to 10 years ago at [facility] which was normal. GI following. Started on Protonix IV twice a day. GI planning for EGD/colonoscopy on Monday. She reports taking Aleve at home for arthritis.         DVT prophylaxis:SCD Code Status: Full Family Communication: None present at the bedside. Patient fully understands the plan. Status is: Inpatient  Remains inpatient appropriate because:Ongoing diagnostic testing needed not  appropriate for outpatient work up   Dispo: The patient is from: Home              Anticipated d/c is to: Home              Anticipated d/c date is: 2 days              Patient currently is not medically stable to d/c.    Consultants: GI  Procedures:None  Antimicrobials:  Anti-infectives (From admission, onward)   None      Subjective: Patient seen and examined the bedside this morning. Hemodynamically stable. Very comfortable during my evaluation. Denies any abdominal pain, nausea or vomiting. Does not have any active GI bleed at present.  Objective: Vitals:   09/21/19 0352 09/21/19 0422 09/21/19 0632 09/21/19 1113  BP: (!) 118/55 112/60 122/69 (!) 98/52  Pulse: 72 76 74 83  Resp: (!) 22 18 16 18   Temp: 97.9 F (36.6 C) 98 F (36.7 C) 98.1 F (36.7 C) 98.1 F (36.7 C)  TempSrc: Oral Oral Oral Oral  SpO2: 100% 98% 99% 99%  Weight:      Height:        Intake/Output Summary (Last 24 hours) at 09/21/2019 1338 Last data filed at 09/21/2019 [id number] Gross per 24 hour  Intake 581.25 ml  Output 250 ml  Net 331.25 ml   Filed Weights   09/20/19 1714 09/20/19 2254  Weight: 63.5 kg 65.7 kg    Examination:  General exam: Appears calm and comfortable ,Not in distress,average built, very pleasant female HEENT:PERRL,Oral mucosa moist, Ear/Nose normal on gross exam Respiratory system: Bilateral equal air entry, normal vesicular  breath sounds, no wheezes or crackles  Cardiovascular system: S1 & S2 heard, RRR. No JVD, murmurs, rubs, gallops or clicks. No pedal edema. Gastrointestinal system: Abdomen is nondistended, soft and nontender. No organomegaly or masses felt. Normal bowel sounds heard. Central nervous system: Alert and oriented. No focal neurological deficits. Extremities: No edema, no clubbing ,no cyanosis, distal peripheral pulses palpable. Skin: No rashes, lesions or ulcers,no icterus ,no pallor   Data Reviewed: I have personally reviewed following labs and imaging  studies  CBC: Recent Labs  Lab 09/20/19 1736 09/21/19 0716  WBC 5.5  --   NEUTROABS 3.7  --   HGB 7.4* 8.4*  HCT 22.1* 25.6*  MCV 94.8  --   PLT 227  --    Basic Metabolic Panel: Recent Labs  Lab 09/20/19 1736 09/21/19 0100  NA 137 139  K 3.8 3.6  CL 105 107  CO2 25 25  GLUCOSE 112* 115*  BUN 24* 24*  CREATININE 0.72 0.61  CALCIUM 8.7* 8.4*   GFR: Estimated Creatinine Clearance: 60.6 mL/min (by C-G formula based on SCr of 0.61 mg/dL). Liver Function Tests: Recent Labs  Lab 09/20/19 1736  AST 37  ALT 43  ALKPHOS 51  BILITOT 0.4  PROT 5.6*  ALBUMIN 3.6   No results for input(s): LIPASE, AMYLASE in the last 168 hours. No results for input(s): AMMONIA in the last 168 hours. Coagulation Profile: No results for input(s): INR, PROTIME in the last 168 hours. Cardiac Enzymes: No results for input(s): CKTOTAL, CKMB, CKMBINDEX, TROPONINI in the last 168 hours. BNP (last 3 results) No results for input(s): PROBNP in the last 8760 hours. HbA1C: No results for input(s): HGBA1C in the last 72 hours. CBG: No results for input(s): GLUCAP in the last 168 hours. Lipid Profile: No results for input(s): CHOL, HDL, LDLCALC, TRIG, CHOLHDL, LDLDIRECT in the last 72 hours. Thyroid Function Tests: No results for input(s): TSH, T4TOTAL, FREET4, T3FREE, THYROIDAB in the last 72 hours. Anemia Panel: No results for input(s): VITAMINB12, FOLATE, FERRITIN, TIBC, IRON, RETICCTPCT in the last 72 hours. Sepsis Labs: No results for input(s): PROCALCITON, LATICACIDVEN in the last 168 hours.  Recent Results (from the past 240 hour(s))  SARS Coronavirus 2 by RT PCR (hospital order, performed in [facility] hospital lab) Nasopharyngeal Nasopharyngeal Swab     Status: None   Collection Time: 09/20/19  9:25 PM   Specimen: Nasopharyngeal Swab  Result Value Ref Range Status   SARS Coronavirus 2 NEGATIVE NEGATIVE Final    Comment: (NOTE) SARS-CoV-2 target nucleic acids are NOT DETECTED. The  SARS-CoV-2 RNA is generally detectable in upper and lower respiratory specimens during the acute phase of infection. The lowest concentration of SARS-CoV-2 viral copies this assay can detect is 250 copies / mL. A negative result does not preclude SARS-CoV-2 infection and should not be used as the sole basis for treatment or other patient management decisions.  A negative result may occur with improper specimen collection / handling, submission of specimen other than nasopharyngeal swab, presence of viral mutation(s) within the areas targeted by this assay, and inadequate number of viral copies (<250 copies / mL). A negative result must be combined with clinical observations, patient history, and epidemiological information. Fact Sheet for Patients:   StrictlyIdeas.no Fact Sheet for Healthcare Providers: BankingDealers.co.za This test is not yet approved or cleared  by the [place] FDA and has been authorized for detection and/or diagnosis of SARS-CoV-2 by FDA under an Emergency Use Authorization (EUA).  This EUA will  remain in effect (meaning this test can be used) for the duration of the COVID-19 declaration under Section 564(b)(1) of the Act, 21 U.S.C. section 360bbb-3(b)(1), unless the authorization is terminated or revoked sooner. Performed at [facility], [address], [name], [place] [address]          Radiology Studies: DG Chest 2 View  Result Date: 09/20/2019 CLINICAL DATA:  Shortness of breath on exertion EXAM: CHEST - 2 VIEW COMPARISON:  None. FINDINGS: Cardiac shadow is within normal limits. The lungs are hyperinflated consistent with COPD. Bilateral breast surgery is noted. The lungs are clear. Mild apical scarring is noted. No bony abnormality is seen. IMPRESSION: COPD without acute abnormality. Electronically Signed   By: Inez Catalina M.D.   On: 09/20/2019 18:04        Scheduled Meds: .  pantoprazole (PROTONIX) IV  40 mg Intravenous Q12H   Continuous Infusions: . sodium chloride       LOS: 1 day    Time spent: More than 50% of that time was spent in counseling and/or coordination of care.      Shelly Coss, MD [facility] P5/22/2021, 1:38 PM

## 2019-09-22 DIAGNOSIS — K922 Gastrointestinal hemorrhage, unspecified: Secondary | ICD-10-CM | POA: Diagnosis not present

## 2019-09-22 DIAGNOSIS — D649 Anemia, unspecified: Secondary | ICD-10-CM | POA: Diagnosis not present

## 2019-09-22 DIAGNOSIS — D500 Iron deficiency anemia secondary to blood loss (chronic): Secondary | ICD-10-CM | POA: Diagnosis not present

## 2019-09-22 LAB — CBC WITH DIFFERENTIAL/PLATELET
Abs Immature Granulocytes: 0.02 10*3/uL (ref 0.00–0.07)
Basophils Absolute: 0 10*3/uL (ref 0.0–0.1)
Basophils Relative: 0 %
Eosinophils Absolute: 0.2 10*3/uL (ref 0.0–0.5)
Eosinophils Relative: 5 %
HCT: 24.4 % — ABNORMAL LOW (ref 36.0–46.0)
Hemoglobin: 7.9 g/dL — ABNORMAL LOW (ref 12.0–15.0)
Immature Granulocytes: 1 %
Lymphocytes Relative: 29 %
Lymphs Abs: 1 10*3/uL (ref 0.7–4.0)
MCH: 31.1 pg (ref 26.0–34.0)
MCHC: 32.4 g/dL (ref 30.0–36.0)
MCV: 96.1 fL (ref 80.0–100.0)
Monocytes Absolute: 0.3 10*3/uL (ref 0.1–1.0)
Monocytes Relative: 8 %
Neutro Abs: 2.1 10*3/uL (ref 1.7–7.7)
Neutrophils Relative %: 57 %
Platelets: 228 10*3/uL (ref 150–400)
RBC: 2.54 MIL/uL — ABNORMAL LOW (ref 3.87–5.11)
RDW: 15.3 % (ref 11.5–15.5)
WBC: 3.6 10*3/uL — ABNORMAL LOW (ref 4.0–10.5)
nRBC: 0 % (ref 0.0–0.2)

## 2019-09-22 LAB — PREPARE RBC (CROSSMATCH)

## 2019-09-22 MED ORDER — ACETAMINOPHEN 325 MG PO TABS
650.0000 mg | ORAL_TABLET | Freq: Once | ORAL | Status: AC
  Administered 2019-09-22: 650 mg via ORAL
  Filled 2019-09-22: qty 2

## 2019-09-22 MED ORDER — PEG 3350-KCL-NA BICARB-NACL 420 G PO SOLR
4000.0000 mL | Freq: Once | ORAL | Status: AC
  Administered 2019-09-22: 4000 mL via ORAL

## 2019-09-22 MED ORDER — DIPHENHYDRAMINE HCL 25 MG PO CAPS
25.0000 mg | ORAL_CAPSULE | Freq: Once | ORAL | Status: AC
  Administered 2019-09-22: 25 mg via ORAL
  Filled 2019-09-22: qty 1

## 2019-09-22 MED ORDER — SODIUM CHLORIDE 0.9% IV SOLUTION: Freq: Once | INTRAVENOUS | Status: AC

## 2019-09-22 NOTE — Progress Notes (Signed)
Patient ID: Sonia Sandoval, female   DOB: 02-Apr-1951, 69 y.o.   MRN: [id number]  PROGRESS NOTE    GENOWEFA ELKHATIB  [id number] DOB: 06-18-1950 DOA: 09/20/2019 PCP: [name], FNP    Brief Narrative:  Patient is a 69 year old female with history of breast cancer status post bilateral mastectomy, lymph node dissection, chronic back pain who presents to the emergency department for the evaluation of fatigue, lightheadedness and exertional dyspnea. Patient is usually very physically active. Since last couple of weeks, she has been feeling weak while working on her yard. 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 She noticed some black tarry stools recently and also some blood once or twice in her stool. She had undergone colonoscopy about 5 to 10 years ago at [facility] and it was normal.On presentation, she was hemodynamically stable. Hemoglobin was 7.4. Her hemoglobin was 12.7 in October. FOBT was positive. She was transfused with a unit of PRBC. This morning hemoglobin in the range of 8.4 with on-going GI losses will give another unit of PRBCs today. GI has been consulted. Started on Protonix IV. Plan for EGD/colonoscopy on Monday.  Assessment & Plan:   Principal Problem:   Symptomatic anemia Active Problems:   GI bleed  Symptomatic anemia: Baseline hemoglobin ranges around 12. Presented with dyspnea on exertion, fatigue, lightheadedness. Hemoglobin 7.4 on presentation. FOBT positive. Transfused with a unit of PRBC. Hemoglobin range of 8. We will continue to monitor and transfuse if necessary. We will check iron studies. Continues to have symptoms, and dark stools. A second unit of PRBC's given. CBC in am.  Upper versus lower GI bleed: FOBT positive. She had colonoscopy about 5 to 10 years ago at [facility] which was normal. GI following. Started on Protonix IV twice a day. GI planning for EGD/colonoscopy on Monday. She reports taking Aleve at home for arthritis.   DVT  prophylaxis: SCD/Compression stockings and GI bleed Code Status: Full code  Family Communication: Husband at bedside Disposition Plan: home - would like to go as early as tomorrow if possible. Remains inpatient appropriate because:Ongoing diagnostic testing needed not appropriate for outpatient work up  Consultants:   GI  Procedures:  none  Antimicrobials: Anti-infectives (From admission, onward)   None      Subjective: Felt better yesterday after her 1st unit of blood. Hgb up to 8.4 yesterday. Today back down to 7.9 and she is quite dizzy and lightheaded when up to the bathroom.  Objective: Vitals:   09/21/19 1113 09/21/19 1501 09/21/19 2131 09/22/19 0525  BP: (!) 98/52 124/76 124/77 116/66  Pulse: 83 77 74 79  Resp: 18 20 18 18   Temp: 98.1 F (36.7 C) (!) 97.5 F (36.4 C) 97.9 F (36.6 C) 98.2 F (36.8 C)  TempSrc: Oral Oral Oral Oral  SpO2: 99% 100% 96% 99%  Weight:      Height:        Intake/Output Summary (Last 24 hours) at 09/22/2019 1033 Last data filed at 09/21/2019 1500 Gross per 24 hour  Intake 74.64 ml  Output -  Net 74.64 ml   Filed Weights   09/20/19 1714 09/20/19 2254  Weight: 63.5 kg 65.7 kg    Examination:  General exam: Appears calm and comfortable  Respiratory system: Clear to auscultation. Respiratory effort normal. Cardiovascular system: S1 & S2 heard, RRR.  Gastrointestinal system: Abdomen is nondistended, soft and nontender.  Central nervous system: Alert and oriented. No focal neurological deficits. Extremities: Symmetric  Skin: No  rashes Psychiatry: Judgement and insight appear normal. Mood & affect appropriate.     Data Reviewed: I have personally reviewed following labs and imaging studies  CBC: Recent Labs  Lab 09/20/19 1736 09/21/19 0716 09/22/19 0537  WBC 5.5  --  3.6*  NEUTROABS 3.7  --  2.1  HGB 7.4* 8.4* 7.9*  HCT 22.1* 25.6* 24.4*  MCV 94.8  --  96.1  PLT 227  --  [id number]   Basic Metabolic Panel: Recent Labs   Lab 09/20/19 1736 09/21/19 0100  NA 137 139  K 3.8 3.6  CL 105 107  CO2 25 25  GLUCOSE 112* 115*  BUN 24* 24*  CREATININE 0.72 0.61  CALCIUM 8.7* 8.4*   GFR: Estimated Creatinine Clearance: 60.6 mL/min (by C-G formula based on SCr of 0.61 mg/dL). Liver Function Tests: Recent Labs  Lab 09/20/19 1736  AST 37  ALT 43  ALKPHOS 51  BILITOT 0.4  PROT 5.6*  ALBUMIN 3.6   Anemia Panel: Recent Labs    09/21/19 1430  FERRITIN 12  TIBC 395  IRON 64   Sepsis Labs:  Recent Results (from the past 240 hour(s))  SARS Coronavirus 2 by RT PCR (hospital order, performed in [facility] hospital lab) Nasopharyngeal Nasopharyngeal Swab     Status: None   Collection Time: 09/20/19  9:25 PM   Specimen: Nasopharyngeal Swab  Result Value Ref Range Status   SARS Coronavirus 2 NEGATIVE NEGATIVE Final    Comment: (NOTE) SARS-CoV-2 target nucleic acids are NOT DETECTED. The SARS-CoV-2 RNA is generally detectable in upper and lower respiratory specimens during the acute phase of infection. The lowest concentration of SARS-CoV-2 viral copies this assay can detect is 250 copies / mL. A negative result does not preclude SARS-CoV-2 infection and should not be used as the sole basis for treatment or other patient management decisions.  A negative result may occur with improper specimen collection / handling, submission of specimen other than nasopharyngeal swab, presence of viral mutation(s) within the areas targeted by this assay, and inadequate number of viral copies (<250 copies / mL). A negative result must be combined with clinical observations, patient history, and epidemiological information. Fact Sheet for Patients:   StrictlyIdeas.no Fact Sheet for Healthcare Providers: BankingDealers.co.za This test is not yet approved or cleared  by the [place] FDA and has been authorized for detection and/or diagnosis of SARS-CoV-2 by FDA under  an Emergency Use Authorization (EUA).  This EUA will remain in effect (meaning this test can be used) for the duration of the COVID-19 declaration under Section 564(b)(1) of the Act, 21 U.S.C. section 360bbb-3(b)(1), unless the authorization is terminated or revoked sooner. Performed at [facility], [address], [place], [place] [address]       Radiology Studies: DG Chest 2 View  Result Date: 09/20/2019 CLINICAL DATA:  Shortness of breath on exertion EXAM: CHEST - 2 VIEW COMPARISON:  None. FINDINGS: Cardiac shadow is within normal limits. The lungs are hyperinflated consistent with COPD. Bilateral breast surgery is noted. The lungs are clear. Mild apical scarring is noted. No bony abnormality is seen. IMPRESSION: COPD without acute abnormality. Electronically Signed   By: Inez Catalina M.D.   On: 09/20/2019 18:04     Scheduled Meds: . sodium chloride   Intravenous Once  . acetaminophen  650 mg Oral Once  . diphenhydrAMINE  25 mg Oral Once  . pantoprazole (PROTONIX) IV  40 mg Intravenous Q12H  . polyethylene glycol-electrolytes  4,000  mL Oral Once   Continuous Infusions: . sodium chloride 75 mL/hr at 09/22/19 0247     LOS: 2 days    Donnamae Jude, MD 09/22/2019 10:33 AM [phone] [facility] If 7PM-7AM, please contact night-coverage 09/22/2019, 10:33 AM

## 2019-09-23 ENCOUNTER — Encounter (HOSPITAL_COMMUNITY)
Admission: EM | Disposition: A | Discharge status: Home / Self Care | Payer: Self-pay | Source: Home / Self Care | Attending: Emergency Medicine

## 2019-09-23 ENCOUNTER — Encounter (HOSPITAL_COMMUNITY): Payer: Self-pay | Admitting: Family Medicine

## 2019-09-23 ENCOUNTER — Inpatient Hospital Stay (HOSPITAL_COMMUNITY): Payer: BC Managed Care – PPO

## 2019-09-23 ENCOUNTER — Inpatient Hospital Stay (HOSPITAL_COMMUNITY): Payer: BC Managed Care – PPO | Admitting: Anesthesiology

## 2019-09-23 DIAGNOSIS — D500 Iron deficiency anemia secondary to blood loss (chronic): Secondary | ICD-10-CM | POA: Diagnosis not present

## 2019-09-23 DIAGNOSIS — D649 Anemia, unspecified: Secondary | ICD-10-CM | POA: Diagnosis not present

## 2019-09-23 HISTORY — PX: ESOPHAGOGASTRODUODENOSCOPY: SHX5428

## 2019-09-23 HISTORY — PX: SUBMUCOSAL TATTOO INJECTION: SHX6856

## 2019-09-23 HISTORY — PX: COLONOSCOPY WITH PROPOFOL: SHX5780

## 2019-09-23 HISTORY — PX: BIOPSY: SHX5522

## 2019-09-23 LAB — TYPE AND SCREEN
ABO/RH(D): A POS
Antibody Screen: NEGATIVE
Unit division: 0
Unit division: 0

## 2019-09-23 LAB — BPAM RBC
Blood Product Expiration Date: 202106042359
Blood Product Expiration Date: 202106042359
ISSUE DATE / TIME: 202105220353
ISSUE DATE / TIME: 202105231220
Unit Type and Rh: 6200
Unit Type and Rh: 6200

## 2019-09-23 LAB — CBC WITH DIFFERENTIAL/PLATELET
Abs Immature Granulocytes: 0.01 10*3/uL (ref 0.00–0.07)
Basophils Absolute: 0 10*3/uL (ref 0.0–0.1)
Basophils Relative: 1 %
Eosinophils Absolute: 0.1 10*3/uL (ref 0.0–0.5)
Eosinophils Relative: 3 %
HCT: 29 % — ABNORMAL LOW (ref 36.0–46.0)
Hemoglobin: 9.4 g/dL — ABNORMAL LOW (ref 12.0–15.0)
Immature Granulocytes: 0 %
Lymphocytes Relative: 21 %
Lymphs Abs: 0.8 10*3/uL (ref 0.7–4.0)
MCH: 30.4 pg (ref 26.0–34.0)
MCHC: 32.4 g/dL (ref 30.0–36.0)
MCV: 93.9 fL (ref 80.0–100.0)
Monocytes Absolute: 0.4 10*3/uL (ref 0.1–1.0)
Monocytes Relative: 10 %
Neutro Abs: 2.7 10*3/uL (ref 1.7–7.7)
Neutrophils Relative %: 65 %
Platelets: 224 10*3/uL (ref 150–400)
RBC: 3.09 MIL/uL — ABNORMAL LOW (ref 3.87–5.11)
RDW: 16.7 % — ABNORMAL HIGH (ref 11.5–15.5)
WBC: 4 10*3/uL (ref 4.0–10.5)
nRBC: 0 % (ref 0.0–0.2)

## 2019-09-23 LAB — BASIC METABOLIC PANEL
Anion gap: 9 (ref 5–15)
BUN: 13 mg/dL (ref 8–23)
CO2: 23 mmol/L (ref 22–32)
Calcium: 8.3 mg/dL — ABNORMAL LOW (ref 8.9–10.3)
Chloride: 109 mmol/L (ref 98–111)
Creatinine, Ser: 0.65 mg/dL (ref 0.44–1.00)
GFR calc Af Amer: >60 mL/min (ref >60)
GFR calc non Af Amer: >60 mL/min (ref >60)
Glucose, Bld: 108 mg/dL — ABNORMAL HIGH (ref 70–99)
Potassium: 4 mmol/L (ref 3.5–5.1)
Sodium: 141 mmol/L (ref 135–145)

## 2019-09-23 SURGERY — COLONOSCOPY WITH PROPOFOL
Anesthesia: Monitor Anesthesia Care

## 2019-09-23 MED ORDER — ASCORBIC ACID 500 MG PO TABS
500.0000 mg | ORAL_TABLET | Freq: Every day | ORAL | Status: DC
  Administered 2019-09-23 – 2019-09-24 (×2): 500 mg via ORAL
  Filled 2019-09-23 (×2): qty 1

## 2019-09-23 MED ORDER — VITAMIN E 180 MG (400 UNIT) PO CAPS
400.0000 [IU] | ORAL_CAPSULE | Freq: Every day | ORAL | Status: DC
  Administered 2019-09-24: 400 [IU] via ORAL
  Filled 2019-09-23: qty 1

## 2019-09-23 MED ORDER — PROPOFOL 10 MG/ML IV BOLUS
INTRAVENOUS | Status: DC | PRN
  Administered 2019-09-23 (×2): 20 mg via INTRAVENOUS
  Administered 2019-09-23: 30 mg via INTRAVENOUS

## 2019-09-23 MED ORDER — VITAMIN E 180 MG PO CAPS: ORAL_CAPSULE | Freq: Every day | ORAL | Status: DC

## 2019-09-23 MED ORDER — ACETAMINOPHEN 325 MG PO TABS
650.0000 mg | ORAL_TABLET | ORAL | Status: DC | PRN
  Administered 2019-09-23 – 2019-09-24 (×2): 650 mg via ORAL
  Filled 2019-09-23 (×2): qty 2

## 2019-09-23 MED ORDER — SODIUM CHLORIDE 0.9 % IV SOLN: INTRAVENOUS | Status: DC

## 2019-09-23 MED ORDER — CALCIUM CARBONATE-VITAMIN D 500-200 MG-UNIT PO TABS
1.0000 | ORAL_TABLET | Freq: Every day | ORAL | Status: DC
  Administered 2019-09-24: 1 via ORAL
  Filled 2019-09-23: qty 1

## 2019-09-23 MED ORDER — PROPOFOL 500 MG/50ML IV EMUL
INTRAVENOUS | Status: DC | PRN
  Administered 2019-09-23: 150 ug/kg/min via INTRAVENOUS

## 2019-09-23 MED ORDER — ANASTROZOLE 1 MG PO TABS
1.0000 mg | ORAL_TABLET | Freq: Every day | ORAL | Status: DC
  Administered 2019-09-23 – 2019-09-24 (×2): 1 mg via ORAL
  Filled 2019-09-23 (×2): qty 1

## 2019-09-23 MED ORDER — IOHEXOL 300 MG/ML  SOLN
100.0000 mL | Freq: Once | INTRAMUSCULAR | Status: AC | PRN
  Administered 2019-09-23: 100 mL via INTRAVENOUS

## 2019-09-23 MED ORDER — VITAMIN B-6 100 MG PO TABS
100.0000 mg | ORAL_TABLET | Freq: Every day | ORAL | Status: DC
  Administered 2019-09-23 – 2019-09-24 (×2): 100 mg via ORAL
  Filled 2019-09-23 (×2): qty 1

## 2019-09-23 MED ORDER — SPOT INK MARKER SYRINGE KIT
PACK | SUBMUCOSAL | Status: AC
  Filled 2019-09-23: qty 5

## 2019-09-23 MED ORDER — PROPOFOL 10 MG/ML IV BOLUS
INTRAVENOUS | Status: AC
  Filled 2019-09-23: qty 20

## 2019-09-23 MED ORDER — SODIUM CHLORIDE (PF) 0.9 % IJ SOLN
INTRAMUSCULAR | Status: AC
  Filled 2019-09-23: qty 50

## 2019-09-23 MED ORDER — LACTATED RINGERS IV SOLN: INTRAVENOUS | Status: DC

## 2019-09-23 MED ORDER — SPOT INK MARKER SYRINGE KIT
PACK | SUBMUCOSAL | Status: DC | PRN
  Administered 2019-09-23: 1 mL via SUBMUCOSAL

## 2019-09-23 MED ORDER — ACETAMINOPHEN 650 MG RE SUPP: 650.0000 mg | Freq: Four times a day (QID) | RECTAL | Status: DC | PRN

## 2019-09-23 MED ORDER — ONDANSETRON HCL 4 MG/2ML IJ SOLN
INTRAMUSCULAR | Status: DC | PRN
  Administered 2019-09-23: 4 mg via INTRAVENOUS

## 2019-09-23 MED ORDER — IOHEXOL 9 MG/ML PO SOLN
500.0000 mL | ORAL | Status: AC
  Administered 2019-09-23 (×2): 500 mL via ORAL

## 2019-09-23 MED ORDER — IOHEXOL 9 MG/ML PO SOLN
ORAL | Status: AC
  Filled 2019-09-23: qty 1000

## 2019-09-23 MED ORDER — CALCIUM 1200 1200-1000 MG-UNIT PO CHEW: CHEWABLE_TABLET | Freq: Every day | ORAL | Status: DC

## 2019-09-23 SURGICAL SUPPLY — 22 items

## 2019-09-23 NOTE — Consult Note (Addendum)
[facility] Surgery Consult Note  Sonia Sandoval 21-Oct-1950  [id number].    Requesting MD: Dr. Alessandra Bevels Chief Complaint/Reason for Consult: rectosigmoid colon mass HPI:  Patient is a 69 year old female who presented to [facility] with fatigue, lightheadedness on standing, and exertional dyspnea. She reported that her low back had been bothering her more and she had been taking Aleve more frequently for this. Denied abdominal pain, nausea, vomiting. Did have a dark stool earlier in the week. Was not taking any PPIs or H2 blockers. Patient denied cough, fever, chills, unintentional weight loss. Her last colonoscopy was in 2014 at [facility] and she thought it was normal. Patient has been found to be anemic, FOBT was positive. 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   PMH otherwise significant for breast cancer s/p bilateral mastectomy in 2019. Intolerance listed to codeine. Past abdominal surgery includes cesarean section x2. Patient is retired and lives with her husband. She denies illicit drug use or tobacco use. She reports 1-2 glasses wine daily.   ROS: Review of Systems  Constitutional: Negative for chills, fever and weight loss.  Respiratory: Negative for cough, hemoptysis and wheezing.        Dyspnea on exertion   Cardiovascular: Negative for chest pain.  Gastrointestinal: Positive for melena. Negative for abdominal pain, nausea and vomiting.  Genitourinary: Negative for dysuria, frequency and urgency.  Musculoskeletal: Positive for back pain (chronic ).  Neurological: Positive for dizziness and weakness.  All other systems reviewed and are negative.   Family History  Problem Relation Age of Onset  . Lung cancer Father     Past Medical History:  Diagnosis Date  . Allergies 1995   Per patient report 11/14/18  . Breast cancer ([place])   . Breast cancer, right breast ([place])  09/2017   S/P mastectomy 12/05/2017  . Headache 1968   per patient report, started as a teenager  . 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  . Osteopenia 03/2018   Seen on DEXA Scan  . PONV (postoperative nausea and vomiting)     Past Surgical History:  Procedure Laterality Date  . BACK SURGERY  2003   Patient will occassional back pain since 2003  . BREAST BIOPSY Right 09/2017  . [place]; 1992  . DILATION AND CURETTAGE OF UTERUS    . [place] SURGERY  2003  . MASTECTOMY Left 12/05/2017   PROPHYLACTIC MASTECTOMY  . MASTECTOMY COMPLETE / SIMPLE W/ SENTINEL NODE BIOPSY Right 12/05/2017   WITH RADIOACTIVE SEED TARGETED RIGHT AXIILARY LYMPH NODE EXCISION AND RIGHT SENTINEL LYMPH NODE BIOPSY,   . MASTECTOMY WITH RADIOACTIVE SEED GUIDED EXCISION AND AXILLARY SENTINEL LYMPH NODE BIOPSY Bilateral 12/05/2017   Procedure: RIGHT SIMPLE MASTECTOMY WITH RADIOACTIVE SEED TARGETED RIGHT AXIILARY LYMPH NODE EXCISION AND RIGHT SENTINEL LYMPH NODE BIOPSY, LEFT PROPHYLACTIC MASTECTOMY;  Surgeon: [name], MD;  Location: [facility];  Service: General;  Laterality: Bilateral;    Social History:  reports that she has never smoked. She has never used smokeless tobacco. She reports current alcohol use of about 14.0 standard drinks of alcohol per week. She reports that she does not use drugs.  Allergies:  Allergies  Allergen Reactions  . Codeine Nausea And Vomiting and Other (See Comments)    headache  Medications Prior to Admission  Medication Sig Dispense Refill  . anastrozole (ARIMIDEX) 1 MG tablet Take 1 tablet (1 mg total) by mouth daily. 90 tablet 4  . Calcium Carbonate-Vit D-Min (CALCIUM 1200 PO) Take 1 tablet by mouth daily.    . Cholecalciferol (VITAMIN D3) 25 MCG (1000 UT) CHEW Chew 50 mcg by mouth daily.     . Coenzyme Q10 (COQ10 PO) Take 1 tablet by mouth daily.     Marland Kitchen ibuprofen (ADVIL,MOTRIN) 200 MG tablet Take 400 mg by mouth daily as needed for headache.    . Multiple Vitamins-Minerals (CENTRUM SILVER 50+WOMEN PO) Take 1 tablet by mouth.    . naproxen sodium (ALEVE) 220 MG tablet Take 220 mg by mouth daily as needed (back pain).    . Pseudoephedrine-Naproxen Na (ALEVE-D SINUS & HEADACHE PO) Take 1 tablet by mouth daily as needed (sinus headaches).    . pyridOXINE (VITAMIN B-6) 100 MG tablet Take 100 mg by mouth daily.    Marland Kitchen tiZANidine (ZANAFLEX) 4 MG tablet Take 4 mg by mouth at bedtime.    . vitamin C (ASCORBIC ACID) 500 MG tablet Take 500 mg by mouth daily.    . Vitamin E 180 MG CAPS Take 1 tablet by mouth daily.      Blood pressure 112/76, pulse 74, temperature 97.8 F (36.6 C), temperature source Oral, resp. rate 14, height 5' 2.5" (1.588 m), weight 63.5 kg, SpO2 98 %. Physical Exam:  General: pleasant, WD, WN white female who is laying in bed in NAD HEENT: head is normocephalic, atraumatic.  Sclera are noninjected.  PERRL.  Ears and nose without any masses or lesions.  Mouth is pink and moist Heart: regular, rate, and rhythm.  Normal s1,s2. No obvious murmurs, gallops, or rubs noted.  Palpable radial and pedal pulses bilaterally Lungs: CTAB, no wheezes, rhonchi, or rales noted.  Respiratory effort nonlabored Abd: soft, NT, ND, +BS, no masses, hernias, or organomegaly MS: all 4 extremities are symmetrical with no cyanosis, clubbing, or edema. Skin: warm and dry with no masses, lesions, or rashes Neuro: Cranial nerves 2-12 grossly intact, sensation is grossly intact throughout Psych: A&Ox3 with an appropriate affect.   Results for orders placed or performed during the hospital encounter of 09/20/19 (from the past 48 hour(s))  Iron and TIBC     Status: None   Collection Time: 09/21/19  2:30 PM  Result Value Ref Range   Iron 64 28 - 170 ug/dL   TIBC 395 250 - 450 ug/dL   Saturation Ratios 16 10.4 - 31.8 %   UIBC 331 ug/dL    Comment:  Performed at [facility], [place] [address], [place], [place] [address]  Ferritin     Status: None   Collection Time: 09/21/19  2:30 PM  Result Value Ref Range   Ferritin 12 11 - 307 ng/mL    Comment: Performed at [facility], [place] [address], [place], [place] [address]  CBC with Differential/Platelet     Status: Abnormal   Collection Time: 09/22/19  5:37 AM  Result Value Ref Range   WBC 3.6 (L) 4.0 - 10.5 K/uL   RBC 2.54 (L) 3.87 - 5.11 MIL/uL   Hemoglobin 7.9 (L) 12.0 - 15.0 g/dL   HCT 24.4 (L) 36.0 - 46.0 %   MCV 96.1 80.0 - 100.0 fL   MCH 31.1 26.0 - 34.0 pg   MCHC 32.4 30.0 - 36.0 g/dL   RDW 15.3 11.5 - 15.5 %  Platelets 228 150 - 400 K/uL   nRBC 0.0 0.0 - 0.2 %   Neutrophils Relative % 57 %   Neutro Abs 2.1 1.7 - 7.7 K/uL   Lymphocytes Relative 29 %   Lymphs Abs 1.0 0.7 - 4.0 K/uL   Monocytes Relative 8 %   Monocytes Absolute 0.3 0.1 - 1.0 K/uL   Eosinophils Relative 5 %   Eosinophils Absolute 0.2 0.0 - 0.5 K/uL   Basophils Relative 0 %   Basophils Absolute 0.0 0.0 - 0.1 K/uL   Immature Granulocytes 1 %   Abs Immature Granulocytes 0.02 0.00 - 0.07 K/uL    Comment: Performed at [facility], [place] [address] [place], [place] [address]  Prepare RBC (crossmatch)     Status: None   Collection Time: 09/22/19 10:33 AM  Result Value Ref Range   Order Confirmation      ORDER PROCESSED BY BLOOD BANK Performed at [facility], [place] [address], [place], [place] [address]   CBC with Differential/Platelet     Status: Abnormal   Collection Time: 09/23/19  5:23 AM  Result Value Ref Range   WBC 4.0 4.0 - 10.5 K/uL   RBC 3.09 (L) 3.87 - 5.11 MIL/uL   Hemoglobin 9.4 (L) 12.0 - 15.0 g/dL   HCT 29.0 (L) 36.0 - 46.0 %   MCV 93.9 80.0 - 100.0 fL   MCH 30.4 26.0 - 34.0 pg   MCHC 32.4 30.0 - 36.0 g/dL   RDW 16.7 (H) 11.5 - 15.5 %   Platelets 224 150 - 400 K/uL   nRBC 0.0 0.0 - 0.2 %   Neutrophils Relative % 65  %   Neutro Abs 2.7 1.7 - 7.7 K/uL   Lymphocytes Relative 21 %   Lymphs Abs 0.8 0.7 - 4.0 K/uL   Monocytes Relative 10 %   Monocytes Absolute 0.4 0.1 - 1.0 K/uL   Eosinophils Relative 3 %   Eosinophils Absolute 0.1 0.0 - 0.5 K/uL   Basophils Relative 1 %   Basophils Absolute 0.0 0.0 - 0.1 K/uL   Immature Granulocytes 0 %   Abs Immature Granulocytes 0.01 0.00 - 0.07 K/uL    Comment: Performed at [facility], [place] [address], [place], [place] [id number]  Basic metabolic panel     Status: Abnormal   Collection Time: 09/23/19  5:23 AM  Result Value Ref Range   Sodium 141 135 - 145 mmol/L   Potassium 4.0 3.5 - 5.1 mmol/L   Chloride 109 98 - 111 mmol/L   CO2 23 22 - 32 mmol/L   Glucose, Bld 108 (H) 70 - 99 mg/dL    Comment: Glucose reference range applies only to samples taken after fasting for at least 8 hours.   BUN 13 8 - 23 mg/dL   Creatinine, Ser 0.65 0.44 - 1.00 mg/dL   Calcium 8.3 (L) 8.9 - 10.3 mg/dL   GFR calc non Af Amer >60 >60 mL/min   GFR calc Af Amer >60 >60 mL/min   Anion gap 9 5 - 15    Comment: Performed at [facility], [place] [address] [place], [place] [address]   No results found.    Assessment/Plan Hx of breast cancer s/p bilateral mastectomy 2019 Chronic back pain with sciatica   Non-bleeding gastric ulcer - s/p EGD, per GI Rectosigmoid colon mass - s/p colonoscopy 5/24 Dr. Alessandra Bevels - pathology pending - CEA pending, CT chest/abdomen/pelvis pending - no indication for urgent/emergent surgical  intervention at this time - needs to be discussed in GI tumor board with oncology, GI and general surgery - pt ok to discharge from a surgery standpoint if not obstructed and hgb stabilized - will reach out to colorectal surgeons and ensure that she has follow up   FEN: soft diet, IVF @ 75 cc/h VTE: SCDs, no chemical prophylaxis in setting of GIB ID: no current abx  Norm Parcel, [facility] Surgery 09/23/2019,  1:13 PM Please see Amion for pager number during day hours 7:00am-4:30pm  Agree with above.  Alphonsa Overall, MD, [facility] Surgery Office phone:  [phone]

## 2019-09-23 NOTE — Anesthesia Preprocedure Evaluation (Addendum)
Anesthesia Evaluation  Patient identified by MRN, date of birth, ID band Patient awake    Reviewed: Allergy & Precautions, NPO status , Patient's Chart, lab work & pertinent test results  Airway Mallampati: II  TM Distance: >3 FB Neck ROM: Full    Dental  (+) Dental Advisory Given, Teeth Intact   Pulmonary neg pulmonary ROS,    breath sounds clear to auscultation       Cardiovascular negative cardio ROS   Rhythm:Regular Rate:Normal     Neuro/Psych  Headaches, negative psych ROS   GI/Hepatic negative GI ROS, (+)     substance abuse  alcohol use,   Endo/Other  negative endocrine ROS  Renal/GU negative Renal ROS  negative genitourinary   Musculoskeletal negative musculoskeletal ROS (+)   Abdominal   Peds  Hematology  (+) Blood dyscrasia, anemia , Symptomatic anemia, hct 29 s/p 2 units prbcs    Anesthesia Other Findings Hx breast ca s/p mastectomy   Reproductive/Obstetrics negative OB ROS                            Anesthesia Physical Anesthesia Plan  ASA: II  Anesthesia Plan: MAC   Post-op Pain Management:    Induction:   PONV Risk Score and Plan: 2 and Propofol infusion and TIVA  Airway Management Planned: Natural Airway and Simple Face Mask  Additional Equipment: None  Intra-op Plan:   Post-operative Plan:   Informed Consent: I have reviewed the patients History and Physical, chart, labs and discussed the procedure including the risks, benefits and alternatives for the proposed anesthesia with the patient or authorized representative who has indicated his/her understanding and acceptance.       Plan Discussed with: CRNA  Anesthesia Plan Comments:         Anesthesia Quick Evaluation

## 2019-09-23 NOTE — Transfer of Care (Signed)
Immediate Anesthesia Transfer of Care Note  Patient: Sonia Sandoval  Procedure(s) Performed: COLONOSCOPY WITH PROPOFOL and EGD (N/A ) ESOPHAGOGASTRODUODENOSCOPY (EGD) (N/A ) BIOPSY SUBMUCOSAL TATTOO INJECTION  Patient Location: PACU and Endoscopy Unit  Anesthesia Type:MAC  Level of Consciousness: awake, alert  and oriented  Airway & Oxygen Therapy: Patient Spontanous Breathing and Patient connected to face mask oxygen  Post-op Assessment: Report given to RN and Post -op Vital signs reviewed and stable  Post vital signs: Reviewed and stable  Last Vitals:  Vitals Value Taken Time  BP    Temp    Pulse 72 09/23/19 1114  Resp 17 09/23/19 1114  SpO2 100 % 09/23/19 1114  Vitals shown include unvalidated device data.  Last Pain:  Vitals:   09/23/19 0920  TempSrc: Oral  PainSc: 0-No pain      Patients Stated Pain Goal: 0 (01/22/29 0762)  Complications: No apparent anesthesia complications

## 2019-09-23 NOTE — Op Note (Signed)
[facility] Patient Name: Sonia Sandoval Procedure Date: 09/23/2019 MRN: [id number] Attending MD: Otis Brace , MD Date of Birth: 1950-08-01 CSN: [id number] Age: 69 Admit Type: Outpatient Procedure:                Upper GI endoscopy Indications:              Iron deficiency anemia Providers:                Otis Brace, MD, Clyde Lundborg, RN, Josie Dixon, RN, Theodora Blow, Technician Referring MD:              Medicines:                Sedation Administered by an Anesthesia Professional Complications:            No immediate complications. Estimated Blood Loss:     Estimated blood loss was minimal. Procedure:                Pre-Anesthesia Assessment:                           - Prior to the procedure, a History and Physical                            was performed, and patient medications and                            allergies were reviewed. The patient's tolerance of                            previous anesthesia was also reviewed. The risks                            and benefits of the procedure and the sedation                            options and risks were discussed with the patient.                            All questions were answered, and informed consent                            was obtained. Prior Anticoagulants: The patient has                            taken no previous anticoagulant or antiplatelet                            agents except for NSAID medication. ASA Grade                            Assessment: II - A patient with mild systemic  disease. After reviewing the risks and benefits,                            the patient was deemed in satisfactory condition to                            undergo the procedure.                           After obtaining informed consent, the endoscope was                            passed under direct vision. Throughout the      procedure, the patient's blood pressure, pulse, and                            oxygen saturations were monitored continuously. The                            [id number]-[id number] Olympus gastroscope was                            introduced through the mouth, and advanced to the                            second part of duodenum. The upper GI endoscopy was                            accomplished without difficulty. The patient                            tolerated the procedure well. Scope In: Scope Out: Findings:      The Z-line was regular and was found 35 cm from the incisors.      A small hiatal hernia was present.      One non-bleeding superficial gastric ulcer was found in the prepyloric       region of the stomach. The lesion was 3 mm in largest dimension.       Biopsies were taken with a cold forceps for histology.      Hematin (altered blood/coffee-ground-like material) was found in the       gastric body.      The cardia and gastric fundus were normal on retroflexion.      The duodenal bulb, first portion of the duodenum and second portion of       the duodenum were normal. Impression:               - Z-line regular, 35 cm from the incisors.                           - Small hiatal hernia.                           - Non-bleeding gastric ulcer. Biopsied.                           -  Hematin (altered blood/coffee-ground-like                            material) in the gastric body.                           - Normal duodenal bulb, first portion of the                            duodenum and second portion of the duodenum. Moderate Sedation:      Moderate (conscious) sedation was personally administered by an       anesthesia professional. The following parameters were monitored: oxygen       saturation, heart rate, blood pressure, and response to care. Recommendation:           - Perform a colonoscopy today. Procedure Code(s):        --- Professional ---                            [phone], Esophagogastroduodenoscopy, flexible,                            transoral; with biopsy, single or multiple Diagnosis Code(s):        --- Professional ---                           K44.9, Diaphragmatic hernia without obstruction or                            gangrene                           K25.9, Gastric ulcer, unspecified as acute or                            chronic, without hemorrhage or perforation                           K92.2, Gastrointestinal hemorrhage, unspecified                           D50.9, Iron deficiency anemia, unspecified CPT copyright 2019 American Medical Association. All rights reserved. The codes documented in this report are preliminary and upon coder review may  be revised to meet current compliance requirements. Otis Brace, MD Otis Brace, MD 09/23/2019 11:13:51 AM Number of Addenda: 0

## 2019-09-23 NOTE — Anesthesia Postprocedure Evaluation (Signed)
Anesthesia Post Note  Patient: SCHAE CANDO  Procedure(s) Performed: COLONOSCOPY WITH PROPOFOL and EGD (N/A ) ESOPHAGOGASTRODUODENOSCOPY (EGD) (N/A ) BIOPSY SUBMUCOSAL TATTOO INJECTION     Patient location during evaluation: PACU Anesthesia Type: MAC Level of consciousness: awake and alert Pain management: pain level controlled Vital Signs Assessment: post-procedure vital signs reviewed and stable Respiratory status: spontaneous breathing, nonlabored ventilation and respiratory function stable Cardiovascular status: blood pressure returned to baseline and stable Postop Assessment: no apparent nausea or vomiting Anesthetic complications: no    Last Vitals:  Vitals:   09/23/19 0920 09/23/19 1114  BP: 128/68 103/68  Pulse: 77 72  Resp: 19 17  Temp:  36.6 C  SpO2: 99% 100%    Last Pain:  Vitals:   09/23/19 1114  TempSrc: Oral  PainSc: 0-No pain                 Pervis Hocking

## 2019-09-23 NOTE — Progress Notes (Signed)
Sonia Sandoval presented to the emergency room 09/20/2019 with presyncopal symptoms and shortness of breath.  She was found to have a hemoglobin of 7.4, and was admitted for transfusion and further work-up.  She was guaiac positive and today she underwent EGD and colonoscopy which showed a nonbleeding gastric ulcer and a 6 cm lesion at the rectosigmoid junction, with biopsy pending.  CTs of the chest abdomen and pelvis showed multiple lung nodules which could be malignant, no obvious involvement of the liver, and some regional adenopathy around the recto sigmoid lesion.  Recall Sonia Sandoval has a history of stage IIb breast cancer, status post bilateral mastectomies August 2019, status post right-sided chest wall irradiation and then on anastrozole, on which she continues.  I oriented Sonia Sandoval to her situation right now.  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 Glean Hess description and not a metastatic deposit from her breast cancer.  Because there is regional adenopathy this is likely to be at least stage III.  She knows stage III generally is treated with surgery and chemotherapy and if it is rectal primarily sometimes radiation is also used.  She understands that she has some lung lesions of uncertain significance and these will need to be further evaluated.  I also let her know that we have a gastrointestinal tumor group, namely Dr. Benay Spice and Dr. Burr Medico, and once we have pathologic confirmation of what we are dealing with it would be useful for her to meet with one of them.  I will initiate that at this point.  Anticipate her discharge tomorrow.  She knows to call us for any other issue that may develop before her next visit here.

## 2019-09-23 NOTE — Progress Notes (Signed)
PROGRESS NOTE  Sonia Sandoval [id number] DOB: Aug 01, 1950 DOA: 09/20/2019 PCP: [name], FNP  HPI/Recap of past 24 hours: Patient is a 69 year old female with history of breast cancer status post bilateral mastectomy, lymph node dissection, chronic back pain who presents to the emergency department for the evaluation of fatigue, lightheadedness and exertional dyspnea. Patient is usually very physically active.Since last couple of weeks, she has been feeling weak while working on her yard. 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 She noticed some black tarry stools recently and also some blood once or twice in her stool. She had undergone colonoscopy about 5 to 10 years ago at [facility] and it was normal.On presentation, she was hemodynamically stable. Hemoglobin was7.4. Her hemoglobin was 12.7 in October. FOBT was positive. She was transfused with a unit of PRBC.This morning hemoglobin in the range of 8.4 with on-going GI losses will give another unit of PRBCs today. GI has been consulted. Started on Protonix IV. Plan for EGD/colonoscopy on Monday.  09/23/19: Seen post EGD/colonoscopy with her husband at her bedside.  She feels better post RBC transfusion.  No dizziness, palpitations or chest discomfort.  Recto sigmoid mass biopsied, non bleeding gastric ulcer also biopsied.  She requested Dr. Jana Hakim her oncologist made aware of her admission.  Oncology consulted, highly appreciate assistance.  Assessment/Plan: Principal Problem:   Symptomatic anemia Active Problems:   GI bleed  Symptomatic anemia/ suspected chronic blood loss anemia in the setting of GI bleed Presented with dyspnea on exertion, fatigue, lightheadedness, hemoglobin 7.4 on presentation with positive FOBT.  Baseline hemoglobin 10. Seen by GI, EGD and colonoscopy completed on 09/23/2019 by Dr. Luan Pulling EGD showed nonbleeding gastric ulcer biopsies taken, colonoscopy showed rectosigmoid mass biopsies  taken.  Follow results. She was transfused 2 unit PRBCs Hemoglobin stable this morning 9.4 Continue to monitor H&H Patient advised to avoid NSAIDs, she is receptive.  Rectosigmoid mass, suspected malignant tumor Biopsied, follow biopsies result Oncology consulted, Dr. Jana Hakim CT chest/abdomen pelvis with contrast ordered by GI likely for staging.  Nonbleeding gastric ulcer Continue to avoid NSAIDs Continue PPI  History of breast cancer Restart anastrozole Followed by oncology    Code Status: Full code  Family Communication: Husband at bedside     Consultants:  Oncology  Procedures:  EGD  Colonoscopy  Antimicrobials:  None  DVT prophylaxis: SCDs  Status is: Inpatient    Dispo: The patient is from: Home               Anticipated d/c is to: Home 24 to 48 hours               Anticipated d/c date is: 09/24/2019               Patient currently ongoing GI, oncology evaluation.       Objective: Vitals:   09/23/19 1114 09/23/19 1120 09/23/19 1130 09/23/19 1326  BP: 103/68 97/60 112/76 127/67  Pulse: 72 79 74 79  Resp: 17 14 14 16   Temp: 97.8 F (36.6 C)   98.8 F (37.1 C)  TempSrc: Oral   Oral  SpO2: 100% 100% 98% 100%  Weight:      Height:        Intake/Output Summary (Last 24 hours) at 09/23/2019 1418 Last data filed at 09/23/2019 1114 Gross per 24 hour  Intake 2137.08 ml  Output --  Net 2137.08 ml   Filed Weights   09/20/19 1714 09/20/19 2254 09/23/19 0920  Weight: 63.5 kg 65.7 kg 63.5 kg    Exam:  . General: 69 y.o. year-old female well developed well nourished in no acute distress.  Alert and oriented x3. . Cardiovascular: Regular rate and rhythm with no rubs or gallops.  No thyromegaly or JVD noted.   Marland Kitchen Respiratory: Clear to auscultation with no wheezes or rales. Good inspiratory effort. . Abdomen: Soft nontender nondistended with normal bowel sounds x4 quadrants. . Musculoskeletal: No lower extremity edema. 2/4 pulses in all 4  extremities. Marland Kitchen Psychiatry: Mood is appropriate for condition and setting   Data Reviewed: CBC: Recent Labs  Lab 09/20/19 1736 09/21/19 0716 09/22/19 0537 09/23/19 0523  WBC 5.5  --  3.6* 4.0  NEUTROABS 3.7  --  2.1 2.7  HGB 7.4* 8.4* 7.9* 9.4*  HCT 22.1* 25.6* 24.4* 29.0*  MCV 94.8  --  96.1 93.9  PLT 227  --  228 [id number]   Basic Metabolic Panel: Recent Labs  Lab 09/20/19 1736 09/21/19 0100 09/23/19 0523  NA 137 139 141  K 3.8 3.6 4.0  CL 105 107 109  CO2 25 25 23   GLUCOSE 112* 115* 108*  BUN 24* 24* 13  CREATININE 0.72 0.61 0.65  CALCIUM 8.7* 8.4* 8.3*   GFR: Estimated Creatinine Clearance: 60.5 mL/min (by C-G formula based on SCr of 0.65 mg/dL). Liver Function Tests: Recent Labs  Lab 09/20/19 1736  AST 37  ALT 43  ALKPHOS 51  BILITOT 0.4  PROT 5.6*  ALBUMIN 3.6   No results for input(s): LIPASE, AMYLASE in the last 168 hours. No results for input(s): AMMONIA in the last 168 hours. Coagulation Profile: No results for input(s): INR, PROTIME in the last 168 hours. Cardiac Enzymes: No results for input(s): CKTOTAL, CKMB, CKMBINDEX, TROPONINI in the last 168 hours. BNP (last 3 results) No results for input(s): PROBNP in the last 8760 hours. HbA1C: No results for input(s): HGBA1C in the last 72 hours. CBG: No results for input(s): GLUCAP in the last 168 hours. Lipid Profile: No results for input(s): CHOL, HDL, LDLCALC, TRIG, CHOLHDL, LDLDIRECT in the last 72 hours. Thyroid Function Tests: No results for input(s): TSH, T4TOTAL, FREET4, T3FREE, THYROIDAB in the last 72 hours. Anemia Panel: Recent Labs    09/21/19 1430  FERRITIN 12  TIBC 395  IRON 64   Urine analysis:    Component Value Date/Time   COLORURINE YELLOW 09/20/2019 1945   APPEARANCEUR CLEAR 09/20/2019 1945   LABSPEC <1.005 (L) 09/20/2019 1945   PHURINE 5.5 09/20/2019 1945   GLUCOSEU NEGATIVE 09/20/2019 1945   HGBUR NEGATIVE 09/20/2019 1945   BILIRUBINUR NEGATIVE 09/20/2019 [place] NEGATIVE 09/20/2019 1945   PROTEINUR NEGATIVE 09/20/2019 1945   NITRITE NEGATIVE 09/20/2019 1945   LEUKOCYTESUR SMALL (A) 09/20/2019 1945   Sepsis Labs: @LABRCNTIP (procalcitonin:4,lacticidven:4)  ) Recent Results (from the past 240 hour(s))  SARS Coronavirus 2 by RT PCR (hospital order, performed in [place] hospital lab) Nasopharyngeal Nasopharyngeal Swab     Status: None   Collection Time: 09/20/19  9:25 PM   Specimen: Nasopharyngeal Swab  Result Value Ref Range Status   SARS Coronavirus 2 NEGATIVE NEGATIVE Final    Comment: (NOTE) SARS-CoV-2 target nucleic acids are NOT DETECTED. The SARS-CoV-2 RNA is generally detectable in upper and lower respiratory specimens during the acute phase of infection. The lowest concentration of SARS-CoV-2 viral copies this assay can detect is 250 copies / mL. A negative result does not preclude SARS-CoV-2 infection and should not be used as the  sole basis for treatment or other patient management decisions.  A negative result may occur with improper specimen collection / handling, submission of specimen other than nasopharyngeal swab, presence of viral mutation(s) within the areas targeted by this assay, and inadequate number of viral copies (<250 copies / mL). A negative result must be combined with clinical observations, patient history, and epidemiological information. Fact Sheet for Patients:   StrictlyIdeas.no Fact Sheet for Healthcare Providers: BankingDealers.co.za This test is not yet approved or cleared  by the [place] FDA and has been authorized for detection and/or diagnosis of SARS-CoV-2 by FDA under an Emergency Use Authorization (EUA).  This EUA will remain in effect (meaning this test can be used) for the duration of the COVID-19 declaration under Section 564(b)(1) of the Act, 21 U.S.C. section 360bbb-3(b)(1), unless the authorization is terminated or revoked  sooner. Performed at [name], [address] [place], [place] [address]       Studies: No results found.  Scheduled Meds: . anastrozole  1 mg Oral Daily  . iohexol  500 mL Oral Q1H  . iohexol      . pantoprazole (PROTONIX) IV  40 mg Intravenous Q12H    Continuous Infusions: . sodium chloride 75 mL/hr at 09/22/19 1934     LOS: 3 days     Kayleen Memos, MD [facility] Pager [phone]  If 7PM-7AM, please contact night-coverage [email or web address] Password [email or web address] 09/23/2019, 2:18 PM

## 2019-09-23 NOTE — Interval H&P Note (Signed)
History and Physical Interval Note:  09/23/2019 10:06 AM  Sonia Sandoval  has presented today for surgery, with the diagnosis of symptomatic anemia.  The various methods of treatment have been discussed with the patient and family. After consideration of risks, benefits and other options for treatment, the patient has consented to  Procedure(s): COLONOSCOPY WITH PROPOFOL and EGD (N/A) ESOPHAGOGASTRODUODENOSCOPY (EGD) (N/A) as a surgical intervention.  The patient's history has been reviewed, patient examined, no change in status, stable for surgery.  I have reviewed the patient's chart and labs.  Questions were answered to the patient's satisfaction.     [name]

## 2019-09-23 NOTE — Op Note (Signed)
[facility] Patient Name: Sonia Sandoval Procedure Date: 09/23/2019 MRN: [id number] Attending MD: Otis Brace , MD Date of Birth: Jul 05, 1950 CSN: [id number] Age: 69 Admit Type: Outpatient Procedure:                Colonoscopy Indications:              Last colonoscopy: date unknown (unable to locate                            last colonoscopy report), Last colonoscopy 10 years                            ago, Iron deficiency anemia Providers:                Otis Brace, MD, Clyde Lundborg, RN, Josie Dixon, RN, Theodora Blow, Technician Referring MD:              Medicines:                Sedation Administered by an Anesthesia Professional Complications:            No immediate complications. Estimated Blood Loss:     Estimated blood loss was minimal. Procedure:                Pre-Anesthesia Assessment:                           - Prior to the procedure, a History and Physical                            was performed, and patient medications and                            allergies were reviewed. The patient's tolerance of                            previous anesthesia was also reviewed. The risks                            and benefits of the procedure and the sedation                            options and risks were discussed with the patient.                            All questions were answered, and informed consent                            was obtained. Prior Anticoagulants: The patient has                            taken no previous anticoagulant or antiplatelet  agents except for NSAID medication. ASA Grade                            Assessment: II - A patient with mild systemic                            disease. After reviewing the risks and benefits,                            the patient was deemed in satisfactory condition to                            undergo the procedure.         - Prior to the procedure, a History and Physical                            was performed, and patient medications and                            allergies were reviewed. The patient's tolerance of                            previous anesthesia was also reviewed. The risks                            and benefits of the procedure and the sedation                            options and risks were discussed with the patient.                            All questions were answered, and informed consent                            was obtained. Prior Anticoagulants: The patient has                            taken no previous anticoagulant or antiplatelet                            agents except for NSAID medication. ASA Grade                            Assessment: II - A patient with mild systemic                            disease. After reviewing the risks and benefits,                            the patient was deemed in satisfactory condition to                            undergo the procedure.  After obtaining informed consent, the colonoscope                            was passed under direct vision. Throughout the                            procedure, the patient's blood pressure, pulse, and                            oxygen saturations were monitored continuously. The                            [id number]-[id number] Olympus pediatric colposcope                            was introduced through the anus and advanced to the                            cecum, identified by appendiceal orifice and                            ileocecal valve. The colonoscopy was performed                            without difficulty. The patient tolerated the                            procedure well. The quality of the bowel                            preparation was poor. Scope In: 10:39:28 AM Scope Out: 11:04:57 AM Scope Withdrawal Time: 0 hours 13 minutes 20 seconds  Total  Procedure Duration: 0 hours 25 minutes 29 seconds  Findings:      Skin tags were found on perianal exam.      A frond-like/villous and ulcerated non-obstructing large mass was found       in the recto-sigmoid colon at around 18 cm. The mass measured six cm in       length. In addition, its diameter measured five mm. Oozing was present.       Biopsies were taken with a cold forceps for histology. Area was tattooed       with an injection of 1 mL of Spot (carbon black).      Retroflexion in the right colon was performed.      Internal hemorrhoids were found during retroflexion. The hemorrhoids       were medium-sized. Impression:               - Preparation of the colon was poor.                           - Perianal skin tags found on perianal exam.                           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 Biopsied. Tattooed.                           - Internal hemorrhoids. Moderate Sedation:      Moderate (conscious) sedation was personally administered by an       anesthesia professional. The following parameters were monitored: oxygen       saturation, heart rate, blood pressure, and response to care. Recommendation:           - Return patient to hospital ward for ongoing care.                           - Soft diet.                           - Continue present medications.                           - Await pathology results.                           - Repeat colonoscopy in 1 year for surveillance.                           - Perform CT scan (computed tomography) of the                            chest, abdomen and pelvis with contrast today.                           - Refer to a surgeon today. Procedure Code(s):        --- Professional ---                           [id number], Colonoscopy, flexible; with directed                            submucosal injection(s), any substance                           [id number], Colonoscopy, flexible; with  biopsy, single                            or multiple Diagnosis Code(s):        --- Professional ---                           K64.8, Other hemorrhoids                           D49.0, Neoplasm of unspecified behavior of                            digestive system                           K64.4, Residual hemorrhoidal skin tags  D50.9, Iron deficiency anemia, unspecified CPT copyright 2019 American Medical Association. All rights reserved. The codes documented in this report are preliminary and upon coder review may  be revised to meet current compliance requirements. Otis Brace, MD Otis Brace, MD 09/23/2019 11:21:44 AM Number of Addenda: 0

## 2019-09-24 ENCOUNTER — Encounter: Payer: Self-pay | Admitting: *Deleted

## 2019-09-24 ENCOUNTER — Other Ambulatory Visit: Payer: Self-pay

## 2019-09-24 DIAGNOSIS — D500 Iron deficiency anemia secondary to blood loss (chronic): Secondary | ICD-10-CM | POA: Diagnosis not present

## 2019-09-24 DIAGNOSIS — D649 Anemia, unspecified: Secondary | ICD-10-CM | POA: Diagnosis not present

## 2019-09-24 LAB — CBC WITH DIFFERENTIAL/PLATELET
Abs Immature Granulocytes: 0.01 10*3/uL (ref 0.00–0.07)
Basophils Absolute: 0 10*3/uL (ref 0.0–0.1)
Basophils Relative: 0 %
Eosinophils Absolute: 0.1 10*3/uL (ref 0.0–0.5)
Eosinophils Relative: 3 %
HCT: 25.8 % — ABNORMAL LOW (ref 36.0–46.0)
Hemoglobin: 8 g/dL — ABNORMAL LOW (ref 12.0–15.0)
Immature Granulocytes: 0 %
Lymphocytes Relative: 13 %
Lymphs Abs: 0.5 10*3/uL — ABNORMAL LOW (ref 0.7–4.0)
MCH: 30.4 pg (ref 26.0–34.0)
MCHC: 31 g/dL (ref 30.0–36.0)
MCV: 98.1 fL (ref 80.0–100.0)
Monocytes Absolute: 0.4 10*3/uL (ref 0.1–1.0)
Monocytes Relative: 9 %
Neutro Abs: 3 10*3/uL (ref 1.7–7.7)
Neutrophils Relative %: 75 %
Platelets: 189 10*3/uL (ref 150–400)
RBC: 2.63 MIL/uL — ABNORMAL LOW (ref 3.87–5.11)
RDW: 16.3 % — ABNORMAL HIGH (ref 11.5–15.5)
WBC: 4 10*3/uL (ref 4.0–10.5)
nRBC: 0 % (ref 0.0–0.2)

## 2019-09-24 LAB — CEA: CEA: 1.4 ng/mL (ref 0.0–4.7)

## 2019-09-24 MED ORDER — FERROUS SULFATE 325 (65 FE) MG PO TABS: 325.0000 mg | ORAL_TABLET | Freq: Every day | ORAL | 0 refills | Status: DC

## 2019-09-24 MED ORDER — FERROUS SULFATE 325 (65 FE) MG PO TABS
325.0000 mg | ORAL_TABLET | Freq: Every day | ORAL | Status: DC
  Administered 2019-09-24: 325 mg via ORAL
  Filled 2019-09-24: qty 1

## 2019-09-24 MED ORDER — PANTOPRAZOLE SODIUM 40 MG PO TBEC
40.0000 mg | DELAYED_RELEASE_TABLET | Freq: Every day | ORAL | Status: DC
  Administered 2019-09-24: 40 mg via ORAL
  Filled 2019-09-24: qty 1

## 2019-09-24 MED ORDER — PANTOPRAZOLE SODIUM 40 MG PO TBEC: 40.0000 mg | DELAYED_RELEASE_TABLET | Freq: Every day | ORAL | 0 refills | Status: DC

## 2019-09-24 MED ORDER — POLYETHYLENE GLYCOL 3350 17 G PO PACK: 17.0000 g | PACK | Freq: Every day | ORAL | 0 refills | Status: DC | PRN

## 2019-09-24 MED ORDER — POLYETHYLENE GLYCOL 3350 17 G PO PACK: 17.0000 g | PACK | Freq: Every day | ORAL | Status: DC | PRN

## 2019-09-24 NOTE — Progress Notes (Signed)
Went over discharge instructions w/ patient and her husband Sonia Sandoval). Patient and husband verbalized understanding.

## 2019-09-24 NOTE — Progress Notes (Addendum)
[facility] Progress Note  1 Day Post-Op  Subjective: Patient with no further BM overnight. Denies abdominal pain. Wants to go home today.   Objective: Vital signs in last 24 hours: Temp:  [97.8 F (36.6 C)-98.8 F (37.1 C)] 98.1 F (36.7 C) (05/25 0515) Pulse Rate:  [72-86] 75 (05/25 0515) Resp:  [14-17] 16 (05/25 0515) BP: (97-136)/(57-76) 110/57 (05/25 0515) SpO2:  [98 %-100 %] 98 % (05/25 0515) Last BM Date: 09/22/19  Intake/Output from previous day: 05/24 0701 - 05/25 0700 In: 1383 [P.O.:483; I.V.:900] Out: -  Intake/Output this shift: Total I/O In: 65 [P.O.:355] Out: -   PE: General: pleasant, WD, WN white female who is laying in bed in NAD HEENT: head is normocephalic, atraumatic.  Sclera are noninjected.  PERRL.  Ears and nose without any masses or lesions.  Mouth is pink and moist Heart: regular, rate, and rhythm.  Normal s1,s2. No obvious murmurs, gallops, or rubs noted.  Palpable radial and pedal pulses bilaterally Lungs: CTAB, no wheezes, rhonchi, or rales noted.  Respiratory effort nonlabored Abd: soft, NT, ND, +BS, no masses, hernias, or organomegaly MS: all 4 extremities are symmetrical with no cyanosis, clubbing, or edema. Skin: warm and dry with no masses, lesions, or rashes Neuro: Cranial nerves 2-12 grossly intact, sensation is grossly intact throughout Psych: A&Ox3 with an appropriate affect.    Lab Results:  Recent Labs    09/23/19 0523 09/24/19 0545  WBC 4.0 4.0  HGB 9.4* 8.0*  HCT 29.0* 25.8*  PLT 224 189   BMET Recent Labs    09/23/19 0523  NA 141  K 4.0  CL 109  CO2 23  GLUCOSE 108*  BUN 13  CREATININE 0.65  CALCIUM 8.3*   PT/INR No results for input(s): LABPROT, INR in the last 72 hours. CMP     Component Value Date/Time   NA 141 09/23/2019 0523   K 4.0 09/23/2019 0523   CL 109 09/23/2019 0523   CO2 23 09/23/2019 0523   GLUCOSE 108 (H) 09/23/2019 0523   BUN 13 09/23/2019 0523   CREATININE 0.65 09/23/2019  0523   CREATININE 0.79 11/14/2018 0920   CALCIUM 8.3 (L) 09/23/2019 0523   PROT 5.6 (L) 09/20/2019 1736   ALBUMIN 3.6 09/20/2019 1736   AST 37 09/20/2019 1736   AST 27 11/14/2018 0920   ALT 43 09/20/2019 1736   ALT 26 11/14/2018 0920   ALKPHOS 51 09/20/2019 1736   BILITOT 0.4 09/20/2019 1736   BILITOT 0.6 11/14/2018 0920   GFRNONAA >60 09/23/2019 0523   GFRNONAA >60 11/14/2018 0920   GFRAA >60 09/23/2019 0523   GFRAA >60 11/14/2018 0920   Lipase     Component Value Date/Time   LIPASE 29 11/14/2018 0920       Studies/Results: CT CHEST W CONTRAST  Result Date: 09/23/2019 CLINICAL DATA:  Colorectal cancer staging, rectosigmoid mass identified by colonoscopy, history of breast cancer pain status post bilateral mastectomy EXAM: CT CHEST, ABDOMEN, AND PELVIS WITH CONTRAST TECHNIQUE: Multidetector CT imaging of the chest, abdomen and pelvis was performed following the standard protocol during bolus administration of intravenous contrast. CONTRAST:  162mL OMNIPAQUE IOHEXOL 300 MG/ML SOLN, additional oral enteric contrast COMPARISON:  None. FINDINGS: CT CHEST FINDINGS Cardiovascular: Aortic atherosclerosis. Normal heart size. No pericardial effusion. Mediastinum/Nodes: No enlarged mediastinal, hilar, or axillary lymph nodes. Thyroid gland, trachea, and esophagus demonstrate no significant findings. Lungs/Pleura: There are multiple small bilateral pulmonary nodules, the largest in the left lower lobe measuring 4 mm (  series 2, image 43). There is irregular subpleural opacity of the anterior right upper lobe (series 2, image 19). There is adjacent nodularity of the right pulmonary apex (series 2, image 13), most discrete nodular component measuring up to 1.1 cm (series 2, image 14). Heterogeneous airspace opacity of the perihilar left upper lobe (series 2, image 36). No pleural effusion or pneumothorax. Musculoskeletal: Status post bilateral mastectomy. CT ABDOMEN PELVIS FINDINGS Hepatobiliary: No  solid liver abnormality is seen. No gallstones, gallbladder wall thickening, or biliary dilatation. Pancreas: Unremarkable. No pancreatic ductal dilatation or surrounding inflammatory changes. Spleen: Normal in size without significant abnormality. Adrenals/Urinary Tract: Adrenal glands are unremarkable. Nonobstructive calculus of the inferior pole of the right kidney. Bladder is unremarkable. Stomach/Bowel: Stomach is within normal limits. 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 Eccentric wall thickening of the rectosigmoid colon (series 2, image 105). Vascular/Lymphatic: No significant vascular findings are present. There are prominent subcentimeter lymph nodes in the sigmoid mesocolon measuring up to 4 mm (series 2, image 101, series 4, image 68). No overly enlarged abdominal or pelvic lymph nodes. Reproductive: No mass or other abnormality. Other: No abdominal wall hernia or abnormality. No abdominopelvic ascites. Musculoskeletal: No acute or significant osseous findings. IMPRESSION: 1. There is eccentric wall thickening of the rectosigmoid colon, in keeping with mass identified and biopsied by colonoscopy. 2. There are prominent subcentimeter lymph nodes in the sigmoid mesocolon, suspicious for nodal metastatic disease. There is no other lymphadenopathy in the abdomen or pelvis. 3. There are multiple small bilateral pulmonary nodules measuring up to 4 mm, nonspecific although very suspicious for pulmonary metastatic disease. Given history of breast malignancy, previous imaging of the chest would be very helpful to assess for stability. This report may be addended for comparison if prior imaging can be obtained. 4. Status post bilateral mastectomy with irregular subpleural opacity of the anterior right upper lobe, consistent with history of breast malignancy and radiation fibrosis. 5. There is adjacent nodularity of the right pulmonary apex, the largest discrete nodular  component measuring up to 1.1 cm. This is more favored infectious or inflammatory although nonspecific and metastatic disease is not excluded. As as above, prior imaging would be very helpful to assess. 6. Small focus of nonspecific infectious or inflammatory airspace disease of the perihilar left upper lobe. Electronically Signed   By: Eddie Candle M.D.   On: 09/23/2019 16:56   CT ABDOMEN PELVIS W CONTRAST  Result Date: 09/23/2019 CLINICAL DATA:  Colorectal cancer staging, rectosigmoid mass identified by colonoscopy, history of breast cancer pain status post bilateral mastectomy EXAM: CT CHEST, ABDOMEN, AND PELVIS WITH CONTRAST TECHNIQUE: Multidetector CT imaging of the chest, abdomen and pelvis was performed following the standard protocol during bolus administration of intravenous contrast. CONTRAST:  139mL OMNIPAQUE IOHEXOL 300 MG/ML SOLN, additional oral enteric contrast COMPARISON:  None. FINDINGS: CT CHEST FINDINGS Cardiovascular: Aortic atherosclerosis. Normal heart size. No pericardial effusion. Mediastinum/Nodes: No enlarged mediastinal, hilar, or axillary lymph nodes. Thyroid gland, trachea, and esophagus demonstrate no significant findings. Lungs/Pleura: There are multiple small bilateral pulmonary nodules, the largest in the left lower lobe measuring 4 mm (series 2, image 43). There is irregular subpleural opacity of the anterior right upper lobe (series 2, image 19). There is adjacent nodularity of the right pulmonary apex (series 2, image 13), most discrete nodular component measuring up to 1.1 cm (series 2, image 14). Heterogeneous airspace opacity of the perihilar left upper lobe (series 2, image 36). No pleural effusion  or pneumothorax. Musculoskeletal: Status post bilateral mastectomy. CT ABDOMEN PELVIS FINDINGS Hepatobiliary: No solid liver abnormality is seen. No gallstones, gallbladder wall thickening, or biliary dilatation. Pancreas: Unremarkable. No pancreatic ductal dilatation or  surrounding inflammatory changes. Spleen: Normal in size without significant abnormality. Adrenals/Urinary Tract: Adrenal glands are unremarkable. Nonobstructive calculus of the inferior pole of the right kidney. Bladder is unremarkable. Stomach/Bowel: Stomach is within normal limits. 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 Eccentric wall thickening of the rectosigmoid colon (series 2, image 105). Vascular/Lymphatic: No significant vascular findings are present. There are prominent subcentimeter lymph nodes in the sigmoid mesocolon measuring up to 4 mm (series 2, image 101, series 4, image 68). No overly enlarged abdominal or pelvic lymph nodes. Reproductive: No mass or other abnormality. Other: No abdominal wall hernia or abnormality. No abdominopelvic ascites. Musculoskeletal: No acute or significant osseous findings. IMPRESSION: 1. There is eccentric wall thickening of the rectosigmoid colon, in keeping with mass identified and biopsied by colonoscopy. 2. There are prominent subcentimeter lymph nodes in the sigmoid mesocolon, suspicious for nodal metastatic disease. There is no other lymphadenopathy in the abdomen or pelvis. 3. There are multiple small bilateral pulmonary nodules measuring up to 4 mm, nonspecific although very suspicious for pulmonary metastatic disease. Given history of breast malignancy, previous imaging of the chest would be very helpful to assess for stability. This report may be addended for comparison if prior imaging can be obtained. 4. Status post bilateral mastectomy with irregular subpleural opacity of the anterior right upper lobe, consistent with history of breast malignancy and radiation fibrosis. 5. There is adjacent nodularity of the right pulmonary apex, the largest discrete nodular component measuring up to 1.1 cm. This is more favored infectious or inflammatory although nonspecific and metastatic disease is not excluded. As as above, prior  imaging would be very helpful to assess. 6. Small focus of nonspecific infectious or inflammatory airspace disease of the perihilar left upper lobe. Electronically Signed   By: Eddie Candle M.D.   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 Alessandra Bevels - pathology pending - CEA 1.4 (within normal range) - CT chest/abd/pelvis with possible nodal disease in the mesocolon around sigmoid, ?possible pulmonary lesions as well - oncology following  - no indication for urgent/emergent surgical intervention at this time - needs to be discussed in GI tumor board with oncology, GI and general surgery - pt ok to discharge from a surgery standpoint, follow up arranged and in chart  FEN: soft diet VTE: SCDs, no chemical prophylaxis in setting of GIB ID: no current abx  LOS: 4 days    Norm Parcel , [facility] 09/24/2019, 9:23 AM Please see Amion for pager number during day hours 7:00am-4:30pm  Agree with above. Her husband is in the room.  Their 31st anniversary is tomorrow. She has follow up with Dr. Dema Severin.  Alphonsa Overall, MD, [facility] Office phone:  [phone]

## 2019-09-24 NOTE — Discharge Instructions (Signed)

## 2019-09-24 NOTE — Discharge Summary (Signed)
Discharge Summary  Sonia Sandoval [id number] DOB: 04-10-1951  PCP: [name], FNP  Admit date: 09/20/2019 Discharge date: 09/24/2019  Time spent: 35 minutes  Recommendations for Outpatient Follow-up:  1. Follow-up with medical oncology in 1-2 weeks 2. Follow-up with GI oncology in 1-2 weeks 3. Follow-up with GI in 3 months 4. Follow-up with your PCP within a week 5. Take your medications as prescribed 6. Fall precautions  Discharge Diagnoses:  Active Hospital Problems   Diagnosis Date Noted  . Symptomatic anemia 09/21/2019  . GI bleed 09/20/2019    Resolved Hospital Problems  No resolved problems to display.    Discharge Condition: Stable.  Diet recommendation: Resume previous diet.  Vitals:   09/23/19 2035 09/24/19 0515  BP: 125/67 (!) 110/57  Pulse: 83 75  Resp: 16 16  Temp: 98.4 F (36.9 C) 98.1 F (36.7 C)  SpO2: 99% 98%    History of present illness:  Patient is a 69 year old female with history of breast cancer status post bilateral mastectomy, chronic back pain who presents to the emergency department for evaluation of fatigue, lightheadedness and exertional dyspnea. Patient is usually very physically active.Since last couple of weeks, she has been feeling weak while working on her yard. 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 She noticed some black tarry stools recently and also some blood once or twice in her stool. She had undergone colonoscopy about 5 to 10 years ago at [facility] and it was normal.  On presentation, her hemoglobin was7.4 from 12.7 in October. FOBT was positive. She was transfused 2 units of PRBCs.   Had EGD/colonoscopy on 09/23/19 by Dr. Alessandra Bevels which showed a large rectosigmoid mass.  Preliminary biopsy revealed finding of adenocarcinoma of the colon.  EGD done the same day showed tiny gastric ulcer.  Advised to avoid NSAIDS and to take Pantoprazole for the gastric ulcer daily.  GI also recommends daily iron  supplement for her anemia.  Okay to use Miralax for constipation.  Follow up in GI clinic in 3 months.   09/24/19: Seen and examined with her spouse at bedside.  Hemoglobin dropped this morning from 9.4-8.0.  Denies any overt bleeding, dizziness, chest pain, palpitations, or shortness of breath.  She has a follow-up appointment with her provider tomorrow, at that time her CBC will be repeated.   Hospital Course:  Principal Problem:   Symptomatic anemia Active Problems:   GI bleed  Symptomatic anemia/chronic blood loss anemia in the setting of malignancy and GI bleed Presented with dyspnea on exertion, fatigue, lightheadedness, hemoglobin 7.4 on presentation with positive FOBT.  Baseline hemoglobin 12. Seen by GI, EGD and colonoscopy completed on 09/23/2019 by Dr. Luan Pulling EGD showed nonbleeding gastric ulcer biopsies taken, colonoscopy showed large rectosigmoid mass biopsies taken.   Preliminary biopsy revealed finding of adenocarcinoma of the colon.  Post 2 unit PRBCs with hemoglobin drop from 9.4 to 8.0 Repeat CBC tomorrow 09/25/19 at your PCP's appointment. Avoid NSAIDS Start Ferrous sulfate 325 mg daily Start po pantoprazole 40 mg daily Follow up with GI in 3 months Follow up with GI oncology Dr. Benay Spice in 1-2 weeks Follow up with your medical oncologist Dr. Jana Hakim in 1-2 weeks Follow up with your PCP within a week, please keep your appointment tomorrow.  Rectosigmoid mass, preliminary report suggestive of adenocarcinoma of the colon Please follow up with providers stated above Oncology consulted, Dr. Jana Hakim CT chest/abdomen pelvis with contrast showed multiple lung nodules which could be malignant, no obvious  involvement of the liver, and some regional adenopathy around the recto sigmoid lesion.  Nonbleeding gastric ulcer Continue to avoid NSAIDs Continue PPI, pantoprazole daily Continue iron supplement daily Miralax prn for constipation while on iron  supplement. Follow up with GI  History of breast cancer Continue anastrozole Keep oncology's appointment    Code Status: Full code      Consultants:  Oncology  General surgery  GI  Procedures:  EGD  Colonoscopy    Discharge Exam: BP (!) 110/57 (BP Location: Left Arm)   Pulse 75   Temp 98.1 F (36.7 C)   Resp 16   Ht 5' 2.5" (1.588 m)   Wt 63.5 kg   SpO2 98%   BMI 25.20 kg/m  . General: 69 y.o. year-old female well developed well nourished in no acute distress.  Alert and oriented x3. . Cardiovascular: Regular rate and rhythm with no rubs or gallops.  No thyromegaly or JVD noted.   Marland Kitchen Respiratory: Clear to auscultation with no wheezes or rales. Good inspiratory effort. . Abdomen: Soft nontender nondistended with normal bowel sounds x4 quadrants. . Musculoskeletal: No lower extremity edema. 2/4 pulses in all 4 extremities. Marland Kitchen Psychiatry: Mood is appropriate for condition and setting  Discharge Instructions You were cared for by a hospitalist during your hospital stay. If you have any questions about your discharge medications or the care you received while you were in the hospital after you are discharged, you can call the unit and asked to speak with the hospitalist on call if the hospitalist that took care of you is not available. Once you are discharged, your primary care physician will handle any further medical issues. Please note that NO REFILLS for any discharge medications will be authorized once you are discharged, as it is imperative that you return to your primary care physician (or establish a relationship with a primary care physician if you do not have one) for your aftercare needs so that they can reassess your need for medications and monitor your lab values.   Allergies as of 09/24/2019      Reactions   Codeine Nausea And Vomiting, Other (See Comments)   headache      Medication List    STOP taking these medications   ALEVE-D SINUS &  HEADACHE PO   ibuprofen 200 MG tablet Commonly known as: ADVIL   naproxen sodium 220 MG tablet Commonly known as: ALEVE     TAKE these medications   anastrozole 1 MG tablet Commonly known as: ARIMIDEX Take 1 tablet (1 mg total) by mouth daily.   CALCIUM 1200 PO Take 1 tablet by mouth daily.   CENTRUM SILVER 50+WOMEN PO Take 1 tablet by mouth.   COQ10 PO Take 1 tablet by mouth daily.   ferrous sulfate 325 (65 FE) MG tablet Take 1 tablet (325 mg total) by mouth daily with breakfast.   pantoprazole 40 MG tablet Commonly known as: PROTONIX Take 1 tablet (40 mg total) by mouth daily. Start taking on: Sep 25, 2019   polyethylene glycol 17 g packet Commonly known as: MIRALAX / GLYCOLAX Take 17 g by mouth daily as needed for up to 14 days for mild constipation.   pyridOXINE 100 MG tablet Commonly known as: VITAMIN B-6 Take 100 mg by mouth daily.   tiZANidine 4 MG tablet Commonly known as: ZANAFLEX Take 4 mg by mouth at bedtime.   vitamin C 500 MG tablet Commonly known as: ASCORBIC ACID Take 500 mg by mouth daily.  Vitamin D3 25 MCG (1000 UT) Chew Chew 50 mcg by mouth daily.   Vitamin E 180 MG Caps Take 1 tablet by mouth daily.      Allergies  Allergen Reactions  . Codeine Nausea And Vomiting and Other (See Comments)    headache   Follow-up Information    [name], MD. Go on 10/09/2019.   Specialty: General Surgery Why: Follow up appointment scheduled for 9:00 AM. Please arrive 15 min prior to appointment time. Bring photo ID and insurance information.  Contact information: [place] [address] [phone]        Otis Brace, MD. Schedule an appointment as soon as possible for a visit in 3 month(s).   Specialty: Gastroenterology Why: Follow-up for colon cancer Contact information: [place] [address] [phone]        Magrinat, [name], MD. Call in 1 day(s).   Specialty:  Oncology Why: Please call for a post hospital follow up appointment. Contact information: [place] [id number] [phone]        Ladell Pier, MD. Call in 1 day(s).   Specialty: Oncology Why: Please call for a post hospital follow up appointment. Contact information: [place] [id number] [phone]        [name], [place]. Call in 1 day(s).   Specialty: Nurse Practitioner Why: Please call for a post hospital follow up appointment. Contact information: [place] [address] [phone]            The results of significant diagnostics from this hospitalization (including imaging, microbiology, ancillary and laboratory) are listed below for reference.    Significant Diagnostic Studies: DG Chest 2 View  Result Date: 09/20/2019 CLINICAL DATA:  Shortness of breath on exertion EXAM: CHEST - 2 VIEW COMPARISON:  None. FINDINGS: Cardiac shadow is within normal limits. The lungs are hyperinflated consistent with COPD. Bilateral breast surgery is noted. The lungs are clear. Mild apical scarring is noted. No bony abnormality is seen. IMPRESSION: COPD without acute abnormality. Electronically Signed   By: Inez Catalina M.D.   On: 09/20/2019 18:04   CT CHEST W CONTRAST  Result Date: 09/23/2019 CLINICAL DATA:  Colorectal cancer staging, rectosigmoid mass identified by colonoscopy, history of breast cancer pain status post bilateral mastectomy EXAM: CT CHEST, ABDOMEN, AND PELVIS WITH CONTRAST TECHNIQUE: Multidetector CT imaging of the chest, abdomen and pelvis was performed following the standard protocol during bolus administration of intravenous contrast. CONTRAST:  144mL OMNIPAQUE IOHEXOL 300 MG/ML SOLN, additional oral enteric contrast COMPARISON:  None. FINDINGS: CT CHEST FINDINGS Cardiovascular: Aortic atherosclerosis. Normal heart size. No pericardial effusion. Mediastinum/Nodes: No  enlarged mediastinal, hilar, or axillary lymph nodes. Thyroid gland, trachea, and esophagus demonstrate no significant findings. Lungs/Pleura: There are multiple small bilateral pulmonary nodules, the largest in the left lower lobe measuring 4 mm (series 2, image 43). There is irregular subpleural opacity of the anterior right upper lobe (series 2, image 19). There is adjacent nodularity of the right pulmonary apex (series 2, image 13), most discrete nodular component measuring up to 1.1 cm (series 2, image 14). Heterogeneous airspace opacity of the perihilar left upper lobe (series 2, image 36). No pleural effusion or pneumothorax. Musculoskeletal: Status post bilateral mastectomy. CT ABDOMEN PELVIS FINDINGS Hepatobiliary: No solid liver abnormality is seen. No gallstones, gallbladder wall thickening, or biliary dilatation. Pancreas: Unremarkable. No pancreatic ductal dilatation or surrounding inflammatory changes. Spleen:  Normal in size without significant abnormality. Adrenals/Urinary Tract: Adrenal glands are unremarkable. Nonobstructive calculus of the inferior pole of the right kidney. Bladder is unremarkable. Stomach/Bowel: Stomach is within normal limits. 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 Eccentric wall thickening of the rectosigmoid colon (series 2, image 105). Vascular/Lymphatic: No significant vascular findings are present. There are prominent subcentimeter lymph nodes in the sigmoid mesocolon measuring up to 4 mm (series 2, image 101, series 4, image 68). No overly enlarged abdominal or pelvic lymph nodes. Reproductive: No mass or other abnormality. Other: No abdominal wall hernia or abnormality. No abdominopelvic ascites. Musculoskeletal: No acute or significant osseous findings. IMPRESSION: 1. There is eccentric wall thickening of the rectosigmoid colon, in keeping with mass identified and biopsied by colonoscopy. 2. There are prominent subcentimeter lymph  nodes in the sigmoid mesocolon, suspicious for nodal metastatic disease. There is no other lymphadenopathy in the abdomen or pelvis. 3. There are multiple small bilateral pulmonary nodules measuring up to 4 mm, nonspecific although very suspicious for pulmonary metastatic disease. Given history of breast malignancy, previous imaging of the chest would be very helpful to assess for stability. This report may be addended for comparison if prior imaging can be obtained. 4. Status post bilateral mastectomy with irregular subpleural opacity of the anterior right upper lobe, consistent with history of breast malignancy and radiation fibrosis. 5. There is adjacent nodularity of the right pulmonary apex, the largest discrete nodular component measuring up to 1.1 cm. This is more favored infectious or inflammatory although nonspecific and metastatic disease is not excluded. As as above, prior imaging would be very helpful to assess. 6. Small focus of nonspecific infectious or inflammatory airspace disease of the perihilar left upper lobe. Electronically Signed   By: Eddie Candle M.D.   On: 09/23/2019 16:56   CT ABDOMEN PELVIS W CONTRAST  Result Date: 09/23/2019 CLINICAL DATA:  Colorectal cancer staging, rectosigmoid mass identified by colonoscopy, history of breast cancer pain status post bilateral mastectomy EXAM: CT CHEST, ABDOMEN, AND PELVIS WITH CONTRAST TECHNIQUE: Multidetector CT imaging of the chest, abdomen and pelvis was performed following the standard protocol during bolus administration of intravenous contrast. CONTRAST:  189mL OMNIPAQUE IOHEXOL 300 MG/ML SOLN, additional oral enteric contrast COMPARISON:  None. FINDINGS: CT CHEST FINDINGS Cardiovascular: Aortic atherosclerosis. Normal heart size. No pericardial effusion. Mediastinum/Nodes: No enlarged mediastinal, hilar, or axillary lymph nodes. Thyroid gland, trachea, and esophagus demonstrate no significant findings. Lungs/Pleura: There are multiple small  bilateral pulmonary nodules, the largest in the left lower lobe measuring 4 mm (series 2, image 43). There is irregular subpleural opacity of the anterior right upper lobe (series 2, image 19). There is adjacent nodularity of the right pulmonary apex (series 2, image 13), most discrete nodular component measuring up to 1.1 cm (series 2, image 14). Heterogeneous airspace opacity of the perihilar left upper lobe (series 2, image 36). No pleural effusion or pneumothorax. Musculoskeletal: Status post bilateral mastectomy. CT ABDOMEN PELVIS FINDINGS Hepatobiliary: No solid liver abnormality is seen. No gallstones, gallbladder wall thickening, or biliary dilatation. Pancreas: Unremarkable. No pancreatic ductal dilatation or surrounding inflammatory changes. Spleen: Normal in size without significant abnormality. Adrenals/Urinary Tract: Adrenal glands are unremarkable. Nonobstructive calculus of the inferior pole of the right kidney. Bladder is unremarkable. Stomach/Bowel: Stomach is within normal limits. 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 Eccentric wall thickening of the rectosigmoid colon (series 2, image 105). Vascular/Lymphatic: No significant vascular findings are  present. There are prominent subcentimeter lymph nodes in the sigmoid mesocolon measuring up to 4 mm (series 2, image 101, series 4, image 68). No overly enlarged abdominal or pelvic lymph nodes. Reproductive: No mass or other abnormality. Other: No abdominal wall hernia or abnormality. No abdominopelvic ascites. Musculoskeletal: No acute or significant osseous findings. IMPRESSION: 1. There is eccentric wall thickening of the rectosigmoid colon, in keeping with mass identified and biopsied by colonoscopy. 2. There are prominent subcentimeter lymph nodes in the sigmoid mesocolon, suspicious for nodal metastatic disease. There is no other lymphadenopathy in the abdomen or pelvis. 3. There are multiple small bilateral  pulmonary nodules measuring up to 4 mm, nonspecific although very suspicious for pulmonary metastatic disease. Given history of breast malignancy, previous imaging of the chest would be very helpful to assess for stability. This report may be addended for comparison if prior imaging can be obtained. 4. Status post bilateral mastectomy with irregular subpleural opacity of the anterior right upper lobe, consistent with history of breast malignancy and radiation fibrosis. 5. There is adjacent nodularity of the right pulmonary apex, the largest discrete nodular component measuring up to 1.1 cm. This is more favored infectious or inflammatory although nonspecific and metastatic disease is not excluded. As as above, prior imaging would be very helpful to assess. 6. Small focus of nonspecific infectious or inflammatory airspace disease of the perihilar left upper lobe. Electronically Signed   By: Eddie Candle M.D.   On: 09/23/2019 16:56    Microbiology: Recent Results (from the past 240 hour(s))  SARS Coronavirus 2 by RT PCR (hospital order, performed in [facility] hospital lab) Nasopharyngeal Nasopharyngeal Swab     Status: None   Collection Time: 09/20/19  9:25 PM   Specimen: Nasopharyngeal Swab  Result Value Ref Range Status   SARS Coronavirus 2 NEGATIVE NEGATIVE Final    Comment: (NOTE) SARS-CoV-2 target nucleic acids are NOT DETECTED. The SARS-CoV-2 RNA is generally detectable in upper and lower respiratory specimens during the acute phase of infection. The lowest concentration of SARS-CoV-2 viral copies this assay can detect is 250 copies / mL. A negative result does not preclude SARS-CoV-2 infection and should not be used as the sole basis for treatment or other patient management decisions.  A negative result may occur with improper specimen collection / handling, submission of specimen other than nasopharyngeal swab, presence of viral mutation(s) within the areas targeted by this assay, and  inadequate number of viral copies (<250 copies / mL). A negative result must be combined with clinical observations, patient history, and epidemiological information. Fact Sheet for Patients:   StrictlyIdeas.no Fact Sheet for Healthcare Providers: BankingDealers.co.za This test is not yet approved or cleared  by the [place] FDA and has been authorized for detection and/or diagnosis of SARS-CoV-2 by FDA under an Emergency Use Authorization (EUA).  This EUA will remain in effect (meaning this test can be used) for the duration of the COVID-19 declaration under Section 564(b)(1) of the Act, 21 U.S.C. section 360bbb-3(b)(1), unless the authorization is terminated or revoked sooner. Performed at [facility], [address] [place], [place] [address]      Labs: Basic Metabolic Panel: Recent Labs  Lab 09/20/19 1736 09/21/19 0100 09/23/19 0523  NA 137 139 141  K 3.8 3.6 4.0  CL 105 107 109  CO2 25 25 23   GLUCOSE 112* 115* 108*  BUN 24* 24* 13  CREATININE 0.72 0.61 0.65  CALCIUM 8.7* 8.4* 8.3*   Liver  Function Tests: Recent Labs  Lab 09/20/19 1736  AST 37  ALT 43  ALKPHOS 51  BILITOT 0.4  PROT 5.6*  ALBUMIN 3.6   No results for input(s): LIPASE, AMYLASE in the last 168 hours. No results for input(s): AMMONIA in the last 168 hours. CBC: Recent Labs  Lab 09/20/19 1736 09/21/19 0716 09/22/19 0537 09/23/19 0523 09/24/19 0545  WBC 5.5  --  3.6* 4.0 4.0  NEUTROABS 3.7  --  2.1 2.7 3.0  HGB 7.4* 8.4* 7.9* 9.4* 8.0*  HCT 22.1* 25.6* 24.4* 29.0* 25.8*  MCV 94.8  --  96.1 93.9 98.1  PLT 227  --  228 224 189   Cardiac Enzymes: No results for input(s): CKTOTAL, CKMB, CKMBINDEX, TROPONINI in the last 168 hours. BNP: BNP (last 3 results) No results for input(s): BNP in the last 8760 hours.  ProBNP (last 3 results) No results for input(s): PROBNP in the last 8760 hours.  CBG: No results for input(s):  GLUCAP in the last 168 hours.     Signed:  Kayleen Memos, MD [facility] 09/24/2019, 10:41 AM

## 2019-09-24 NOTE — Progress Notes (Signed)
[facility] Gastroenterology Progress Note  Sonia Sandoval 69 y.o. Aug 28, 1950  CC: Anemia   Subjective: Patient seen and examined at bedside. Preliminary biopsy finding of adenocarcinoma of the colon discussed. She denies any acute issues today. Wants to go home  ROS : Afebrile. Denies abdominal pain.   Objective: Vital signs in last 24 hours: Vitals:   09/23/19 2035 09/24/19 0515  BP: 125/67 (!) 110/57  Pulse: 83 75  Resp: 16 16  Temp: 98.4 F (36.9 C) 98.1 F (36.7 C)  SpO2: 99% 98%    Physical Exam:  General. Well developed. Not in acute distress No visible respiratory distress Alert and oriented x3 Mood and affect normal  Lab Results: Recent Labs    09/23/19 0523  NA 141  K 4.0  CL 109  CO2 23  GLUCOSE 108*  BUN 13  CREATININE 0.65  CALCIUM 8.3*   No results for input(s): AST, ALT, ALKPHOS, BILITOT, PROT, ALBUMIN in the last 72 hours. Recent Labs    09/23/19 0523 09/24/19 0545  WBC 4.0 4.0  NEUTROABS 2.7 3.0  HGB 9.4* 8.0*  HCT 29.0* 25.8*  MCV 93.9 98.1  PLT 224 189   No results for input(s): LABPROT, INR in the last 72 hours.    Assessment/Plan: -Symptomatic anemia. EGD yesterday showed tiny gastric ulcer. Colonoscopy showed large rectosigmoid mass. Preliminary pathology report positive for adenocarcinoma. -NSAID use. -History of breast cancer  Recommendations ------------------------- -CT chest, abdomen and pelvis report as well as preliminary pathology report discussed with patient and patient's husband. She has been seen by oncology as well as surgical team.  -Appreciate oncology and surgery input. -Recommend daily iron supplement for anemia. Okay to use MiraLAX for constipation. -Recommend once a day pantoprazole for gastric ulcer. Avoid NSAIDs.  -No further inpatient work-up planned. Okay to discharge from GI standpoint. Follow-up in GI clinic in 3 months. GI will sign off. Call us back if needed   Otis Brace MD, [place] 09/24/2019,  9:47 AM  Contact #  [phone]

## 2019-09-25 ENCOUNTER — Telehealth: Payer: Self-pay | Admitting: *Deleted

## 2019-09-25 LAB — SURGICAL PATHOLOGY

## 2019-09-25 NOTE — Telephone Encounter (Addendum)
Patient left VM that she was instructed upon her discharge from hospital yesterday to call and get a new patient appointment with Dr. Benay Spice. Her AVS also said to follow up with Dr. Jana Hakim (Breast CA and last visit 02/21/19 w/return in 1 year). Colonoscopy 09/23/19-adeno of colon. Has appointment with Dr. Dema Severin on 10/09/19. Forwarded information to GI nurse navigator.

## 2019-09-26 ENCOUNTER — Encounter: Payer: Self-pay | Admitting: Hematology

## 2019-09-26 ENCOUNTER — Other Ambulatory Visit: Payer: Self-pay | Admitting: *Deleted

## 2019-09-26 ENCOUNTER — Inpatient Hospital Stay: Payer: BC Managed Care – PPO

## 2019-09-26 ENCOUNTER — Other Ambulatory Visit: Payer: Self-pay

## 2019-09-26 ENCOUNTER — Inpatient Hospital Stay: Payer: BC Managed Care – PPO | Attending: Hematology | Admitting: Hematology

## 2019-09-26 DIAGNOSIS — Z170 Estrogen receptor positive status [ER+]: Secondary | ICD-10-CM | POA: Diagnosis not present

## 2019-09-26 DIAGNOSIS — K319 Disease of stomach and duodenum, unspecified: Secondary | ICD-10-CM | POA: Insufficient documentation

## 2019-09-26 DIAGNOSIS — K644 Residual hemorrhoidal skin tags: Secondary | ICD-10-CM | POA: Insufficient documentation

## 2019-09-26 DIAGNOSIS — Z9013 Acquired absence of bilateral breasts and nipples: Secondary | ICD-10-CM | POA: Diagnosis not present

## 2019-09-26 DIAGNOSIS — C50511 Malignant neoplasm of lower-outer quadrant of right female breast: Secondary | ICD-10-CM | POA: Diagnosis present

## 2019-09-26 DIAGNOSIS — R140 Abdominal distension (gaseous): Secondary | ICD-10-CM | POA: Diagnosis not present

## 2019-09-26 DIAGNOSIS — D500 Iron deficiency anemia secondary to blood loss (chronic): Secondary | ICD-10-CM

## 2019-09-26 DIAGNOSIS — I700 Atherosclerosis of aorta: Secondary | ICD-10-CM | POA: Insufficient documentation

## 2019-09-26 DIAGNOSIS — J449 Chronic obstructive pulmonary disease, unspecified: Secondary | ICD-10-CM | POA: Insufficient documentation

## 2019-09-26 DIAGNOSIS — K922 Gastrointestinal hemorrhage, unspecified: Secondary | ICD-10-CM | POA: Diagnosis not present

## 2019-09-26 DIAGNOSIS — K648 Other hemorrhoids: Secondary | ICD-10-CM | POA: Insufficient documentation

## 2019-09-26 DIAGNOSIS — K449 Diaphragmatic hernia without obstruction or gangrene: Secondary | ICD-10-CM | POA: Insufficient documentation

## 2019-09-26 DIAGNOSIS — D649 Anemia, unspecified: Secondary | ICD-10-CM

## 2019-09-26 DIAGNOSIS — N200 Calculus of kidney: Secondary | ICD-10-CM | POA: Insufficient documentation

## 2019-09-26 DIAGNOSIS — Z803 Family history of malignant neoplasm of breast: Secondary | ICD-10-CM | POA: Insufficient documentation

## 2019-09-26 DIAGNOSIS — Z885 Allergy status to narcotic agent status: Secondary | ICD-10-CM | POA: Diagnosis not present

## 2019-09-26 DIAGNOSIS — Z79899 Other long term (current) drug therapy: Secondary | ICD-10-CM | POA: Insufficient documentation

## 2019-09-26 DIAGNOSIS — Z7289 Other problems related to lifestyle: Secondary | ICD-10-CM | POA: Diagnosis not present

## 2019-09-26 DIAGNOSIS — Z801 Family history of malignant neoplasm of trachea, bronchus and lung: Secondary | ICD-10-CM | POA: Diagnosis not present

## 2019-09-26 DIAGNOSIS — M8580 Other specified disorders of bone density and structure, unspecified site: Secondary | ICD-10-CM | POA: Insufficient documentation

## 2019-09-26 DIAGNOSIS — C187 Malignant neoplasm of sigmoid colon: Secondary | ICD-10-CM | POA: Diagnosis not present

## 2019-09-26 DIAGNOSIS — K921 Melena: Secondary | ICD-10-CM | POA: Diagnosis not present

## 2019-09-26 DIAGNOSIS — M549 Dorsalgia, unspecified: Secondary | ICD-10-CM | POA: Diagnosis not present

## 2019-09-26 LAB — CBC
HCT: 27.1 % — ABNORMAL LOW (ref 36.0–46.0)
Hemoglobin: 8.8 g/dL — ABNORMAL LOW (ref 12.0–15.0)
MCH: 31.1 pg (ref 26.0–34.0)
MCHC: 32.5 g/dL (ref 30.0–36.0)
MCV: 95.8 fL (ref 80.0–100.0)
Platelets: 256 10*3/uL (ref 150–400)
RBC: 2.83 MIL/uL — ABNORMAL LOW (ref 3.87–5.11)
RDW: 16.1 % — ABNORMAL HIGH (ref 11.5–15.5)
WBC: 3.9 10*3/uL — ABNORMAL LOW (ref 4.0–10.5)
nRBC: 0 % (ref 0.0–0.2)

## 2019-09-26 LAB — CMP (CANCER CENTER ONLY)
ALT: 40 U/L (ref 0–44)
AST: 39 U/L (ref 15–41)
Albumin: 3.8 g/dL (ref 3.5–5.0)
Alkaline Phosphatase: 50 U/L (ref 38–126)
Anion gap: 9 (ref 5–15)
BUN: 14 mg/dL (ref 8–23)
CO2: 27 mmol/L (ref 22–32)
Calcium: 9.1 mg/dL (ref 8.9–10.3)
Chloride: 106 mmol/L (ref 98–111)
Creatinine: 0.78 mg/dL (ref 0.44–1.00)
GFR, Est AFR Am: >60 mL/min (ref >60)
GFR, Estimated: >60 mL/min (ref >60)
Glucose, Bld: 107 mg/dL — ABNORMAL HIGH (ref 70–99)
Potassium: 3.7 mmol/L (ref 3.5–5.1)
Sodium: 142 mmol/L (ref 135–145)
Total Bilirubin: 0.4 mg/dL (ref 0.3–1.2)
Total Protein: 6.1 g/dL — ABNORMAL LOW (ref 6.5–8.1)

## 2019-09-26 LAB — ABO/RH: ABO/RH(D): A POS

## 2019-09-26 LAB — TYPE AND SCREEN
ABO/RH(D): A POS
Antibody Screen: NEGATIVE

## 2019-09-26 NOTE — Progress Notes (Signed)
Met with patient and her husband David today at initial GI medical oncology appointment with Dr. Feng. I explained my role as nurse navigator and they were given my card with my direct phone number and encouraged to call with any questions or concerns.  They verbalized an understanding that the first thing that needs to be done is to obtain a PET scan which will be scheduled once authorized.     

## 2019-09-26 NOTE — Progress Notes (Signed)
[place]   [phone] Fax:([phone] [place] Note   Patient Care Team: [name], Geraldine as PCP - General (Nurse Practitioner) [name], MD as Consulting Physician (General Surgery) Magrinat, [name], MD as Consulting Physician (Oncology) [name], MD as Consulting Physician (Radiation Oncology) [name], RN as Registered Nurse [name], MD as Referring Physician (Dermatology) [name], RN as Oncology Nurse Navigator [name], MD as Consulting Physician (Hematology)  Date of Service:  09/26/2019   CHIEF COMPLAINTS/PURPOSE OF CONSULTATION:  Newly diagnosed Colon cancer   REFERRING PHYSICIAN:  Referred by Dr Jana Hakim   Oncology History Overview Note  Cancer Staging Malignant neoplasm of lower-outer quadrant of right breast of female, estrogen receptor positive ([place]) Staging form: Breast, AJCC 8th Edition - Clinical: Stage IIB (cT2, cN1, cM0, G3, ER+, PR+, HER2-) - Unsigned - Pathologic: Stage IIA (pT2, pN1(sn), cM0, G3, ER+, PR+, HER2-) - Unsigned    Malignant neoplasm of lower-outer quadrant of right breast of female, estrogen receptor positive ([place])  10/23/2017 Initial Diagnosis    right breast lower outer quadrant biopsy 10/23/2017 for a clinical T2 N1, stage IIB invasive ductal carcinoma, grade 3, estrogen and progesterone receptor positive, HER-2 not amplified, with an MIB-1 of 20%   11/07/2017 Miscellaneous   Mammaprint on was read as low risk, predicting no significant chemotherapy benefit with a 5-year distant disease-free survival in the 97-98% range with hormone therapy alone   12/05/2017 Surgery   status post bilateral mastectomies on 12/05/2017 showing             (a) on the left, no evidence of malignancy             (b) on the right, a pT2 pN1, stage IIB invasive ductal carcinoma, grade 3, with negative margins. By Dr Brantley Stage   01/15/2018 - 02/28/2018 Radiation Therapy   adjuvant radiation 01/15/2018 - 02/28/2018 with Dr Lisbeth Renshaw              Site/dose: The patient initially received a dose of 50.4 Gy in 28 fractions to the chest wall and supraclavicular region. This was delivered using a 3-D conformal, 4 field technique. The patient then received a boost to the mastectomy scar. This delivered an additional 10 Gy in 5 fractions using an en face electron field. The total dose was 60.4 Gy.    04/01/2018 -  Anti-estrogen oral therapy   started anastrozole              (a) bone density 03/07/2018 with a T score of -1.8   Cancer of sigmoid colon ([place])  09/23/2019 Tumor Marker   Baseline CEA 1.4 on 09/23/19    09/23/2019 Procedure   Colonoscopy and Upper endoscopy with Dr Alessandra Bevels 09/23/19  Upper Endoscopy Impression - Z-line regular, 35 cm from the incisors. - Small hiatal hernia. - Non-bleeding gastric ulcer. Biopsied. - Hematin (altered blood/coffee-ground-like material) in the gastric body. - Normal duodenal bulb, first portion of the duodenum and second portion of - Normal duodenal bulb, first portion of the duodenum and second portion of the duodenum.  Colonoscopy Impression - Preparation of the colon was poor. - Perianal skin tags found on perianal exam. - Likely malignant tumor in the recto-sigmoid colon. Biopsied. Tattooed. - Internal hemorrhoids.   09/23/2019 Initial Biopsy   FINAL MICROSCOPIC DIAGNOSIS: 09/23/19 A. STOMACH, ULCER, BIOPSY:  - Gastric antral mucosa with mild nonspecific reactive gastropathy  - Warthin Starry stain is negative for Helicobacter  pylori   B. COLON, RECTOSIGMOID, MASS, BIOPSY:  - Adenocarcinoma, see comment  COMMENT:   B.  Immunohistochemical stains for MMR-related proteins are pending and  will be reported in an addendum. Dr. Saralyn Pilar reviewed the case and  concurs with the diagnosis. Dr. Alessandra Bevels was notified on 09/24/2019.     ADDENDUM:  Mismatch Repair Protein (IHC)  SUMMARY INTERPRETATION: NORMAL  There is  preserved expression of the major MMR proteins. There is a very  low probability that microsatellite instability (MSI) is present.  However, certain clinically significant MMR protein mutations may result  in preservation of nuclear expression. It is recommended that the  preservation of protein expression be correlated with molecular based  MSI testing.   IHC EXPRESSION RESULTS  TEST           RESULT  MLH1:          Preserved nuclear expression  MSH2:          Preserved nuclear expression  MSH6:          Preserved nuclear expression  PMS2:          Preserved nuclear expression    09/23/2019 Imaging   CT CAP W Contrast 09/23/19  IMPRESSION: 1. There is eccentric wall thickening of the rectosigmoid colon, in keeping with mass identified and biopsied by colonoscopy.   2. There are prominent subcentimeter lymph nodes in the sigmoid mesocolon, suspicious for nodal metastatic disease. There is no other lymphadenopathy in the abdomen or pelvis.   3. There are multiple small bilateral pulmonary nodules measuring up to 4 mm, nonspecific although very suspicious for pulmonary metastatic disease. Given history of breast malignancy, previous imaging of the chest would be very helpful to assess for stability. This report may be addended for comparison if prior imaging can be obtained.   4. Status post bilateral mastectomy with irregular subpleural opacity of the anterior right upper lobe, consistent with history of breast malignancy and radiation fibrosis.   5. There is adjacent nodularity of the right pulmonary apex, the largest discrete nodular component measuring up to 1.1 cm. This is more favored infectious or inflammatory although nonspecific and metastatic disease is not excluded. As as above, prior imaging would be very helpful to assess.   6. Small focus of nonspecific infectious or inflammatory airspace disease of the perihilar left upper lobe.   09/26/2019 Initial Diagnosis    Cancer of sigmoid colon ([place])      HISTORY OF PRESENTING ILLNESS:  Sonia Sandoval 69 y.o. female is a here because of newly diagnosed colon cancer. The patient was referred by Dr Jana Hakim. The patient presents to the clinic today accompanied by her husband.   She was having SOB and significant fatigue with standing for 3-4 days last week and for 2 weeks she had 3 episodes of mild GI bleeding. She felt better after blood transfusion on 09/26/19. She notes she has black stool the night she took colonoscopy prep. She had colonoscopy on 52/4/21. She was diagnosed with colon cancer as seen on pathology. She plans to see Surgeon Dr Dema Severin on 10/09/19. She feels better after hospital discharge. She denies abdominal pain and coffee grounds in her stool. She is on Protonix for her recent stomach ulcer. She is on oral iron due to her GI bleeding.   She continues to see Dr Jana Hakim for her breast cancer. She is on AI. She notes her stomach has been mildly bloated in the past 2 weeks.  She notes 30  years ago she had no GI bleeding when she was found to have bleeding stomach ulcer then.   Socially she is married with 4 adult children. 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 She drinks from 6-12 ounce glasses of wine a day. She notes she gets very sick on Codeine products.  They have a PMHx of Osteopenia, Allergies. She had spinal surgery in the past. She still has back pain from this, on Tylenol. Her father had lung cancer. Her maternal aunt had breast cancer. Her Maternal cousin had breast cancer.    REVIEW OF SYSTEMS:    Constitutional: Denies fevers, chills or abnormal night sweats Eyes: Denies blurriness of vision, double vision or watery eyes Ears, nose, mouth, throat, and face: Denies mucositis or sore throat Respiratory: Denies cough, dyspnea or wheezes Cardiovascular: Denies palpitation, chest discomfort or lower extremity swelling Gastrointestinal:  Denies nausea, heartburn or change in bowel habits (+)  Abdominal bloating (+) Coffee ground stool Skin: Denies abnormal skin rashes MSK: (+) Back pain  Lymphatics: Denies new lymphadenopathy or easy bruising Neurological:Denies numbness, tingling or new weaknesses Behavioral/Psych: Mood is stable, no new changes  All other systems were reviewed with the patient and are negative.   MEDICAL HISTORY:  Past Medical History:  Diagnosis Date  . Allergies 1995   Per patient report 11/14/18  . Breast cancer ([place])   . Breast cancer, right breast ([place]) 09/2017   S/P mastectomy 12/05/2017  . Headache 1968   per patient report, started as a teenager  . 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  . Osteopenia 03/2018   Seen on DEXA Scan  . PONV (postoperative nausea and vomiting)     SURGICAL HISTORY: Past Surgical History:  Procedure Laterality Date  . BACK SURGERY  2003   Patient will occassional back pain since 2003  . BIOPSY  09/23/2019   Procedure: BIOPSY;  Surgeon: Otis Brace, MD;  Location: [facility];  Service: Gastroenterology;;  EGD and COLON  . BREAST BIOPSY Right 09/2017  . [place]; 1992  . COLONOSCOPY WITH PROPOFOL N/A 09/23/2019   Procedure: COLONOSCOPY WITH PROPOFOL and EGD;  Surgeon: Otis Brace, MD;  Location: [facility];  Service: Gastroenterology;  Laterality: N/A;  . DILATION AND CURETTAGE OF UTERUS    . ESOPHAGOGASTRODUODENOSCOPY N/A 09/23/2019   Procedure: ESOPHAGOGASTRODUODENOSCOPY (EGD);  Surgeon: Otis Brace, MD;  Location: [facility];  Service: Gastroenterology;  Laterality: N/A;  . LUMBAR [place] SURGERY  2003  . MASTECTOMY Left 12/05/2017   PROPHYLACTIC MASTECTOMY  . MASTECTOMY COMPLETE / SIMPLE W/ SENTINEL NODE BIOPSY Right 12/05/2017   WITH RADIOACTIVE SEED TARGETED RIGHT AXIILARY LYMPH NODE EXCISION AND RIGHT SENTINEL LYMPH NODE BIOPSY,   . MASTECTOMY WITH RADIOACTIVE SEED  GUIDED EXCISION AND AXILLARY SENTINEL LYMPH NODE BIOPSY Bilateral 12/05/2017   Procedure: RIGHT SIMPLE MASTECTOMY WITH RADIOACTIVE SEED TARGETED RIGHT AXIILARY LYMPH NODE EXCISION AND RIGHT SENTINEL LYMPH NODE BIOPSY, LEFT PROPHYLACTIC MASTECTOMY;  Surgeon: [name], MD;  Location: [facility];  Service: General;  Laterality: Bilateral;  . SUBMUCOSAL TATTOO INJECTION  09/23/2019   Procedure: SUBMUCOSAL TATTOO INJECTION;  Surgeon: Otis Brace, MD;  Location: [facility];  Service: Gastroenterology;;    SOCIAL HISTORY: Social History   Socioeconomic History  . Marital status: Married    Spouse name: Not on file  . Number of children: 4  . Years of education: Not on file  .  Highest education level: Not on file  Occupational History  . Not on file  Tobacco Use  . Smoking status: Never Smoker  . Smokeless tobacco: Never Used  Substance and Sexual Activity  . Alcohol use: Yes    Alcohol/week: 14.0 standard drinks    Types: 14 Glasses of wine per week  . Drug use: Never  . Sexual activity: Not on file  Other Topics Concern  . Not on file  Social History Narrative   12-19-17 Unable to ask abuse questions husband with her today.   Social Determinants of Health   Financial Resource Strain:   . Difficulty of Paying Living Expenses:   Food Insecurity:   . Worried About Charity fundraiser in the Last Year:   . Arboriculturist in the Last Year:   Transportation Needs:   . Film/video editor (Medical):   Marland Kitchen Lack of Transportation (Non-Medical):   Physical Activity:   . Days of Exercise per Week:   . Minutes of Exercise per Session:   Stress:   . Feeling of Stress :   Social Connections:   . Frequency of Communication with Friends and Family:   . Frequency of Social Gatherings with Friends and Family:   . Attends Religious Services:   . Active Member of Clubs or Organizations:   . Attends Archivist Meetings:   Marland Kitchen Marital Status:   Intimate Partner Violence:     . Fear of Current or Ex-Partner:   . Emotionally Abused:   Marland Kitchen Physically Abused:   . Sexually Abused:     FAMILY HISTORY: Family History  Problem Relation Age of Onset  . Lung cancer Father   . Cancer Maternal Aunt 60       breast cancer   . Cancer Cousin        breast cancer     ALLERGIES:  is allergic to codeine.  MEDICATIONS:  Current Outpatient Medications  Medication Sig Dispense Refill  . calcium citrate (CALCITRATE - DOSED IN MG ELEMENTAL CALCIUM) 950 (200 Ca) MG tablet Take 200 mg of elemental calcium by mouth daily.    Marland Kitchen anastrozole (ARIMIDEX) 1 MG tablet Take 1 tablet (1 mg total) by mouth daily. 90 tablet 4  . Cholecalciferol (VITAMIN D3) 25 MCG (1000 UT) CHEW Chew 50 mcg by mouth daily.     . Coenzyme Q10 (COQ10 PO) Take 1 tablet by mouth daily.    . ferrous sulfate 325 (65 FE) MG tablet Take 1 tablet (325 mg total) by mouth daily with breakfast. 90 tablet 0  . Multiple Vitamins-Minerals (CENTRUM SILVER 50+WOMEN PO) Take 1 tablet by mouth.    . pantoprazole (PROTONIX) 40 MG tablet Take 1 tablet (40 mg total) by mouth daily. 90 tablet 0  . pyridOXINE (VITAMIN B-6) 100 MG tablet Take 100 mg by mouth daily.    . vitamin C (ASCORBIC ACID) 500 MG tablet Take 500 mg by mouth daily.    . Vitamin E 180 MG CAPS Take 1 tablet by mouth daily.     No current facility-administered medications for this visit.    PHYSICAL EXAMINATION: ECOG PERFORMANCE STATUS: 0 - Asymptomatic  Vitals:   09/26/19 1436  BP: (!) 113/48  Pulse: 75  Resp: 20  Temp: 97.7 F (36.5 C)  SpO2: 100%   Filed Weights   09/26/19 1436  Weight: 144 lb 8 oz (65.5 kg)    GENERAL:alert, no distress and comfortable SKIN: skin color, texture, turgor are normal,  no rashes or significant lesions EYES: normal, Conjunctiva are pink and non-injected, sclera clear  NECK: supple, thyroid normal size, non-tender, without nodularity LYMPH:  no palpable lymphadenopathy in the cervical, axillary  LUNGS: clear  to auscultation and percussion with normal breathing effort HEART: regular rate & rhythm and no murmurs and no lower extremity edema ABDOMEN:abdomen soft, non-tender and normal bowel sounds (+) Abdominal gas  Musculoskeletal:no cyanosis of digits and no clubbing  NEURO: alert & oriented x 3 with fluent speech, no focal motor/sensory deficits  LABORATORY DATA:  I have reviewed the data as listed CBC Latest Ref Rng & Units 09/26/2019 09/24/2019 09/23/2019  WBC 4.0 - 10.5 K/uL 3.9(L) 4.0 4.0  Hemoglobin 12.0 - 15.0 g/dL 8.8(L) 8.0(L) 9.4(L)  Hematocrit 36.0 - 46.0 % 27.1(L) 25.8(L) 29.0(L)  Platelets 150 - 400 K/uL 256 189 224    CMP Latest Ref Rng & Units 09/26/2019 09/23/2019 09/21/2019  Glucose 70 - 99 mg/dL 107(H) 108(H) 115(H)  BUN 8 - 23 mg/dL 14 13 24(H)  Creatinine 0.44 - 1.00 mg/dL 0.78 0.65 0.61  Sodium 135 - 145 mmol/L 142 141 139  Potassium 3.5 - 5.1 mmol/L 3.7 4.0 3.6  Chloride 98 - 111 mmol/L 106 109 107  CO2 22 - 32 mmol/L _0 Calcium 8.9 - 10.3 mg/dL 9.1 8.3(L) 8.4(L)  Total Protein 6.5 - 8.1 g/dL 6.1(L) - -  Total Bilirubin 0.3 - 1.2 mg/dL 0.4 - -  Alkaline Phos 38 - 126 U/L 50 - -  AST 15 - 41 U/L 39 - -  ALT 0 - 44 U/L 40 - -     RADIOGRAPHIC STUDIES: I have personally reviewed the radiological images as listed and agreed with the findings in the report. DG Chest 2 View  Result Date: 09/20/2019 CLINICAL DATA:  Shortness of breath on exertion EXAM: CHEST - 2 VIEW COMPARISON:  None. FINDINGS: Cardiac shadow is within normal limits. The lungs are hyperinflated consistent with COPD. Bilateral breast surgery is noted. The lungs are clear. Mild apical scarring is noted. No bony abnormality is seen. IMPRESSION: COPD without acute abnormality. Electronically Signed   By: Inez Catalina M.D.   On: 09/20/2019 18:04   CT CHEST W CONTRAST  Result Date: 09/23/2019 CLINICAL DATA:  Colorectal cancer staging, rectosigmoid mass identified by colonoscopy, history of breast cancer  pain status post bilateral mastectomy EXAM: CT CHEST, ABDOMEN, AND PELVIS WITH CONTRAST TECHNIQUE: Multidetector CT imaging of the chest, abdomen and pelvis was performed following the standard protocol during bolus administration of intravenous contrast. CONTRAST:  170m OMNIPAQUE IOHEXOL 300 MG/ML SOLN, additional oral enteric contrast COMPARISON:  None. FINDINGS: CT CHEST FINDINGS Cardiovascular: Aortic atherosclerosis. Normal heart size. No pericardial effusion. Mediastinum/Nodes: No enlarged mediastinal, hilar, or axillary lymph nodes. Thyroid gland, trachea, and esophagus demonstrate no significant findings. Lungs/Pleura: There are multiple small bilateral pulmonary nodules, the largest in the left lower lobe measuring 4 mm (series 2, image 43). There is irregular subpleural opacity of the anterior right upper lobe (series 2, image 19). There is adjacent nodularity of the right pulmonary apex (series 2, image 13), most discrete nodular component measuring up to 1.1 cm (series 2, image 14). Heterogeneous airspace opacity of the perihilar left upper lobe (series 2, image 36). No pleural effusion or pneumothorax. Musculoskeletal: Status post bilateral mastectomy. CT ABDOMEN PELVIS FINDINGS Hepatobiliary: No solid liver abnormality is seen. No gallstones, gallbladder wall thickening, or biliary dilatation. Pancreas: Unremarkable. No pancreatic ductal dilatation or surrounding inflammatory changes. Spleen: Normal  in size without significant abnormality. Adrenals/Urinary Tract: Adrenal glands are unremarkable. Nonobstructive calculus of the inferior pole of the right kidney. Bladder is unremarkable. Stomach/Bowel: Stomach is within normal limits. 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 Eccentric wall thickening of the rectosigmoid colon (series 2, image 105). Vascular/Lymphatic: No significant vascular findings are present. There are prominent subcentimeter lymph nodes in the  sigmoid mesocolon measuring up to 4 mm (series 2, image 101, series 4, image 68). No overly enlarged abdominal or pelvic lymph nodes. Reproductive: No mass or other abnormality. Other: No abdominal wall hernia or abnormality. No abdominopelvic ascites. Musculoskeletal: No acute or significant osseous findings. IMPRESSION: 1. There is eccentric wall thickening of the rectosigmoid colon, in keeping with mass identified and biopsied by colonoscopy. 2. There are prominent subcentimeter lymph nodes in the sigmoid mesocolon, suspicious for nodal metastatic disease. There is no other lymphadenopathy in the abdomen or pelvis. 3. There are multiple small bilateral pulmonary nodules measuring up to 4 mm, nonspecific although very suspicious for pulmonary metastatic disease. Given history of breast malignancy, previous imaging of the chest would be very helpful to assess for stability. This report may be addended for comparison if prior imaging can be obtained. 4. Status post bilateral mastectomy with irregular subpleural opacity of the anterior right upper lobe, consistent with history of breast malignancy and radiation fibrosis. 5. There is adjacent nodularity of the right pulmonary apex, the largest discrete nodular component measuring up to 1.1 cm. This is more favored infectious or inflammatory although nonspecific and metastatic disease is not excluded. As as above, prior imaging would be very helpful to assess. 6. Small focus of nonspecific infectious or inflammatory airspace disease of the perihilar left upper lobe. Electronically Signed   By: Eddie Candle M.D.   On: 09/23/2019 16:56   CT ABDOMEN PELVIS W CONTRAST  Result Date: 09/23/2019 CLINICAL DATA:  Colorectal cancer staging, rectosigmoid mass identified by colonoscopy, history of breast cancer pain status post bilateral mastectomy EXAM: CT CHEST, ABDOMEN, AND PELVIS WITH CONTRAST TECHNIQUE: Multidetector CT imaging of the chest, abdomen and pelvis was  performed following the standard protocol during bolus administration of intravenous contrast. CONTRAST:  170m OMNIPAQUE IOHEXOL 300 MG/ML SOLN, additional oral enteric contrast COMPARISON:  None. FINDINGS: CT CHEST FINDINGS Cardiovascular: Aortic atherosclerosis. Normal heart size. No pericardial effusion. Mediastinum/Nodes: No enlarged mediastinal, hilar, or axillary lymph nodes. Thyroid gland, trachea, and esophagus demonstrate no significant findings. Lungs/Pleura: There are multiple small bilateral pulmonary nodules, the largest in the left lower lobe measuring 4 mm (series 2, image 43). There is irregular subpleural opacity of the anterior right upper lobe (series 2, image 19). There is adjacent nodularity of the right pulmonary apex (series 2, image 13), most discrete nodular component measuring up to 1.1 cm (series 2, image 14). Heterogeneous airspace opacity of the perihilar left upper lobe (series 2, image 36). No pleural effusion or pneumothorax. Musculoskeletal: Status post bilateral mastectomy. CT ABDOMEN PELVIS FINDINGS Hepatobiliary: No solid liver abnormality is seen. No gallstones, gallbladder wall thickening, or biliary dilatation. Pancreas: Unremarkable. No pancreatic ductal dilatation or surrounding inflammatory changes. Spleen: Normal in size without significant abnormality. Adrenals/Urinary Tract: Adrenal glands are unremarkable. Nonobstructive calculus of the inferior pole of the right kidney. Bladder is unremarkable. Stomach/Bowel: Stomach is within normal limits. 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 Eccentric wall thickening of the rectosigmoid colon (series 2, image 105). Vascular/Lymphatic: No significant vascular findings are present.  There are prominent subcentimeter lymph nodes in the sigmoid mesocolon measuring up to 4 mm (series 2, image 101, series 4, image 68). No overly enlarged abdominal or pelvic lymph nodes. Reproductive: No mass or other  abnormality. Other: No abdominal wall hernia or abnormality. No abdominopelvic ascites. Musculoskeletal: No acute or significant osseous findings. IMPRESSION: 1. There is eccentric wall thickening of the rectosigmoid colon, in keeping with mass identified and biopsied by colonoscopy. 2. There are prominent subcentimeter lymph nodes in the sigmoid mesocolon, suspicious for nodal metastatic disease. There is no other lymphadenopathy in the abdomen or pelvis. 3. There are multiple small bilateral pulmonary nodules measuring up to 4 mm, nonspecific although very suspicious for pulmonary metastatic disease. Given history of breast malignancy, previous imaging of the chest would be very helpful to assess for stability. This report may be addended for comparison if prior imaging can be obtained. 4. Status post bilateral mastectomy with irregular subpleural opacity of the anterior right upper lobe, consistent with history of breast malignancy and radiation fibrosis. 5. There is adjacent nodularity of the right pulmonary apex, the largest discrete nodular component measuring up to 1.1 cm. This is more favored infectious or inflammatory although nonspecific and metastatic disease is not excluded. As as above, prior imaging would be very helpful to assess. 6. Small focus of nonspecific infectious or inflammatory airspace disease of the perihilar left upper lobe. Electronically Signed   By: Eddie Candle M.D.   On: 09/23/2019 16:56    ASSESSMENT & PLAN:  CHELLI YERKES is a 69 y.o. Caucasian female with a history of    1. Colon Cancer, cTxN1Mx with indeterminate lung lesions -I personally reviewed and discussed her image, colonoscopy report and biopsy path findings with her and her husband. 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  -Her 09/23/19 CT CAP shows known colon mass along with prominent subcentimeter LN in sigmoid mesocolon suspicious for nodal metastatic disease.  Scan also shows multiple small b/l lung nodules up to 51m that are nonspecific but metastasis not ruled out. She also has 1.1cm nodularity at right pulmonary apex, again metastasis needs to be rule out.  -I recommend PET scan to further evaluate her lymphadenopathy and lung lesions. May recommend lung biopsy based on PET results.  -I discussed standard care for early stage colon cancer is surgery which is curative. If she is found to have distant metastasis she will be stage IV and cancer no longer curable at that point. She will consult with surgeon Dr WDema Severinon 10/09/19.  -I suspect she has stage III disease with positive LNs on CT. I discussed toel of adjuvant chemotherapy after surgery to reduce her risk of recurrence. Based on how locally advanced she may need 3-6 months of chemo with FOLFOX q2weeks or CAPOX q3weeks -Will determine her need for chemo and chemo regimen after surgery.  -I discussed the risk of cancer recurrence in the future. I discussed the surveillance plan, which is a physical exam and lab test (including CBC, CMP and CEA) every 3 months for the first 2 years, then every 6-12 months, colonoscopy in one year, and surveillance CT scan every 6-12 month for up to 5 year.  -Labs today show WBC 3.9, Hg 8.8, BG 107, protein 6.1. Her Baseline CEA from 09/23/19 was normal. Physical exam unremarkable with abdominal gas. She notes being bloated these last 2 weeks. I recommend she eat more vegetables and make sure she has BM with stool softeners  if needed.  -F/u after surgery.    2. 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  -Secondary to colon cancer  -She has been having mild GI bleeding in stool in the past 2 weeks and now coffee ground in stool since colonoscopy last week.  -She was anemic and symptomatic with SOB and significant fatigue.  -She required blood transfusion on 09/25/10. She feels better with no more SOB since.  -She is on oral iron currently.  -Hg improved to  8.8 today (09/26/19). Her iron was 64 and Ferritin 12 on 09/21/19 labs. She overall still has moderate anemia.  -I recommend IV iron every 1-2 weeks for 3-5 doses for more direct intervention. I reviewed possible side effects such as allergy reaction. She agreed to proceed.    3. H/o Right Breast Cancer, G3, T2N1, ER+/PR+/HER- -She was diagnosed in 09/2017. She was treated with B/l mastectomies by Dr Brantley Stage and adjuvant radiation by Dr Lisbeth Renshaw  -She is currently on AI with Anastrozole since 04/2018.  -She is being followed by Dr Jana Hakim.    4. Genetic Testing  -Given 2 family histories and her personal history of breast and colon cancer she is eligible for genetic testing. She is interested, I will send referral.    5. 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 I recommend she stop given she will be on chemotherapy and her overall cancer treatment. She is willing to stop drinking.     PLAN:  -IV Venofer weekly X3, with repeated lab on last infusion  -Send genetics referral  -PET scan in 1-2 weeks. I will call her with results.  -Consult with Dr Dema Severin on 6/9 -F/u after colon surgery, or sooner if needed.    Orders Placed This Encounter  Procedures  . NM PET Image Initial (PI) Skull Base To Thigh    Lung nodules, rule out metastasis    Standing Status:   Future    Standing Expiration Date:   09/25/2020    Order Specific Question:   If indicated for the ordered procedure, I authorize the administration of a radiopharmaceutical per Radiology protocol    Answer:   Yes    Order Specific Question:   Preferred imaging location?    Answer:   [facility]    Order Specific Question:   Release to patient    Answer:   Immediate    Order Specific Question:   Radiology Contrast Protocol - do NOT remove file path    Answer:   _0 [email or web address]  . Ambulatory referral to Genetics    Referral Priority:   Routine    Referral Type:   Consultation     Referral Reason:   Specialty Services Required    Number of Visits Requested:   1  . ABO/Rh    All questions were answered. The patient knows to call the clinic with any problems, questions or concerns. The total time spent in the appointment was 50 minutes.     [name], MD 09/26/2019 11:10 PM  I, Joslyn Devon, am acting as scribe for [name], MD.   I have reviewed the above documentation for accuracy and completeness, and I agree with the above.

## 2019-09-26 NOTE — Progress Notes (Signed)
Spoke with patient regarding her recent hospital stay, and abnormal scans.  Offered her appointment with Dr. Burr Medico this afternoon at 2:00 and she agreed to take this appointment.  I explained as nurse navigator I would be meeting her at that time as well.  She verbalized an understanding and appreciated the quick response.

## 2019-09-27 ENCOUNTER — Telehealth: Payer: Self-pay | Admitting: Hematology

## 2019-09-27 NOTE — Telephone Encounter (Signed)
Scheduled appt per 5/27 los. ° °Spoke with pt and they are aware of the appt date and time. °

## 2019-09-28 ENCOUNTER — Encounter: Payer: Self-pay | Admitting: Hematology

## 2019-10-02 ENCOUNTER — Telehealth: Payer: Self-pay | Admitting: Oncology

## 2019-10-02 ENCOUNTER — Other Ambulatory Visit: Payer: Self-pay

## 2019-10-02 NOTE — Progress Notes (Signed)
Spoke with patient regarding Tumor Board discussion this morning.  Informed her PET scan was denies by her insurance company.  Her scans were reviewed and it is felt that the lung nodule is post radiation treatment changes.  She verbalized an understanding and is still planning to see Dr. Dema Severin (surgeon) on 6/9.

## 2019-10-02 NOTE — Progress Notes (Signed)
Spoke with patient regarding

## 2019-10-02 NOTE — Telephone Encounter (Signed)
Scheduled per 6/1 sch messgae. Unable to reach pt. Left voicemail- appts added for genetics on 6/16.

## 2019-10-03 ENCOUNTER — Encounter: Payer: Self-pay | Admitting: Oncology

## 2019-10-03 ENCOUNTER — Other Ambulatory Visit: Payer: Self-pay | Admitting: Hematology

## 2019-10-03 NOTE — Progress Notes (Signed)
Pt returned my call and stated she appreciates the offer for the grant but she is going to decline at this time.  She wants to leave it for someone in greater need.  I will give her my card on 10/04/19 for any questions or concerns she may have in the future.

## 2019-10-03 NOTE — Progress Notes (Signed)
Called pt to introduce myself as her Arboriculturist and to discuss the [employer].  Pt has 2 insurances so copay assistance shouldn't be needed.  I left a msg requesting she return my call if she would like to apply for the grant.

## 2019-10-04 ENCOUNTER — Inpatient Hospital Stay: Payer: BC Managed Care – PPO | Attending: Hematology

## 2019-10-04 ENCOUNTER — Other Ambulatory Visit: Payer: Self-pay

## 2019-10-04 VITALS — BP 140/79 | HR 76 | Temp 98.6°F | Resp 18

## 2019-10-04 DIAGNOSIS — Z79899 Other long term (current) drug therapy: Secondary | ICD-10-CM | POA: Diagnosis not present

## 2019-10-04 DIAGNOSIS — C187 Malignant neoplasm of sigmoid colon: Secondary | ICD-10-CM | POA: Diagnosis present

## 2019-10-04 DIAGNOSIS — C50511 Malignant neoplasm of lower-outer quadrant of right female breast: Secondary | ICD-10-CM | POA: Diagnosis present

## 2019-10-04 DIAGNOSIS — Z8249 Family history of ischemic heart disease and other diseases of the circulatory system: Secondary | ICD-10-CM | POA: Diagnosis not present

## 2019-10-04 DIAGNOSIS — Z170 Estrogen receptor positive status [ER+]: Secondary | ICD-10-CM | POA: Diagnosis not present

## 2019-10-04 DIAGNOSIS — Z803 Family history of malignant neoplasm of breast: Secondary | ICD-10-CM | POA: Insufficient documentation

## 2019-10-04 DIAGNOSIS — Z818 Family history of other mental and behavioral disorders: Secondary | ICD-10-CM | POA: Diagnosis not present

## 2019-10-04 DIAGNOSIS — D500 Iron deficiency anemia secondary to blood loss (chronic): Secondary | ICD-10-CM

## 2019-10-04 DIAGNOSIS — Z7289 Other problems related to lifestyle: Secondary | ICD-10-CM | POA: Insufficient documentation

## 2019-10-04 DIAGNOSIS — Z801 Family history of malignant neoplasm of trachea, bronchus and lung: Secondary | ICD-10-CM | POA: Diagnosis not present

## 2019-10-04 MED ORDER — SODIUM CHLORIDE 0.9 % IV SOLN
Freq: Once | INTRAVENOUS | Status: AC
  Filled 2019-10-04: qty 250

## 2019-10-04 MED ORDER — SODIUM CHLORIDE 0.9 % IV SOLN
200.0000 mg | Freq: Once | INTRAVENOUS | Status: AC
  Administered 2019-10-04: 200 mg via INTRAVENOUS
  Filled 2019-10-04: qty 200

## 2019-10-04 NOTE — Patient Instructions (Signed)

## 2019-10-08 ENCOUNTER — Encounter (HOSPITAL_COMMUNITY): Payer: Medicare Other

## 2019-10-08 ENCOUNTER — Telehealth: Payer: Self-pay | Admitting: Hematology

## 2019-10-08 NOTE — Telephone Encounter (Signed)
Pt called and wanted to have PET scan done. I called her back, no answers, I left a detailed message: PET scan request was denied by her insurance. Her CT scan were reviewed in our GI tumor board, we believe her right apex lung change is related to previous breast radiation (she received RT to [facility] area), unlikely related to her recent colon cancer. So the consensus is observation and we will repeat CT scan in 6 months. I encouraged pt to call me back if she has further questions.  Sonia Sandoval  10/08/2019

## 2019-10-11 ENCOUNTER — Inpatient Hospital Stay: Payer: BC Managed Care – PPO

## 2019-10-11 ENCOUNTER — Other Ambulatory Visit: Payer: Self-pay

## 2019-10-11 VITALS — BP 130/66 | HR 69 | Temp 98.1°F | Resp 19

## 2019-10-11 DIAGNOSIS — C187 Malignant neoplasm of sigmoid colon: Secondary | ICD-10-CM | POA: Diagnosis not present

## 2019-10-11 DIAGNOSIS — D500 Iron deficiency anemia secondary to blood loss (chronic): Secondary | ICD-10-CM

## 2019-10-11 MED ORDER — SODIUM CHLORIDE 0.9 % IV SOLN
200.0000 mg | Freq: Once | INTRAVENOUS | Status: AC
  Administered 2019-10-11: 200 mg via INTRAVENOUS
  Filled 2019-10-11: qty 200

## 2019-10-11 MED ORDER — SODIUM CHLORIDE 0.9 % IV SOLN
Freq: Once | INTRAVENOUS | Status: AC
  Filled 2019-10-11: qty 250

## 2019-10-11 NOTE — Patient Instructions (Signed)

## 2019-10-16 ENCOUNTER — Inpatient Hospital Stay (HOSPITAL_BASED_OUTPATIENT_CLINIC_OR_DEPARTMENT_OTHER): Payer: BC Managed Care – PPO | Admitting: Genetic Counselor

## 2019-10-16 ENCOUNTER — Inpatient Hospital Stay: Payer: BC Managed Care – PPO

## 2019-10-16 ENCOUNTER — Encounter: Payer: Self-pay | Admitting: Genetic Counselor

## 2019-10-16 ENCOUNTER — Other Ambulatory Visit: Payer: Self-pay

## 2019-10-16 ENCOUNTER — Ambulatory Visit: Payer: Self-pay | Admitting: Surgery

## 2019-10-16 DIAGNOSIS — C187 Malignant neoplasm of sigmoid colon: Secondary | ICD-10-CM

## 2019-10-16 DIAGNOSIS — Z803 Family history of malignant neoplasm of breast: Secondary | ICD-10-CM | POA: Insufficient documentation

## 2019-10-16 DIAGNOSIS — Z170 Estrogen receptor positive status [ER+]: Secondary | ICD-10-CM

## 2019-10-16 DIAGNOSIS — C50511 Malignant neoplasm of lower-outer quadrant of right female breast: Secondary | ICD-10-CM

## 2019-10-16 NOTE — Progress Notes (Signed)
REFERRING PROVIDER: [name], MD [place],  [place] [address]  PRIMARY PROVIDER:  [name], FNP  PRIMARY REASON FOR VISIT:  1. Cancer of sigmoid colon ([place])   2. Family history of breast cancer   3. Malignant neoplasm of lower-outer quadrant of right breast of female, estrogen receptor positive ([place])      HISTORY OF PRESENT ILLNESS:   Sonia Sandoval, a 69 y.o. female, was seen for a [place] cancer genetics consultation at the request of Dr. Burr Medico due to a personal and family history of cancer.  Sonia Sandoval presents to clinic today to discuss the possibility of a hereditary predisposition to cancer, genetic testing, and to further clarify her future cancer risks, as well as potential cancer risks for family members.   In 2019, at the age of 34, Sonia Sandoval was diagnosed with invasive ductal carcinoma. The treatment plan double mastectomy and radiation.  In 2021, at  The age of 62, Sonia Sandoval was diagnosed with colon cancer.      CANCER HISTORY:  Oncology History Overview Note  Cancer Staging Malignant neoplasm of lower-outer quadrant of right breast of female, estrogen receptor positive ([place]) Staging form: Breast, AJCC 8th Edition - Clinical: Stage IIB (cT2, cN1, cM0, G3, ER+, PR+, HER2-) - Unsigned - Pathologic: Stage IIA (pT2, pN1(sn), cM0, G3, ER+, PR+, HER2-) - Unsigned    Malignant neoplasm of lower-outer quadrant of right breast of female, estrogen receptor positive ([place])  10/23/2017 Initial Diagnosis    right breast lower outer quadrant biopsy 10/23/2017 for a clinical T2 N1, stage IIB invasive ductal carcinoma, grade 3, estrogen and progesterone receptor positive, HER-2 not amplified, with an MIB-1 of 20%   11/07/2017 Miscellaneous   Mammaprint on was read as low risk, predicting no significant chemotherapy benefit with a 5-year distant disease-free survival in the 97-98% range with hormone therapy alone   12/05/2017 Surgery   status post bilateral  mastectomies on 12/05/2017 showing             (a) on the left, no evidence of malignancy             (b) on the right, a pT2 pN1, stage IIB invasive ductal carcinoma, grade 3, with negative margins. By Dr Brantley Stage   01/15/2018 - 02/28/2018 Radiation Therapy   adjuvant radiation 01/15/2018 - 02/28/2018 with Dr Lisbeth Renshaw              Site/dose: The patient initially received a dose of 50.4 Gy in 28 fractions to the chest wall and supraclavicular region. This was delivered using a 3-D conformal, 4 field technique. The patient then received a boost to the mastectomy scar. This delivered an additional 10 Gy in 5 fractions using an en face electron field. The total dose was 60.4 Gy.    04/01/2018 -  Anti-estrogen oral therapy   started anastrozole              (a) bone density 03/07/2018 with a T score of -1.8   Cancer of sigmoid colon ([place])  09/23/2019 Tumor Marker   Baseline CEA 1.4 on 09/23/19    09/23/2019 Procedure   Colonoscopy and Upper endoscopy with Dr Alessandra Bevels 09/23/19  Upper Endoscopy Impression - Z-line regular, 35 cm from the incisors. - Small hiatal hernia. - Non-bleeding gastric ulcer. Biopsied. - Hematin (altered blood/coffee-ground-like material) in the gastric body. - Normal duodenal bulb, first portion of the duodenum and second portion of - Normal duodenal bulb, first portion of the  duodenum and second portion of the duodenum.  Colonoscopy Impression - Preparation of the colon was poor. - Perianal skin tags found on perianal exam. - Likely malignant tumor in the recto-sigmoid colon. Biopsied. Tattooed. - Internal hemorrhoids.   09/23/2019 Initial Biopsy   FINAL MICROSCOPIC DIAGNOSIS: 09/23/19 A. STOMACH, ULCER, BIOPSY:  - Gastric antral mucosa with mild nonspecific reactive gastropathy  - Warthin Starry stain is negative for Helicobacter pylori   B. COLON, RECTOSIGMOID, MASS, BIOPSY:  - Adenocarcinoma, see comment  COMMENT:   B.  Immunohistochemical stains for  MMR-related proteins are pending and  will be reported in an addendum. Dr. Saralyn Pilar reviewed the case and  concurs with the diagnosis. Dr. Alessandra Bevels was notified on 09/24/2019.     ADDENDUM:  Mismatch Repair Protein (IHC)  SUMMARY INTERPRETATION: NORMAL  There is preserved expression of the major MMR proteins. There is a very  low probability that microsatellite instability (MSI) is present.  However, certain clinically significant MMR protein mutations may result  in preservation of nuclear expression. It is recommended that the  preservation of protein expression be correlated with molecular based  MSI testing.   IHC EXPRESSION RESULTS  TEST           RESULT  MLH1:          Preserved nuclear expression  MSH2:          Preserved nuclear expression  MSH6:          Preserved nuclear expression  PMS2:          Preserved nuclear expression    09/23/2019 Imaging   CT CAP W Contrast 09/23/19  IMPRESSION: 1. There is eccentric wall thickening of the rectosigmoid colon, in keeping with mass identified and biopsied by colonoscopy.   2. There are prominent subcentimeter lymph nodes in the sigmoid mesocolon, suspicious for nodal metastatic disease. There is no other lymphadenopathy in the abdomen or pelvis.   3. There are multiple small bilateral pulmonary nodules measuring up to 4 mm, nonspecific although very suspicious for pulmonary metastatic disease. Given history of breast malignancy, previous imaging of the chest would be very helpful to assess for stability. This report may be addended for comparison if prior imaging can be obtained.   4. Status post bilateral mastectomy with irregular subpleural opacity of the anterior right upper lobe, consistent with history of breast malignancy and radiation fibrosis.   5. There is adjacent nodularity of the right pulmonary apex, the largest discrete nodular component measuring up to 1.1 cm. This is more favored infectious or inflammatory  although nonspecific and metastatic disease is not excluded. As as above, prior imaging would be very helpful to assess.   6. Small focus of nonspecific infectious or inflammatory airspace disease of the perihilar left upper lobe.   09/26/2019 Initial Diagnosis   Cancer of sigmoid colon ([place])      RISK FACTORS:  Menarche was at age 15.  First live birth at age 74.  OCP use for approximately 0 years.  Ovaries intact: yes.  Hysterectomy: no.  Menopausal status: postmenopausal.  Colonoscopy: yes; no polyps. Mammogram within the last year: yes. Up to date with pelvic exams: yes. Any excessive radiation exposure in the past: no  Past Medical History:  Diagnosis Date  . Allergies 1995   Per patient report 11/14/18  . Breast cancer ([place])   . Breast cancer, right breast ([place]) 09/2017   S/P mastectomy 12/05/2017  . Family history of breast cancer   . Headache  1968   per patient report, started as a teenager  . 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  . Osteopenia 03/2018   Seen on DEXA Scan  . PONV (postoperative nausea and vomiting)     Past Surgical History:  Procedure Laterality Date  . BACK SURGERY  2003   Patient will occassional back pain since 2003  . BIOPSY  09/23/2019   Procedure: BIOPSY;  Surgeon: Otis Brace, MD;  Location: [facility];  Service: Gastroenterology;;  EGD and COLON  . BREAST BIOPSY Right 09/2017  . [place]; 1992  . COLONOSCOPY WITH PROPOFOL N/A 09/23/2019   Procedure: COLONOSCOPY WITH PROPOFOL and EGD;  Surgeon: Otis Brace, MD;  Location: [facility];  Service: Gastroenterology;  Laterality: N/A;  . DILATION AND CURETTAGE OF UTERUS    . ESOPHAGOGASTRODUODENOSCOPY N/A 09/23/2019   Procedure: ESOPHAGOGASTRODUODENOSCOPY (EGD);  Surgeon: Otis Brace, MD;  Location: [facility];  Service: Gastroenterology;   Laterality: N/A;  . LUMBAR [place] SURGERY  2003  . MASTECTOMY Left 12/05/2017   PROPHYLACTIC MASTECTOMY  . MASTECTOMY COMPLETE / SIMPLE W/ SENTINEL NODE BIOPSY Right 12/05/2017   WITH RADIOACTIVE SEED TARGETED RIGHT AXIILARY LYMPH NODE EXCISION AND RIGHT SENTINEL LYMPH NODE BIOPSY,   . MASTECTOMY WITH RADIOACTIVE SEED GUIDED EXCISION AND AXILLARY SENTINEL LYMPH NODE BIOPSY Bilateral 12/05/2017   Procedure: RIGHT SIMPLE MASTECTOMY WITH RADIOACTIVE SEED TARGETED RIGHT AXIILARY LYMPH NODE EXCISION AND RIGHT SENTINEL LYMPH NODE BIOPSY, LEFT PROPHYLACTIC MASTECTOMY;  Surgeon: [name], MD;  Location: [facility];  Service: General;  Laterality: Bilateral;  . SUBMUCOSAL TATTOO INJECTION  09/23/2019   Procedure: SUBMUCOSAL TATTOO INJECTION;  Surgeon: Otis Brace, MD;  Location: [facility];  Service: Gastroenterology;;    Social History   Socioeconomic History  . Marital status: Married    Spouse name: Not on file  . Number of children: 4  . Years of education: Not on file  . Highest education level: Not on file  Occupational History  . Not on file  Tobacco Use  . Smoking status: Never Smoker  . Smokeless tobacco: Never Used  Vaping Use  . Vaping Use: Never used  Substance and Sexual Activity  . Alcohol use: Yes    Alcohol/week: 14.0 standard drinks    Types: 14 Glasses of wine per week  . Drug use: Never  . Sexual activity: Not on file  Other Topics Concern  . Not on file  Social History Narrative   12-19-17 Unable to ask abuse questions husband with her today.   Social Determinants of Health   Financial Resource Strain:   . Difficulty of Paying Living Expenses:   Food Insecurity:   . Worried About Charity fundraiser in the Last Year:   . Arboriculturist in the Last Year:   Transportation Needs:   . Film/video editor (Medical):   Marland Kitchen Lack of Transportation (Non-Medical):   Physical Activity:   . Days of Exercise per Week:   . Minutes of Exercise per Session:    Stress:   . Feeling of Stress :   Social Connections:   . Frequency of Communication with Friends and Family:   . Frequency of Social Gatherings with Friends and Family:   . Attends Religious Services:   . Active Member of Clubs or Organizations:   . Attends Archivist Meetings:   Marland Kitchen Marital Status:  FAMILY HISTORY:  We obtained a detailed, 4-generation family history.  Significant diagnoses are listed below: Family History  Problem Relation Age of Onset  . Dementia Mother   . Lung cancer Father   . Cancer Maternal Aunt 60       breast cancer   . Cancer Cousin        maternal first cousin with breast cancer in her 99s  . Heart disease Maternal Grandfather     The patient has three sons and a daughter who are cancer free.  She is the only child.  Both parents are deceased.  The patient's mother died form complications of dementia.  She had four sisters and five brothers.  One sister had breast cancer in her 94's.  One brother had a daughter with breast cancer.  The maternal grandparents are deceased.  The patient's father died form lung cancer.  He had two full and one maternal half sister.  No information is known about these individuals.  Sonia Sandoval is unaware of previous family history of genetic testing for hereditary cancer risks. Patient's maternal ancestors are of [place] descent, and paternal ancestors are of German/Irish descent. There is no reported Ashkenazi Jewish ancestry. There is no known consanguinity.  GENETIC COUNSELING ASSESSMENT: Sonia Sandoval is a 69 y.o. female with a personal and family history of cancer which is somewhat suggestive of a hereditary cancer syndrome and predisposition to cancer given the number of individuals with breast cancer and the personal history of two cancers. We, therefore, discussed and recommended the following at today's visit.   DISCUSSION: We discussed that 5 - 10% of breast cancer is hereditary, with most cases  associated with BRCA mutations.  There are other genes that can be associated with hereditary breast cancer syndromes.  These include ATM, CHEK2 and PALB2.  We discussed that testing is beneficial for several reasons including knowing how to follow individuals after completing their treatment, identifying whether potential treatment options such as PARP inhibitors would be beneficial, and understand if other family members could be at risk for cancer and allow them to undergo genetic testing.   We reviewed the characteristics, features and inheritance patterns of hereditary cancer syndromes. We also discussed genetic testing, including the appropriate family members to test, the process of testing, insurance coverage and turn-around-time for results. We discussed the implications of a negative, positive, carrier and/or variant of uncertain significant result. We recommended Sonia Sandoval pursue genetic testing for the common hereditary cancer gene panel. The Common Hereditary Gene Panel offered by [facility] includes sequencing and/or deletion duplication testing of the following 48 genes: APC, ATM, AXIN2, BARD1, BMPR1A, BRCA1, BRCA2, BRIP1, CDH1, CDK4, CDKN2A (p14ARF), CDKN2A (p16INK4a), CHEK2, CTNNA1, DICER1, EPCAM (Deletion/duplication testing only), GREM1 (promoter region deletion/duplication testing only), KIT, MEN1, MLH1, MSH2, MSH3, MSH6, MUTYH, NBN, NF1, NHTL1, PALB2, PDGFRA, PMS2, POLD1, POLE, PTEN, RAD50, RAD51C, RAD51D, RNF43, SDHB, SDHC, SDHD, SMAD4, SMARCA4. STK11, TP53, TSC1, TSC2, and VHL.  The following genes were evaluated for sequence changes only: SDHA and HOXB13 c.251G>A variant only.   Based on Sonia Sandoval's personal and family history of cancer, she meets medical criteria for genetic testing. Despite that she meets criteria, she may still have an out of pocket cost. We discussed that if her out of pocket cost for testing is over $100, the laboratory will call and confirm whether she wants to  proceed with testing.  If the out of pocket cost of testing is less than $100 she will be billed by  the genetic testing laboratory.   PLAN: After considering the risks, benefits, and limitations, Sonia Sandoval provided informed consent to pursue genetic testing and the blood sample was sent to [facility] for analysis of the common hereditary cancer panel. Results should be available within approximately 2-3 weeks' time, at which point they will be disclosed by telephone to Sonia Sandoval, as will any additional recommendations warranted by these results. Sonia Sandoval will receive a summary of her genetic counseling visit and a copy of her results once available. This information will also be available in Epic.   Lastly, we encouraged Sonia Sandoval to remain in contact with cancer genetics annually so that we can continuously update the family history and inform her of any changes in cancer genetics and testing that may be of benefit for this family.   Sonia Sandoval questions were answered to her satisfaction today. Our contact information was provided should additional questions or concerns arise. Thank you for the referral and allowing [place] to share in the care of your patient.   Ferry Matthis P. Florene Glen, [place], [facility] Licensed, Insurance risk surveyor [email or web address] phone: [phone]  The patient was seen for a total of 45 minutes in face-to-face genetic counseling.  This patient was discussed with Drs. Magrinat, Lindi Adie and/or Burr Medico who agrees with the above.    _______________________________________________________________________ For Office Staff:  Number of people involved in session: 1 Was an Intern/ student involved with case: yes; [email or web address]

## 2019-10-16 NOTE — H&P (Signed)
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 She underwent workup and was found to have a mass in the rectosigmoid colon that was biopsied and tattooed. This returned invasive adenocarcinoma. This was 18 cm from the anal verge. She underwent staging CT chest/abdomen/pelvis that demonstrated small nonspecific pulmonary nodules up to 4 mm in size. Suspicious lymphadenopathy within the sigmoid mesocolon. She was referred to Dr. Burr Medico with medical oncology who had seen her and presented her case at our multidisciplinary tumor board. The consensus recommendation after reviewing all of her case details was that the tumor resided in the distal sigmoid as opposed to rectum proper. On her colonoscopy, this was proximal to the rectal folds and appears in distal sigmoid. It also appears that this tattoos distal to the lesion on the pictures and her colonoscopy. She was started on iron infusion and improved. She is discharged. She presents my office today for evaluation. She has been doing reasonably well albeit with some fatigue. She denies nausea/vomiting or any bright red blood in her stool this time. She denies any abdominal pain.  PMH: Breast cancer, back pain  PSH: Bilateral mastectomy; back surgery via posterior approach. C-section 2.  FHx: She reports on the maternal side of her family, and aunt and cousin both with breast cancer. She denies any known family history of breast, colorectal, or ovarian cancer  Social: Denies use of tobacco/drugs; reports 1-2 glasses of wine on Saturday and Sunday. She is happily retired.  ROS: A comprehensive 10 system review of systems was completed with the patient and pertinent findings as noted above.  The patient is a 69 year old female.   Problem List/Past Medical Lattie Haw  [place], [place]; 10/09/2019 8:59 AM) BREAST CANCER, RIGHT (C50.911)  CHANGE OR REMOVAL OF DRAINS (Z48.03)  POST-OPERATIVE STATE [phone])   Past Surgical History Darden [place], [place]; 10/09/2019 8:59 AM) Breast Biopsy  Right. Cesarean Section - Multiple  Colon Polyp Removal - Colonoscopy  Oral Surgery  Spinal Surgery - Lower Back   Diagnostic Studies History Darden Palmer, [place]; 10/09/2019 8:59 AM) Colonoscopy  5-10 years ago Mammogram  within last year  Allergies Darden Palmer, RMA; 10/09/2019 8:59 AM) Codeine Sulfate *ANALGESICS - OPIOID*  Nausea, Vomiting.  Social History Darden Palmer, [place]; 10/09/2019 8:59 AM) Alcohol use  Moderate alcohol use. Tobacco use  Never smoker.  Family History Darden [place], [place]; 10/09/2019 8:59 AM) Heart Disease  Family Members In General, Mother. Melanoma  Family Members In General. Ovarian Cancer  Family Members In General. Respiratory Condition  Father. Seizure disorder  Son. Thyroid problems  Father.  Pregnancy / Birth History Darden Palmer, [place]; 10/09/2019 8:59 AM) Age at menarche  8 years. Age of menopause  <46 Contraceptive History  Oral contraceptives. Irregular periods  Para  4  Other Problems Darden Palmer, [place]; 10/09/2019 8:59 AM) Back Pain  Breast Cancer  Cholelithiasis  Diverticulosis  Gastric Ulcer  General anesthesia - complications  Lump In Breast     Review of Systems Harrell Gave M. Daneka Lantigua MD; 10/09/2019 9:40 AM) General Not Present- Appetite Loss, Chills, Fatigue, Fever, Night Sweats, Weight Gain and Weight Loss. Skin Not Present- Change in Wart/Mole, Dryness, Hives, Jaundice, New Lesions, Non-Healing Wounds, Rash and Ulcer. HEENT Present- Seasonal Allergies, Sinus Pain, Visual Disturbances and Wears glasses/contact lenses. Not Present- Earache, Hearing Loss, Hoarseness, Nose Bleed, Oral  Ulcers, Ringing in the Ears, Sore Throat and Yellow Eyes. Respiratory Present- Chronic Cough and Snoring. Not  Present- Bloody sputum, Difficulty Breathing and Wheezing. Breast Not Present- Breast Mass, Breast Pain, Nipple Discharge and Skin Changes. Cardiovascular Not Present- Chest Pain, Difficulty Breathing Lying Down, Leg Cramps, Palpitations, Rapid Heart Rate, Shortness of Breath and Swelling of Extremities. Gastrointestinal Present- Hemorrhoids and Rectal Bleeding. Not Present- Abdominal Pain, Bloating, Bloody Stool, Change in Bowel Habits, Chronic diarrhea, Constipation, Difficulty Swallowing, Excessive gas, Gets full quickly at meals, Indigestion, Nausea, Rectal Pain and Vomiting. Female Genitourinary Not Present- Frequency, Nocturia, Painful Urination, Pelvic Pain and Urgency. Musculoskeletal Present- Joint Stiffness. Not Present- Back Pain, Joint Pain, Muscle Pain, Muscle Weakness and Swelling of Extremities. Neurological Not Present- Decreased Memory, Fainting, Headaches, Numbness, Seizures, Tingling, Tremor, Trouble walking and Weakness. Psychiatric Not Present- Anxiety, Bipolar, Change in Sleep Pattern, Depression, Fearful and Frequent crying. Hematology Present- Easy Bruising. Not Present- Blood Thinners, Excessive bleeding, Gland problems, HIV and Persistent Infections.  Vitals Lattie Haw Caldwell RMA; 10/09/2019 9:00 AM) 10/09/2019 8:59 AM Weight: 147.38 lb Height: 63in Body Surface Area: 1.7 m Body Mass Index: 26.11 kg/m  Temp.: 79.13F  Pulse: 85 (Regular)  BP: 122/80(Sitting, Left Arm, Standard)       Physical Exam Harrell Gave M. Juston Goheen MD; 10/09/2019 9:40 AM) The physical exam findings are as follows: Note: Constitutional: No acute distress; conversant; no deformities; wearing mask Eyes: Moist conjunctiva; no lid lag; anicteric sclerae; pupils equal and round Neck: Trachea midline; no palpable thyromegaly Lungs: Normal respiratory effort; no tactile fremitus CV: rrr; no palpable thrill; no pitting edema GI: Abdomen soft, nontender, nondistended; no palpable  hepatosplenomegaly MSK: Normal gait; no clubbing/cyanosis Psychiatric: Appropriate affect; alert and oriented 3 Lymphatic: No palpable cervical or axillary lymphadenopathy    Assessment & Plan Harrell Gave M. Donn Zanetti MD; 10/09/2019 9:43 AM) COLON CANCER (C18.9) Story: Ms. Uriegas is a very pleasant 48yoF with hx of breast ca and back pain - here for evaluation of newly diagnosed rectosigmoid colon cancer - favored distal sigmoid on her imaging and colonoscopy  CT CAP shows no definitive evidence of metastatic disease but findings that would require surveillance including small pulmonary nodules. These have been reviewed at [facility]. There are some mesenteric lymphadenopathy associated with this sigmoid mesocolon. Impression: -The anatomy and physiology of the GI tract was discussed at length with the patient. The pathophysiology of colon cancer was discussed at length with associated pictures. -We reviewed robotic assisted sigmoidectomy/low anterior resection, flexible sigmoidoscopy; scenarios where open surgery may be necessary. -The planned procedures, material risks (including, but not limited to, pain, bleeding, infection, scarring, need for blood transfusion, damage to surrounding structures- blood vessels/nerves/viscus/organs, damage to ureter, urine leak, leak from anastomosis, need for additional procedures, worsening of pre-existing medical conditions, need for stoma which may be permanent, hernia, recurrence of cancer, DVT/PE, pneumonia, heart attack, stroke, death) benefits and alternatives to surgery were discussed at length. We also reviewed scenarios where adjuvant chemotherapy may be indicated/offered. The patient's and her husband's questions were answered to their satisfaction, they voiced understanding and elected to proceed with surgery. Additionally, we discussed typical postoperative expectations and the recovery process.  This patient encounter took 48 minutes today to perform the  following: take history, perform exam, review outside records, interpret imaging, counsel the patient on their diagnosis and document encounter, findings & plan in the EHR  Signed by [name], MD (10/09/2019 9:44 AM)

## 2019-10-17 ENCOUNTER — Telehealth: Payer: Self-pay | Admitting: Hematology

## 2019-10-17 NOTE — Telephone Encounter (Signed)
I called patient, and informed her that her PET scan was approved.  She was very happy with like to have a PET scan done as soon as possible.  We discussed that her nodules and inflammation in the Upper right lung could be positive on PET scan, and biopsy may be warranted if it is positive on PET.  She voiced good understanding, and agrees with the plan.  She is coming next week for his third dose of IV Venofer, still feels quite tired, still has mild rectal bleeding.  I will set up lab, follow-up and iv venofer in 2 weeks, a few days after PET.  [name]  10/17/2019

## 2019-10-18 ENCOUNTER — Other Ambulatory Visit: Payer: Self-pay

## 2019-10-18 ENCOUNTER — Other Ambulatory Visit: Payer: BC Managed Care – PPO

## 2019-10-18 ENCOUNTER — Ambulatory Visit: Payer: BC Managed Care – PPO

## 2019-10-18 DIAGNOSIS — C50511 Malignant neoplasm of lower-outer quadrant of right female breast: Secondary | ICD-10-CM

## 2019-10-18 DIAGNOSIS — C187 Malignant neoplasm of sigmoid colon: Secondary | ICD-10-CM

## 2019-10-21 ENCOUNTER — Other Ambulatory Visit: Payer: Self-pay

## 2019-10-21 ENCOUNTER — Inpatient Hospital Stay: Payer: BC Managed Care – PPO

## 2019-10-21 VITALS — BP 116/67 | HR 64 | Temp 98.7°F | Resp 19

## 2019-10-21 DIAGNOSIS — K922 Gastrointestinal hemorrhage, unspecified: Secondary | ICD-10-CM

## 2019-10-21 DIAGNOSIS — C187 Malignant neoplasm of sigmoid colon: Secondary | ICD-10-CM | POA: Diagnosis not present

## 2019-10-21 DIAGNOSIS — D500 Iron deficiency anemia secondary to blood loss (chronic): Secondary | ICD-10-CM

## 2019-10-21 DIAGNOSIS — D649 Anemia, unspecified: Secondary | ICD-10-CM

## 2019-10-21 LAB — CMP (CANCER CENTER ONLY)
ALT: 28 U/L (ref 0–44)
AST: 25 U/L (ref 15–41)
Albumin: 4.1 g/dL (ref 3.5–5.0)
Alkaline Phosphatase: 64 U/L (ref 38–126)
Anion gap: 14 (ref 5–15)
BUN: 14 mg/dL (ref 8–23)
CO2: 22 mmol/L (ref 22–32)
Calcium: 9.7 mg/dL (ref 8.9–10.3)
Chloride: 107 mmol/L (ref 98–111)
Creatinine: 0.77 mg/dL (ref 0.44–1.00)
GFR, Est AFR Am: >60 mL/min (ref >60)
GFR, Estimated: >60 mL/min (ref >60)
Glucose, Bld: 101 mg/dL — ABNORMAL HIGH (ref 70–99)
Potassium: 4.2 mmol/L (ref 3.5–5.1)
Sodium: 143 mmol/L (ref 135–145)
Total Bilirubin: 0.4 mg/dL (ref 0.3–1.2)
Total Protein: 6.7 g/dL (ref 6.5–8.1)

## 2019-10-21 LAB — CBC WITH DIFFERENTIAL (CANCER CENTER ONLY)
Abs Immature Granulocytes: 0 10*3/uL (ref 0.00–0.07)
Basophils Absolute: 0 10*3/uL (ref 0.0–0.1)
Basophils Relative: 1 %
Eosinophils Absolute: 0.1 10*3/uL (ref 0.0–0.5)
Eosinophils Relative: 2 %
HCT: 36.8 % (ref 36.0–46.0)
Hemoglobin: 11.8 g/dL — ABNORMAL LOW (ref 12.0–15.0)
Immature Granulocytes: 0 %
Lymphocytes Relative: 24 %
Lymphs Abs: 0.8 10*3/uL (ref 0.7–4.0)
MCH: 30.1 pg (ref 26.0–34.0)
MCHC: 32.1 g/dL (ref 30.0–36.0)
MCV: 93.9 fL (ref 80.0–100.0)
Monocytes Absolute: 0.2 10*3/uL (ref 0.1–1.0)
Monocytes Relative: 7 %
Neutro Abs: 2 10*3/uL (ref 1.7–7.7)
Neutrophils Relative %: 66 %
Platelet Count: 217 10*3/uL (ref 150–400)
RBC: 3.92 MIL/uL (ref 3.87–5.11)
RDW: 13.8 % (ref 11.5–15.5)
WBC Count: 3.1 10*3/uL — ABNORMAL LOW (ref 4.0–10.5)
nRBC: 0 % (ref 0.0–0.2)

## 2019-10-21 LAB — SAMPLE TO BLOOD BANK

## 2019-10-21 MED ORDER — SODIUM CHLORIDE 0.9 % IV SOLN
Freq: Once | INTRAVENOUS | Status: AC
  Filled 2019-10-21: qty 250

## 2019-10-21 MED ORDER — SODIUM CHLORIDE 0.9 % IV SOLN
200.0000 mg | Freq: Once | INTRAVENOUS | Status: AC
  Administered 2019-10-21: 200 mg via INTRAVENOUS
  Filled 2019-10-21: qty 200

## 2019-10-21 NOTE — Progress Notes (Signed)
Patient declined 30 minutes post Venofer infusion observation, verbal education provided.

## 2019-10-21 NOTE — Patient Instructions (Signed)

## 2019-10-28 ENCOUNTER — Encounter (HOSPITAL_COMMUNITY)
Admission: RE | Admit: 2019-10-28 | Discharge: 2019-10-28 | Disposition: A | Discharge status: Home / Self Care | Payer: BC Managed Care – PPO | Source: Ambulatory Visit | Attending: Hematology | Admitting: Hematology

## 2019-10-28 ENCOUNTER — Other Ambulatory Visit: Payer: Self-pay

## 2019-10-28 DIAGNOSIS — C187 Malignant neoplasm of sigmoid colon: Secondary | ICD-10-CM | POA: Insufficient documentation

## 2019-10-28 DIAGNOSIS — C50511 Malignant neoplasm of lower-outer quadrant of right female breast: Secondary | ICD-10-CM | POA: Insufficient documentation

## 2019-10-28 DIAGNOSIS — Z170 Estrogen receptor positive status [ER+]: Secondary | ICD-10-CM

## 2019-10-28 LAB — GLUCOSE, CAPILLARY: Glucose-Capillary: 107 mg/dL — ABNORMAL HIGH (ref 70–99)

## 2019-10-28 MED ORDER — FLUDEOXYGLUCOSE F - 18 (FDG) INJECTION
7.2900 | Freq: Once | INTRAVENOUS | Status: AC | PRN
  Administered 2019-10-28: 7.09 via INTRAVENOUS

## 2019-10-29 ENCOUNTER — Telehealth: Payer: Self-pay | Admitting: Hematology

## 2019-10-29 NOTE — Telephone Encounter (Signed)
Scheduled appt per 6/29 sch message - unable to reach pt .left message with appt date and time

## 2019-10-30 ENCOUNTER — Telehealth: Payer: Self-pay | Admitting: Genetic Counselor

## 2019-10-30 ENCOUNTER — Encounter: Payer: Self-pay | Admitting: Genetic Counselor

## 2019-10-30 DIAGNOSIS — Z1379 Encounter for other screening for genetic and chromosomal anomalies: Secondary | ICD-10-CM | POA: Insufficient documentation

## 2019-10-30 NOTE — Telephone Encounter (Signed)
left 2nd message that results were back.  Explained that we had good news and will call with details next week.

## 2019-10-30 NOTE — Telephone Encounter (Signed)
LM on VM that results are back and to please call. 

## 2019-10-31 ENCOUNTER — Encounter: Payer: Self-pay | Admitting: Hematology

## 2019-10-31 ENCOUNTER — Telehealth: Payer: Self-pay | Admitting: Hematology

## 2019-10-31 NOTE — Telephone Encounter (Signed)
I called patient, and discussed his PET scan findings.  The nodules in the right upper lung has mild FDG uptake, likely related to her previous breast irradiation.  I recommend follow-up, no need for biopsy at this point.  Besides the hypermetabolic primary colon cancer, no suspicious lymph nodes or liver or other metastasis on the scan.  Patient was pleased with the scan results.  She is scheduled for colon surgery with Dr. Dema Severin on November 22, 2019, and I will see her back in August.  Patient appreciated the call.  [name]  10/31/2019

## 2019-11-05 ENCOUNTER — Telehealth: Payer: Self-pay | Admitting: Genetic Counselor

## 2019-11-05 ENCOUNTER — Ambulatory Visit: Payer: Self-pay | Admitting: Genetic Counselor

## 2019-11-05 DIAGNOSIS — Z1379 Encounter for other screening for genetic and chromosomal anomalies: Secondary | ICD-10-CM

## 2019-11-05 NOTE — Progress Notes (Signed)
HPI:  Ms. Hinote was previously seen in the [place] clinic due to a personal and family history of cancer and concerns regarding a hereditary predisposition to cancer. Please refer to our prior cancer genetics clinic note for more information regarding our discussion, assessment and recommendations, at the time. Ms. Fouty recent genetic test results were disclosed to her, as were recommendations warranted by these results. These results and recommendations are discussed in more detail below.  CANCER HISTORY:  Oncology History Overview Note  Cancer Staging Malignant neoplasm of lower-outer quadrant of right breast of female, estrogen receptor positive ([place]) Staging form: Breast, AJCC 8th Edition - Clinical: Stage IIB (cT2, cN1, cM0, G3, ER+, PR+, HER2-) - Unsigned - Pathologic: Stage IIA (pT2, pN1(sn), cM0, G3, ER+, PR+, HER2-) - Unsigned    Malignant neoplasm of lower-outer quadrant of right breast of female, estrogen receptor positive ([place])  10/23/2017 Initial Diagnosis    right breast lower outer quadrant biopsy 10/23/2017 for a clinical T2 N1, stage IIB invasive ductal carcinoma, grade 3, estrogen and progesterone receptor positive, HER-2 not amplified, with an MIB-1 of 20%   11/07/2017 Miscellaneous   Mammaprint on was read as low risk, predicting no significant chemotherapy benefit with a 5-year distant disease-free survival in the 97-98% range with hormone therapy alone   12/05/2017 Surgery   status post bilateral mastectomies on 12/05/2017 showing             (a) on the left, no evidence of malignancy             (b) on the right, a pT2 pN1, stage IIB invasive ductal carcinoma, grade 3, with negative margins. By Dr Brantley Stage   01/15/2018 - 02/28/2018 Radiation Therapy   adjuvant radiation 01/15/2018 - 02/28/2018 with Dr Lisbeth Renshaw              Site/dose: The patient initially received a dose of 50.4 Gy in 28 fractions to the chest wall and supraclavicular region. This was  delivered using a 3-D conformal, 4 field technique. The patient then received a boost to the mastectomy scar. This delivered an additional 10 Gy in 5 fractions using an en face electron field. The total dose was 60.4 Gy.    04/01/2018 -  Anti-estrogen oral therapy   started anastrozole              (a) bone density 03/07/2018 with a T score of -1.8   10/28/2019 Genetic Testing   Negative genetic testing.  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  The Common Hereditary Gene Panel offered by [facility] includes sequencing and/or deletion duplication testing of the following 48 genes: APC, ATM, AXIN2, BARD1, BMPR1A, BRCA1, BRCA2, BRIP1, CDH1, CDK4, CDKN2A (p14ARF), CDKN2A (p16INK4a), CHEK2, CTNNA1, DICER1, EPCAM (Deletion/duplication testing only), GREM1 (promoter region deletion/duplication testing only), KIT, MEN1, MLH1, MSH2, MSH3, MSH6, MUTYH, NBN, NF1, NHTL1, PALB2, PDGFRA, PMS2, POLD1, POLE, PTEN, RAD50, RAD51C, RAD51D, RNF43, SDHB, SDHC, SDHD, SMAD4, SMARCA4. STK11, TP53, TSC1, TSC2, and VHL.  The following genes were evaluated for sequence changes only: SDHA and HOXB13 c.251G>A variant only. The report date is October 28, 2019.   Cancer of sigmoid colon ([place])  09/23/2019 Tumor Marker   Baseline CEA 1.4 on 09/23/19    09/23/2019 Procedure   Colonoscopy and Upper endoscopy with Dr Alessandra Bevels 09/23/19  Upper Endoscopy Impression - Z-line regular, 35 cm from the incisors. - Small hiatal hernia. - Non-bleeding gastric ulcer. Biopsied. - Hematin (altered blood/coffee-ground-like material) in the  gastric body. - Normal duodenal bulb, first portion of the duodenum and second portion of - Normal duodenal bulb, first portion of the duodenum and second portion of the duodenum.  Colonoscopy Impression - Preparation of the colon was poor. - Perianal skin tags found on perianal exam. - Likely malignant tumor in the recto-sigmoid colon. Biopsied. Tattooed. - Internal  hemorrhoids.   09/23/2019 Initial Biopsy   FINAL MICROSCOPIC DIAGNOSIS: 09/23/19 A. STOMACH, ULCER, BIOPSY:  - Gastric antral mucosa with mild nonspecific reactive gastropathy  - Warthin Starry stain is negative for Helicobacter pylori   B. COLON, RECTOSIGMOID, MASS, BIOPSY:  - Adenocarcinoma, see comment  COMMENT:   B.  Immunohistochemical stains for MMR-related proteins are pending and  will be reported in an addendum. Dr. Saralyn Pilar reviewed the case and  concurs with the diagnosis. Dr. Alessandra Bevels was notified on 09/24/2019.     ADDENDUM:  Mismatch Repair Protein ([facility]HC)  SUMMARY INTERPRETATION: NORMAL  There is preserved expression of the major MMR proteins. There is a very  low probability that microsatellite instability (MSI) is present.  However, certain clinically significant MMR protein mutations may result  in preservation of nuclear expression. It is recommended that the  preservation of protein expression be correlated with molecular based  MSI testing.   IHC EXPRESSION RESULTS  TEST           RESULT  MLH1:          Preserved nuclear expression  MSH2:          Preserved nuclear expression  MSH6:          Preserved nuclear expression  PMS2:          Preserved nuclear expression    09/23/2019 Imaging   CT CAP W Contrast 09/23/19  IMPRESSION: 1. There is eccentric wall thickening of the rectosigmoid colon, in keeping with mass identified and biopsied by colonoscopy.   2. There are prominent subcentimeter lymph nodes in the sigmoid mesocolon, suspicious for nodal metastatic disease. There is no other lymphadenopathy in the abdomen or pelvis.   3. There are multiple small bilateral pulmonary nodules measuring up to 4 mm, nonspecific although very suspicious for pulmonary metastatic disease. Given history of breast malignancy, previous imaging of the chest would be very helpful to assess for stability. This report may be addended for comparison if prior imaging can  be obtained.   4. Status post bilateral mastectomy with irregular subpleural opacity of the anterior right upper lobe, consistent with history of breast malignancy and radiation fibrosis.   5. There is adjacent nodularity of the right pulmonary apex, the largest discrete nodular component measuring up to 1.1 cm. This is more favored infectious or inflammatory although nonspecific and metastatic disease is not excluded. As as above, prior imaging would be very helpful to assess.   6. Small focus of nonspecific infectious or inflammatory airspace disease of the perihilar left upper lobe.   09/26/2019 Initial Diagnosis   Cancer of sigmoid colon ([place])     FAMILY HISTORY:  We obtained a detailed, 4-generation family history.  Significant diagnoses are listed below: Family History  Problem Relation Age of Onset  . Dementia Mother   . Lung cancer Father   . Cancer Maternal Aunt 60       breast cancer   . Cancer Cousin        maternal first cousin with breast cancer in her 78s  . Heart disease Maternal Grandfather     The patient has three  sons and a daughter who are cancer free.  She is the only child.  Both parents are deceased.  The patient's mother died form complications of dementia.  She had four sisters and five brothers.  One sister had breast cancer in her 16's.  One brother had a daughter with breast cancer.  The maternal grandparents are deceased.  The patient's father died form lung cancer.  He had two full and one maternal half sister.  No information is known about these individuals.  Ms. Carbonell is unaware of previous family history of genetic testing for hereditary cancer risks. Patient's maternal ancestors are of [place] descent, and paternal ancestors are of German/Irish descent. There is no reported Ashkenazi Jewish ancestry. There is no known consanguinity.    GENETIC TEST RESULTS: Genetic testing reported out on October 28, 2019 through the common hereditary  cancer panel found no pathogenic mutations. The Common Hereditary Gene Panel offered by [facility] includes sequencing and/or deletion duplication testing of the following 48 genes: APC, ATM, AXIN2, BARD1, BMPR1A, BRCA1, BRCA2, BRIP1, CDH1, CDK4, CDKN2A (p14ARF), CDKN2A (p16INK4a), CHEK2, CTNNA1, DICER1, EPCAM (Deletion/duplication testing only), GREM1 (promoter region deletion/duplication testing only), KIT, MEN1, MLH1, MSH2, MSH3, MSH6, MUTYH, NBN, NF1, NHTL1, PALB2, PDGFRA, PMS2, POLD1, POLE, PTEN, RAD50, RAD51C, RAD51D, RNF43, SDHB, SDHC, SDHD, SMAD4, SMARCA4. STK11, TP53, TSC1, TSC2, and VHL.  The following genes were evaluated for sequence changes only: SDHA and HOXB13 c.251G>A variant only. The test report has been scanned into EPIC and is located under the Molecular Pathology section of the Results Review tab.  A portion of the result report is included below for reference.     We discussed with Ms. Steedman that because current genetic testing is not perfect, it is possible there may be a gene mutation in one of these genes that current testing cannot detect, but that chance is small.  We also discussed, that there could be another gene that has not yet been discovered, or that we have not yet tested, that is responsible for the cancer diagnoses in the family. It is also possible there is a hereditary cause for the cancer in the family that Ms. Crittendon did not inherit and therefore was not identified in her testing.  Therefore, it is important to remain in touch with cancer genetics in the future so that we can continue to offer Ms. Redinger the most up to date genetic testing.   Genetic testing did identify a variant of uncertain significance (VUS) was identified in the [facility] gene called c.2035_2037del.  At this time, it is unknown if this variant is associated with increased cancer risk or if this is a normal finding, but most variants such as this get reclassified to being inconsequential. It should not be  used to make medical management decisions. With time, we suspect the lab will determine the significance of this variant, if any. If we do learn more about it, we will try to contact Ms. Hoelzel to discuss it further. However, it is important to stay in touch with [place] periodically and keep the address and phone number up to date.  ADDITIONAL GENETIC TESTING: We discussed with Ms. Woolston that there are other genes that are associated with increased cancer risk that can be analyzed. Should Ms. Kadow wish to pursue additional genetic testing, we are happy to discuss and coordinate this testing, at any time.    CANCER SCREENING RECOMMENDATIONS: Ms. Bazin test result is considered negative (normal).  This means that we have not identified  a hereditary cause for her personal and family history of cancer at this time. Most cancers happen by chance and this negative test suggests that her cancer may fall into this category.    While reassuring, this does not definitively rule out a hereditary predisposition to cancer. It is still possible that there could be genetic mutations that are undetectable by current technology. There could be genetic mutations in genes that have not been tested or identified to increase cancer risk.  Therefore, it is recommended she continue to follow the cancer management and screening guidelines provided by her oncology and primary healthcare provider.   An individual's cancer risk and medical management are not determined by genetic test results alone. Overall cancer risk assessment incorporates additional factors, including personal medical history, family history, and any available genetic information that may result in a personalized plan for cancer prevention and surveillance  RECOMMENDATIONS FOR FAMILY MEMBERS:  Individuals in this family might be at some increased risk of developing cancer, over the general population risk, simply due to the family history of cancer.  We  recommended women in this family have a yearly mammogram beginning at age 37, or 64 years younger than the earliest onset of cancer, an annual clinical breast exam, and perform monthly breast self-exams. Women in this family should also have a gynecological exam as recommended by their primary provider. All family members should be referred for colonoscopy starting at age 2.  FOLLOW-UP: Lastly, we discussed with Ms. Soohoo that cancer genetics is a rapidly advancing field and it is possible that new genetic tests will be appropriate for her and/or her family members in the future. We encouraged her to remain in contact with cancer genetics on an annual basis so we can update her personal and family histories and let her know of advances in cancer genetics that may benefit this family.   Our contact number was provided. Ms. Wojtaszek questions were answered to her satisfaction, and she knows she is welcome to call us at anytime with additional questions or concerns.   Roma Kayser, [place], [facility] Licensed, Certified Genetic Counselor [email or web address]

## 2019-11-05 NOTE — Telephone Encounter (Signed)
Revealed negative genetic testing.  Discussed that we do not know why she has breast cancer or why there is cancer in the family. It could be due to a different gene that we are not testing, or maybe our current technology may not be able to pick something up.  It will be important for her to keep in contact with genetics to keep up with whether additional testing may be needed.  There is one VUS in MSH3.  This will not change medical management.

## 2019-11-08 NOTE — Patient Instructions (Addendum)
DUE TO COVID-19 ONLY ONE VISITOR ARE ALLOWED TO COME WITH YOU AND STAY IN THE WAITING ROOM ONLY DURING PRE OP AND  PROCEDURE. THEN TWO VISITORS MAY VISIT WITH YOU IN YOUR PRIVATE ROOM DURING VISITING HOURS ONLY!!   COVID SWAB TESTING MUST BE COMPLETED ON:  11-19-19 @ 1:00 PM  [address] [place] -[facility] enter pre surgical testing line (Must self quarantine after testing. Follow instructions on handout.)            Your procedure is scheduled on: 11-22-19   Report to [facility] Main  Entrance   Report to admitting at 12:00 PM   Call this number if you have problems the morning of surgery [phone]   Please consume a Clear Liquid Diet the Day with your 11-21-19 prep, per your surgeon's instructions   DRINK 2 PRESURGERY ENSURE DRINKS THE NIGHT BEFORE SURGERY AT 1000 PM AND 1 PRESURGERY DRINK THE DAY OF THE PROCEDURE 3 HOURS PRIOR TO SCHEDULED SURGERY.  NOTHING BY MOUTH EXCEPT CLEAR LIQUIDS UNTIL THREE HOURS PRIOR TO SCHEDULED SURGERY. PLEASE FINISH PRESURGERY ENSURE DRINK PER SURGEON ORDER 3 HOURS PRIOR TO SCHEDULED SURGERY TIME WHICH NEEDS TO BE COMPLETED AT 11:00 AM.     CLEAR LIQUID DIET   Foods Allowed                                                                     Foods Excluded  Coffee and tea, regular and decaf                             liquids that you cannot  Plain Jell-O any favor except red or purple                                           see through such as: Fruit ices (not with fruit pulp)                                     milk, soups, orange juice  Iced Popsicles                                    All solid food Carbonated beverages, regular and diet                                    Cranberry, grape and apple juices Sports drinks like Gatorade Lightly seasoned clear broth or consume(fat free) Sugar, honey syrup  Sample Menu Breakfast                                Lunch  Supper Cranberry juice                    Beef broth                            Chicken broth Jell-O                                     Grape juice                           Apple juice Coffee or tea                        Jell-O                                      Popsicle                                                Coffee or tea                        Coffee or tea  _____________________________________________________________________       Take these medicines the morning of surgery with A SIP OF WATER: Anastrozole (Arimidex), Pantoprazole (Protonix)  Oral Hygiene is also important to reduce your risk of infection.                                     Remember - BRUSH YOUR TEETH THE MORNING OF SURGERY WITH YOUR REGULAR TOOTHPASTE                                You may not have any metal on your body including hair pins, jewelry, and body piercings               Do not wear make-up, lotions, powders, perfumes, or deodorant               Do not wear nail polish.  Do not shave  48 hours prior to surgery.                Do not bring valuables to the hospital. [place].   Contacts, dentures or bridgework may not be worn into surgery.   Bring small overnight bag day of surgery.    Special Instructions: Bring a copy of your healthcare power of attorney and living will documents the day of surgery if you haven't scanned them in  before.              Please read over the following fact sheets you were given: IF YOU HAVE QUESTIONS ABOUT YOUR PRE OP INSTRUCTIONS PLEASE CALL  [phone]    Before surgery, you can play an important role.  Because skin is not sterile, your skin needs to be as free of germs as possible.  You can reduce the number of germs on  your skin by washing with CHG (chlorahexidine gluconate) soap before surgery.  CHG is an antiseptic cleaner which kills germs and bonds with the skin to continue killing germs even after  washing. Please DO NOT use if you have an allergy to CHG or antibacterial soaps.  If your skin becomes reddened/irritated stop using the CHG and inform your nurse when you arrive at Short Stay. Do not shave (including legs and underarms) for at least 48 hours prior to the first CHG shower.  You may shave your face/neck.  Please follow these instructions carefully:  1.  Shower with CHG Soap the night before surgery and the  morning of surgery.  2.  If you choose to wash your hair, wash your hair first as usual with your normal  shampoo.  3.  After you shampoo, rinse your hair and body thoroughly to remove the shampoo.                             4.  Use CHG as you would any other liquid soap.  You can apply chg directly to the skin and wash.  Gently with a scrungie or clean washcloth.  5.  Apply the CHG Soap to your body ONLY FROM THE NECK DOWN.   Do   not use on face/ open                           Wound or open sores. Avoid contact with eyes, ears mouth and   genitals (private parts).                       Wash face,  Genitals (private parts) with your normal soap.             6.  Wash thoroughly, paying special attention to the area where your    surgery  will be performed.  7.  Thoroughly rinse your body with warm water from the neck down.  8.  DO NOT shower/wash with your normal soap after using and rinsing off the CHG Soap.                9.  Pat yourself dry with a clean towel.            10.  Wear clean pajamas.            11.  Place clean sheets on your bed the night of your first shower and do not  sleep with pets. Day of Surgery : Do not apply any lotions/deodorants the morning of surgery.  Please wear clean clothes to the hospital/surgery center.  FAILURE TO FOLLOW THESE INSTRUCTIONS MAY RESULT IN THE CANCELLATION OF YOUR SURGERY  PATIENT SIGNATURE_________________________________  NURSE  SIGNATURE__________________________________  ________________________________________________________________________   Sonia Sandoval  An incentive spirometer is a tool that can help keep your lungs clear and active. This tool measures how well you are filling your lungs with each breath. Taking long deep breaths may help reverse or decrease the chance of developing breathing (pulmonary) problems (especially infection) following:  A long period of time when you are unable to move or be active. BEFORE THE PROCEDURE   If the spirometer includes an indicator to show your best effort, your nurse or respiratory therapist will set it to a desired goal.  If possible, sit up straight or lean slightly forward. Try not to slouch.  Hold  the incentive spirometer in an upright position. INSTRUCTIONS FOR USE  1. Sit on the edge of your bed if possible, or sit up as far as you can in bed or on a chair. 2. Hold the incentive spirometer in an upright position. 3. Breathe out normally. 4. Place the mouthpiece in your mouth and seal your lips tightly around it. 5. Breathe in slowly and as deeply as possible, raising the piston or the ball toward the top of the column. 6. Hold your breath for 3-5 seconds or for as long as possible. Allow the piston or ball to fall to the bottom of the column. 7. Remove the mouthpiece from your mouth and breathe out normally. 8. Rest for a few seconds and repeat Steps 1 through 7 at least 10 times every 1-2 hours when you are awake. Take your time and take a few normal breaths between deep breaths. 9. The spirometer may include an indicator to show your best effort. Use the indicator as a goal to work toward during each repetition. 10. After each set of 10 deep breaths, practice coughing to be sure your lungs are clear. If you have an incision (the cut made at the time of surgery), support your incision when coughing by placing a pillow or rolled up towels firmly  against it. Once you are able to get out of bed, walk around indoors and cough well. You may stop using the incentive spirometer when instructed by your caregiver.  RISKS AND COMPLICATIONS  Take your time so you do not get dizzy or light-headed.  If you are in pain, you may need to take or ask for pain medication before doing incentive spirometry. It is harder to take a deep breath if you are having pain. AFTER USE  Rest and breathe slowly and easily.  It can be helpful to keep track of a log of your progress. Your caregiver can provide you with a simple table to help with this. If you are using the spirometer at home, follow these instructions: [place] IF:   You are having difficultly using the spirometer.  You have trouble using the spirometer as often as instructed.  Your pain medication is not giving enough relief while using the spirometer.  You develop fever of 100.5 F (38.1 C) or higher. SEEK IMMEDIATE MEDICAL CARE IF:   You cough up bloody sputum that had not been present before.  You develop fever of 102 F (38.9 C) or greater.  You develop worsening pain at or near the incision site. MAKE SURE YOU:   Understand these instructions.  Will watch your condition.  Will get help right away if you are not doing well or get worse. Document Released: 08/29/2006 Document Revised: 07/11/2011 Document Reviewed: 10/30/2006 ExitCare Patient Information 2014 ExitCare, [place].   ________________________________________________________________________  WHAT IS A BLOOD TRANSFUSION? Blood Transfusion Information  A transfusion is the replacement of blood or some of its parts. Blood is made up of multiple cells which provide different functions.  Red blood cells carry oxygen and are used for blood loss replacement.  White blood cells fight against infection.  Platelets control bleeding.  Plasma helps clot blood.  Other blood products are available for  specialized needs, such as hemophilia or other clotting disorders. BEFORE THE TRANSFUSION  Who gives blood for transfusions?   Healthy volunteers who are fully evaluated to make sure their blood is safe. This is blood bank blood. Transfusion therapy is the safest it has ever been  in the practice of medicine. Before blood is taken from a donor, a complete history is taken to make sure that person has no history of diseases nor engages in risky social behavior (examples are intravenous drug use or sexual activity with multiple partners). The donor's travel history is screened to minimize risk of transmitting infections, such as malaria. The donated blood is tested for signs of infectious diseases, such as HIV and hepatitis. The blood is then tested to be sure it is compatible with you in order to minimize the chance of a transfusion reaction. If you or a relative donates blood, this is often done in anticipation of surgery and is not appropriate for emergency situations. It takes many days to process the donated blood. RISKS AND COMPLICATIONS Although transfusion therapy is very safe and saves many lives, the main dangers of transfusion include:   Getting an infectious disease.  Developing a transfusion reaction. This is an allergic reaction to something in the blood you were given. Every precaution is taken to prevent this. The decision to have a blood transfusion has been considered carefully by your caregiver before blood is given. Blood is not given unless the benefits outweigh the risks. AFTER THE TRANSFUSION  Right after receiving a blood transfusion, you will usually feel much better and more energetic. This is especially true if your red blood cells have gotten low (anemic). The transfusion raises the level of the red blood cells which carry oxygen, and this usually causes an energy increase.  The nurse administering the transfusion will monitor you carefully for complications. HOME CARE  INSTRUCTIONS  No special instructions are needed after a transfusion. You may find your energy is better. Speak with your caregiver about any limitations on activity for underlying diseases you may have. SEEK MEDICAL CARE IF:   Your condition is not improving after your transfusion.  You develop redness or irritation at the intravenous (IV) site. SEEK IMMEDIATE MEDICAL CARE IF:  Any of the following symptoms occur over the next 12 hours:  Shaking chills.  You have a temperature by mouth above 102 F (38.9 C), not controlled by medicine.  Chest, back, or muscle pain.  People around you feel you are not acting correctly or are confused.  Shortness of breath or difficulty breathing.  Dizziness and fainting.  You get a rash or develop hives.  You have a decrease in urine output.  Your urine turns a dark color or changes to pink, red, or brown. Any of the following symptoms occur over the next 10 days:  You have a temperature by mouth above 102 F (38.9 C), not controlled by medicine.  Shortness of breath.  Weakness after normal activity.  The white part of the eye turns yellow (jaundice).  You have a decrease in the amount of urine or are urinating less often.  Your urine turns a dark color or changes to pink, red, or brown. Document Released: 04/15/2000 Document Revised: 07/11/2011 Document Reviewed: 12/03/2007 [facility] Patient Information 2014 [place], [place].  _______________________________________________________________________

## 2019-11-08 NOTE — Progress Notes (Addendum)
COVID Vaccine Completed: x2 Date COVID Vaccine completed: COVID vaccine manufacturer: [place]   PCP - [name], FNP Cardiologist - N/A  No Back Stimulator  Chest x-ray - 09-20-19  EKG - 09-24-19 Stress Test -  ECHO -  Cardiac Cath -   Sleep Study -  CPAP -   Fasting Blood Sugar -  Checks Blood Sugar _____ times a day  Blood Thinner Instructions: Aspirin Instructions: Last Dose:  Anesthesia review:   Patient denies shortness of breath, fever, cough and chest pain at PAT appointment   Patient verbalized understanding of instructions that were given to them at the PAT appointment. Patient was also instructed that they will need to review over the PAT instructions again at home before surgery.

## 2019-11-11 ENCOUNTER — Other Ambulatory Visit: Payer: Self-pay

## 2019-11-11 ENCOUNTER — Other Ambulatory Visit (HOSPITAL_COMMUNITY): Payer: BC Managed Care – PPO

## 2019-11-11 ENCOUNTER — Encounter (HOSPITAL_COMMUNITY): Payer: Self-pay

## 2019-11-11 ENCOUNTER — Encounter (HOSPITAL_COMMUNITY)
Admission: RE | Admit: 2019-11-11 | Discharge: 2019-11-11 | Disposition: A | Discharge status: Home / Self Care | Payer: BC Managed Care – PPO | Source: Ambulatory Visit | Attending: Surgery | Admitting: Surgery

## 2019-11-11 DIAGNOSIS — Z01812 Encounter for preprocedural laboratory examination: Secondary | ICD-10-CM | POA: Diagnosis not present

## 2019-11-11 LAB — CBC WITH DIFFERENTIAL/PLATELET
Abs Immature Granulocytes: 0.01 10*3/uL (ref 0.00–0.07)
Basophils Absolute: 0 10*3/uL (ref 0.0–0.1)
Basophils Relative: 0 %
Eosinophils Absolute: 0.1 10*3/uL (ref 0.0–0.5)
Eosinophils Relative: 2 %
HCT: 38.9 % (ref 36.0–46.0)
Hemoglobin: 12.5 g/dL (ref 12.0–15.0)
Immature Granulocytes: 0 %
Lymphocytes Relative: 23 %
Lymphs Abs: 0.8 10*3/uL (ref 0.7–4.0)
MCH: 30 pg (ref 26.0–34.0)
MCHC: 32.1 g/dL (ref 30.0–36.0)
MCV: 93.3 fL (ref 80.0–100.0)
Monocytes Absolute: 0.3 10*3/uL (ref 0.1–1.0)
Monocytes Relative: 8 %
Neutro Abs: 2.1 10*3/uL (ref 1.7–7.7)
Neutrophils Relative %: 67 %
Platelets: 209 10*3/uL (ref 150–400)
RBC: 4.17 MIL/uL (ref 3.87–5.11)
RDW: 13.1 % (ref 11.5–15.5)
WBC: 3.2 10*3/uL — ABNORMAL LOW (ref 4.0–10.5)
nRBC: 0 % (ref 0.0–0.2)

## 2019-11-11 LAB — HEMOGLOBIN A1C
Hgb A1c MFr Bld: 4.7 % — ABNORMAL LOW (ref 4.8–5.6)
Mean Plasma Glucose: 88.19 mg/dL

## 2019-11-11 LAB — COMPREHENSIVE METABOLIC PANEL
ALT: 26 U/L (ref 0–44)
AST: 33 U/L (ref 15–41)
Albumin: 4.4 g/dL (ref 3.5–5.0)
Alkaline Phosphatase: 55 U/L (ref 38–126)
Anion gap: 8 (ref 5–15)
BUN: 17 mg/dL (ref 8–23)
CO2: 26 mmol/L (ref 22–32)
Calcium: 9.3 mg/dL (ref 8.9–10.3)
Chloride: 106 mmol/L (ref 98–111)
Creatinine, Ser: 0.66 mg/dL (ref 0.44–1.00)
GFR calc Af Amer: >60 mL/min (ref >60)
GFR calc non Af Amer: >60 mL/min (ref >60)
Glucose, Bld: 107 mg/dL — ABNORMAL HIGH (ref 70–99)
Potassium: 4.5 mmol/L (ref 3.5–5.1)
Sodium: 140 mmol/L (ref 135–145)
Total Bilirubin: 0.7 mg/dL (ref 0.3–1.2)
Total Protein: 7.1 g/dL (ref 6.5–8.1)

## 2019-11-11 LAB — PROTIME-INR
INR: 1 (ref 0.8–1.2)
Prothrombin Time: 13 seconds (ref 11.4–15.2)

## 2019-11-18 NOTE — Progress Notes (Signed)
Patient left voice message saying that their son is in the ICU with MRSA (blood). She is scheduled for surgery on 7/23 and wants to make sure it is okay because she has been around him as he lives with them.  I reached out to Dr. Dema Severin regarding this matter and surgery will still take place.  I called her back to let her know.

## 2019-11-19 ENCOUNTER — Other Ambulatory Visit (HOSPITAL_COMMUNITY)
Admission: RE | Admit: 2019-11-19 | Discharge: 2019-11-19 | Disposition: A | Discharge status: Home / Self Care | Payer: BC Managed Care – PPO | Source: Ambulatory Visit | Attending: Surgery | Admitting: Surgery

## 2019-11-19 DIAGNOSIS — Z01812 Encounter for preprocedural laboratory examination: Secondary | ICD-10-CM | POA: Insufficient documentation

## 2019-11-19 DIAGNOSIS — Z20822 Contact with and (suspected) exposure to covid-19: Secondary | ICD-10-CM | POA: Insufficient documentation

## 2019-11-19 LAB — SARS CORONAVIRUS 2 (TAT 6-24 HRS): SARS Coronavirus 2: NEGATIVE

## 2019-11-21 MED ORDER — BUPIVACAINE LIPOSOME 1.3 % IJ SUSP
20.0000 mL | Freq: Once | INTRAMUSCULAR | Status: DC
  Filled 2019-11-21: qty 20

## 2019-11-22 ENCOUNTER — Other Ambulatory Visit: Payer: Self-pay

## 2019-11-22 ENCOUNTER — Inpatient Hospital Stay (HOSPITAL_COMMUNITY): Payer: BC Managed Care – PPO | Admitting: Anesthesiology

## 2019-11-22 ENCOUNTER — Inpatient Hospital Stay (HOSPITAL_COMMUNITY)
Admission: RE | Admit: 2019-11-22 | Discharge: 2019-11-24 | DRG: 331 | Disposition: A | Discharge status: Home / Self Care | Payer: BC Managed Care – PPO | Attending: Surgery | Admitting: Surgery

## 2019-11-22 ENCOUNTER — Encounter (HOSPITAL_COMMUNITY): Payer: Self-pay | Admitting: Surgery

## 2019-11-22 ENCOUNTER — Encounter (HOSPITAL_COMMUNITY)
Admission: RE | Disposition: A | Discharge status: Home / Self Care | Payer: Self-pay | Source: Home / Self Care | Attending: Surgery

## 2019-11-22 DIAGNOSIS — Z20822 Contact with and (suspected) exposure to covid-19: Secondary | ICD-10-CM | POA: Diagnosis present

## 2019-11-22 DIAGNOSIS — Z803 Family history of malignant neoplasm of breast: Secondary | ICD-10-CM | POA: Diagnosis not present

## 2019-11-22 DIAGNOSIS — Z853 Personal history of malignant neoplasm of breast: Secondary | ICD-10-CM | POA: Diagnosis not present

## 2019-11-22 DIAGNOSIS — Z801 Family history of malignant neoplasm of trachea, bronchus and lung: Secondary | ICD-10-CM | POA: Diagnosis not present

## 2019-11-22 DIAGNOSIS — C187 Malignant neoplasm of sigmoid colon: Principal | ICD-10-CM | POA: Diagnosis present

## 2019-11-22 DIAGNOSIS — Z9013 Acquired absence of bilateral breasts and nipples: Secondary | ICD-10-CM

## 2019-11-22 DIAGNOSIS — Z9049 Acquired absence of other specified parts of digestive tract: Secondary | ICD-10-CM

## 2019-11-22 HISTORY — PX: FLEXIBLE SIGMOIDOSCOPY: SHX5431

## 2019-11-22 LAB — TYPE AND SCREEN
ABO/RH(D): A POS
Antibody Screen: NEGATIVE

## 2019-11-22 SURGERY — COLECTOMY, PARTIAL, ROBOT-ASSISTED, LAPAROSCOPIC
Anesthesia: General | Site: Abdomen

## 2019-11-22 MED ORDER — LIDOCAINE 2% (20 MG/ML) 5 ML SYRINGE
INTRAMUSCULAR | Status: AC
  Filled 2019-11-22: qty 5

## 2019-11-22 MED ORDER — DEXAMETHASONE SODIUM PHOSPHATE 10 MG/ML IJ SOLN
INTRAMUSCULAR | Status: DC | PRN
  Administered 2019-11-22: 8 mg via INTRAVENOUS

## 2019-11-22 MED ORDER — MEPERIDINE HCL 50 MG/ML IJ SOLN
INTRAMUSCULAR | Status: AC
  Filled 2019-11-22: qty 1

## 2019-11-22 MED ORDER — FENTANYL CITRATE (PF) 100 MCG/2ML IJ SOLN: 25.0000 ug | INTRAMUSCULAR | Status: DC | PRN

## 2019-11-22 MED ORDER — BUPIVACAINE-EPINEPHRINE (PF) 0.25% -1:200000 IJ SOLN
INTRAMUSCULAR | Status: DC | PRN
  Administered 2019-11-22: 30 mL

## 2019-11-22 MED ORDER — ROCURONIUM BROMIDE 10 MG/ML (PF) SYRINGE
PREFILLED_SYRINGE | INTRAVENOUS | Status: DC | PRN
  Administered 2019-11-22: 50 mg via INTRAVENOUS
  Administered 2019-11-22 (×2): 10 mg via INTRAVENOUS

## 2019-11-22 MED ORDER — LIDOCAINE HCL 2 % IJ SOLN
INTRAMUSCULAR | Status: AC
  Filled 2019-11-22: qty 20

## 2019-11-22 MED ORDER — ONDANSETRON HCL 4 MG/2ML IJ SOLN
INTRAMUSCULAR | Status: AC
  Filled 2019-11-22: qty 2

## 2019-11-22 MED ORDER — FENTANYL CITRATE (PF) 250 MCG/5ML IJ SOLN
INTRAMUSCULAR | Status: AC
  Filled 2019-11-22: qty 5

## 2019-11-22 MED ORDER — ACETAMINOPHEN 500 MG PO TABS
1000.0000 mg | ORAL_TABLET | Freq: Four times a day (QID) | ORAL | Status: DC
  Administered 2019-11-23 – 2019-11-24 (×4): 1000 mg via ORAL
  Filled 2019-11-22 (×5): qty 2

## 2019-11-22 MED ORDER — SODIUM CHLORIDE 0.9 % IV SOLN
2.0000 g | INTRAVENOUS | Status: DC
  Filled 2019-11-22: qty 2

## 2019-11-22 MED ORDER — IBUPROFEN 400 MG PO TABS
600.0000 mg | ORAL_TABLET | Freq: Four times a day (QID) | ORAL | Status: DC | PRN
  Administered 2019-11-23: 600 mg via ORAL
  Filled 2019-11-22: qty 1

## 2019-11-22 MED ORDER — DIPHENHYDRAMINE HCL 12.5 MG/5ML PO ELIX: 12.5000 mg | ORAL_SOLUTION | Freq: Four times a day (QID) | ORAL | Status: DC | PRN

## 2019-11-22 MED ORDER — MIDAZOLAM HCL 2 MG/2ML IJ SOLN
INTRAMUSCULAR | Status: AC
  Filled 2019-11-22: qty 2

## 2019-11-22 MED ORDER — LACTATED RINGERS IR SOLN
Status: DC | PRN
  Administered 2019-11-22: 1000 mL

## 2019-11-22 MED ORDER — SUGAMMADEX SODIUM 200 MG/2ML IV SOLN
INTRAVENOUS | Status: DC | PRN
  Administered 2019-11-22: 133.2 mg via INTRAVENOUS

## 2019-11-22 MED ORDER — BUPIVACAINE LIPOSOME 1.3 % IJ SUSP
INTRAMUSCULAR | Status: DC | PRN
  Administered 2019-11-22: 20 mL

## 2019-11-22 MED ORDER — VITAMIN D 25 MCG (1000 UNIT) PO TABS
1000.0000 [IU] | ORAL_TABLET | Freq: Every day | ORAL | Status: DC
  Administered 2019-11-23 – 2019-11-24 (×2): 1000 [IU] via ORAL
  Filled 2019-11-22 (×2): qty 1

## 2019-11-22 MED ORDER — ANASTROZOLE 1 MG PO TABS
1.0000 mg | ORAL_TABLET | Freq: Every day | ORAL | Status: DC
  Administered 2019-11-23 – 2019-11-24 (×2): 1 mg via ORAL
  Filled 2019-11-22 (×2): qty 1

## 2019-11-22 MED ORDER — DEXAMETHASONE SODIUM PHOSPHATE 10 MG/ML IJ SOLN
INTRAMUSCULAR | Status: AC
  Filled 2019-11-22: qty 1

## 2019-11-22 MED ORDER — OXYCODONE HCL 5 MG/5ML PO SOLN: 5.0000 mg | Freq: Once | ORAL | Status: DC | PRN

## 2019-11-22 MED ORDER — LIDOCAINE 2% (20 MG/ML) 5 ML SYRINGE
INTRAMUSCULAR | Status: DC | PRN
Start: 2019-11-22 — End: 2019-11-22
  Administered 2019-11-22: 1.5 mg/kg/h via INTRAVENOUS

## 2019-11-22 MED ORDER — MEPERIDINE HCL 50 MG/ML IJ SOLN
6.2500 mg | INTRAMUSCULAR | Status: DC | PRN
  Administered 2019-11-22: 6.25 mg via INTRAVENOUS

## 2019-11-22 MED ORDER — ORAL CARE MOUTH RINSE: 15.0000 mL | Freq: Once | OROMUCOSAL | Status: AC

## 2019-11-22 MED ORDER — DIPHENHYDRAMINE HCL 50 MG/ML IJ SOLN: 12.5000 mg | Freq: Four times a day (QID) | INTRAMUSCULAR | Status: DC | PRN

## 2019-11-22 MED ORDER — ONDANSETRON HCL 4 MG/2ML IJ SOLN
INTRAMUSCULAR | Status: DC | PRN
  Administered 2019-11-22: 4 mg via INTRAVENOUS

## 2019-11-22 MED ORDER — PHENYLEPHRINE 40 MCG/ML (10ML) SYRINGE FOR IV PUSH (FOR BLOOD PRESSURE SUPPORT)
PREFILLED_SYRINGE | INTRAVENOUS | Status: AC
  Filled 2019-11-22: qty 10

## 2019-11-22 MED ORDER — ENSURE SURGERY PO LIQD: 237.0000 mL | Freq: Two times a day (BID) | ORAL | Status: DC

## 2019-11-22 MED ORDER — ONDANSETRON HCL 4 MG PO TABS: 4.0000 mg | ORAL_TABLET | Freq: Four times a day (QID) | ORAL | Status: DC | PRN

## 2019-11-22 MED ORDER — ALVIMOPAN 12 MG PO CAPS
12.0000 mg | ORAL_CAPSULE | ORAL | Status: AC
  Administered 2019-11-22: 12 mg via ORAL
  Filled 2019-11-22: qty 1

## 2019-11-22 MED ORDER — CHLORHEXIDINE GLUCONATE 0.12 % MT SOLN
15.0000 mL | Freq: Once | OROMUCOSAL | Status: AC
  Administered 2019-11-22: 15 mL via OROMUCOSAL

## 2019-11-22 MED ORDER — HEPARIN SODIUM (PORCINE) 5000 UNIT/ML IJ SOLN
5000.0000 [IU] | Freq: Once | INTRAMUSCULAR | Status: AC
  Administered 2019-11-22: 5000 [IU] via SUBCUTANEOUS
  Filled 2019-11-22: qty 1

## 2019-11-22 MED ORDER — CHLORHEXIDINE GLUCONATE CLOTH 2 % EX PADS: 6.0000 | MEDICATED_PAD | Freq: Once | CUTANEOUS | Status: DC

## 2019-11-22 MED ORDER — PROPOFOL 10 MG/ML IV BOLUS
INTRAVENOUS | Status: DC | PRN
  Administered 2019-11-22: 100 mg via INTRAVENOUS

## 2019-11-22 MED ORDER — FENTANYL CITRATE (PF) 250 MCG/5ML IJ SOLN
INTRAMUSCULAR | Status: DC | PRN
  Administered 2019-11-22: 50 ug via INTRAVENOUS
  Administered 2019-11-22: 100 ug via INTRAVENOUS
  Administered 2019-11-22 (×2): 50 ug via INTRAVENOUS

## 2019-11-22 MED ORDER — FLUORESCEIN SODIUM 10 % IV SOLN
INTRAVENOUS | Status: DC | PRN
Start: 2019-11-22 — End: 2019-11-22
  Administered 2019-11-22: 3 mL via INTRAVENOUS

## 2019-11-22 MED ORDER — ACETAMINOPHEN 500 MG PO TABS
1000.0000 mg | ORAL_TABLET | ORAL | Status: AC
  Administered 2019-11-22: 1000 mg via ORAL
  Filled 2019-11-22: qty 2

## 2019-11-22 MED ORDER — 0.9 % SODIUM CHLORIDE (POUR BTL) OPTIME
TOPICAL | Status: DC | PRN
  Administered 2019-11-22: 2000 mL

## 2019-11-22 MED ORDER — ONDANSETRON HCL 4 MG/2ML IJ SOLN: 4.0000 mg | Freq: Four times a day (QID) | INTRAMUSCULAR | Status: DC | PRN

## 2019-11-22 MED ORDER — HEPARIN SODIUM (PORCINE) 5000 UNIT/ML IJ SOLN
5000.0000 [IU] | Freq: Three times a day (TID) | INTRAMUSCULAR | Status: DC
  Administered 2019-11-22 – 2019-11-24 (×5): 5000 [IU] via SUBCUTANEOUS
  Filled 2019-11-22 (×5): qty 1

## 2019-11-22 MED ORDER — PROPOFOL 500 MG/50ML IV EMUL
INTRAVENOUS | Status: DC | PRN
  Administered 2019-11-22: 75 ug/kg/min via INTRAVENOUS

## 2019-11-22 MED ORDER — TRAMADOL HCL 50 MG PO TABS: 50.0000 mg | ORAL_TABLET | Freq: Four times a day (QID) | ORAL | Status: DC | PRN

## 2019-11-22 MED ORDER — MIDAZOLAM HCL 5 MG/5ML IJ SOLN
INTRAMUSCULAR | Status: DC | PRN
  Administered 2019-11-22 (×2): 1 mg via INTRAVENOUS

## 2019-11-22 MED ORDER — BUPIVACAINE-EPINEPHRINE (PF) 0.25% -1:200000 IJ SOLN
INTRAMUSCULAR | Status: AC
  Filled 2019-11-22: qty 30

## 2019-11-22 MED ORDER — ALVIMOPAN 12 MG PO CAPS
12.0000 mg | ORAL_CAPSULE | Freq: Two times a day (BID) | ORAL | Status: DC
  Administered 2019-11-23: 12 mg via ORAL
  Filled 2019-11-22: qty 1

## 2019-11-22 MED ORDER — ROCURONIUM BROMIDE 10 MG/ML (PF) SYRINGE
PREFILLED_SYRINGE | INTRAVENOUS | Status: AC
  Filled 2019-11-22: qty 10

## 2019-11-22 MED ORDER — OXYCODONE HCL 5 MG PO TABS: 5.0000 mg | ORAL_TABLET | Freq: Once | ORAL | Status: DC | PRN

## 2019-11-22 MED ORDER — ALUM & MAG HYDROXIDE-SIMETH 200-200-20 MG/5ML PO SUSP: 30.0000 mL | Freq: Four times a day (QID) | ORAL | Status: DC | PRN

## 2019-11-22 MED ORDER — FERROUS SULFATE 325 (65 FE) MG PO TABS
325.0000 mg | ORAL_TABLET | Freq: Every day | ORAL | Status: DC
  Administered 2019-11-23 – 2019-11-24 (×2): 325 mg via ORAL
  Filled 2019-11-22 (×2): qty 1

## 2019-11-22 MED ORDER — LACTATED RINGERS IV SOLN: INTRAVENOUS | Status: DC

## 2019-11-22 MED ORDER — HYDROMORPHONE HCL 1 MG/ML IJ SOLN: 0.5000 mg | INTRAMUSCULAR | Status: DC | PRN

## 2019-11-22 MED ORDER — TRAMADOL HCL 50 MG PO TABS: 50.0000 mg | ORAL_TABLET | Freq: Four times a day (QID) | ORAL | 0 refills | Status: DC | PRN

## 2019-11-22 MED ORDER — PANTOPRAZOLE SODIUM 40 MG PO TBEC
40.0000 mg | DELAYED_RELEASE_TABLET | Freq: Every day | ORAL | Status: DC
  Administered 2019-11-23 – 2019-11-24 (×2): 40 mg via ORAL
  Filled 2019-11-22 (×2): qty 1

## 2019-11-22 SURGICAL SUPPLY — 107 items
ADH SKN CLS APL DERMABOND .7 (GAUZE/BANDAGES/DRESSINGS) ×2
APPLIER CLIP 5 13 M/L LIGAMAX5 (MISCELLANEOUS)
APPLIER CLIP ROT 10 11.4 M/L (STAPLE)
APR CLP MED LRG 11.4X10 (STAPLE)
APR CLP MED LRG 5 ANG JAW (MISCELLANEOUS)
BLADE EXTENDED COATED 6.5IN (ELECTRODE) ×4 IMPLANT
CANNULA REDUC XI 12-8 STAPL (CANNULA) ×3
CANNULA REDUC XI 12-8MM STAPL (CANNULA) ×1
CANNULA REDUCER 12-8 DVNC XI (CANNULA) ×2 IMPLANT
CELLS DAT CNTRL 66122 CELL SVR (MISCELLANEOUS) IMPLANT
CLIP APPLIE 5 13 M/L LIGAMAX5 (MISCELLANEOUS) IMPLANT
CLIP APPLIE ROT 10 11.4 M/L (STAPLE) IMPLANT
CLIP VESOLOCK LG 6/CT PURPLE (CLIP) IMPLANT
CLIP VESOLOCK MED LG 6/CT (CLIP) IMPLANT
COVER SURGICAL LIGHT HANDLE (MISCELLANEOUS) ×8 IMPLANT
COVER TIP SHEARS 8 DVNC (MISCELLANEOUS) ×2 IMPLANT
COVER TIP SHEARS 8MM DA VINCI (MISCELLANEOUS) ×4
COVER WAND RF STERILE (DRAPES) IMPLANT
DECANTER SPIKE VIAL GLASS SM (MISCELLANEOUS) ×4 IMPLANT
DERMABOND ADVANCED (GAUZE/BANDAGES/DRESSINGS) ×2
DERMABOND ADVANCED .7 DNX12 (GAUZE/BANDAGES/DRESSINGS) IMPLANT
DEVICE TROCAR PUNCTURE CLOSURE (ENDOMECHANICALS) IMPLANT
DRAIN CHANNEL 19F RND (DRAIN) ×2 IMPLANT
DRAPE ARM DVNC X/XI (DISPOSABLE) ×8 IMPLANT
DRAPE COLUMN DVNC XI (DISPOSABLE) ×2 IMPLANT
DRAPE DA VINCI XI ARM (DISPOSABLE) ×16
DRAPE DA VINCI XI COLUMN (DISPOSABLE) ×4
DRAPE SURG IRRIG POUCH 19X23 (DRAPES) ×4 IMPLANT
DRSG OPSITE POSTOP 4X10 (GAUZE/BANDAGES/DRESSINGS) IMPLANT
DRSG OPSITE POSTOP 4X6 (GAUZE/BANDAGES/DRESSINGS) ×2 IMPLANT
DRSG OPSITE POSTOP 4X8 (GAUZE/BANDAGES/DRESSINGS) IMPLANT
DRSG TEGADERM 2-3/8X2-3/4 SM (GAUZE/BANDAGES/DRESSINGS) ×10 IMPLANT
DRSG TEGADERM 4X4.75 (GAUZE/BANDAGES/DRESSINGS) ×2 IMPLANT
ELECT REM PT RETURN 15FT ADLT (MISCELLANEOUS) ×4 IMPLANT
ENDOLOOP SUT PDS II  0 18 (SUTURE)
ENDOLOOP SUT PDS II 0 18 (SUTURE) IMPLANT
EVACUATOR SILICONE 100CC (DRAIN) ×2 IMPLANT
GAUZE SPONGE 2X2 8PLY STRL LF (GAUZE/BANDAGES/DRESSINGS) ×2 IMPLANT
GAUZE SPONGE 4X4 12PLY STRL (GAUZE/BANDAGES/DRESSINGS) IMPLANT
GLOVE BIO SURGEON STRL SZ7.5 (GLOVE) ×12 IMPLANT
GLOVE INDICATOR 8.0 STRL GRN (GLOVE) ×12 IMPLANT
GOWN STRL REUS W/TWL XL LVL3 (GOWN DISPOSABLE) ×20 IMPLANT
GRASPER SUT TROCAR 14GX15 (MISCELLANEOUS) IMPLANT
HOLDER FOLEY CATH W/STRAP (MISCELLANEOUS) ×4 IMPLANT
KIT PROCEDURE DA VINCI SI (MISCELLANEOUS) ×4
KIT PROCEDURE DVNC SI (MISCELLANEOUS) IMPLANT
KIT TURNOVER KIT A (KITS) IMPLANT
NDL INSUFFLATION 14GA 120MM (NEEDLE) ×2 IMPLANT
NEEDLE INSUFFLATION 14GA 120MM (NEEDLE) ×4 IMPLANT
PACK CARDIOVASCULAR III (CUSTOM PROCEDURE TRAY) ×4 IMPLANT
PACK COLON (CUSTOM PROCEDURE TRAY) ×4 IMPLANT
PAD POSITIONING PINK XL (MISCELLANEOUS) ×4 IMPLANT
PENCIL SMOKE EVACUATOR (MISCELLANEOUS) IMPLANT
PORT LAP GEL ALEXIS MED 5-9CM (MISCELLANEOUS) ×4 IMPLANT
PROTECTOR NERVE ULNAR (MISCELLANEOUS) ×6 IMPLANT
RELOAD STAPLE 60 3.5 BLU DVNC (STAPLE) IMPLANT
RELOAD STAPLER 3.5X60 BLU DVNC (STAPLE) ×4 IMPLANT
RETRACTOR WND ALEXIS 18 MED (MISCELLANEOUS) IMPLANT
RTRCTR WOUND ALEXIS 18CM MED (MISCELLANEOUS)
SCISSORS LAP 5X35 DISP (ENDOMECHANICALS) IMPLANT
SEAL CANN UNIV 5-8 DVNC XI (MISCELLANEOUS) ×8 IMPLANT
SEAL XI 5MM-8MM UNIVERSAL (MISCELLANEOUS) ×16
SEALER VESSEL DA VINCI XI (MISCELLANEOUS) ×4
SEALER VESSEL EXT DVNC XI (MISCELLANEOUS) ×2 IMPLANT
SET IRRIG TUBING LAPAROSCOPIC (IRRIGATION / IRRIGATOR) ×4 IMPLANT
SLEEVE ADV FIXATION 5X100MM (TROCAR) IMPLANT
SOLUTION ELECTROLUBE (MISCELLANEOUS) ×4 IMPLANT
SPONGE GAUZE 2X2 STER 10/PKG (GAUZE/BANDAGES/DRESSINGS)
STAPLER 60 DA VINCI SURE FORM (STAPLE) ×4
STAPLER 60 SUREFORM DVNC (STAPLE) IMPLANT
STAPLER CANNULA SEAL DVNC XI (STAPLE) ×2 IMPLANT
STAPLER CANNULA SEAL XI (STAPLE) ×4
STAPLER ECHELON POWER CIR 29 (STAPLE) ×2 IMPLANT
STAPLER RELOAD 3.5X60 BLU DVNC (STAPLE) ×4
STAPLER RELOAD 3.5X60 BLUE (STAPLE) ×8
STAPLER SHEATH (SHEATH)
STAPLER SHEATH ENDOWRIST DVNC (SHEATH) ×2 IMPLANT
STOPCOCK 4 WAY LG BORE MALE ST (IV SETS) ×8 IMPLANT
SURGILUBE 2OZ TUBE FLIPTOP (MISCELLANEOUS) ×4 IMPLANT
SUT MNCRL AB 4-0 PS2 18 (SUTURE) ×6 IMPLANT
SUT PDS AB 1 CT1 27 (SUTURE) IMPLANT
SUT PDS AB 1 TP1 96 (SUTURE) IMPLANT
SUT PROLENE 0 CT 2 (SUTURE) IMPLANT
SUT PROLENE 2 0 KS (SUTURE) ×4 IMPLANT
SUT PROLENE 2 0 SH DA (SUTURE) IMPLANT
SUT SILK 2 0 (SUTURE)
SUT SILK 2 0 SH CR/8 (SUTURE) IMPLANT
SUT SILK 2-0 18XBRD TIE 12 (SUTURE) IMPLANT
SUT SILK 3 0 (SUTURE) ×4
SUT SILK 3 0 SH CR/8 (SUTURE) ×4 IMPLANT
SUT SILK 3-0 18XBRD TIE 12 (SUTURE) ×2 IMPLANT
SUT V-LOC BARB 180 2/0GR6 GS22 (SUTURE)
SUT VIC AB 3-0 SH 18 (SUTURE) IMPLANT
SUT VIC AB 3-0 SH 27 (SUTURE)
SUT VIC AB 3-0 SH 27XBRD (SUTURE) IMPLANT
SUT VICRYL 0 UR6 27IN ABS (SUTURE) ×4 IMPLANT
SUTURE V-LC BRB 180 2/0GR6GS22 (SUTURE) IMPLANT
SYR 10ML LL (SYRINGE) ×4 IMPLANT
SYS LAPSCP GELPORT 120MM (MISCELLANEOUS)
SYSTEM LAPSCP GELPORT 120MM (MISCELLANEOUS) IMPLANT
TAPE UMBILICAL COTTON 1/8X30 (MISCELLANEOUS) ×2 IMPLANT
TOWEL OR NON WOVEN STRL DISP B (DISPOSABLE) ×4 IMPLANT
TRAY FOLEY MTR SLVR 14FR STAT (SET/KITS/TRAYS/PACK) ×2 IMPLANT
TROCAR ADV FIXATION 5X100MM (TROCAR) ×4 IMPLANT
TUBING CONNECTING 10 (TUBING) ×6 IMPLANT
TUBING CONNECTING 10' (TUBING) ×2
TUBING INSUFFLATION 10FT LAP (TUBING) ×4 IMPLANT

## 2019-11-22 NOTE — Plan of Care (Signed)
POC initiated 

## 2019-11-22 NOTE — Anesthesia Preprocedure Evaluation (Signed)
Anesthesia Evaluation  Patient identified by MRN, date of birth, ID band Patient awake    Reviewed: Allergy & Precautions, H&P , NPO status , Patient's Chart, lab work & pertinent test results  History of Anesthesia Complications (+) PONV and history of anesthetic complications  Airway Mallampati: II   Neck ROM: full    Dental   Pulmonary neg pulmonary ROS,    breath sounds clear to auscultation       Cardiovascular negative cardio ROS   Rhythm:regular Rate:Normal     Neuro/Psych  Headaches,    GI/Hepatic Colon CA   Endo/Other    Renal/GU      Musculoskeletal   Abdominal   Peds  Hematology   Anesthesia Other Findings   Reproductive/Obstetrics H/o breast CA s/p R mastectomy                             Anesthesia Physical Anesthesia Plan  ASA: II  Anesthesia Plan: General   Post-op Pain Management:    Induction: Intravenous  PONV Risk Score and Plan: 4 or greater and Ondansetron, Dexamethasone, Midazolam and Treatment may vary due to age or medical condition  Airway Management Planned: Oral ETT  Additional Equipment:   Intra-op Plan:   Post-operative Plan: Extubation in OR  Informed Consent: I have reviewed the patients History and Physical, chart, labs and discussed the procedure including the risks, benefits and alternatives for the proposed anesthesia with the patient or authorized representative who has indicated his/her understanding and acceptance.       Plan Discussed with: CRNA, Anesthesiologist and Surgeon  Anesthesia Plan Comments:         Anesthesia Quick Evaluation

## 2019-11-22 NOTE — Anesthesia Procedure Notes (Addendum)
Procedure Name: Intubation Date/Time: 11/22/2019 2:32 PM Performed by: Anne Fu, CRNA Pre-anesthesia Checklist: Patient identified, Emergency Drugs available, Suction available, Patient being monitored and Timeout performed Patient Re-evaluated:Patient Re-evaluated prior to induction Oxygen Delivery Method: Circle system utilized Preoxygenation: Pre-oxygenation with 100% oxygen Induction Type: IV induction Ventilation: Mask ventilation without difficulty Laryngoscope Size: Mac and 4 Tube type: Oral Tube size: 7.5 mm Number of attempts: 1 Airway Equipment and Method: Stylet Placement Confirmation: ETT inserted through vocal cords under direct vision,  positive ETCO2 and breath sounds checked- equal and bilateral Secured at: 19 cm Tube secured with: Tape Dental Injury: Teeth and Oropharynx as per pre-operative assessment

## 2019-11-22 NOTE — H&P (Signed)
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 She underwent workup and was found to have a mass in the rectosigmoid colon that was biopsied and tattooed. This returned invasive adenocarcinoma. This was 18 cm from the anal verge. She underwent staging CT chest/abdomen/pelvis that demonstrated small nonspecific pulmonary nodules up to 4 mm in size. Suspicious lymphadenopathy within the sigmoid mesocolon. She was referred to Dr. Burr Medico with medical oncology who had seen her and presented her case at our multidisciplinary tumor board. The consensus recommendation after reviewing all of her case details was that the tumor resided in the distal sigmoid as opposed to rectum proper. On her colonoscopy, this was proximal to the rectal folds and appears in distal sigmoid. It also appears that this tattoos distal to the lesion on the pictures and her colonoscopy. She was started on iron infusion and improved. She is discharged. She presents my office today for evaluation. She has been doing reasonably well albeit with some fatigue. She denies nausea/vomiting or any bright red blood in her stool this time. She denies any abdominal pain.  PMH: Breast cancer, back pain  PSH: Bilateral mastectomy; back surgery via posterior approach. C-section 2.  FHx: She reports on the maternal side of her family, and aunt and cousin both with breast cancer. She denies any known family history of breast, colorectal, or ovarian cancer  Social: Denies use of tobacco/drugs; reports 1-2 glasses of wine on Saturday and Sunday. She is happily retired.  ROS: A comprehensive 10 system review of systems was completed with the patient and pertinent findings as noted above.  Past Medical History:  Diagnosis Date  . Allergies 1995   Per  patient report 11/14/18  . Breast cancer ([place])   . Breast cancer, right breast ([place]) 09/2017   S/P mastectomy 12/05/2017  . Family history of breast cancer   . Headache 1968   per patient report, started as a teenager  . 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  . Osteopenia 03/2018   Seen on DEXA Scan  . PONV (postoperative nausea and vomiting)     Past Surgical History:  Procedure Laterality Date  . BACK SURGERY  2003   Patient will occassional back pain since 2003  . BIOPSY  09/23/2019   Procedure: BIOPSY;  Surgeon: Otis Brace, MD;  Location: [facility];  Service: Gastroenterology;;  EGD and COLON  . BREAST BIOPSY Right 09/2017  . [place]; 1992  . COLONOSCOPY WITH PROPOFOL N/A 09/23/2019   Procedure: COLONOSCOPY WITH PROPOFOL and EGD;  Surgeon: Otis Brace, MD;  Location: [facility];  Service: Gastroenterology;  Laterality: N/A;  . DILATION AND CURETTAGE OF UTERUS    . ESOPHAGOGASTRODUODENOSCOPY N/A 09/23/2019   Procedure: ESOPHAGOGASTRODUODENOSCOPY (EGD);  Surgeon: Otis Brace, MD;  Location: [facility];  Service: Gastroenterology;  Laterality: N/A;  . LUMBAR [place] SURGERY  2003  . MASTECTOMY Left 12/05/2017   PROPHYLACTIC MASTECTOMY  . MASTECTOMY COMPLETE / SIMPLE W/ SENTINEL NODE BIOPSY Right 12/05/2017   WITH RADIOACTIVE SEED TARGETED RIGHT AXIILARY LYMPH NODE EXCISION AND RIGHT SENTINEL LYMPH NODE BIOPSY,   . MASTECTOMY WITH RADIOACTIVE SEED GUIDED EXCISION AND AXILLARY SENTINEL LYMPH NODE BIOPSY Bilateral 12/05/2017   Procedure: RIGHT SIMPLE  MASTECTOMY WITH RADIOACTIVE SEED TARGETED RIGHT AXIILARY LYMPH NODE EXCISION AND RIGHT SENTINEL LYMPH NODE BIOPSY, LEFT PROPHYLACTIC MASTECTOMY;  Surgeon: [name], MD;  Location: [facility];  Service: General;  Laterality: Bilateral;  . SUBMUCOSAL TATTOO INJECTION  09/23/2019   Procedure:  SUBMUCOSAL TATTOO INJECTION;  Surgeon: Otis Brace, MD;  Location: [facility];  Service: Gastroenterology;;    Family History  Problem Relation Age of Onset  . Dementia Mother   . Lung cancer Father   . Cancer Maternal Aunt 60       breast cancer   . Cancer Cousin        maternal first cousin with breast cancer in her 10s  . Heart disease Maternal Grandfather     Social:  reports that she has never smoked. She has never used smokeless tobacco. She reports current alcohol use of about 14.0 standard drinks of alcohol per week. She reports that she does not use drugs.  Allergies:  Allergies  Allergen Reactions  . Codeine Nausea And Vomiting and Other (See Comments)    headache    Medications: I have reviewed the patient's current medications.  No results found for this or any previous visit (from the past 48 hour(s)).  No results found.  ROS - all of the below systems have been reviewed with the patient and positives are indicated with bold text General: chills, fever or night sweats Eyes: blurry vision or double vision ENT: epistaxis or sore throat Allergy/Immunology: itchy/watery eyes or nasal congestion Hematologic/Lymphatic: bleeding problems, blood clots or swollen lymph nodes Endocrine: temperature intolerance or unexpected weight changes Breast: new or changing breast lumps or nipple discharge Resp: cough, shortness of breath, or wheezing CV: chest pain or dyspnea on exertion GI: as per HPI GU: dysuria, trouble voiding, or hematuria MSK: joint pain or joint stiffness Neuro: TIA or stroke symptoms Derm: pruritus and skin lesion changes Psych: anxiety and depression  PE There were no vitals taken for this visit. Constitutional: NAD; conversant Lungs: Normal respiratory effort CV: RRR GI: Abd soft nontender, nondistended, ; no palpable hepatosplenomegaly MSK: Normal range of motion of extremities Psychiatric: Appropriate affect; alert and oriented  x3  No results found for this or any previous visit (from the past 48 hour(s)).  No results found.   A/P: Sonia Sandoval is a very pleasant 27yoF with hx of breast ca and back pain - here for evaluation of newly diagnosed rectosigmoid colon cancer - favored distal sigmoid on her imaging and colonoscopy  CT CAP shows no definitive evidence of metastatic disease but findings that would require surveillance including small pulmonary nodules. These have been reviewed at [facility]. There are some mesenteric lymphadenopathy associated with this sigmoid mesocolon.  -The anatomy and physiology of the GI tract was discussed at length with the patient. The pathophysiology of colon cancer was discussed at length with associated pictures. -We reviewed robotic assisted sigmoidectomy/low anterior resection, flexible sigmoidoscopy; scenarios where open surgery may be necessary. -The planned procedures, material risks (including, but not limited to, pain, bleeding, infection, scarring, need for blood transfusion, damage to surrounding structures- blood vessels/nerves/viscus/organs, damage to ureter, urine leak, leak from anastomosis, need for additional procedures, worsening of pre-existing medical conditions, need for stoma which may be permanent, hernia, recurrence of cancer, DVT/PE, pneumonia, heart attack, stroke, death) benefits and alternatives to surgery were discussed at length. We also reviewed scenarios where adjuvant chemotherapy may be indicated/offered. The patient's questions were answered to her satisfaction, she voiced understanding and elected to proceed with  surgery. Additionally, we discussed typical postoperative expectations and the recovery process.  Sharon [place]. Dema Severin, M.D. [facility]

## 2019-11-22 NOTE — Op Note (Signed)
PATIENT: Sonia Sandoval  69 y.o. female  Patient Care Team: [name], [place] as PCP - General (Nurse Practitioner) [name], MD as Consulting Physician (General Surgery) Magrinat, [name], MD as Consulting Physician (Oncology) [name], MD as Consulting Physician (Radiation Oncology) [name], RN as Registered Nurse [name], MD as Referring Physician (Dermatology) [name], RN as Oncology Nurse Navigator [name], MD as Consulting Physician (Hematology) [name], MD as Consulting Physician (General Surgery) [name], MD as Consulting Physician (Hematology)  PREOP DIAGNOSIS: COLON CANCER  POSTOP DIAGNOSIS: COLON CANCER  PROCEDURE: 1. Robotic low anterior resection with double stapled colorectal anastomosis 2. Flexible sigmoidoscopy 3. Bilateral transversus abdominus plane blocks  SURGEON: Sharon Mt. Dema Severin, MD  ASSISTANT: Leighton Ruff, MD  ANESTHESIA: General endotracheal  EBL: 50 mL Total I/O In: 1000 [I.V.:1000] Out: 175 [Urine:150; Blood:25]  DRAINS: None  SPECIMEN: 1. Rectosigmoid colon - open end proximal 2. Distal donut ('final distal margin')  COUNTS: Sponge, needle and instrument counts were reported correct x2  FINDINGS: Pelvic varices and retroperitoneal varices were appreciated.  Ectatic tortuous iliac vessels as well.  No obvious metastatic disease on visceral parietal peritoneum or liver.  Tattoo located on the distal sigmoid colon just to where the tinea began to splay.  This was just distal to the mass which lie just proximal to the tattoo. 29 mm EEA colorectal anastomosis fashioned 13 cm from the anal verge by flexible sigmoidoscopy.  STATEMENT OF MEDICAL NECESSITY: Sonia Sandoval is a 69 y.o. female with history of breast cancer (prior bilateral mastectomies and radiation) who presented to the hospital recently with bright red blood per rectum.  She underwent work-up and was found to have a mass  in the rectosigmoid colon that was biopsied and tattooed.  This returned invasive adenocarcinoma.  This was noted to be approximately 18 cm from the anal verge by endoscopist.  She underwent staging CT chest/abdomen/pelvis that showed no evidence of definitive metastatic disease but there were some small nonspecific pulmonary nodules up to 4 mm in size.  There is also some suspicious appearing lymphadenopathy within the sigmoid mesocolon.  She met with medical oncology and had her case presented at our multidisciplinary tumor board.  Consensus recommendation was for surveillance of the small lung nodules and surgery.  We had met in the office and discussed everything at length.  We discussed options going forward and she opted to pursue surgery.  Please refer to notes elsewhere for details regarding this discussion.  NARRATIVE: Informed consent was verified. She was taken to the operating room, placed supine on the operating table and SCD's were applied. General endotracheal anesthesia was induced without difficulty. The patient was then positioned in the lithotomy position with Allen stirrups.  A foley catheter was then placed by nursing under sterile conditions. The abdomen was then prepped and draped in the standard sterile fashion. Surgical timeout was called indicating the correct patient, procedure, positioning and need for preoperative antibiotics.   An OG tube was placed by anesthesia and confirmed to be to suction.  At Palmer's point, a stab incision was created and the Veress needle was introduced into the peritoneal cavity on the first attempt.  Intraperitoneal location was confirmed by the aspiration and saline drop test.  Pneumoperitoneum was established to a maximum pressure of 15 mmHg using CO2.  Following this, the abdomen was marked for planned trocar sites.  Just to the right and cephalad to the umbilicus, an 8 mm incision  was created and an 8 mm blunt tipped robotic trocar was cautiously  placed into the peritoneal cavity.  The laparoscope was inserted and demonstrated no evidence of trocar site nor Veress needle site complications.  The Veress needle was removed.  Bilateral transversus abdominis plane blocks were then created using a dilute mixture of Exparel with Marcaine.  3 additional 8 mm robotic trochars were placed under direct visualization roughly in a line extending from the right ASIS towards the left upper quadrant. The bladder was inspected and noted to be at/below the pubic symphysis.  Staying 3 fingerbreadths above the pubic symphysis, an incision was created and the 12 mm robotic trocar inserted directed cephalad into the peritoneal cavity under direct visualization.  An additional 5 mm assist port was placed in the right lateral abdomen under direct visualization.  The abdomen was surveyed and there was an obvious appearing tattoo in the distal sigmoid colon at the junction of the rectum and this was located just distal to a palpable mass within the lumen of the colon.  This would not fix anything else in the abdomen.  There is no evidence of metastatic disease elsewhere in the abdomen was surveyed.  She was positioned in Trendelenburg with some left side up.  Small bowel was carefully retracted out of the pelvis.  The robot was then docked and I went to the console.   The sigmoid colon was readily identified.  Attachments of the sigmoid colon were taken down from the intersigmoid fossa.  The rectosigmoid colon was grasped and elevated anteriorly.  Beginning with a medial to lateral approach, the peritoneum overlying the presacral space was carefully incised.  The TME plane was readily gained working in a plane between the fascia propria of the rectum and the presacral fascia.  Hypogastric nerves were seen going along the the presacral fascia and were protected free of injury.  Working more proximally, the mesorectum and sigmoid mesentery were carefully mobilized off of the  peritoneum.  The left ureter was identified and protected free of injury.  The left gonadal vessels were identified and protected.  These were both swept "down."  The superior hemorrhoidal and IMA pedicles were identified. Further mesocolon was mobilized proximally staying in this plane between the retroperitoneum proper and the mesocolon. Attention was then turned to the lateral portion of dissection.  The sigmoid colon was retracted to the right.  The sigmoid colon was fully mobilized and working proximal to the diseased segment of colon, the descending colon was mobilized by incising the white line of Toldt.  The associated mesocolon was also mobilized medially.  The left ureter again was confirmed to be well away for plan dissection and well away from the vasculature which had been dissected medially.  The rectosigmoid colon was elevated anteriorly. The left ureter was re-identified. The IMA was clear of this. The IMA was then divided with the vessel sealer. The stump was inspected and noted to be completely hemostatic with a good seal.  The mesentery was divided out to the point of planned proximal division. There was clearly plenty of reach into the pelvis and therefore splenic flexure mobilization was not necessary.  Working more distally, the rectum was identified where the tinea had splayed and there were loss of appendices epiploica.  This also corresponded to a location overlying the sacral promontory.  Anatomically, this clearly represents the proximal rectum.  The mesentery out to this level was then cleared using the vessel sealer. The distal point of transection  on the proximal rectum was identified.   A 65 mm blue load robotic stapler was then placed through the 12 mm port and introduced into the peritoneal cavity.  The rectum was divided with the stapler after ensuring staple line free of surrounding structures.  The stump was intact and healthy in appearance.   Attention was turned to  performing a perfusion test. ICG was administered by anesthesia and at the level of the cleared mesentery proximally, there was excellent uptake of the tracer.  The rectum was also well perfused in appearance.  There was a visible pulse in the mesentery out to the level of the cleared colon at the level of the proximal sigmoid/descending colon junction.  This colon is also supple and healthy in appearance without any thickening.  This reached into the pelvis without any difficulty and remained in that location without any tension. A locking grasper was then placed on the sigmoid staple line.   Attention was turned to the extracorporeal portion of the procedure.  The robot was undocked.  I scrubbed back in.  Using the 12 mm trocar site, a Pfannenstiel incision was created and incorporated the fascial opening through the 12 mm port site.  The rectus fascia was incised and then elevated.  The rectus muscle was mobilized free of the overlying fascia.  The peritoneum was incised in the midline well above the location of the bladder.  An [place] wound protector was placed.  Towels were placed around the field.  The divided colon was passed through the wound protector.  The point of proximal division was identified and was again on a healthy segment of supple colon with a palpable pulse in the mesentery. This was pink in color.  A pursestring device was applied.  A 2-0 Prolene on a Keith needle was passed.  The colon was divided and passed off with the open end being proximal.  EEA sizers were then introduced and a 29 mm EEA selected.  "Belt loops" consisting of 3-0 silk were placed around the pursestring suture line.  The anvil was placed and the pursestring tied.  A small amount of fat was cleared from the planned anastomosis and no diverticula were apparent within this.  This was placed back into the abdomen and a cap placed over the wound protector port site.  Pneumoperitoneum was reestablished.  I then went below to  pass the stapler.  My partner remained above.  EEA sizers were cautiously passed trans-anally under direct visualization.  The stapler was passed and the spike deployed just anterior to the staple line.  The components were then mated.  Orientation was confirmed such that there is no twisting of the colon nor small bowel underneath the mesenteric defect. Care was taken to ensure no other structures were incorporated within this either.  The stapler was then closed, held, and fired. This was then removed. The donuts were inspected and noted to be complete. Distal donut was passed off as specimen.  The colon proximal to the anastomosis was then gently occluded. The pelvis was filled with sterile irrigation. Under direct visualization, I passed a flexible sigmoidoscope.  The anastomosis was under water.  With good distention of the anastomosis there was no air leak. The anastomosis pink in appearance.  This is located at 13 cm from the anal verge by flexible sigmoidoscopy.  It is hemostatic.  Additionally, looking from above, there is no tension on the colon or mesentery.  Sigmoidoscope was withdrawn.  Irrigation was evacuated from  the pelvis.  The abdomen and pelvis are surveyed and noted to be completely hemostatic without any apparent injury.  Under direct visualization, all trochars are removed.  The [place] wound protector was removed.  Gowns/gloves are changed and a fresh set of clean instruments utilized. Additional sterile drapes were placed around the field.   Sponge, needle, and instrument counts were reported correct.  The Pfannenstiel peritoneum was closed with a running 2-0 Vicryl suture.  The rectus fascia was then closed using 2 running #1 PDS sutures.  The fascia was then palpated and noted to be completely closed.  Additional anesthetic was infiltrated at the Pfannenstiel site.  Sponge, needle, and instrument counts were then again reported correct. 4-0 Monocryl subcuticular suture was used to close  the skin of all incision sites.  Dermabond was placed over all incisions.  A honeycomb dressing placed over the Pfannenstiel as well.   She was taken out of lithotomy, awakened from anesthesia, extubated, and transferred to a stretcher for transport to PACU in satisfactory condition having tolerated the procedure well.

## 2019-11-22 NOTE — Discharge Instructions (Signed)
POST OP INSTRUCTIONS AFTER COLON SURGERY  1. DIET: Be sure to include lots of fluids daily to stay hydrated - 64oz of water per day (8, 8 oz glasses).  Avoid fast food or heavy meals for the first couple of weeks as your are more likely to get nauseated. Avoid raw/uncooked fruits or vegetables for the first 4 weeks (its ok to have these if they are blended into smoothie form). If you have fruits/vegetables, make sure they are cooked until soft enough to mash on the roof of your mouth and chew your food well. Otherwise, diet as tolerated.  2. Take your usually prescribed home medications unless otherwise directed.  3. PAIN CONTROL: a. Pain is best controlled by a usual combination of three different methods TOGETHER: i. Ice/Heat ii. Over the counter pain medication iii. Prescription pain medication b. Most patients will experience some swelling and bruising around the surgical site.  Ice packs or heating pads (30-60 minutes up to 6 times a day) will help. Some people prefer to use ice alone, heat alone, alternating between ice & heat.  Experiment to what works for you.  Swelling and bruising can take several weeks to resolve.   c. It is helpful to take an over-the-counter pain medication regularly for the first few weeks: i. Ibuprofen (Motrin/Advil) - 200mg tabs - take 3 tabs (600mg) every 6 hours as needed for pain (unless you have been directed previously to avoid NSAIDs/ibuprofen) ii. Acetaminophen (Tylenol) - you may take 650mg every 6 hours as needed. You can take this with motrin as they act differently on the body. If you are taking a narcotic pain medication that has acetaminophen in it, do not take over the counter tylenol at the same time. iii. NOTE: You may take both of these medications together - most patients  find it most helpful when alternating between the two (i.e. Ibuprofen at 6am, tylenol at 9am, ibuprofen at 12pm ...) d. A  prescription for pain medication should be given to you  upon discharge.  Take your pain medication as prescribed if your pain is not adequatly controlled with the over-the-counter pain reliefs mentioned above.  4. Avoid getting constipated.  Between the surgery and the pain medications, it is common to experience some constipation.  Increasing fluid intake and taking a fiber supplement (such as Metamucil, Citrucel, FiberCon, MiraLax, etc) 1-2 times a day regularly will usually help prevent this problem from occurring.  A mild laxative (prune juice, Milk of Magnesia, MiraLax, etc) should be taken according to package directions if there are no bowel movements after 48 hours.    5. Dressing: Your incisions are covered in Dermabond which is like sterile superglue for the skin. This will come off on it's own in a couple weeks. It is waterproof and you may bathe normally starting the day after your surgery in a shower. Avoid baths/pools/lakes/oceans until your wounds have fully healed.  6. ACTIVITIES as tolerated:   a. Avoid heavy lifting (>10lbs or 1 gallon of milk) for the next 6 weeks. b. You may resume regular daily activities as tolerated--such as daily self-care, walking, climbing stairs--gradually increasing activities as tolerated.  If you can walk 30 minutes without difficulty, it is safe to try more intense activity such as jogging, treadmill, bicycling, low-impact aerobics.  c. DO NOT PUSH THROUGH PAIN.  Let pain be your guide: If it hurts to do something, don't do it. d. You may drive when you are no longer taking prescription pain medication, you   can comfortably wear a seatbelt, and you can safely maneuver your car and apply brakes.  7. FOLLOW UP in our office a. Please call [facility] at [phone] to set up an appointment to see your surgeon in the office for a follow-up appointment approximately 2 weeks after your surgery. b. Make sure that you call for this appointment the day you arrive home to insure a convenient appointment time.  9. If you  have disability or family leave forms that need to be completed, you may have them completed by your primary care physician's office; for return to work instructions, please ask our office staff and they will be happy to assist you in obtaining this documentation   When to call us [phone]: 1. Poor pain control 2. Reactions / problems with new medications (rash/itching, etc)  3. Fever over 101.5 F (38.5 C) 4. Inability to urinate 5. Nausea/vomiting 6. Worsening swelling or bruising 7. Continued bleeding from incision. 8. Increased pain, redness, or drainage from the incision  The clinic staff is available to answer your questions during regular business hours (8:30am-5pm).  Please don't hesitate to call and ask to speak to one of our nurses for clinical concerns.   A surgeon from [facility] is always on call at the hospitals   If you have a medical emergency, go to the nearest emergency room or call 911.  [facility] [address], [address], [place], [place]  [address] MAIN: [phone] FAX: [phone] [email or web address] 

## 2019-11-22 NOTE — Transfer of Care (Signed)
Immediate Anesthesia Transfer of Care Note  Patient: Sonia Sandoval  Procedure(s) Performed: Procedure(s): XI ROBOT ASSISTED SIGMOIDECTOMY (N/A) FLEXIBLE SIGMOIDOSCOPY (N/A)  Patient Location: PACU  Anesthesia Type:General  Level of Consciousness:  sedated, patient cooperative and responds to stimulation  Airway & Oxygen Therapy:Patient Spontanous Breathing and Patient connected to face mask oxgen  Post-op Assessment:  Report given to PACU RN and Post -op Vital signs reviewed and stable  Post vital signs:  Reviewed and stable  Last Vitals:  Vitals:   11/22/19 1231  BP: (!) 139/79  Pulse: 74  Resp: 16  Temp: 36.7 C  SpO2: 324%    Complications: No apparent anesthesia complications

## 2019-11-23 LAB — BASIC METABOLIC PANEL
Anion gap: 11 (ref 5–15)
BUN: 8 mg/dL (ref 8–23)
CO2: 23 mmol/L (ref 22–32)
Calcium: 9 mg/dL (ref 8.9–10.3)
Chloride: 100 mmol/L (ref 98–111)
Creatinine, Ser: 0.63 mg/dL (ref 0.44–1.00)
GFR calc Af Amer: >60 mL/min (ref >60)
GFR calc non Af Amer: >60 mL/min (ref >60)
Glucose, Bld: 111 mg/dL — ABNORMAL HIGH (ref 70–99)
Potassium: 3.7 mmol/L (ref 3.5–5.1)
Sodium: 134 mmol/L — ABNORMAL LOW (ref 135–145)

## 2019-11-23 LAB — CBC
HCT: 38.2 % (ref 36.0–46.0)
Hemoglobin: 12.1 g/dL (ref 12.0–15.0)
MCH: 29.4 pg (ref 26.0–34.0)
MCHC: 31.7 g/dL (ref 30.0–36.0)
MCV: 92.9 fL (ref 80.0–100.0)
Platelets: 194 10*3/uL (ref 150–400)
RBC: 4.11 MIL/uL (ref 3.87–5.11)
RDW: 12.9 % (ref 11.5–15.5)
WBC: 7 10*3/uL (ref 4.0–10.5)
nRBC: 0 % (ref 0.0–0.2)

## 2019-11-23 NOTE — Progress Notes (Signed)
1 Day Post-Op   Subjective/Chief Complaint: No complaints. Had a bm overnight   Objective: Vital signs in last 24 hours: Temp:  [97.5 F (36.4 C)-98.9 F (37.2 C)] 98.5 F (36.9 C) (07/24 0551) Pulse Rate:  [67-86] 69 (07/24 0551) Resp:  [10-18] 18 (07/24 0551) BP: (101-139)/(63-105) 113/65 (07/24 0551) SpO2:  [85 %-100 %] 98 % (07/24 0551) Weight:  [64.3 kg-66.6 kg] 64.3 kg (07/24 0500) Last BM Date: (P) 11/23/19  Intake/Output from previous day: 07/23 0701 - 07/24 0700 In: 2418.9 [I.V.:2418.9] Out: 2375 [Urine:2350; Blood:25] Intake/Output this shift: No intake/output data recorded.  General appearance: alert and cooperative Resp: clear to auscultation bilaterally Cardio: regular rate and rhythm GI: soft, minimal tenderness. incisions look good. good bs  Lab Results:  Recent Labs    11/23/19 0634  WBC 7.0  HGB 12.1  HCT 38.2  PLT 194   BMET Recent Labs    11/23/19 0634  NA 134*  K 3.7  CL 100  CO2 23  GLUCOSE 111*  BUN 8  CREATININE 0.63  CALCIUM 9.0   PT/INR No results for input(s): LABPROT, INR in the last 72 hours. ABG No results for input(s): PHART, HCO3 in the last 72 hours.  Invalid input(s): PCO2, PO2  Studies/Results: No results found.  Anti-infectives: Anti-infectives (From admission, onward)   Start     Dose/Rate Route Frequency Ordered Stop   11/22/19 1230  cefoTEtan (CEFOTAN) 2 g in sodium chloride 0.9 % 100 mL IVPB  Status:  Discontinued        2 g 200 mL/hr over 30 Minutes Intravenous On call to O.R. 11/22/19 1221 11/22/19 1820      Assessment/Plan: s/p Procedure(s): XI ROBOT ASSISTED SIGMOIDECTOMY (N/A) FLEXIBLE SIGMOIDOSCOPY (N/A) Advance diet  Ambulate POD 1 D/c foley  LOS: 1 day    Autumn Messing III 11/23/2019

## 2019-11-24 LAB — BASIC METABOLIC PANEL
Anion gap: 8 (ref 5–15)
BUN: 7 mg/dL — ABNORMAL LOW (ref 8–23)
CO2: 26 mmol/L (ref 22–32)
Calcium: 8.8 mg/dL — ABNORMAL LOW (ref 8.9–10.3)
Chloride: 107 mmol/L (ref 98–111)
Creatinine, Ser: 0.61 mg/dL (ref 0.44–1.00)
GFR calc Af Amer: >60 mL/min (ref >60)
GFR calc non Af Amer: >60 mL/min (ref >60)
Glucose, Bld: 97 mg/dL (ref 70–99)
Potassium: 3.4 mmol/L — ABNORMAL LOW (ref 3.5–5.1)
Sodium: 141 mmol/L (ref 135–145)

## 2019-11-24 LAB — CBC
HCT: 38.4 % (ref 36.0–46.0)
Hemoglobin: 12.2 g/dL (ref 12.0–15.0)
MCH: 29.8 pg (ref 26.0–34.0)
MCHC: 31.8 g/dL (ref 30.0–36.0)
MCV: 93.7 fL (ref 80.0–100.0)
Platelets: 195 10*3/uL (ref 150–400)
RBC: 4.1 MIL/uL (ref 3.87–5.11)
RDW: 13.2 % (ref 11.5–15.5)
WBC: 3.6 10*3/uL — ABNORMAL LOW (ref 4.0–10.5)
nRBC: 0 % (ref 0.0–0.2)

## 2019-11-24 MED ORDER — TRAMADOL HCL 50 MG PO TABS: 50.0000 mg | ORAL_TABLET | Freq: Four times a day (QID) | ORAL | 0 refills | Status: AC | PRN

## 2019-11-24 NOTE — Progress Notes (Signed)
Assessment unchanged. Pt verbalized understanding of dc instructions through teach back. Discharged via foot per request accompanied by husband and daughter to front entrance.

## 2019-11-24 NOTE — Discharge Summary (Signed)
Physician Discharge Summary  Patient ID: LAMIRACLE CHAIDEZ MRN: [id number] DOB/AGE: May 06, 1950 69 y.o.  Admit date: 11/22/2019 Discharge date: 11/24/2019  Admission Diagnoses: Colon cancer  Discharge Diagnoses:  Active Problems:   S/P laparoscopic-assisted sigmoidectomy   Discharged Condition: good  Hospital Course: Patient was admitted after robotic sigmoidectomy.  Her diet was advanced as tolerated.  Her Foley was removed on postop day 1.  She began to have bowel function on postop day 1 as well.  By postop day 2 she was tolerating a diet and her pain was well controlled with Tylenol.  She was felt to be in stable condition for discharge to home.  Consults: None  Significant Diagnostic Studies: labs:  Cbc, bmet  Treatments: IV hydration, analgesia: acetaminophen and surgery: Robotic Sigmoidectomy  Discharge Exam: Blood pressure 128/81, pulse 66, temperature (!) 97.5 F (36.4 C), temperature source Oral, resp. rate 14, height 5\' 3"  (1.6 m), weight 64.3 kg, SpO2 100 %. General appearance: alert and cooperative GI: normal findings: soft, non-tender Incision/Wound: clean, dry, intact.  Mild amount of ecchymosis noted around incision  Disposition: Discharge disposition: 01-Home or Self Care        Allergies as of 11/24/2019      Reactions   Codeine Nausea And Vomiting, Other (See Comments)   headache      Medication List    TAKE these medications   anastrozole 1 MG tablet Commonly known as: ARIMIDEX Take 1 tablet (1 mg total) by mouth daily.   CALCIUM 1200+D3 PO   cetirizine 10 MG tablet Commonly known as: ZYRTEC Take 10 mg by mouth daily.   ferrous sulfate 325 (65 FE) MG tablet Take 1 tablet (325 mg total) by mouth daily with breakfast.   fexofenadine-pseudoephedrine 180-240 MG 24 hr tablet Commonly known as: ALLEGRA-D 24 Take 1 tablet by mouth daily.   HAIR/SKIN/NAILS PO Take 1 tablet by mouth daily.   pantoprazole 40 MG tablet Commonly known as:  PROTONIX Take 1 tablet (40 mg total) by mouth daily.   prenatal multivitamin Tabs tablet Take 1 tablet by mouth daily. Gummie   pyridOXINE 100 MG tablet Commonly known as: VITAMIN B-6 Take 100 mg by mouth daily.   traMADol 50 MG tablet Commonly known as: Ultram Take 1 tablet (50 mg total) by mouth every 6 (six) hours as needed for up to 5 days (severe postop pain not controlled with tylenol and ibuprofen first).   vitamin C 500 MG tablet Commonly known as: ASCORBIC ACID Take 500 mg by mouth daily.   Vitamin D3 25 MCG (1000 UT) Chew Chew 1,000 Units by mouth daily.   Vitamin E 180 MG Caps Take 180 mg by mouth daily.       Follow-up Information    [name], MD In 3 weeks.   Specialty: General Surgery Contact information: [place] [address] [phone]               Signed: Rosario Adie 9/73/5329, 8:27 AM

## 2019-11-25 ENCOUNTER — Encounter (HOSPITAL_COMMUNITY): Payer: Self-pay | Admitting: Surgery

## 2019-11-26 LAB — SURGICAL PATHOLOGY

## 2019-11-26 NOTE — Anesthesia Postprocedure Evaluation (Signed)
Anesthesia Post Note  Patient: Sonia Sandoval  Procedure(s) Performed: XI ROBOT ASSISTED SIGMOIDECTOMY (N/A Abdomen) FLEXIBLE SIGMOIDOSCOPY (N/A )     Patient location during evaluation: PACU Anesthesia Type: General Level of consciousness: awake and alert Pain management: pain level controlled Vital Signs Assessment: post-procedure vital signs reviewed and stable Respiratory status: spontaneous breathing, nonlabored ventilation, respiratory function stable and patient connected to nasal cannula oxygen Cardiovascular status: blood pressure returned to baseline and stable Postop Assessment: no apparent nausea or vomiting Anesthetic complications: no   No complications documented.  Last Vitals:  Vitals:   11/24/19 0620 11/24/19 0914  BP: 128/81 128/80  Pulse: 66 70  Resp: 14 14  Temp: (!) 36.4 C 36.9 C  SpO2: 100% 100%    Last Pain:  Vitals:   11/24/19 0914  TempSrc: Oral  PainSc:                  [place] S

## 2019-12-04 ENCOUNTER — Other Ambulatory Visit: Payer: Self-pay

## 2019-12-18 ENCOUNTER — Encounter: Payer: Self-pay | Admitting: Hematology

## 2019-12-18 ENCOUNTER — Inpatient Hospital Stay: Payer: BC Managed Care – PPO

## 2019-12-18 ENCOUNTER — Inpatient Hospital Stay: Payer: BC Managed Care – PPO | Attending: Hematology | Admitting: Hematology

## 2019-12-18 ENCOUNTER — Other Ambulatory Visit: Payer: Self-pay

## 2019-12-18 VITALS — BP 136/80 | HR 72 | Temp 98.0°F | Resp 18 | Ht 63.0 in | Wt 142.0 lb

## 2019-12-18 DIAGNOSIS — J984 Other disorders of lung: Secondary | ICD-10-CM | POA: Diagnosis not present

## 2019-12-18 DIAGNOSIS — I700 Atherosclerosis of aorta: Secondary | ICD-10-CM | POA: Insufficient documentation

## 2019-12-18 DIAGNOSIS — K319 Disease of stomach and duodenum, unspecified: Secondary | ICD-10-CM | POA: Diagnosis not present

## 2019-12-18 DIAGNOSIS — D500 Iron deficiency anemia secondary to blood loss (chronic): Secondary | ICD-10-CM | POA: Insufficient documentation

## 2019-12-18 DIAGNOSIS — C50511 Malignant neoplasm of lower-outer quadrant of right female breast: Secondary | ICD-10-CM

## 2019-12-18 DIAGNOSIS — Z7289 Other problems related to lifestyle: Secondary | ICD-10-CM | POA: Diagnosis not present

## 2019-12-18 DIAGNOSIS — K644 Residual hemorrhoidal skin tags: Secondary | ICD-10-CM | POA: Insufficient documentation

## 2019-12-18 DIAGNOSIS — K254 Chronic or unspecified gastric ulcer with hemorrhage: Secondary | ICD-10-CM | POA: Diagnosis not present

## 2019-12-18 DIAGNOSIS — Z885 Allergy status to narcotic agent status: Secondary | ICD-10-CM | POA: Insufficient documentation

## 2019-12-18 DIAGNOSIS — Z170 Estrogen receptor positive status [ER+]: Secondary | ICD-10-CM

## 2019-12-18 DIAGNOSIS — Z79899 Other long term (current) drug therapy: Secondary | ICD-10-CM | POA: Insufficient documentation

## 2019-12-18 DIAGNOSIS — K922 Gastrointestinal hemorrhage, unspecified: Secondary | ICD-10-CM | POA: Diagnosis not present

## 2019-12-18 DIAGNOSIS — C187 Malignant neoplasm of sigmoid colon: Secondary | ICD-10-CM | POA: Diagnosis not present

## 2019-12-18 DIAGNOSIS — R591 Generalized enlarged lymph nodes: Secondary | ICD-10-CM | POA: Insufficient documentation

## 2019-12-18 DIAGNOSIS — K648 Other hemorrhoids: Secondary | ICD-10-CM | POA: Insufficient documentation

## 2019-12-18 DIAGNOSIS — Z923 Personal history of irradiation: Secondary | ICD-10-CM | POA: Diagnosis not present

## 2019-12-18 DIAGNOSIS — Z9013 Acquired absence of bilateral breasts and nipples: Secondary | ICD-10-CM | POA: Insufficient documentation

## 2019-12-18 DIAGNOSIS — K449 Diaphragmatic hernia without obstruction or gangrene: Secondary | ICD-10-CM | POA: Diagnosis not present

## 2019-12-18 DIAGNOSIS — N200 Calculus of kidney: Secondary | ICD-10-CM | POA: Diagnosis not present

## 2019-12-18 DIAGNOSIS — R918 Other nonspecific abnormal finding of lung field: Secondary | ICD-10-CM | POA: Diagnosis not present

## 2019-12-18 LAB — CBC WITH DIFFERENTIAL/PLATELET
Abs Immature Granulocytes: 0 10*3/uL (ref 0.00–0.07)
Basophils Absolute: 0 10*3/uL (ref 0.0–0.1)
Basophils Relative: 1 %
Eosinophils Absolute: 0.1 10*3/uL (ref 0.0–0.5)
Eosinophils Relative: 2 %
HCT: 39.2 % (ref 36.0–46.0)
Hemoglobin: 13.1 g/dL (ref 12.0–15.0)
Immature Granulocytes: 0 %
Lymphocytes Relative: 26 %
Lymphs Abs: 0.8 10*3/uL (ref 0.7–4.0)
MCH: 29.9 pg (ref 26.0–34.0)
MCHC: 33.4 g/dL (ref 30.0–36.0)
MCV: 89.5 fL (ref 80.0–100.0)
Monocytes Absolute: 0.3 10*3/uL (ref 0.1–1.0)
Monocytes Relative: 10 %
Neutro Abs: 1.8 10*3/uL (ref 1.7–7.7)
Neutrophils Relative %: 61 %
Platelets: 193 10*3/uL (ref 150–400)
RBC: 4.38 MIL/uL (ref 3.87–5.11)
RDW: 13.3 % (ref 11.5–15.5)
WBC: 2.9 10*3/uL — ABNORMAL LOW (ref 4.0–10.5)
nRBC: 0 % (ref 0.0–0.2)

## 2019-12-18 LAB — COMPREHENSIVE METABOLIC PANEL
ALT: 19 U/L (ref 0–44)
AST: 24 U/L (ref 15–41)
Albumin: 4.1 g/dL (ref 3.5–5.0)
Alkaline Phosphatase: 61 U/L (ref 38–126)
Anion gap: 7 (ref 5–15)
BUN: 14 mg/dL (ref 8–23)
CO2: 25 mmol/L (ref 22–32)
Calcium: 9.7 mg/dL (ref 8.9–10.3)
Chloride: 106 mmol/L (ref 98–111)
Creatinine, Ser: 0.78 mg/dL (ref 0.44–1.00)
GFR calc Af Amer: >60 mL/min (ref >60)
GFR calc non Af Amer: >60 mL/min (ref >60)
Glucose, Bld: 98 mg/dL (ref 70–99)
Potassium: 4.2 mmol/L (ref 3.5–5.1)
Sodium: 138 mmol/L (ref 135–145)
Total Bilirubin: 0.4 mg/dL (ref 0.3–1.2)
Total Protein: 6.8 g/dL (ref 6.5–8.1)

## 2019-12-18 NOTE — Progress Notes (Signed)
Sonia Sandoval   [phone] [phone]) [phone]   Clinic Follow up Note   Patient Care Team: [name], [place] as PCP - General (Nurse Practitioner) [name], MD as Consulting Physician (General Surgery) Magrinat, [name], MD as Consulting Physician (Oncology) [name], MD as Consulting Physician (Radiation Oncology) [name], RN as Registered Nurse [name], MD as Referring Physician (Dermatology) [name], RN as Oncology Nurse Navigator [name], MD as Consulting Physician (Hematology) [name], MD as Consulting Physician (General Surgery) [name], MD as Consulting Physician (Hematology)  Date of Service:  12/18/2019  CHIEF COMPLAINT: F/u of colon cancer   SUMMARY OF ONCOLOGIC HISTORY: Oncology History Overview Note  Cancer Staging Cancer of sigmoid colon [facility]) Staging form: Colon and Rectum, AJCC 8th Edition - Pathologic stage from 11/22/2019: Stage IIA (pT3, pN0, cM0) - Signed by [name], MD on 12/17/2019  Malignant neoplasm of lower-outer quadrant of right breast of female, estrogen receptor positive ([place]) Staging form: Breast, AJCC 8th Edition - Clinical: Stage IIB (cT2, cN1, cM0, G3, ER+, PR+, HER2-) - Unsigned - Pathologic: Stage IIA (pT2, pN1(sn), cM0, G3, ER+, PR+, HER2-) - Unsigned    Malignant neoplasm of lower-outer quadrant of right breast of female, estrogen receptor positive ([place])  10/23/2017 Initial Diagnosis    right breast lower outer quadrant biopsy 10/23/2017 for a clinical T2 N1, stage IIB invasive ductal carcinoma, grade 3, estrogen and progesterone receptor positive, HER-2 not amplified, with an MIB-1 of 20%   11/07/2017 Miscellaneous   Mammaprint on was read as low risk, predicting no significant chemotherapy benefit with a 5-year distant disease-free survival in the 97-98% range with hormone therapy alone   12/05/2017 Surgery   status post bilateral mastectomies on  12/05/2017 showing             (a) on the left, no evidence of malignancy             (b) on the right, a pT2 pN1, stage IIB invasive ductal carcinoma, grade 3, with negative margins. By Dr Brantley Stage   01/15/2018 - 02/28/2018 Radiation Therapy   adjuvant radiation 01/15/2018 - 02/28/2018 with Dr Lisbeth Renshaw              Site/dose: The patient initially received a dose of 50.4 Gy in 28 fractions to the chest wall and supraclavicular region. This was delivered using a 3-D conformal, 4 field technique. The patient then received a boost to the mastectomy scar. This delivered an additional 10 Gy in 5 fractions using an en face electron field. The total dose was 60.4 Gy.    04/01/2018 -  Anti-estrogen oral therapy   started anastrozole              (a) bone density 03/07/2018 with a T score of -1.8   10/28/2019 Genetic Testing   Negative genetic testing.  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  The Common Hereditary Gene Panel offered by [facility] includes sequencing and/or deletion duplication testing of the following 48 genes: APC, ATM, AXIN2, BARD1, BMPR1A, BRCA1, BRCA2, BRIP1, CDH1, CDK4, CDKN2A (p14ARF), CDKN2A (p16INK4a), CHEK2, CTNNA1, DICER1, EPCAM (Deletion/duplication testing only), GREM1 (promoter region deletion/duplication testing only), KIT, MEN1, MLH1, MSH2, MSH3, MSH6, MUTYH, NBN, NF1, NHTL1, PALB2, PDGFRA, PMS2, POLD1, POLE, PTEN, RAD50, RAD51C, RAD51D, RNF43, SDHB, SDHC, SDHD, SMAD4, SMARCA4. STK11, TP53, TSC1, TSC2, and VHL.  The following genes were evaluated for sequence changes only: SDHA and HOXB13 c.251G>A variant only. The  report date is October 28, 2019.   Cancer of sigmoid colon ([place])  09/23/2019 Tumor Marker   Baseline CEA 1.4 on 09/23/19    09/23/2019 Procedure   Colonoscopy and Upper endoscopy with Dr Alessandra Bevels 09/23/19  Upper Endoscopy Impression - Z-line regular, 35 cm from the incisors. - Small hiatal hernia. - Non-bleeding gastric ulcer.  Biopsied. - Hematin (altered blood/coffee-ground-like material) in the gastric body. - Normal duodenal bulb, first portion of the duodenum and second portion of - Normal duodenal bulb, first portion of the duodenum and second portion of the duodenum.  Colonoscopy Impression - Preparation of the colon was poor. - Perianal skin tags found on perianal exam. - Likely malignant tumor in the recto-sigmoid colon. Biopsied. Tattooed. - Internal hemorrhoids.   09/23/2019 Initial Biopsy   FINAL MICROSCOPIC DIAGNOSIS: 09/23/19 A. STOMACH, ULCER, BIOPSY:  - Gastric antral mucosa with mild nonspecific reactive gastropathy  - Warthin Starry stain is negative for Helicobacter pylori   B. COLON, RECTOSIGMOID, MASS, BIOPSY:  - Adenocarcinoma, see comment  COMMENT:   B.  Immunohistochemical stains for MMR-related proteins are pending and  will be reported in an addendum. Dr. Saralyn Pilar reviewed the case and  concurs with the diagnosis. Dr. Alessandra Bevels was notified on 09/24/2019.     ADDENDUM:  Mismatch Repair Protein (IHC)  SUMMARY INTERPRETATION: NORMAL  There is preserved expression of the major MMR proteins. There is a very  low probability that microsatellite instability (MSI) is present.  However, certain clinically significant MMR protein mutations may result  in preservation of nuclear expression. It is recommended that the  preservation of protein expression be correlated with molecular based  MSI testing.   IHC EXPRESSION RESULTS  TEST           RESULT  MLH1:          Preserved nuclear expression  MSH2:          Preserved nuclear expression  MSH6:          Preserved nuclear expression  PMS2:          Preserved nuclear expression    09/23/2019 Imaging   CT CAP W Contrast 09/23/19  IMPRESSION: 1. There is eccentric wall thickening of the rectosigmoid colon, in keeping with mass identified and biopsied by colonoscopy.   2. There are prominent subcentimeter lymph nodes in the  sigmoid mesocolon, suspicious for nodal metastatic disease. There is no other lymphadenopathy in the abdomen or pelvis.   3. There are multiple small bilateral pulmonary nodules measuring up to 4 mm, nonspecific although very suspicious for pulmonary metastatic disease. Given history of breast malignancy, previous imaging of the chest would be very helpful to assess for stability. This report may be addended for comparison if prior imaging can be obtained.   4. Status post bilateral mastectomy with irregular subpleural opacity of the anterior right upper lobe, consistent with history of breast malignancy and radiation fibrosis.   5. There is adjacent nodularity of the right pulmonary apex, the largest discrete nodular component measuring up to 1.1 cm. This is more favored infectious or inflammatory although nonspecific and metastatic disease is not excluded. As as above, prior imaging would be very helpful to assess.   6. Small focus of nonspecific infectious or inflammatory airspace disease of the perihilar left upper lobe.   09/26/2019 Initial Diagnosis   Cancer of sigmoid colon ([place])   10/28/2019 PET scan   IMPRESSION: 1. Increased tracer uptake within the pelvis corresponding to wall thickening of the  rectosigmoid colon favored to represent primary colorectal carcinoma. No findings of FDG avid nodal metastasis or solid organ metastasis within the abdomen or pelvis. 2. Within the right lung apex there is a cluster of 3 subpleural nodular densities which exhibit varying degrees of mild FDG uptake with SUV max between 1.9 and 3.1. Morphologically the appearance is suggestive of benign postinflammatory pleuroparenchymal scarring. This would be an atypical pattern of metastatic disease, which is considered less favored, but not excluded. Attention in this area on serial follow-up imaging is advised. 3. Two small, less than 5 mm left lower lobe lung nodules are too small to  reliably characterize. 4.  Aortic Atherosclerosis (ICD10-I70.0). 5. Nonobstructing right renal calculus.     11/22/2019 Cancer Staging   Staging form: Colon and Rectum, AJCC 8th Edition - Pathologic stage from 11/22/2019: Stage IIA (pT3, pN0, cM0) - Signed by [name], MD on 12/17/2019   11/22/2019 Surgery   XI ROBOT ASSISTED SIGMOIDECTOMY and FLEXIBLE SIGMOIDOSCOPY by Dr white and Dr Marcello Moores   11/22/2019 Pathology Results   SURGICAL PATHOLOGY  CASE: [id number]  PATIENT: Sonia Sandoval  Surgical Pathology Report      Clinical History: colon cancer      FINAL MICROSCOPIC DIAGNOSIS:   A. SIGMOID COLON, RESECTION:  - Invasive moderately differentiated adenocarcinoma, 4.5 cm, involving  distal sigmoid colon  - Carcinoma focally invades into pericolonic soft tissue  - Resection margins are negative for carcinoma  - Negative for lymphovascular or perineural invasion  - Eighteen benign lymph nodes (0/18)  - See oncology table   B. FINAL DISTAL MARGIN, EXCISION:  - Colonic donut, negative for carcinoma       CURRENT THERAPY:  Surveillance   INTERVAL HISTORY:  Sonia Sandoval is here for a follow up. She presents to the clinic alone. She notes she has recovered well from surgery. She notes she is doing well. She has soft formed very dark stool. She is on oral iron. She notes although her insurance approved her 10/2019 PET they are not willing to pay it. She will f/u with them.  She plans to f/u with Dr Jana Hakim on 02/24/20.    REVIEW OF SYSTEMS:   Constitutional: Denies fevers, chills or abnormal weight loss Eyes: Denies blurriness of vision Ears, nose, mouth, throat, and face: Denies mucositis or sore throat Respiratory: Denies cough, dyspnea or wheezes Cardiovascular: Denies palpitation, chest discomfort or lower extremity swelling Gastrointestinal:  Denies nausea, heartburn (+) Soft stool.  Skin: Denies abnormal skin rashes Lymphatics: Denies new lymphadenopathy or easy  bruising Neurological:Denies numbness, tingling or new weaknesses Behavioral/Psych: Mood is stable, no new changes  All other systems were reviewed with the patient and are negative.  MEDICAL HISTORY:  Past Medical History:  Diagnosis Date  . Allergies 1995   Per patient report 11/14/18  . Breast cancer ([place])   . Breast cancer, right breast ([place]) 09/2017   S/P mastectomy 12/05/2017  . Family history of breast cancer   . Headache 1968   per patient report, started as a teenager  . 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  . Osteopenia 03/2018   Seen on DEXA Scan  . PONV (postoperative nausea and vomiting)     SURGICAL HISTORY: Past Surgical History:  Procedure Laterality Date  . BACK SURGERY  2003   Patient will occassional back pain since 2003  . BIOPSY  09/23/2019  Procedure: BIOPSY;  Surgeon: Otis Brace, MD;  Location: [facility];  Service: Gastroenterology;;  EGD and COLON  . BREAST BIOPSY Right 09/2017  . [place]; 1992  . COLONOSCOPY WITH PROPOFOL N/A 09/23/2019   Procedure: COLONOSCOPY WITH PROPOFOL and EGD;  Surgeon: Otis Brace, MD;  Location: [facility];  Service: Gastroenterology;  Laterality: N/A;  . DILATION AND CURETTAGE OF UTERUS    . ESOPHAGOGASTRODUODENOSCOPY N/A 09/23/2019   Procedure: ESOPHAGOGASTRODUODENOSCOPY (EGD);  Surgeon: Otis Brace, MD;  Location: [facility];  Service: Gastroenterology;  Laterality: N/A;  . FLEXIBLE SIGMOIDOSCOPY N/A 11/22/2019   Procedure: FLEXIBLE SIGMOIDOSCOPY;  Surgeon: [name], MD;  Location: [facility];  Service: General;  Laterality: N/A;  . LUMBAR [place] SURGERY  2003  . MASTECTOMY Left 12/05/2017   PROPHYLACTIC MASTECTOMY  . MASTECTOMY COMPLETE / SIMPLE W/ SENTINEL NODE BIOPSY Right 12/05/2017   WITH RADIOACTIVE SEED TARGETED RIGHT AXIILARY LYMPH NODE EXCISION AND RIGHT  SENTINEL LYMPH NODE BIOPSY,   . MASTECTOMY WITH RADIOACTIVE SEED GUIDED EXCISION AND AXILLARY SENTINEL LYMPH NODE BIOPSY Bilateral 12/05/2017   Procedure: RIGHT SIMPLE MASTECTOMY WITH RADIOACTIVE SEED TARGETED RIGHT AXIILARY LYMPH NODE EXCISION AND RIGHT SENTINEL LYMPH NODE BIOPSY, LEFT PROPHYLACTIC MASTECTOMY;  Surgeon: [name], MD;  Location: [facility];  Service: General;  Laterality: Bilateral;  . SUBMUCOSAL TATTOO INJECTION  09/23/2019   Procedure: SUBMUCOSAL TATTOO INJECTION;  Surgeon: Otis Brace, MD;  Location: [facility];  Service: Gastroenterology;;    I have reviewed the social history and family history with the patient and they are unchanged from previous note.  ALLERGIES:  is allergic to codeine.  MEDICATIONS:  Current Outpatient Medications  Medication Sig Dispense Refill  . calcium-vitamin D (OSCAL WITH D) 500-200 MG-UNIT tablet Take 1 tablet by mouth daily.    . Pseudoephedrine-Naproxen Na (ALEVE-D SINUS & HEADACHE PO) Take by mouth as needed.    Marland Kitchen anastrozole (ARIMIDEX) 1 MG tablet Take 1 tablet (1 mg total) by mouth daily. 90 tablet 4  . ferrous sulfate 325 (65 FE) MG tablet Take 1 tablet (325 mg total) by mouth daily with breakfast. 90 tablet 0  . pantoprazole (PROTONIX) 40 MG tablet Take 1 tablet (40 mg total) by mouth daily. 90 tablet 0  . Prenatal Vit-Fe Fumarate-FA (PRENATAL MULTIVITAMIN) TABS tablet Take 1 tablet by mouth daily. Gummie    . Vitamin E 180 MG CAPS Take 180 mg by mouth daily.      No current facility-administered medications for this visit.    PHYSICAL EXAMINATION: ECOG PERFORMANCE STATUS: 1 - Symptomatic but completely ambulatory  Vitals:   12/18/19 1123  BP: 136/80  Pulse: 72  Resp: 18  Temp: 98 F (36.7 C)  SpO2: 100%   Filed Weights   12/18/19 1123  Weight: 142 lb (64.4 kg)    Due to COVID19 we will limit examination to appearance. Patient had no complaints.  GENERAL:alert, no distress and comfortable SKIN: skin color  normal, no rashes or significant lesions EYES: normal, Conjunctiva are pink and non-injected, sclera clear  NEURO: alert & oriented x 3 with fluent speech   LABORATORY DATA:  I have reviewed the data as listed CBC Latest Ref Rng & Units 12/18/2019 11/24/2019 11/23/2019  WBC 4.0 - 10.5 K/uL 2.9(L) 3.6(L) 7.0  Hemoglobin 12.0 - 15.0 g/dL 13.1 12.2 12.1  Hematocrit 36 - 46 % 39.2 38.4 38.2  Platelets 150 - 400 K/uL 193 195 194     CMP Latest Ref Rng & Units 12/18/2019 11/24/2019 11/23/2019  Glucose 70 - 99 mg/dL 98 97 111(H)  BUN 8 - 23 mg/dL 14 7(L) 8  Creatinine 0.44 - 1.00 mg/dL 0.78 0.61 0.63  Sodium 135 - 145 mmol/L 138 141 134(L)  Potassium 3.5 - 5.1 mmol/L 4.2 3.4(L) 3.7  Chloride 98 - 111 mmol/L 106 107 100  CO2 22 - 32 mmol/L '25 26 23  ' Calcium 8.9 - 10.3 mg/dL 9.7 8.8(L) 9.0  Total Protein 6.5 - 8.1 g/dL 6.8 - -  Total Bilirubin 0.3 - 1.2 mg/dL 0.4 - -  Alkaline Phos 38 - 126 U/L 61 - -  AST 15 - 41 U/L 24 - -  ALT 0 - 44 U/L 19 - -      RADIOGRAPHIC STUDIES: I have personally reviewed the radiological images as listed and agreed with the findings in the report. No results found.   ASSESSMENT & PLAN:  Sonia Sandoval is a 69 y.o. female with   1. Colon Cancer, pT3N0M0, stage II -She was diagnosed in 08/2019. She is found to have malignant tumor in recto-sigmoid and biopsy confirmed adenocarcinoma.  -She proceeded with colon surgery with Dr Dema Severin and Dr Marcello Moores on 11/22/19. We discussed her surgical pathology which showed 4.5cm moderately differentiated adenocarcinoma was completely resected with negative margins, node negative.  No high risk features on the surgical pathology. -She overall had Stage II disease with about 20% risk of recurrence. Given she does not have high risk features, I do not recommend adjuvant chemotherapy.  -I discussed the [place] which is a pilot study to see if she has circulating tumor DNA. We repeat the test three time in the first 6  months after surgery.  The positive test result predicts high risk of recurrence. She will think about it, I gave her print out information. I discussed the first test need to be done in the next 3 weeks.  -Labs reviewed, CBC and CMP WNL except WBC 2.9. -I discussed the risk of cancer recurrence in the future. I discussed the surveillance plan, which is a physical exam and lab test (including CBC, CMP and CEA) every 3 months for the first 2 years, then every 6-12 months, colonoscopy in one year, and surveillance CT scan every 12 month for up to 3 year.  -F/u in 4 months  2. Lung nodules  -Her initial staging CT scan for colon cancer showed bilateral multiple small nodules measuring up to 4 mm, and heterogeneous airspace opacity of the right upper lobe, which we think is related to her previous breast radiation. -Repeat CT chest with contrast in 4 months  3. GI bleeding, Iron deficient anemia secondary to blood loss from #1 -She initially had mild GI bleeding in stool and coffee ground in stool since colonoscopy.   -She required blood transfusion on 09/25/10 and VI Venofer in 10/2019. She responded well.  -She is on oral iron currently.  -S/p colon surgery her anemia resolved. She is fine to stop oral iron.   4. H/o Right Breast Cancer, G3, T2N1, ER+/PR+/HER- -She was diagnosed in 09/2017. She was treated with B/l mastectomies by Dr Brantley Stage and adjuvant radiation by Dr Lisbeth Renshaw  -She is currently on AI with Anastrozole since 04/2018. Tolerating well.  -She is being followed by Dr Jana Hakim.   5. 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 I recommend she stop given she will be on chemotherapy and her overall  cancer treatment. She is willing to stop drinking.     PLAN:  -We discussed her surgical pathology findings, I do not recommend adjuvant chemotherapy  -She was think about  GuardantReveal test, and to call me back if she is interested.   -Fine to stop Oral iron  -F/u in 4 months with lab and CT Chest with contrast a few days before   No problem-specific Assessment & Plan notes found for this encounter.   Orders Placed This Encounter  Procedures  . CT Chest W Contrast    Standing Status:   Future    Standing Expiration Date:   12/17/2020    Order Specific Question:   If indicated for the ordered procedure, I authorize the administration of contrast media per Radiology protocol    Answer:   Yes    Order Specific Question:   Preferred imaging location?    Answer:   [facility]    Order Specific Question:   Radiology Contrast Protocol - do NOT remove file path    Answer:   [email or web address]   All questions were answered. The patient knows to call the clinic with any problems, questions or concerns. No barriers to learning was detected. The total time spent in the appointment was 30 minutes.     [name], MD 12/18/2019   I, Joslyn Devon, am acting as scribe for [name], MD.   I have reviewed the above documentation for accuracy and completeness, and I agree with the above.

## 2019-12-19 ENCOUNTER — Telehealth: Payer: Self-pay | Admitting: Hematology

## 2019-12-19 NOTE — Telephone Encounter (Signed)
Scheduled per 8/18 los. Pt is aware of appt times and dates.

## 2020-02-23 NOTE — Progress Notes (Signed)
[place]  [phone]) [phone] Fax:([phone]) [phone]    ID: TYWANA Sandoval DOB: Sep 14, 1950  MR#: [id number]  EML#:[id number]  Patient Care Team: [name], [place] as PCP - General (Nurse Practitioner) [name], MD as Consulting Physician (General Surgery) Shay Jhaveri, [name], MD as Consulting Physician (Oncology) [name], MD as Consulting Physician (Radiation Oncology) [name], RN as Registered Nurse [name], MD as Referring Physician (Dermatology) [name], RN as Oncology Nurse Navigator [name], MD as Consulting Physician (Hematology) [name], MD as Consulting Physician (General Surgery) [name], MD as Consulting Physician (Hematology) OTHER MD:   CHIEF COMPLAINT: 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  She continues on anastrozole.  For a long time she had absolutely no side effects or symptoms related to this but more recently she has developed occasional hot flashes followed by a little bit of a cold spell.  This can happen once or at most twice a day.  It does not happen every day.  It does not wake her up at night.  Sonia Sandoval Sandoval's last bone density screening on 03/07/2018, showed a T-score of -1.8, which is considered osteopenic.  Since her last visit, she was diagnosed with colon cancer in 08/2019. She underwent colonoscopy on 09/23/2019 under Dr. Alessandra Bevels for several episodes of mild GI bleeding. During the procedure, a 6 cm ulcerated mass was found in the recto-sigmoid colon. Biopsy was obtained from the mass, and pathology ([id number]) revealed adenocarcinoma, no MR protein mutations.    She proceeded to staging CT chest, abdomen, pelvis that day showing: eccentric wall thickening of rectosigmoid colon, in keeping with mass identified and  biopsied by colonoscopy; prominent sub-centimeter lymph nodes in sigmoid mesocolon, no other lymphadenopathy in abdomen or pelvis; nonspecific multiple small bilateral pulmonary nodules, measuring up to 4 mm; nonspecific nodularity of right pulmonary apex, largest discrete component measuring up to 1.1 cm, more favored infectious or inflammatory; small focus of nonspecific infectious or inflammatory airspace disease of perihilar left upper lobe.  She was referred to Dr. Burr Medico on 09/26/2019. They discussed surgical resection and the possible need for adjuvant chemotherapy. PET scan and genetic testing were also recommended. Genetic counseling was performed on 10/16/2019 and showed negative results, with the exception of a variant of uncertain significance in MSH3.  PET scan was performed on 10/28/2019 showing: increased tracer uptake within pelvis corresponding to wall thickening of rectosigmoid colon, no findings of FDG-avid nodal metastasis or solid organ metastasis within abdomen or pelvis; cluster of 3 subpleural nodular densities exhibiting varying degrees of mild FGD uptake within right lung apex, with appearance suggestive of benign scarring.  She proceeded to surgical resection on 11/22/2019 under Dr. Dema Severin. Pathology from the procedure ([id number]) showed: invasive moderately differentiated adenocarcinoma, 4.5 cm, involving distal sigmoid colon; carcinoma focally invades into pericolonic soft tissue; resection margins negative.  The patient did not need a colostomy.  All 18 biopsied lymph nodes were benign (0/18).  She met with Dr. Burr Medico for follow up on 12/18/2019. Given the lack of high risk features of pathology, adjuvant therapy was not recommended. Sonia Sandoval Sandoval is now under observation and specifically the lung nodules will be followed with serial scans..  She was offered the guardiantReveal test and is considering that.     REVIEW OF SYSTEMS: Sonia Sandoval Sandoval feels "terrific".  She has had the Pfizer vaccine x2  without complications.  She walks between 10 and 11,000 steps most days.  She is having "better" bowel movements than she was before the surgery.  She is to be fairly constipated and that is not an issue now.  She has more gas.  She will have 1-3 bowel movements 1 day and then no bowel movement for a day or 2.  She has had no blood in her stools, no melenic stools, no pain with defecation, and no incontinence.  Her weight is stable.  A detailed review of systems today was otherwise noncontributory    HISTORY OF CURRENT ILLNESS: From the original intake note:  "Sonia Sandoval Sandoval" felt that her right breast was noticeably smaller than the left. She brought this to her nurse practitioner's attention.  She had not had mammography in several years.  Sonia Sandoval Sandoval was set up for bilateral diagnostic mammography with tomography and right breast ultrasonography at [facility] on 10/19/2017 showing: Breast density category B.  Highly suspicious mass in the right lower outer quadrant measuring 3.3 x 2.8 x 3.2 cm,  2 cm from the nipple with associated skin retraction. The right axilla showed a 9 mm lymph node with cortical thickening. There was no evidence of malignancy in the left breast.   Accordingly on 10/23/2017 she proceeded to biopsy of the right breast area in question. The pathology from this procedure showed ([id number]): Invasive ductal carcinoma grade III. The right axillary lymph node was also positive.  There was no lymph node tissue identified. Prognostic indicators significant for: estrogen receptor, 95% positive and progesterone receptor, 45% positive, both with strong staining intensity. Proliferation marker Ki67 at 20%. HER2  not amplified with ratios HER2/CEP17 signals 1.25 and average HER2 copies per cell 3.00  The patient's subsequent history is as detailed below.   PAST MEDICAL HISTORY: Past Medical History:  Diagnosis Date  . Allergies 1995   Per patient report 11/14/18  . Breast cancer ([place])   . Breast  cancer, right breast ([place]) 09/2017   S/P mastectomy 12/05/2017  . Family history of breast cancer   . Headache 1968   per patient report, started as a teenager  . 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  . Osteopenia 03/2018   Seen on DEXA Scan  . PONV (postoperative nausea and vomiting)    She denies a history of cataracts, glaucoma, emphysema, asthma, HTN, heart murmur or palpitations, GERD, stomach ulcers, issues with bowels or bladder.     PAST SURGICAL HISTORY: Past Surgical History:  Procedure Laterality Date  . BACK SURGERY  2003   Patient will occassional back pain since 2003  . BIOPSY  09/23/2019   Procedure: BIOPSY;  Surgeon: Otis Brace, MD;  Location: [facility];  Service: Gastroenterology;;  EGD and COLON  . BREAST BIOPSY Right 09/2017  . [place]; 1992  . COLONOSCOPY WITH PROPOFOL N/A 09/23/2019   Procedure: COLONOSCOPY WITH PROPOFOL and EGD;  Surgeon: Otis Brace, MD;  Location: [facility];  Service: Gastroenterology;  Laterality: N/A;  . DILATION AND CURETTAGE OF UTERUS    . ESOPHAGOGASTRODUODENOSCOPY N/A 09/23/2019   Procedure: ESOPHAGOGASTRODUODENOSCOPY (EGD);  Surgeon: Otis Brace, MD;  Location: [facility];  Service: Gastroenterology;  Laterality: N/A;  . FLEXIBLE SIGMOIDOSCOPY N/A 11/22/2019   Procedure: FLEXIBLE SIGMOIDOSCOPY;  Surgeon: [name], MD;  Location: [facility];  Service: General;  Laterality: N/A;  . LUMBAR [place] SURGERY  2003  .  MASTECTOMY Left 12/05/2017   PROPHYLACTIC MASTECTOMY  . MASTECTOMY COMPLETE / SIMPLE W/ SENTINEL NODE BIOPSY Right 12/05/2017   WITH RADIOACTIVE SEED TARGETED RIGHT AXIILARY LYMPH NODE EXCISION AND RIGHT SENTINEL LYMPH NODE BIOPSY,   . MASTECTOMY WITH RADIOACTIVE SEED GUIDED EXCISION AND AXILLARY SENTINEL LYMPH NODE BIOPSY Bilateral 12/05/2017   Procedure: RIGHT SIMPLE  MASTECTOMY WITH RADIOACTIVE SEED TARGETED RIGHT AXIILARY LYMPH NODE EXCISION AND RIGHT SENTINEL LYMPH NODE BIOPSY, LEFT PROPHYLACTIC MASTECTOMY;  Surgeon: [name], MD;  Location: [facility];  Service: General;  Laterality: Bilateral;  . SUBMUCOSAL TATTOO INJECTION  09/23/2019   Procedure: SUBMUCOSAL TATTOO INJECTION;  Surgeon: Otis Brace, MD;  Location: [facility];  Service: Gastroenterology;;   Ruptured vertebral disk surgery in 2003 under Dr. Ellene Route   FAMILY HISTORY: Family History  Problem Relation Age of Onset  . Dementia Mother   . Lung cancer Father   . Cancer Maternal Aunt 60       breast cancer   . Cancer Cousin        maternal first cousin with breast cancer in her 78s  . Heart disease Maternal Grandfather     The patient's father died at age 19 due to lung cancer (heavy smoker). The patient's mother died at age 35 due to MI and Alzheimer's. The patient had no brothers or sisters. The patient's mother was 1/10 siblings, with a few other siblings having a history of Alzheimer's. There was a maternal aunt who may have had ovarian cancer, and the patient is going to verify this with family.    GYNECOLOGIC HISTORY:  No LMP recorded. Patient is postmenopausal. Menarche: 69 years old Age at first live birth: 69 years old She is GXP4.  LMP: about 25 years ago ( early 72's). She used hormone replacement for about 3-5 years. She also used oral contraception for 1 year with no complications.    SOCIAL HISTORY:  Dustee is retired from working at [employer]. Her husband of 78 years, [name], is a Writer. He also used to be a paramedic. The patient and her husband share 1 daughter, Cloyde Reams age 66 who lives in [place], [place] as a Writer. The patient has 3 sons from a previous marriage. The oldest, A.C. Penn Marcello Moores age 44 is disabled with epilepsy. The second son B. Desmond Lope age 45, lives in [place], [place] in Press photographer. The third son, H.  Vevelyn Royals age 13, lives in [place], [place] in sales.   ADVANCED DIRECTIVES: The patient's husband, [name] is her HCPOA   HEALTH MAINTENANCE: Social History   Tobacco Use  . Smoking status: Never Smoker  . Smokeless tobacco: Never Used  Vaping Use  . Vaping Use: Never used  Substance Use Topics  . Alcohol use: Yes    Alcohol/week: 14.0 standard drinks    Types: 14 Glasses of wine per week  . Drug use: Never     Colonoscopy: 2013 in [place]  PAP: 2014  Bone density: 2014/ osteopenia   Allergies  Allergen Reactions  . Codeine Nausea And Vomiting and Other (See Comments)    headache    Current Outpatient Medications  Medication Sig Dispense Refill  . anastrozole (ARIMIDEX) 1 MG tablet Take 1 tablet (1 mg total) by mouth daily. 90 tablet 4  . calcium-vitamin D (OSCAL WITH D) 500-200 MG-UNIT tablet Take 1 tablet by mouth daily.    . ferrous sulfate 325 (65 FE) MG tablet Take 1 tablet (325 mg total) by mouth daily  with breakfast. 90 tablet 0  . pantoprazole (PROTONIX) 40 MG tablet Take 1 tablet (40 mg total) by mouth daily. 90 tablet 0  . Prenatal Vit-Fe Fumarate-FA (PRENATAL MULTIVITAMIN) TABS tablet Take 1 tablet by mouth daily. Gummie    . Pseudoephedrine-Naproxen Na (ALEVE-D SINUS & HEADACHE PO) Take by mouth as needed.    . Vitamin E 180 MG CAPS Take 180 mg by mouth daily.      No current facility-administered medications for this visit.    OBJECTIVE: White woman who appears younger than stated age  33:   02/24/20 1151  BP: 121/77  Pulse: 77  Resp: 18  Temp: 97.8 F (36.6 C)  SpO2: 100%     Body mass index is 24.78 kg/m.   Wt Readings from Last 3 Encounters:  02/24/20 139 lb 14.4 oz (63.5 kg)  12/18/19 142 lb (64.4 kg)  11/23/19 141 lb 12.1 oz (64.3 kg)      ECOG FS:1 - Symptomatic but completely ambulatory  Sclerae unicteric, EOMs intact Wearing a mask No cervical or supraclavicular adenopathy Lungs no rales or rhonchi Heart regular rate  and rhythm Abd soft, nontender, positive bowel sounds MSK no focal spinal tenderness, no upper extremity lymphedema Neuro: nonfocal, well oriented, appropriate affect Breasts: That is post bilateral mastectomies, there is no evidence of local recurrence.  Both axillae are benign. Skin: did not repeat skin exam today   LAB RESULTS:  CMP     Component Value Date/Time   NA 138 12/18/2019 1026   K 4.2 12/18/2019 1026   CL 106 12/18/2019 1026   CO2 25 12/18/2019 1026   GLUCOSE 98 12/18/2019 1026   BUN 14 12/18/2019 1026   CREATININE 0.78 12/18/2019 1026   CREATININE 0.77 10/21/2019 0844   CALCIUM 9.7 12/18/2019 1026   PROT 6.8 12/18/2019 1026   ALBUMIN 4.1 12/18/2019 1026   AST 24 12/18/2019 1026   AST 25 10/21/2019 0844   ALT 19 12/18/2019 1026   ALT 28 10/21/2019 0844   ALKPHOS 61 12/18/2019 1026   BILITOT 0.4 12/18/2019 1026   BILITOT 0.4 10/21/2019 0844   GFRNONAA >60 12/18/2019 1026   GFRNONAA >60 10/21/2019 0844   GFRAA >60 12/18/2019 1026   GFRAA >60 10/21/2019 0844   Lab Results  Component Value Date   WBC 4.6 02/24/2020   NEUTROABS 3.3 02/24/2020   HGB 12.4 02/24/2020   HCT 37.4 02/24/2020   MCV 92.8 02/24/2020   PLT 276 02/24/2020   No results found for: LABCA2  No components found for: ERXVQM086  No results for input(s): INR in the last 168 hours.  No results found for: LABCA2  No results found for: CAN199  No results found for: PYP950  No results found for: DTO671  No results found for: CA2729  No components found for: HGQUANT  Lab Results  Component Value Date   CEA1 1.4 09/23/2019   /  CEA  Date Value Ref Range Status  09/23/2019 1.4 0.0 - 4.7 ng/mL Final    Comment:    (NOTE)                             Nonsmokers          <3.9                             Smokers             <  5.6 Roche [employer] Electrochemiluminescence Immunoassay (ECLIA) Values obtained with different assay methods or kits cannot be used interchangeably.   Results cannot be interpreted as absolute evidence of the presence or absence of malignant disease. Performed At: [facility] [place], [place] [id number] Rush Farmer MD BL:[id number]      No results found for: AFPTUMOR  No results found for: Central   No results found for: TOTALPROTELP, ALBUMINELP, A1GS, A2GS, BETS, BETA2SER, GAMS, MSPIKE, SPEI (this displays SPEP labs)  No results found for: KPAFRELGTCHN, LAMBDASER, KAPLAMBRATIO (kappa/lambda light chains)  No results found for: HGBA, HGBA2QUANT, HGBFQUANT, HGBSQUAN (Hemoglobinopathy evaluation)   Lab Results  Component Value Date   LDH 181 02/21/2019    Lab Results  Component Value Date   IRON 64 09/21/2019   TIBC 395 09/21/2019   IRONPCTSAT 16 09/21/2019   (Iron and TIBC)  Lab Results  Component Value Date   FERRITIN 12 09/21/2019    Urinalysis    Component Value Date/Time   COLORURINE YELLOW 09/20/2019 1945   APPEARANCEUR CLEAR 09/20/2019 1945   LABSPEC <1.005 (L) 09/20/2019 1945   PHURINE 5.5 09/20/2019 1945   GLUCOSEU NEGATIVE 09/20/2019 1945   HGBUR NEGATIVE 09/20/2019 1945   BILIRUBINUR NEGATIVE 09/20/2019 1945   KETONESUR NEGATIVE 09/20/2019 1945   PROTEINUR NEGATIVE 09/20/2019 1945   NITRITE NEGATIVE 09/20/2019 1945   LEUKOCYTESUR SMALL (A) 09/20/2019 1945    STUDIES: No results found.   ELIGIBLE FOR AVAILABLE RESEARCH PROTOCOL: no   ASSESSMENT: 69 y.o. [place], [place] woman that is post right breast lower outer quadrant biopsy 10/23/2017 for a clinical T2 N1, stage IIB invasive ductal carcinoma, grade 3, estrogen and progesterone receptor positive, HER-2 not amplified, with an MIB-1 of 20%  (1) Mammaprint on 11/07/2017 was read as low risk, predicting no significant chemotherapy benefit with a 5-year distant disease-free survival in the 97-98% range with hormone therapy alone  (2) status post bilateral mastectomies on 12/05/2017 showing  (a) on the left, no  evidence of malignancy  (b) on the right, a pT2 pN1, stage IIB invasive ductal carcinoma, grade 3, with negative margins.  (3) adjuvant radiation 01/15/2018 - 02/28/2018  Site/dose: The patient initially received a dose of 50.4 Gy in 28 fractions to the chest wall and supraclavicular region. This was delivered using a 3-D conformal, 4 field technique. The patient then received a boost to the mastectomy scar. This delivered an additional 10 Gy in 5 fractions using an en face electron field. The total dose was 60.4 Gy.   (4) started anastrozole 04/01/2018  (a) bone density 03/07/2018 with a T score of -1.8  (5) colon cancer: Status post sigmoid resection 11/22/2019 for a 4.5 cm moderately differentiated colon carcinoma involving pericolonic soft tissue, with negative margins, and 0 of 18 regional lymph nodes involved: pT3 pN0  (a) PET scan 10/28/2019 showed in addition to the colon lesion, nodular densities in the right lung apex and small left lower lobe lung nodules requiring follow-up  (b) CEA not informative (1.4 on 09/23/2019.  PLAN: Sonia Sandoval Sandoval is now a little over 2 years out from definitive surgery for her breast cancer with no evidence of disease recurrence.  This is very favorable.  She is tolerating anastrozole well, and the plan is to continue that a minimum of 5 years.  She is due for repeat bone density this year and I have entered that order.  Will be performed at [facility] in November.  She already has intensive follow-up through Dr. Burr Medico and  Dr. Dema Severin.  I will see her again in 1 year.  She knows to call for any other issue that may develop before the next visit  Total encounter time 25 minutes.*   Silver Achey, [name], MD  02/24/20 12:07 PM Medical Oncology and Hematology [facility] [place], [place] [address] Tel. [phone]    Fax. [phone]   I, Wilburn Mylar, am acting as scribe for Dr. [name]. Lalena Salas.  I, Lurline Del MD, have  reviewed the above documentation for accuracy and completeness, and I agree with the above.   *Total Encounter Time as defined by the Centers for Medicare and Medicaid Services includes, in addition to the face-to-face time of a patient visit (documented in the note above) non-face-to-face time: obtaining and reviewing outside history, ordering and reviewing medications, tests or procedures, care coordination (communications with other health care professionals or caregivers) and documentation in the medical record.

## 2020-02-24 ENCOUNTER — Inpatient Hospital Stay: Payer: BC Managed Care – PPO

## 2020-02-24 ENCOUNTER — Other Ambulatory Visit: Payer: Self-pay

## 2020-02-24 ENCOUNTER — Inpatient Hospital Stay: Payer: BC Managed Care – PPO | Attending: Hematology | Admitting: Oncology

## 2020-02-24 VITALS — BP 121/77 | HR 77 | Temp 97.8°F | Resp 18 | Ht 63.0 in | Wt 139.9 lb

## 2020-02-24 DIAGNOSIS — Z79811 Long term (current) use of aromatase inhibitors: Secondary | ICD-10-CM | POA: Insufficient documentation

## 2020-02-24 DIAGNOSIS — Z7289 Other problems related to lifestyle: Secondary | ICD-10-CM | POA: Insufficient documentation

## 2020-02-24 DIAGNOSIS — M8580 Other specified disorders of bone density and structure, unspecified site: Secondary | ICD-10-CM | POA: Diagnosis not present

## 2020-02-24 DIAGNOSIS — J984 Other disorders of lung: Secondary | ICD-10-CM | POA: Diagnosis not present

## 2020-02-24 DIAGNOSIS — Z79899 Other long term (current) drug therapy: Secondary | ICD-10-CM | POA: Diagnosis not present

## 2020-02-24 DIAGNOSIS — Z9013 Acquired absence of bilateral breasts and nipples: Secondary | ICD-10-CM | POA: Diagnosis not present

## 2020-02-24 DIAGNOSIS — Z801 Family history of malignant neoplasm of trachea, bronchus and lung: Secondary | ICD-10-CM | POA: Diagnosis not present

## 2020-02-24 DIAGNOSIS — Z803 Family history of malignant neoplasm of breast: Secondary | ICD-10-CM | POA: Insufficient documentation

## 2020-02-24 DIAGNOSIS — C50511 Malignant neoplasm of lower-outer quadrant of right female breast: Secondary | ICD-10-CM | POA: Insufficient documentation

## 2020-02-24 DIAGNOSIS — C187 Malignant neoplasm of sigmoid colon: Secondary | ICD-10-CM | POA: Insufficient documentation

## 2020-02-24 DIAGNOSIS — R232 Flushing: Secondary | ICD-10-CM | POA: Diagnosis not present

## 2020-02-24 DIAGNOSIS — K5900 Constipation, unspecified: Secondary | ICD-10-CM | POA: Insufficient documentation

## 2020-02-24 DIAGNOSIS — Z885 Allergy status to narcotic agent status: Secondary | ICD-10-CM | POA: Diagnosis not present

## 2020-02-24 DIAGNOSIS — Z170 Estrogen receptor positive status [ER+]: Secondary | ICD-10-CM | POA: Insufficient documentation

## 2020-02-24 DIAGNOSIS — R918 Other nonspecific abnormal finding of lung field: Secondary | ICD-10-CM | POA: Diagnosis not present

## 2020-02-24 DIAGNOSIS — Z8249 Family history of ischemic heart disease and other diseases of the circulatory system: Secondary | ICD-10-CM | POA: Insufficient documentation

## 2020-02-24 LAB — CBC WITH DIFFERENTIAL/PLATELET
Abs Immature Granulocytes: 0.01 10*3/uL (ref 0.00–0.07)
Basophils Absolute: 0 10*3/uL (ref 0.0–0.1)
Basophils Relative: 0 %
Eosinophils Absolute: 0.1 10*3/uL (ref 0.0–0.5)
Eosinophils Relative: 1 %
HCT: 37.4 % (ref 36.0–46.0)
Hemoglobin: 12.4 g/dL (ref 12.0–15.0)
Immature Granulocytes: 0 %
Lymphocytes Relative: 19 %
Lymphs Abs: 0.9 10*3/uL (ref 0.7–4.0)
MCH: 30.8 pg (ref 26.0–34.0)
MCHC: 33.2 g/dL (ref 30.0–36.0)
MCV: 92.8 fL (ref 80.0–100.0)
Monocytes Absolute: 0.3 10*3/uL (ref 0.1–1.0)
Monocytes Relative: 7 %
Neutro Abs: 3.3 10*3/uL (ref 1.7–7.7)
Neutrophils Relative %: 73 %
Platelets: 276 10*3/uL (ref 150–400)
RBC: 4.03 MIL/uL (ref 3.87–5.11)
RDW: 12.5 % (ref 11.5–15.5)
WBC: 4.6 10*3/uL (ref 4.0–10.5)
nRBC: 0 % (ref 0.0–0.2)

## 2020-02-24 LAB — COMPREHENSIVE METABOLIC PANEL
ALT: 29 U/L (ref 0–44)
AST: 24 U/L (ref 15–41)
Albumin: 3.9 g/dL (ref 3.5–5.0)
Alkaline Phosphatase: 93 U/L (ref 38–126)
Anion gap: 6 (ref 5–15)
BUN: 20 mg/dL (ref 8–23)
CO2: 28 mmol/L (ref 22–32)
Calcium: 9.3 mg/dL (ref 8.9–10.3)
Chloride: 103 mmol/L (ref 98–111)
Creatinine, Ser: 0.78 mg/dL (ref 0.44–1.00)
GFR, Estimated: >60 mL/min (ref >60)
Glucose, Bld: 106 mg/dL — ABNORMAL HIGH (ref 70–99)
Potassium: 4.1 mmol/L (ref 3.5–5.1)
Sodium: 137 mmol/L (ref 135–145)
Total Bilirubin: 0.3 mg/dL (ref 0.3–1.2)
Total Protein: 6.8 g/dL (ref 6.5–8.1)

## 2020-03-19 ENCOUNTER — Other Ambulatory Visit: Payer: Self-pay | Admitting: Oncology

## 2020-04-15 ENCOUNTER — Other Ambulatory Visit: Payer: Self-pay

## 2020-04-15 ENCOUNTER — Ambulatory Visit (HOSPITAL_COMMUNITY)
Admission: RE | Admit: 2020-04-15 | Discharge: 2020-04-15 | Disposition: A | Discharge status: Home / Self Care | Payer: BC Managed Care – PPO | Source: Ambulatory Visit | Attending: Hematology | Admitting: Hematology

## 2020-04-15 ENCOUNTER — Encounter (HOSPITAL_COMMUNITY): Payer: Self-pay

## 2020-04-15 ENCOUNTER — Inpatient Hospital Stay: Payer: BC Managed Care – PPO | Attending: Hematology

## 2020-04-15 DIAGNOSIS — C19 Malignant neoplasm of rectosigmoid junction: Secondary | ICD-10-CM | POA: Insufficient documentation

## 2020-04-15 DIAGNOSIS — N200 Calculus of kidney: Secondary | ICD-10-CM | POA: Diagnosis not present

## 2020-04-15 DIAGNOSIS — I700 Atherosclerosis of aorta: Secondary | ICD-10-CM | POA: Insufficient documentation

## 2020-04-15 DIAGNOSIS — K259 Gastric ulcer, unspecified as acute or chronic, without hemorrhage or perforation: Secondary | ICD-10-CM | POA: Insufficient documentation

## 2020-04-15 DIAGNOSIS — R918 Other nonspecific abnormal finding of lung field: Secondary | ICD-10-CM

## 2020-04-15 DIAGNOSIS — K319 Disease of stomach and duodenum, unspecified: Secondary | ICD-10-CM | POA: Insufficient documentation

## 2020-04-15 DIAGNOSIS — C50511 Malignant neoplasm of lower-outer quadrant of right female breast: Secondary | ICD-10-CM | POA: Diagnosis not present

## 2020-04-15 DIAGNOSIS — Z170 Estrogen receptor positive status [ER+]: Secondary | ICD-10-CM | POA: Diagnosis not present

## 2020-04-15 DIAGNOSIS — Z9013 Acquired absence of bilateral breasts and nipples: Secondary | ICD-10-CM | POA: Diagnosis not present

## 2020-04-15 DIAGNOSIS — J984 Other disorders of lung: Secondary | ICD-10-CM | POA: Insufficient documentation

## 2020-04-15 DIAGNOSIS — K449 Diaphragmatic hernia without obstruction or gangrene: Secondary | ICD-10-CM | POA: Insufficient documentation

## 2020-04-15 DIAGNOSIS — R059 Cough, unspecified: Secondary | ICD-10-CM | POA: Insufficient documentation

## 2020-04-15 DIAGNOSIS — M47814 Spondylosis without myelopathy or radiculopathy, thoracic region: Secondary | ICD-10-CM | POA: Insufficient documentation

## 2020-04-15 DIAGNOSIS — Z923 Personal history of irradiation: Secondary | ICD-10-CM | POA: Diagnosis not present

## 2020-04-15 DIAGNOSIS — R079 Chest pain, unspecified: Secondary | ICD-10-CM | POA: Insufficient documentation

## 2020-04-15 DIAGNOSIS — C499 Malignant neoplasm of connective and soft tissue, unspecified: Secondary | ICD-10-CM | POA: Insufficient documentation

## 2020-04-15 DIAGNOSIS — K648 Other hemorrhoids: Secondary | ICD-10-CM | POA: Diagnosis not present

## 2020-04-15 DIAGNOSIS — Z7289 Other problems related to lifestyle: Secondary | ICD-10-CM | POA: Diagnosis not present

## 2020-04-15 DIAGNOSIS — Z885 Allergy status to narcotic agent status: Secondary | ICD-10-CM | POA: Insufficient documentation

## 2020-04-15 DIAGNOSIS — K644 Residual hemorrhoidal skin tags: Secondary | ICD-10-CM | POA: Diagnosis not present

## 2020-04-15 DIAGNOSIS — Z79899 Other long term (current) drug therapy: Secondary | ICD-10-CM | POA: Insufficient documentation

## 2020-04-15 LAB — CBC WITH DIFFERENTIAL/PLATELET
Abs Immature Granulocytes: 0.01 10*3/uL (ref 0.00–0.07)
Basophils Absolute: 0 10*3/uL (ref 0.0–0.1)
Basophils Relative: 0 %
Eosinophils Absolute: 0.1 10*3/uL (ref 0.0–0.5)
Eosinophils Relative: 1 %
HCT: 37.4 % (ref 36.0–46.0)
Hemoglobin: 12.4 g/dL (ref 12.0–15.0)
Immature Granulocytes: 0 %
Lymphocytes Relative: 18 %
Lymphs Abs: 0.9 10*3/uL (ref 0.7–4.0)
MCH: 30.4 pg (ref 26.0–34.0)
MCHC: 33.2 g/dL (ref 30.0–36.0)
MCV: 91.7 fL (ref 80.0–100.0)
Monocytes Absolute: 0.4 10*3/uL (ref 0.1–1.0)
Monocytes Relative: 7 %
Neutro Abs: 3.8 10*3/uL (ref 1.7–7.7)
Neutrophils Relative %: 74 %
Platelets: 229 10*3/uL (ref 150–400)
RBC: 4.08 MIL/uL (ref 3.87–5.11)
RDW: 11.9 % (ref 11.5–15.5)
WBC: 5.1 10*3/uL (ref 4.0–10.5)
nRBC: 0 % (ref 0.0–0.2)

## 2020-04-15 LAB — COMPREHENSIVE METABOLIC PANEL
ALT: 31 U/L (ref 0–44)
AST: 30 U/L (ref 15–41)
Albumin: 4.2 g/dL (ref 3.5–5.0)
Alkaline Phosphatase: 73 U/L (ref 38–126)
Anion gap: 6 (ref 5–15)
BUN: 20 mg/dL (ref 8–23)
CO2: 28 mmol/L (ref 22–32)
Calcium: 9.5 mg/dL (ref 8.9–10.3)
Chloride: 106 mmol/L (ref 98–111)
Creatinine, Ser: 0.85 mg/dL (ref 0.44–1.00)
GFR, Estimated: >60 mL/min (ref >60)
Glucose, Bld: 101 mg/dL — ABNORMAL HIGH (ref 70–99)
Potassium: 4 mmol/L (ref 3.5–5.1)
Sodium: 140 mmol/L (ref 135–145)
Total Bilirubin: 0.7 mg/dL (ref 0.3–1.2)
Total Protein: 7.2 g/dL (ref 6.5–8.1)

## 2020-04-15 MED ORDER — IOHEXOL 300 MG/ML  SOLN
75.0000 mL | Freq: Once | INTRAMUSCULAR | Status: AC | PRN
  Administered 2020-04-15: 14:00:00 75 mL via INTRAVENOUS

## 2020-04-15 MED ORDER — SODIUM CHLORIDE (PF) 0.9 % IJ SOLN
INTRAMUSCULAR | Status: AC
  Filled 2020-04-15: qty 50

## 2020-04-15 NOTE — Progress Notes (Signed)
Sonia Sandoval   [phone] Fax:([phone]) [phone]   Clinic Follow up Note   Patient Care Team: [name], [name] as PCP - General (Nurse Practitioner) [name], MD as Consulting Physician (General Surgery) Magrinat, Sonia Dad, MD as Consulting Physician (Oncology) [name], MD as Consulting Physician (Radiation Oncology) [name], RN as Registered Nurse [name], MD as Referring Physician (Dermatology) [name], RN as Oncology Nurse Navigator [name], MD as Consulting Physician (Hematology) [name], MD as Consulting Physician (General Surgery) [name], MD as Consulting Physician (Hematology)  Date of Service:  04/17/2020  CHIEF COMPLAINT: F/u of colon cancer  SUMMARY OF ONCOLOGIC HISTORY: Oncology History Overview Note  Cancer Staging Cancer of sigmoid colon [facility]) Staging form: Colon and Rectum, AJCC 8th Edition - Pathologic stage from 11/22/2019: Stage IIA (pT3, pN0, cM0) - Signed by [name], MD on 12/17/2019  Malignant neoplasm of lower-outer quadrant of right breast of female, estrogen receptor positive ([place]) Staging form: Breast, AJCC 8th Edition - Clinical: Stage IIB (cT2, cN1, cM0, G3, ER+, PR+, HER2-) - Unsigned - Pathologic: Stage IIA (pT2, pN1(sn), cM0, G3, ER+, PR+, HER2-) - Unsigned    Malignant neoplasm of lower-outer quadrant of right breast of female, estrogen receptor positive ([facility]onia Sandoval)  10/23/2017 Initial Diagnosis    right breast lower outer quadrant biopsy 10/23/2017 for a clinical T2 N1, stage IIB invasive ductal carcinoma, grade 3, estrogen and progesterone receptor positive, HER-2 not amplified, with an MIB-1 of 20%   11/07/2017 Miscellaneous   Mammaprint on was read as low risk, predicting no significant chemotherapy benefit with a 5-year distant disease-free survival in the 97-98% range with hormone therapy alone   12/05/2017 Surgery   status post bilateral mastectomies on  12/05/2017 showing             (a) on the left, no evidence of malignancy             (b) on the right, a pT2 pN1, stage IIB invasive ductal carcinoma, grade 3, with negative margins. By Dr Brantley Stage   01/15/2018 - 02/28/2018 Radiation Therapy   adjuvant radiation 01/15/2018 - 02/28/2018 with Dr Lisbeth Renshaw              Site/dose: The patient initially received a dose of 50.4 Gy in 28 fractions to the chest wall and supraclavicular region. This was delivered using a 3-D conformal, 4 field technique. The patient then received a boost to the mastectomy scar. This delivered an additional 10 Gy in 5 fractions using an en face electron field. The total dose was 60.4 Gy.    04/01/2018 -  Anti-estrogen oral therapy   started anastrozole              (a) bone density 03/07/2018 with a T score of -1.8   10/28/2019 Genetic Testing   Negative genetic testing.  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  The Common Hereditary Gene Panel offered by [facility] includes sequencing and/or deletion duplication testing of the following 48 genes: APC, ATM, AXIN2, BARD1, BMPR1A, BRCA1, BRCA2, BRIP1, CDH1, CDK4, CDKN2A (p14ARF), CDKN2A (p16INK4a), CHEK2, CTNNA1, DICER1, EPCAM (Deletion/duplication testing only), GREM1 (promoter region deletion/duplication testing only), KIT, MEN1, MLH1, MSH2, MSH3, MSH6, MUTYH, NBN, NF1, NHTL1, PALB2, PDGFRA, PMS2, POLD1, POLE, PTEN, RAD50, RAD51C, RAD51D, RNF43, SDHB, SDHC, SDHD, SMAD4, SMARCA4. STK11, TP53, TSC1, TSC2, and VHL.  The following genes were evaluated for sequence changes only: SDHA and HOXB13 c.251G>A variant only. The report  date is October 28, 2019.   Cancer of sigmoid colon ([name])  09/23/2019 Tumor Marker   Baseline CEA 1.4 on 09/23/19    09/23/2019 Procedure   Colonoscopy and Upper endoscopy with Dr Alessandra Bevels 09/23/19  Upper Endoscopy Impression - Z-line regular, 35 cm from the incisors. - Small hiatal hernia. - Non-bleeding gastric ulcer.  Biopsied. - Hematin (altered blood/coffee-ground-like material) in the gastric body. - Normal duodenal bulb, first portion of the duodenum and second portion of - Normal duodenal bulb, first portion of the duodenum and second portion of the duodenum.  Colonoscopy Impression - Preparation of the colon was poor. - Perianal skin tags found on perianal exam. - Likely malignant tumor in the recto-sigmoid colon. Biopsied. Tattooed. - Internal hemorrhoids.   09/23/2019 Initial Biopsy   FINAL MICROSCOPIC DIAGNOSIS: 09/23/19 A. STOMACH, ULCER, BIOPSY:  - Gastric antral mucosa with mild nonspecific reactive gastropathy  - Warthin Starry stain is negative for Helicobacter pylori   B. COLON, RECTOSIGMOID, MASS, BIOPSY:  - Adenocarcinoma, see comment  COMMENT:   B.  Immunohistochemical stains for MMR-related proteins are pending and  will be reported in an addendum. Dr. Saralyn Pilar reviewed the case and  concurs with the diagnosis. Dr. Alessandra Bevels was notified on 09/24/2019.     ADDENDUM:  Mismatch Repair Protein (IHC)  SUMMARY INTERPRETATION: NORMAL  There is preserved expression of the major MMR proteins. There is a very  low probability that microsatellite instability (MSI) is present.  However, certain clinically significant MMR protein mutations may result  in preservation of nuclear expression. It is recommended that the  preservation of protein expression be correlated with molecular based  MSI testing.   IHC EXPRESSION RESULTS  TEST           RESULT  MLH1:          Preserved nuclear expression  MSH2:          Preserved nuclear expression  MSH6:          Preserved nuclear expression  PMS2:          Preserved nuclear expression    09/23/2019 Imaging   CT CAP W Contrast 09/23/19  IMPRESSION: 1. There is eccentric wall thickening of the rectosigmoid colon, in keeping with mass identified and biopsied by colonoscopy.   2. There are prominent subcentimeter lymph nodes in the  sigmoid mesocolon, suspicious for nodal metastatic disease. There is no other lymphadenopathy in the abdomen or pelvis.   3. There are multiple small bilateral pulmonary nodules measuring up to 4 mm, nonspecific although very suspicious for pulmonary metastatic disease. Given history of breast malignancy, previous imaging of the chest would be very helpful to assess for stability. This report may be addended for comparison if prior imaging can be obtained.   4. Status post bilateral mastectomy with irregular subpleural opacity of the anterior right upper lobe, consistent with history of breast malignancy and radiation fibrosis.   5. There is adjacent nodularity of the right pulmonary apex, the largest discrete nodular component measuring up to 1.1 cm. This is more favored infectious or inflammatory although nonspecific and metastatic disease is not excluded. As as above, prior imaging would be very helpful to assess.   6. Small focus of nonspecific infectious or inflammatory airspace disease of the perihilar left upper lobe.   09/26/2019 Initial Diagnosis   Cancer of sigmoid colon ([place])   10/28/2019 PET scan   IMPRESSION: 1. Increased tracer uptake within the pelvis corresponding to wall thickening of the rectosigmoid  colon favored to represent primary colorectal carcinoma. No findings of FDG avid nodal metastasis or solid organ metastasis within the abdomen or pelvis. 2. Within the right lung apex there is a cluster of 3 subpleural nodular densities which exhibit varying degrees of mild FDG uptake with SUV max between 1.9 and 3.1. Morphologically the appearance is suggestive of benign postinflammatory pleuroparenchymal scarring. This would be an atypical pattern of metastatic disease, which is considered less favored, but not excluded. Attention in this area on serial follow-up imaging is advised. 3. Two small, less than 5 mm left lower lobe lung nodules are too small to  reliably characterize. 4.  Aortic Atherosclerosis (ICD10-I70.0). 5. Nonobstructing right renal calculus.     11/22/2019 Cancer Staging   Staging form: Colon and Rectum, AJCC 8th Edition - Pathologic stage from 11/22/2019: Stage IIA (pT3, pN0, cM0) - Signed by [name], MD on 12/17/2019   11/22/2019 Surgery   XI ROBOT ASSISTED SIGMOIDECTOMY and FLEXIBLE SIGMOIDOSCOPY by Dr white and Dr Marcello Moores   11/22/2019 Pathology Results   SURGICAL PATHOLOGY  CASE: [id number]  PATIENT: Renelda Mom  Surgical Pathology Report      Clinical History: colon cancer      FINAL MICROSCOPIC DIAGNOSIS:   A. SIGMOID COLON, RESECTION:  - Invasive moderately differentiated adenocarcinoma, 4.5 cm, involving  distal sigmoid colon  - Carcinoma focally invades into pericolonic soft tissue  - Resection margins are negative for carcinoma  - Negative for lymphovascular or perineural invasion  - Eighteen benign lymph nodes (0/18)  - See oncology table   B. FINAL DISTAL MARGIN, EXCISION:  - Colonic donut, negative for carcinoma    04/15/2020 Imaging   CT Chest  IMPRESSION: 1. New nodular 1.9 cm focus of consolidation in the anterior right lower lobe with surrounding mild patchy tree-in-bud opacity, nonspecific but favoring infectious/inflammatory etiology. 2. Clustered nodularity in the apical right upper lobe is stable in size and newly cavitary. These findings are indeterminate for atypical infection such as MAI versus response to therapy of pulmonary metastases. Additional scattered tiny left pulmonary nodules are stable. Suggest close chest CT follow-up in 3 months. 3. No thoracic adenopathy. 4. Stable dilated main pulmonary artery, suggesting chronic pulmonary arterial hypertension. 5. Aortic Atherosclerosis (ICD10-I70.0).      CURRENT THERAPY:  Surveillance   INTERVAL HISTORY:  MERON BOCCHINO is here for a follow up of colon cancer. She presents to the clinic alone. She notes she  started having fairly significant right chest pain, mostly when she is sitting. This will last 1-2 minutes at a time and occur 0-3 times a day. She feels this is in her muscle although she is not moving when it happens. She notes normal shoulder ROM. She notes she has had a cough since the middle of 01/2020 which she attributes to change in her carpet but has not resolved.    REVIEW OF SYSTEMS:   Constitutional: Denies fevers, chills or abnormal weight loss Eyes: Denies blurriness of vision Ears, nose, mouth, throat, and face: Denies mucositis or sore throat Respiratory: Denies dyspnea or wheezes (+) Cough  Cardiovascular: Denies palpitation, chest discomfort or lower extremity swelling Gastrointestinal:  Denies nausea, heartburn or change in bowel habits Skin: Denies abnormal skin rashes Lymphatics: Denies new lymphadenopathy or easy bruising Neurological:Denies numbness, tingling or new weaknesses Behavioral/Psych: Mood is stable, no new changes  MSK: (+) Left chest wall pain, intermittent All other systems were reviewed with the patient and are negative.  MEDICAL HISTORY:  Past Medical History:  Diagnosis Date  . Allergies 1995   Per patient report 11/14/18  . Breast cancer ([place])   . Breast cancer, right breast ([place]) 09/2017   S/P mastectomy 12/05/2017  . Family history of breast cancer   . Headache 1968   per patient report, started as a teenager  . 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  . Osteopenia 03/2018   Seen on DEXA Scan  . PONV (postoperative nausea and vomiting)     SURGICAL HISTORY: Past Surgical History:  Procedure Laterality Date  . BACK SURGERY  2003   Patient will occassional back pain since 2003  . BIOPSY  09/23/2019   Procedure: BIOPSY;  Surgeon: Otis Brace, MD;  Location: [facility];  Service: Gastroenterology;;  EGD and COLON  . BREAST  BIOPSY Right 09/2017  . [place]; 1992  . COLONOSCOPY WITH PROPOFOL N/A 09/23/2019   Procedure: COLONOSCOPY WITH PROPOFOL and EGD;  Surgeon: Otis Brace, MD;  Location: [facility];  Service: Gastroenterology;  Laterality: N/A;  . DILATION AND CURETTAGE OF UTERUS    . ESOPHAGOGASTRODUODENOSCOPY N/A 09/23/2019   Procedure: ESOPHAGOGASTRODUODENOSCOPY (EGD);  Surgeon: Otis Brace, MD;  Location: [facility];  Service: Gastroenterology;  Laterality: N/A;  . FLEXIBLE SIGMOIDOSCOPY N/A 11/22/2019   Procedure: FLEXIBLE SIGMOIDOSCOPY;  Surgeon: [name], MD;  Location: [facility];  Service: General;  Laterality: N/A;  . LUMBAR [place] SURGERY  2003  . MASTECTOMY Left 12/05/2017   PROPHYLACTIC MASTECTOMY  . MASTECTOMY COMPLETE / SIMPLE W/ SENTINEL NODE BIOPSY Right 12/05/2017   WITH RADIOACTIVE SEED TARGETED RIGHT AXIILARY LYMPH NODE EXCISION AND RIGHT SENTINEL LYMPH NODE BIOPSY,   . MASTECTOMY WITH RADIOACTIVE SEED GUIDED EXCISION AND AXILLARY SENTINEL LYMPH NODE BIOPSY Bilateral 12/05/2017   Procedure: RIGHT SIMPLE MASTECTOMY WITH RADIOACTIVE SEED TARGETED RIGHT AXIILARY LYMPH NODE EXCISION AND RIGHT SENTINEL LYMPH NODE BIOPSY, LEFT PROPHYLACTIC MASTECTOMY;  Surgeon: [name], MD;  Location: [place];  Service: General;  Laterality: Bilateral;  . SUBMUCOSAL TATTOO INJECTION  09/23/2019   Procedure: SUBMUCOSAL TATTOO INJECTION;  Surgeon: Otis Brace, MD;  Location: [facility];  Service: Gastroenterology;;    I have reviewed the social history and family history with the patient and they are unchanged from previous note.  ALLERGIES:  is allergic to codeine.  MEDICATIONS:  Current Outpatient Medications  Medication Sig Dispense Refill  . anastrozole (ARIMIDEX) 1 MG tablet TAKE 1 TABLET BY MOUTH EVERY DAY 90 tablet 4  . calcium-vitamin D (OSCAL WITH D) 500-200 MG-UNIT tablet Take 1 tablet by mouth daily.    . Prenatal Vit-Fe Fumarate-FA (PRENATAL MULTIVITAMIN)  TABS tablet Take 1 tablet by mouth daily. Gummie    . Pseudoephedrine-Naproxen Na (ALEVE-D SINUS & HEADACHE PO) Take by mouth as needed.    . Vitamin E 180 MG CAPS Take 180 mg by mouth daily.      No current facility-administered medications for this visit.    PHYSICAL EXAMINATION: ECOG PERFORMANCE STATUS: 0 - Asymptomatic  Vitals:   04/17/20 1047  BP: (!) 142/77  Pulse: 93  Resp: 20  Temp: 97.8 F (36.6 C)  SpO2: 99%   Filed Weights   04/17/20 1047  Weight: 144 lb 9.6 oz (65.6 kg)    Due to COVID19 we will limit examination to appearance. Patient had no complaints.  GENERAL:alert, no distress and comfortable SKIN: skin color normal, no rashes or significant lesions EYES:  normal, Conjunctiva are pink and non-injected, sclera clear  NEURO: alert & oriented x 3 with fluent speech   LABORATORY DATA:  I have reviewed the data as listed CBC Latest Ref Rng & Units 04/15/2020 02/24/2020 12/18/2019  WBC 4.0 - 10.5 K/uL 5.1 4.6 2.9(L)  Hemoglobin 12.0 - 15.0 g/dL 12.4 12.4 13.1  Hematocrit 36.0 - 46.0 % 37.4 37.4 39.2  Platelets 150 - 400 K/uL 229 276 193     CMP Latest Ref Rng & Units 04/15/2020 02/24/2020 12/18/2019  Glucose 70 - 99 mg/dL 101(H) 106(H) 98  BUN 8 - 23 mg/dL _0 Creatinine 0.44 - 1.00 mg/dL 0.85 0.78 0.78  Sodium 135 - 145 mmol/L 140 137 138  Potassium 3.5 - 5.1 mmol/L 4.0 4.1 4.2  Chloride 98 - 111 mmol/L 106 103 106  CO2 22 - 32 mmol/L _1 Calcium 8.9 - 10.3 mg/dL 9.5 9.3 9.7  Total Protein 6.5 - 8.1 g/dL 7.2 6.8 6.8  Total Bilirubin 0.3 - 1.2 mg/dL 0.7 0.3 0.4  Alkaline Phos 38 - 126 U/L 73 93 61  AST 15 - 41 U/L _2 ALT 0 - 44 U/L _3 RADIOGRAPHIC STUDIES: I have personally reviewed the radiological images as listed and agreed with the findings in the report. CT Chest W Contrast  Result Date: 04/16/2020 CLINICAL DATA:  Follow-up bilateral pulmonary nodules. Colon cancer with ongoing oral chemotherapy. History of  right breast cancer 2019 status post radiation therapy and bilateral mastectomy. EXAM: CT CHEST WITH CONTRAST TECHNIQUE: Multidetector CT imaging of the chest was performed during intravenous contrast administration. CONTRAST:  52m OMNIPAQUE IOHEXOL 300 MG/ML  SOLN COMPARISON:  10/28/2019 PET-CT.  09/23/2019 chest CT. FINDINGS: Cardiovascular: Normal heart size. No significant pericardial effusion/thickening. Atherosclerotic nonaneurysmal thoracic aorta. Dilated main pulmonary artery (3.5 cm diameter), stable. No central pulmonary emboli. Mediastinum/Nodes: No discrete thyroid nodules. Unremarkable esophagus. Surgical clips noted in the right axilla. No axillary adenopathy. No pathologically enlarged mediastinal or hilar nodes. Lungs/Pleura: No pneumothorax. No pleural effusion. Apical right upper lobe 1.4 cm pulmonary nodule is newly cavitary (series 7/image 23), previously 1.4 cm, stable in size. Adjacent apical right upper lobe 0.6 cm pulmonary nodule is also newly cavitary (series 7/image 30), previously 0.6 cm, stable in size. Separate solid peripheral apical right upper lobe 1.2 cm pulmonary nodule (series 7/image 31), previously 1.2 cm, stable. New nodular 1.9 x 0.6 cm focus of consolidation in the anterior right lower lobe (series 7/image 121) with surrounding mild patchy tree-in-bud opacity. A few scattered small peripheral left lower lobe pulmonary nodules, largest 5 mm (series 7/image 102) are all stable. Stable minimal anterior mid to upper right lung subpleural reticulation, volume loss and distortion compatible with minimal radiation fibrosis. Upper abdomen: No acute abnormality. Musculoskeletal: No aggressive appearing focal osseous lesions. Stable postsurgical changes from bilateral mastectomy. Mild thoracic spondylosis. IMPRESSION: 1. New nodular 1.9 cm focus of consolidation in the anterior right lower lobe with surrounding mild patchy tree-in-bud opacity, nonspecific but favoring  infectious/inflammatory etiology. 2. Clustered nodularity in the apical right upper lobe is stable in size and newly cavitary. These findings are indeterminate for atypical infection such as MAI versus response to therapy of pulmonary metastases. Additional scattered tiny left pulmonary nodules are stable. Suggest close chest CT follow-up in 3 months. 3. No thoracic adenopathy. 4. Stable dilated main pulmonary artery, suggesting chronic pulmonary arterial hypertension. 5. Aortic Atherosclerosis (ICD10-I70.0). Electronically Signed   By:  Ilona Sorrel M.D.   On: 04/16/2020 09:03     ASSESSMENT & PLAN:  Sonia Sandoval is a 69 y.o. female with    1. Colon Cancer, pT3N0M0, stage II -She was diagnosed in 08/2019. She is found to have malignant tumor inrecto-sigmoid and biopsy confirmedadenocarcinoma.  -She proceeded with colon surgery with Dr Dema Severin and Dr Marcello Moores on 11/22/19. We discussed her surgical pathology which showed 4.5cm moderately differentiated adenocarcinoma was completely resected with negative margins, node negative.  No high risk features on the surgical pathology. -She overall had Stage II disease with about 20% risk of recurrence. Given she does not have high risk features, I do not recommend adjuvant chemotherapy.    -From a colon cancer standpoint, she is clinically doing well. Labs reviewed from this week, CBC and CMP WNL. There is no clinical concern for recurrence at this time.  -I reviewed her repeat CT chest from 04/16/20 showed stable known upper right lobe nodularity and new lower right lobe inflammatory process. Will re-evaluate with PET scan in 3 motnhs. She is agreeable.  -I again discussed the option of [place] today. She is interested at this time. Will proceed with testing today. -F/u in 3 months   2. Lung nodules  -Her initial staging CT scan for colon cancer showed bilateral multiple small nodules measuring up to 4 mm, and heterogeneous airspace opacity of  the right upper lobe, which we think is related to her previous breast radiation. -Her CT Chest from 04/16/20 showed her known indeterminate right upper lobe nodularity is stable, and  Likely benign, but there is new 1.9cm consolidation in the anterior right lower lobe. radiology feels this is possible inflammation. Pt notes dry cough since middle of September.  -will review in tumor board  -I recommend doing PET scan for re-evaluation in 2-3 months. If this is truly inflammation this should resolve. If this is growing and positive, this could be cancerous and I would recommend biopsy at that time.  3. H/o Right Breast Cancer, G3, T2N1, ER+/PR+/HER- -She was diagnosed in 09/2017. She was treated with B/l mastectomies by Dr Brantley Stage and adjuvant radiation by Dr Lisbeth Renshaw  -She is currently on AI with Anastrozole since 04/2018. Tolerating well.  -She is being followed by Dr Jana Hakim.   4. 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 I recommend she stop given she will be on chemotherapy and her overall cancer treatment. She is willing to stop drinking.   PLAN: -CT scan reviewed -Proceed with [place] today  -will present in GI conference  -F/u in first week of March with lab and PET scan a few days before.    No problem-specific Assessment & Plan notes found for this encounter.   Orders Placed This Encounter  Procedures  . NM PET Image Restag (PS) Skull Base To Thigh    Standing Status:   Future    Standing Expiration Date:   04/17/2021    Order Specific Question:   If indicated for the ordered procedure, I authorize the administration of a radiopharmaceutical per Radiology protocol    Answer:   Yes    Order Specific Question:   Preferred imaging location?    Answer:   [facility]    Order Specific Question:   Release to patient    Answer:   Immediate  . [place]  Standing Status:   Future    Number of Occurrences:   1    Standing Expiration Date:   04/17/2021   All questions were answered. The patient knows to call the clinic with any problems, questions or concerns. No barriers to learning was detected. The total time spent in the appointment was 30 minutes.     [name], MD 04/17/2020   I, Joslyn Devon, am acting as scribe for [name], MD.   I have reviewed the above documentation for accuracy and completeness, and I agree with the above.

## 2020-04-17 ENCOUNTER — Inpatient Hospital Stay (HOSPITAL_BASED_OUTPATIENT_CLINIC_OR_DEPARTMENT_OTHER): Payer: BC Managed Care – PPO | Admitting: Hematology

## 2020-04-17 ENCOUNTER — Other Ambulatory Visit: Payer: Self-pay

## 2020-04-17 ENCOUNTER — Inpatient Hospital Stay: Payer: BC Managed Care – PPO

## 2020-04-17 ENCOUNTER — Encounter: Payer: Self-pay | Admitting: Hematology

## 2020-04-17 VITALS — BP 142/77 | HR 93 | Temp 97.8°F | Resp 20 | Ht 63.0 in | Wt 144.6 lb

## 2020-04-17 DIAGNOSIS — C50511 Malignant neoplasm of lower-outer quadrant of right female breast: Secondary | ICD-10-CM

## 2020-04-17 DIAGNOSIS — C187 Malignant neoplasm of sigmoid colon: Secondary | ICD-10-CM

## 2020-04-17 DIAGNOSIS — Z170 Estrogen receptor positive status [ER+]: Secondary | ICD-10-CM | POA: Diagnosis not present

## 2020-04-20 ENCOUNTER — Telehealth: Payer: Self-pay | Admitting: Hematology

## 2020-04-20 NOTE — Telephone Encounter (Signed)
Scheduled appts per 12/17 los. Pt confirmed appt dates and times.

## 2020-04-21 ENCOUNTER — Other Ambulatory Visit: Payer: Self-pay

## 2020-04-21 ENCOUNTER — Ambulatory Visit
Admission: RE | Admit: 2020-04-21 | Discharge: 2020-04-21 | Disposition: A | Discharge status: Home / Self Care | Payer: Medicare Other | Source: Ambulatory Visit | Attending: Oncology | Admitting: Oncology

## 2020-04-21 DIAGNOSIS — C50511 Malignant neoplasm of lower-outer quadrant of right female breast: Secondary | ICD-10-CM

## 2020-04-21 DIAGNOSIS — C187 Malignant neoplasm of sigmoid colon: Secondary | ICD-10-CM

## 2020-05-13 ENCOUNTER — Encounter: Payer: Self-pay | Admitting: Hematology

## 2020-06-02 ENCOUNTER — Encounter: Payer: Self-pay | Admitting: Hematology

## 2020-06-02 LAB — GUARDANT 360

## 2020-06-29 NOTE — Progress Notes (Incomplete)
[place]   [phone] [phone]   Clinic Follow up Note   Patient Care Team: [name], [place] as PCP - General (Nurse Practitioner) [name], MD as Consulting Physician (General Surgery) Magrinat, [name], MD as Consulting Physician (Oncology) [name], MD as Consulting Physician (Radiation Oncology) [name], RN as Registered Nurse [name], MD as Referring Physician (Dermatology) [name], RN as Oncology Nurse Navigator [name], MD as Consulting Physician (Hematology) [name], MD as Consulting Physician (General Surgery) [name], MD as Consulting Physician (Hematology)  Date of Service:  06/29/2020  CHIEF COMPLAINT: F/u of colon cancer  SUMMARY OF ONCOLOGIC HISTORY: Oncology History Overview Note  Cancer Staging Cancer of sigmoid colon [facility]) Staging form: Colon and Rectum, AJCC 8th Edition - Pathologic stage from 11/22/2019: Stage IIA (pT3, pN0, cM0) - Signed by [name], MD on 12/17/2019  Malignant neoplasm of lower-outer quadrant of right breast of female, estrogen receptor positive ([place]) Staging form: Breast, AJCC 8th Edition - Clinical: Stage IIB (cT2, cN1, cM0, G3, ER+, PR+, HER2-) - Unsigned - Pathologic: Stage IIA (pT2, pN1(sn), cM0, G3, ER+, PR+, HER2-) - Unsigned    Malignant neoplasm of lower-outer quadrant of right breast of female, estrogen receptor positive ([place])  10/23/2017 Initial Diagnosis    right breast lower outer quadrant biopsy 10/23/2017 for a clinical T2 N1, stage IIB invasive ductal carcinoma, grade 3, estrogen and progesterone receptor positive, HER-2 not amplified, with an MIB-1 of 20%   11/07/2017 Miscellaneous   Mammaprint on was read as low risk, predicting no significant chemotherapy benefit with a 5-year distant disease-free survival in the 97-98% range with hormone therapy alone   12/05/2017 Surgery   status post bilateral mastectomies on 12/05/2017  showing             (a) on the left, no evidence of malignancy             (b) on the right, a pT2 pN1, stage IIB invasive ductal carcinoma, grade 3, with negative margins. By Dr Brantley Stage   01/15/2018 - 02/28/2018 Radiation Therapy   adjuvant radiation 01/15/2018 - 02/28/2018 with Dr Lisbeth Renshaw              Site/dose: The patient initially received a dose of 50.4 Gy in 28 fractions to the chest wall and supraclavicular region. This was delivered using a 3-D conformal, 4 field technique. The patient then received a boost to the mastectomy scar. This delivered an additional 10 Gy in 5 fractions using an en face electron field. The total dose was 60.4 Gy.    04/01/2018 -  Anti-estrogen oral therapy   started anastrozole              (a) bone density 03/07/2018 with a T score of -1.8   10/28/2019 Genetic Testing   Negative genetic testing.  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  The Common Hereditary Gene Panel offered by [facility] includes sequencing and/or deletion duplication testing of the following 48 genes: APC, ATM, AXIN2, BARD1, BMPR1A, BRCA1, BRCA2, BRIP1, CDH1, CDK4, CDKN2A (p14ARF), CDKN2A (p16INK4a), CHEK2, CTNNA1, DICER1, EPCAM (Deletion/duplication testing only), GREM1 (promoter region deletion/duplication testing only), KIT, MEN1, MLH1, MSH2, MSH3, MSH6, MUTYH, NBN, NF1, NHTL1, PALB2, PDGFRA, PMS2, POLD1, POLE, PTEN, RAD50, RAD51C, RAD51D, RNF43, SDHB, SDHC, SDHD, SMAD4, SMARCA4. STK11, TP53, TSC1, TSC2, and VHL.  The following genes were evaluated for sequence changes only: SDHA and HOXB13 c.251G>A variant only. The report  date is October 28, 2019.   Cancer of sigmoid colon ([place])  09/23/2019 Tumor Marker   Baseline CEA 1.4 on 09/23/19    09/23/2019 Procedure   Colonoscopy and Upper endoscopy with Dr Alessandra Bevels 09/23/19  Upper Endoscopy Impression - Z-line regular, 35 cm from the incisors. - Small hiatal hernia. - Non-bleeding gastric ulcer. Biopsied. - Hematin  (altered blood/coffee-ground-like material) in the gastric body. - Normal duodenal bulb, first portion of the duodenum and second portion of - Normal duodenal bulb, first portion of the duodenum and second portion of the duodenum.  Colonoscopy Impression - Preparation of the colon was poor. - Perianal skin tags found on perianal exam. - Likely malignant tumor in the recto-sigmoid colon. Biopsied. Tattooed. - Internal hemorrhoids.   09/23/2019 Initial Biopsy   FINAL MICROSCOPIC DIAGNOSIS: 09/23/19 A. STOMACH, ULCER, BIOPSY:  - Gastric antral mucosa with mild nonspecific reactive gastropathy  - Warthin Starry stain is negative for Helicobacter pylori   B. COLON, RECTOSIGMOID, MASS, BIOPSY:  - Adenocarcinoma, see comment  COMMENT:   B.  Immunohistochemical stains for MMR-related proteins are pending and  will be reported in an addendum. Dr. Saralyn Pilar reviewed the case and  concurs with the diagnosis. Dr. Alessandra Bevels was notified on 09/24/2019.     ADDENDUM:  Mismatch Repair Protein (IHC)  SUMMARY INTERPRETATION: NORMAL  There is preserved expression of the major MMR proteins. There is a very  low probability that microsatellite instability (MSI) is present.  However, certain clinically significant MMR protein mutations may result  in preservation of nuclear expression. It is recommended that the  preservation of protein expression be correlated with molecular based  MSI testing.   IHC EXPRESSION RESULTS  TEST           RESULT  MLH1:          Preserved nuclear expression  MSH2:          Preserved nuclear expression  MSH6:          Preserved nuclear expression  PMS2:          Preserved nuclear expression    09/23/2019 Imaging   CT CAP W Contrast 09/23/19  IMPRESSION: 1. There is eccentric wall thickening of the rectosigmoid colon, in keeping with mass identified and biopsied by colonoscopy.   2. There are prominent subcentimeter lymph nodes in the sigmoid mesocolon, suspicious  for nodal metastatic disease. There is no other lymphadenopathy in the abdomen or pelvis.   3. There are multiple small bilateral pulmonary nodules measuring up to 4 mm, nonspecific although very suspicious for pulmonary metastatic disease. Given history of breast malignancy, previous imaging of the chest would be very helpful to assess for stability. This report may be addended for comparison if prior imaging can be obtained.   4. Status post bilateral mastectomy with irregular subpleural opacity of the anterior right upper lobe, consistent with history of breast malignancy and radiation fibrosis.   5. There is adjacent nodularity of the right pulmonary apex, the largest discrete nodular component measuring up to 1.1 cm. This is more favored infectious or inflammatory although nonspecific and metastatic disease is not excluded. As as above, prior imaging would be very helpful to assess.   6. Small focus of nonspecific infectious or inflammatory airspace disease of the perihilar left upper lobe.   09/26/2019 Initial Diagnosis   Cancer of sigmoid colon ([place])   10/28/2019 PET scan   IMPRESSION: 1. Increased tracer uptake within the pelvis corresponding to wall thickening of the rectosigmoid  colon favored to represent primary colorectal carcinoma. No findings of FDG avid nodal metastasis or solid organ metastasis within the abdomen or pelvis. 2. Within the right lung apex there is a cluster of 3 subpleural nodular densities which exhibit varying degrees of mild FDG uptake with SUV max between 1.9 and 3.1. Morphologically the appearance is suggestive of benign postinflammatory pleuroparenchymal scarring. This would be an atypical pattern of metastatic disease, which is considered less favored, but not excluded. Attention in this area on serial follow-up imaging is advised. 3. Two small, less than 5 mm left lower lobe lung nodules are too small to reliably characterize. 4.  Aortic  Atherosclerosis (ICD10-I70.0). 5. Nonobstructing right renal calculus.     11/22/2019 Cancer Staging   Staging form: Colon and Rectum, AJCC 8th Edition - Pathologic stage from 11/22/2019: Stage IIA (pT3, pN0, cM0) - Signed by Malachy Mood, MD on 12/17/2019   11/22/2019 Surgery   XI ROBOT ASSISTED SIGMOIDECTOMY and FLEXIBLE SIGMOIDOSCOPY by Dr white and Dr Maisie Fus   11/22/2019 Pathology Results   SURGICAL PATHOLOGY  CASE: [id number]  PATIENT: Sonia Sandoval  Surgical Pathology Report      Clinical History: colon cancer      FINAL MICROSCOPIC DIAGNOSIS:   A. SIGMOID COLON, RESECTION:  - Invasive moderately differentiated adenocarcinoma, 4.5 cm, involving  distal sigmoid colon  - Carcinoma focally invades into pericolonic soft tissue  - Resection margins are negative for carcinoma  - Negative for lymphovascular or perineural invasion  - Eighteen benign lymph nodes (0/18)  - See oncology table   B. FINAL DISTAL MARGIN, EXCISION:  - Colonic donut, negative for carcinoma    04/15/2020 Imaging   CT Chest  IMPRESSION: 1. New nodular 1.9 cm focus of consolidation in the anterior right lower lobe with surrounding mild patchy tree-in-bud opacity, nonspecific but favoring infectious/inflammatory etiology. 2. Clustered nodularity in the apical right upper lobe is stable in size and newly cavitary. These findings are indeterminate for atypical infection such as MAI versus response to therapy of pulmonary metastases. Additional scattered tiny left pulmonary nodules are stable. Suggest close chest CT follow-up in 3 months. 3. No thoracic adenopathy. 4. Stable dilated main pulmonary artery, suggesting chronic pulmonary arterial hypertension. 5. Aortic Atherosclerosis (ICD10-I70.0).      CURRENT THERAPY:  Surveillance  INTERVAL HISTORY: *** Sonia Sandoval is here for a follow up of colon cancer. She presents to the clinic alone.    REVIEW OF SYSTEMS:  *** Constitutional:  Denies fevers, chills or abnormal weight loss Eyes: Denies blurriness of vision Ears, nose, mouth, throat, and face: Denies mucositis or sore throat Respiratory: Denies cough, dyspnea or wheezes Cardiovascular: Denies palpitation, chest discomfort or lower extremity swelling Gastrointestinal:  Denies nausea, heartburn or change in bowel habits Skin: Denies abnormal skin rashes Lymphatics: Denies new lymphadenopathy or easy bruising Neurological:Denies numbness, tingling or new weaknesses Behavioral/Psych: Mood is stable, no new changes  All other systems were reviewed with the patient and are negative.  MEDICAL HISTORY:  Past Medical History:  Diagnosis Date  . Allergies 1995   Per patient report 11/14/18  . Breast cancer ([facility])   . Breast cancer, right breast ([facility]) 09/2017   S/P mastectomy 12/05/2017  . Family history of breast cancer   . Headache 1968   per patient report, started as a teenager  . 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  Marland Kitchen  Osteopenia 03/2018   Seen on DEXA Scan  . PONV (postoperative nausea and vomiting)     SURGICAL HISTORY: Past Surgical History:  Procedure Laterality Date  . BACK SURGERY  2003   Patient will occassional back pain since 2003  . BIOPSY  09/23/2019   Procedure: BIOPSY;  Surgeon: Kathi Der, MD;  Location: [facility];  Service: Gastroenterology;;  EGD and COLON  . BREAST BIOPSY Right 09/2017  . CESAREAN SECTION  1985; 1992  . COLONOSCOPY WITH PROPOFOL N/A 09/23/2019   Procedure: COLONOSCOPY WITH PROPOFOL and EGD;  Surgeon: Kathi Der, MD;  Location: [facility];  Service: Gastroenterology;  Laterality: N/A;  . DILATION AND CURETTAGE OF UTERUS    . ESOPHAGOGASTRODUODENOSCOPY N/A 09/23/2019   Procedure: ESOPHAGOGASTRODUODENOSCOPY (EGD);  Surgeon: Kathi Der, MD;  Location: [facility];  Service: Gastroenterology;   Laterality: N/A;  . FLEXIBLE SIGMOIDOSCOPY N/A 11/22/2019   Procedure: FLEXIBLE SIGMOIDOSCOPY;  Surgeon: Andria Meuse, MD;  Location: [facility];  Service: General;  Laterality: N/A;  . LUMBAR DISC SURGERY  2003  . MASTECTOMY Left 12/05/2017   PROPHYLACTIC MASTECTOMY  . MASTECTOMY COMPLETE / SIMPLE W/ SENTINEL NODE BIOPSY Right 12/05/2017   WITH RADIOACTIVE SEED TARGETED RIGHT AXIILARY LYMPH NODE EXCISION AND RIGHT SENTINEL LYMPH NODE BIOPSY,   . MASTECTOMY WITH RADIOACTIVE SEED GUIDED EXCISION AND AXILLARY SENTINEL LYMPH NODE BIOPSY Bilateral 12/05/2017   Procedure: RIGHT SIMPLE MASTECTOMY WITH RADIOACTIVE SEED TARGETED RIGHT AXIILARY LYMPH NODE EXCISION AND RIGHT SENTINEL LYMPH NODE BIOPSY, LEFT PROPHYLACTIC MASTECTOMY;  Surgeon: Harriette Bouillon, MD;  Location: [facility];  Service: General;  Laterality: Bilateral;  . SUBMUCOSAL TATTOO INJECTION  09/23/2019   Procedure: SUBMUCOSAL TATTOO INJECTION;  Surgeon: Kathi Der, MD;  Location: [facility];  Service: Gastroenterology;;    I have reviewed the social history and family history with the patient and they are unchanged from previous note.  ALLERGIES:  is allergic to codeine.  MEDICATIONS:  Current Outpatient Medications  Medication Sig Dispense Refill  . anastrozole (ARIMIDEX) 1 MG tablet TAKE 1 TABLET BY MOUTH EVERY DAY 90 tablet 4  . calcium-vitamin D (OSCAL WITH D) 500-200 MG-UNIT tablet Take 1 tablet by mouth daily.    . Prenatal Vit-Fe Fumarate-FA (PRENATAL MULTIVITAMIN) TABS tablet Take 1 tablet by mouth daily. Gummie    . Pseudoephedrine-Naproxen Na (ALEVE-D SINUS & HEADACHE PO) Take by mouth as needed.    . Vitamin E 180 MG CAPS Take 180 mg by mouth daily.      No current facility-administered medications for this visit.    PHYSICAL EXAMINATION: ECOG PERFORMANCE STATUS: {CHL ONC ECOG [phone]}  There were no vitals filed for this visit. There were no vitals filed for this visit. *** GENERAL:alert, no distress  and comfortable SKIN: skin color, texture, turgor are normal, no rashes or significant lesions EYES: normal, Conjunctiva are pink and non-injected, sclera clear {OROPHARYNX:no exudate, no erythema and lips, buccal mucosa, and tongue normal}  NECK: supple, thyroid normal size, non-tender, without nodularity LYMPH:  no palpable lymphadenopathy in the cervical, axillary {or inguinal} LUNGS: clear to auscultation and percussion with normal breathing effort HEART: regular rate & rhythm and no murmurs and no lower extremity edema ABDOMEN:abdomen soft, non-tender and normal bowel sounds Musculoskeletal:no cyanosis of digits and no clubbing  NEURO: alert & oriented x 3 with fluent speech, no focal motor/sensory deficits  LABORATORY DATA:  I have reviewed the data as listed CBC Latest Ref Rng & Units 04/15/2020 02/24/2020 12/18/2019  WBC 4.0 - 10.5 K/uL 5.1 4.6  2.9(L)  Hemoglobin 12.0 - 15.0 g/dL 12.4 12.4 13.1  Hematocrit 36.0 - 46.0 % 37.4 37.4 39.2  Platelets 150 - 400 K/uL 229 276 193     CMP Latest Ref Rng & Units 04/15/2020 02/24/2020 12/18/2019  Glucose 70 - 99 mg/dL 101(H) 106(H) 98  BUN 8 - 23 mg/dL $Remove'20 20 14  'CSDLQqB$ Creatinine 0.44 - 1.00 mg/dL 0.85 0.78 0.78  Sodium 135 - 145 mmol/L 140 137 138  Potassium 3.5 - 5.1 mmol/L 4.0 4.1 4.2  Chloride 98 - 111 mmol/L 106 103 106  CO2 22 - 32 mmol/L $RemoveB'28 28 25  'EPumVhOU$ Calcium 8.9 - 10.3 mg/dL 9.5 9.3 9.7  Total Protein 6.5 - 8.1 g/dL 7.2 6.8 6.8  Total Bilirubin 0.3 - 1.2 mg/dL 0.7 0.3 0.4  Alkaline Phos 38 - 126 U/L 73 93 61  AST 15 - 41 U/L $Remo'30 24 24  'xxcEc$ ALT 0 - 44 U/L $Remo'31 29 19      'LmwRo$ RADIOGRAPHIC STUDIES: I have personally reviewed the radiological images as listed and agreed with the findings in the report. No results found.   ASSESSMENT & PLAN:  EILIDH MARCANO is a 70 y.o. female with   1. Colon Cancer,pT3N0M0, stage II -She was diagnosed in 08/2019. She is found to have malignant tumor inrecto-sigmoid and biopsy confirmedadenocarcinoma.   -She proceeded with colon surgery with Dr Dema Severin and Dr Marcello Moores on 11/22/19. We discussed her surgical pathology which showed4.5cm moderately differentiatedadenocarcinomawas completely resected with negative margins, node negative. No high risk features on the surgical pathology. -She overall had Stage II disease with about 20% risk of recurrence. Given she does not have high risk features, I do not recommend adjuvant chemotherapy.   -We discussed her PET from 07/01/20 showed *** -Her 04/17/20 Guardant Reveal showed was negative.    2. Lung nodules  -Her initial staging CT scan for colon cancer showed bilateral multiple small nodules measuring up to 4 mm, and heterogeneous airspace opacity of therightupper lobe, which we think is related to her previous breast radiation. -Her CT Chest from 04/16/20 showed her known indeterminate right upper lobe nodularity is stable, and  Likely benign, but there is new 1.9cm consolidation in the anterior right lower lobe. radiology feels this is possible inflammation. Pt notes dry cough since middle of September.  -will review in tumor board  -Her PET from 3/2/222 showed ***  3. H/o Right Breast Cancer, G3, T2N1, ER+/PR+/HER- -She was diagnosed in 09/2017. She was treated with B/l mastectomies by Dr Brantley Stage and adjuvant radiation by Dr Lisbeth Renshaw  -She is currently on AI with Anastrozole since 04/2018.Tolerating well. -She is being followed by Dr Jana Hakim.   4. Genetic Testingshowed VUS of MSH3 otherwise, negative for pathogenetic mutations  5. 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 I recommend she stop given she will be on chemotherapy and her overall cancer treatment. She is willing to stop drinking.   PLAN: ***   No problem-specific Assessment & Plan notes found for this encounter.   No orders of the defined types were placed in this encounter.  All questions were answered. The patient knows to call the clinic  with any problems, questions or concerns. No barriers to learning was detected. The total time spent in the appointment was {CHL ONC TIME VISIT - QIHKV:[id number]}.     Sonia Sandoval 06/29/2020   Oneal Deputy, am acting as scribe for [name], MD.   {Add scribe attestation statement}

## 2020-06-30 ENCOUNTER — Inpatient Hospital Stay: Payer: BC Managed Care – PPO | Attending: Hematology

## 2020-06-30 ENCOUNTER — Other Ambulatory Visit: Payer: Self-pay

## 2020-06-30 DIAGNOSIS — Z1152 Encounter for screening for COVID-19: Secondary | ICD-10-CM | POA: Diagnosis not present

## 2020-06-30 DIAGNOSIS — C50511 Malignant neoplasm of lower-outer quadrant of right female breast: Secondary | ICD-10-CM | POA: Insufficient documentation

## 2020-06-30 DIAGNOSIS — R059 Cough, unspecified: Secondary | ICD-10-CM | POA: Insufficient documentation

## 2020-06-30 DIAGNOSIS — I700 Atherosclerosis of aorta: Secondary | ICD-10-CM | POA: Insufficient documentation

## 2020-06-30 DIAGNOSIS — Z170 Estrogen receptor positive status [ER+]: Secondary | ICD-10-CM | POA: Insufficient documentation

## 2020-06-30 DIAGNOSIS — Z7289 Other problems related to lifestyle: Secondary | ICD-10-CM | POA: Diagnosis not present

## 2020-06-30 DIAGNOSIS — R0609 Other forms of dyspnea: Secondary | ICD-10-CM | POA: Diagnosis not present

## 2020-06-30 DIAGNOSIS — R911 Solitary pulmonary nodule: Secondary | ICD-10-CM | POA: Diagnosis not present

## 2020-06-30 DIAGNOSIS — R0602 Shortness of breath: Secondary | ICD-10-CM | POA: Insufficient documentation

## 2020-06-30 DIAGNOSIS — Z79899 Other long term (current) drug therapy: Secondary | ICD-10-CM | POA: Insufficient documentation

## 2020-06-30 DIAGNOSIS — C19 Malignant neoplasm of rectosigmoid junction: Secondary | ICD-10-CM | POA: Diagnosis not present

## 2020-06-30 LAB — COMPREHENSIVE METABOLIC PANEL
ALT: 34 U/L (ref 0–44)
AST: 23 U/L (ref 15–41)
Albumin: 3.9 g/dL (ref 3.5–5.0)
Alkaline Phosphatase: 111 U/L (ref 38–126)
Anion gap: 10 (ref 5–15)
BUN: 17 mg/dL (ref 8–23)
CO2: 25 mmol/L (ref 22–32)
Calcium: 9.7 mg/dL (ref 8.9–10.3)
Chloride: 104 mmol/L (ref 98–111)
Creatinine, Ser: 0.79 mg/dL (ref 0.44–1.00)
GFR, Estimated: >60 mL/min (ref >60)
Glucose, Bld: 102 mg/dL — ABNORMAL HIGH (ref 70–99)
Potassium: 4.2 mmol/L (ref 3.5–5.1)
Sodium: 139 mmol/L (ref 135–145)
Total Bilirubin: 0.5 mg/dL (ref 0.3–1.2)
Total Protein: 7.2 g/dL (ref 6.5–8.1)

## 2020-06-30 LAB — CBC WITH DIFFERENTIAL/PLATELET
Abs Immature Granulocytes: 0.01 10*3/uL (ref 0.00–0.07)
Basophils Absolute: 0 10*3/uL (ref 0.0–0.1)
Basophils Relative: 1 %
Eosinophils Absolute: 0.1 10*3/uL (ref 0.0–0.5)
Eosinophils Relative: 2 %
HCT: 38.9 % (ref 36.0–46.0)
Hemoglobin: 12.7 g/dL (ref 12.0–15.0)
Immature Granulocytes: 0 %
Lymphocytes Relative: 20 %
Lymphs Abs: 0.9 10*3/uL (ref 0.7–4.0)
MCH: 29.8 pg (ref 26.0–34.0)
MCHC: 32.6 g/dL (ref 30.0–36.0)
MCV: 91.3 fL (ref 80.0–100.0)
Monocytes Absolute: 0.4 10*3/uL (ref 0.1–1.0)
Monocytes Relative: 8 %
Neutro Abs: 3 10*3/uL (ref 1.7–7.7)
Neutrophils Relative %: 69 %
Platelets: 267 10*3/uL (ref 150–400)
RBC: 4.26 MIL/uL (ref 3.87–5.11)
RDW: 12.1 % (ref 11.5–15.5)
WBC: 4.3 10*3/uL (ref 4.0–10.5)
nRBC: 0 % (ref 0.0–0.2)

## 2020-07-01 ENCOUNTER — Ambulatory Visit (HOSPITAL_COMMUNITY)
Admission: RE | Admit: 2020-07-01 | Discharge: 2020-07-01 | Disposition: A | Discharge status: Home / Self Care | Payer: BC Managed Care – PPO | Source: Ambulatory Visit | Attending: Hematology | Admitting: Hematology

## 2020-07-01 DIAGNOSIS — R918 Other nonspecific abnormal finding of lung field: Secondary | ICD-10-CM | POA: Diagnosis not present

## 2020-07-01 DIAGNOSIS — C50511 Malignant neoplasm of lower-outer quadrant of right female breast: Secondary | ICD-10-CM | POA: Diagnosis not present

## 2020-07-01 DIAGNOSIS — Z170 Estrogen receptor positive status [ER+]: Secondary | ICD-10-CM | POA: Diagnosis present

## 2020-07-01 LAB — GLUCOSE, CAPILLARY: Glucose-Capillary: 102 mg/dL — ABNORMAL HIGH (ref 70–99)

## 2020-07-01 MED ORDER — FLUDEOXYGLUCOSE F - 18 (FDG) INJECTION
7.5000 | Freq: Once | INTRAVENOUS | Status: AC | PRN
  Administered 2020-07-01: 7.68 via INTRAVENOUS

## 2020-07-02 ENCOUNTER — Inpatient Hospital Stay (HOSPITAL_BASED_OUTPATIENT_CLINIC_OR_DEPARTMENT_OTHER): Payer: BC Managed Care – PPO | Admitting: Hematology

## 2020-07-02 ENCOUNTER — Encounter: Payer: Self-pay | Admitting: Hematology

## 2020-07-02 ENCOUNTER — Other Ambulatory Visit: Payer: Self-pay

## 2020-07-02 VITALS — BP 125/65 | HR 96 | Temp 97.5°F | Resp 20 | Ht 63.0 in | Wt 145.5 lb

## 2020-07-02 DIAGNOSIS — C19 Malignant neoplasm of rectosigmoid junction: Secondary | ICD-10-CM | POA: Diagnosis not present

## 2020-07-02 DIAGNOSIS — C50511 Malignant neoplasm of lower-outer quadrant of right female breast: Secondary | ICD-10-CM | POA: Diagnosis not present

## 2020-07-02 DIAGNOSIS — C187 Malignant neoplasm of sigmoid colon: Secondary | ICD-10-CM

## 2020-07-02 DIAGNOSIS — Z170 Estrogen receptor positive status [ER+]: Secondary | ICD-10-CM

## 2020-07-02 NOTE — Progress Notes (Signed)
[place]   [phone] Fax:([phone]) [phone]   Clinic Follow up Note   Patient Care Team: [name], [place] as PCP - General (Nurse Practitioner) [name], MD as Consulting Physician (General Surgery) Magrinat, [name], MD as Consulting Physician (Oncology) [name], MD as Consulting Physician (Radiation Oncology) [name], RN as Registered Nurse [name], MD as Referring Physician (Dermatology) [name], RN as Oncology Nurse Navigator [name], MD as Consulting Physician (Hematology) [name], MD as Consulting Physician (General Surgery) [name], MD as Consulting Physician (Hematology)  Date of Service:  07/02/2020  CHIEF COMPLAINT: F/u of colon cancer  SUMMARY OF ONCOLOGIC HISTORY: Oncology History Overview Note  Cancer Staging Cancer of sigmoid colon [facility]) Staging form: Colon and Rectum, AJCC 8th Edition - Pathologic stage from 11/22/2019: Stage IIA (pT3, pN0, cM0) - Signed by [name], MD on 12/17/2019  Malignant neoplasm of lower-outer quadrant of right breast of female, estrogen receptor positive ([place]) Staging form: Breast, AJCC 8th Edition - Clinical: Stage IIB (cT2, cN1, cM0, G3, ER+, PR+, HER2-) - Unsigned - Pathologic: Stage IIA (pT2, pN1(sn), cM0, G3, ER+, PR+, HER2-) - Unsigned    Malignant neoplasm of lower-outer quadrant of right breast of female, estrogen receptor positive ([place])  10/23/2017 Initial Diagnosis    right breast lower outer quadrant biopsy 10/23/2017 for a clinical T2 N1, stage IIB invasive ductal carcinoma, grade 3, estrogen and progesterone receptor positive, HER-2 not amplified, with an MIB-1 of 20%   11/07/2017 Miscellaneous   Mammaprint on was read as low risk, predicting no significant chemotherapy benefit with a 5-year distant disease-free survival in the 97-98% range with hormone therapy alone   12/05/2017 Surgery   status post bilateral mastectomies on 12/05/2017  showing             (a) on the left, no evidence of malignancy             (b) on the right, a pT2 pN1, stage IIB invasive ductal carcinoma, grade 3, with negative margins. By Dr Brantley Stage   01/15/2018 - 02/28/2018 Radiation Therapy   adjuvant radiation 01/15/2018 - 02/28/2018 with Dr Lisbeth Renshaw              Site/dose: The patient initially received a dose of 50.4 Gy in 28 fractions to the chest wall and supraclavicular region. This was delivered using a 3-D conformal, 4 field technique. The patient then received a boost to the mastectomy scar. This delivered an additional 10 Gy in 5 fractions using an en face electron field. The total dose was 60.4 Gy.    04/01/2018 -  Anti-estrogen oral therapy   started anastrozole              (a) bone density 03/07/2018 with a T score of -1.8   10/28/2019 Genetic Testing   Negative genetic testing.  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  The Common Hereditary Gene Panel offered by [facility] includes sequencing and/or deletion duplication testing of the following 48 genes: APC, ATM, AXIN2, BARD1, BMPR1A, BRCA1, BRCA2, BRIP1, CDH1, CDK4, CDKN2A (p14ARF), CDKN2A (p16INK4a), CHEK2, CTNNA1, DICER1, EPCAM (Deletion/duplication testing only), GREM1 (promoter region deletion/duplication testing only), KIT, MEN1, MLH1, MSH2, MSH3, MSH6, MUTYH, NBN, NF1, NHTL1, PALB2, PDGFRA, PMS2, POLD1, POLE, PTEN, RAD50, RAD51C, RAD51D, RNF43, SDHB, SDHC, SDHD, SMAD4, SMARCA4. STK11, TP53, TSC1, TSC2, and VHL.  The following genes were evaluated for sequence changes only: SDHA and HOXB13 c.251G>A variant only. The report  date is October 28, 2019.   Cancer of sigmoid colon ([place])  09/23/2019 Tumor Marker   Baseline CEA 1.4 on 09/23/19    09/23/2019 Procedure   Colonoscopy and Upper endoscopy with Dr Alessandra Bevels 09/23/19  Upper Endoscopy Impression - Z-line regular, 35 cm from the incisors. - Small hiatal hernia. - Non-bleeding gastric ulcer. Biopsied. - Hematin  (altered blood/coffee-ground-like material) in the gastric body. - Normal duodenal bulb, first portion of the duodenum and second portion of - Normal duodenal bulb, first portion of the duodenum and second portion of the duodenum.  Colonoscopy Impression - Preparation of the colon was poor. - Perianal skin tags found on perianal exam. - Likely malignant tumor in the recto-sigmoid colon. Biopsied. Tattooed. - Internal hemorrhoids.   09/23/2019 Initial Biopsy   FINAL MICROSCOPIC DIAGNOSIS: 09/23/19 A. STOMACH, ULCER, BIOPSY:  - Gastric antral mucosa with mild nonspecific reactive gastropathy  - Warthin Starry stain is negative for Helicobacter pylori   B. COLON, RECTOSIGMOID, MASS, BIOPSY:  - Adenocarcinoma, see comment  COMMENT:   B.  Immunohistochemical stains for MMR-related proteins are pending and  will be reported in an addendum. Dr. Saralyn Pilar reviewed the case and  concurs with the diagnosis. Dr. Alessandra Bevels was notified on 09/24/2019.     ADDENDUM:  Mismatch Repair Protein (IHC)  SUMMARY INTERPRETATION: NORMAL  There is preserved expression of the major MMR proteins. There is a very  low probability that microsatellite instability (MSI) is present.  However, certain clinically significant MMR protein mutations may result  in preservation of nuclear expression. It is recommended that the  preservation of protein expression be correlated with molecular based  MSI testing.   IHC EXPRESSION RESULTS  TEST           RESULT  MLH1:          Preserved nuclear expression  MSH2:          Preserved nuclear expression  MSH6:          Preserved nuclear expression  PMS2:          Preserved nuclear expression    09/23/2019 Imaging   CT CAP W Contrast 09/23/19  IMPRESSION: 1. There is eccentric wall thickening of the rectosigmoid colon, in keeping with mass identified and biopsied by colonoscopy.   2. There are prominent subcentimeter lymph nodes in the sigmoid mesocolon, suspicious  for nodal metastatic disease. There is no other lymphadenopathy in the abdomen or pelvis.   3. There are multiple small bilateral pulmonary nodules measuring up to 4 mm, nonspecific although very suspicious for pulmonary metastatic disease. Given history of breast malignancy, previous imaging of the chest would be very helpful to assess for stability. This report may be addended for comparison if prior imaging can be obtained.   4. Status post bilateral mastectomy with irregular subpleural opacity of the anterior right upper lobe, consistent with history of breast malignancy and radiation fibrosis.   5. There is adjacent nodularity of the right pulmonary apex, the largest discrete nodular component measuring up to 1.1 cm. This is more favored infectious or inflammatory although nonspecific and metastatic disease is not excluded. As as above, prior imaging would be very helpful to assess.   6. Small focus of nonspecific infectious or inflammatory airspace disease of the perihilar left upper lobe.   09/26/2019 Initial Diagnosis   Cancer of sigmoid colon ([place])   10/28/2019 PET scan   IMPRESSION: 1. Increased tracer uptake within the pelvis corresponding to wall thickening of the rectosigmoid  colon favored to represent primary colorectal carcinoma. No findings of FDG avid nodal metastasis or solid organ metastasis within the abdomen or pelvis. 2. Within the right lung apex there is a cluster of 3 subpleural nodular densities which exhibit varying degrees of mild FDG uptake with SUV max between 1.9 and 3.1. Morphologically the appearance is suggestive of benign postinflammatory pleuroparenchymal scarring. This would be an atypical pattern of metastatic disease, which is considered less favored, but not excluded. Attention in this area on serial follow-up imaging is advised. 3. Two small, less than 5 mm left lower lobe lung nodules are too small to reliably characterize. 4.  Aortic  Atherosclerosis (ICD10-I70.0). 5. Nonobstructing right renal calculus.     11/22/2019 Cancer Staging   Staging form: Colon and Rectum, AJCC 8th Edition - Pathologic stage from 11/22/2019: Stage IIA (pT3, pN0, cM0) - Signed by [name], MD on 12/17/2019   11/22/2019 Surgery   XI ROBOT ASSISTED SIGMOIDECTOMY and FLEXIBLE SIGMOIDOSCOPY by Dr white and Dr Marcello Moores   11/22/2019 Pathology Results   SURGICAL PATHOLOGY  CASE: [id number]  PATIENT: Renelda Mom  Surgical Pathology Report      Clinical History: colon cancer      FINAL MICROSCOPIC DIAGNOSIS:   A. SIGMOID COLON, RESECTION:  - Invasive moderately differentiated adenocarcinoma, 4.5 cm, involving  distal sigmoid colon  - Carcinoma focally invades into pericolonic soft tissue  - Resection margins are negative for carcinoma  - Negative for lymphovascular or perineural invasion  - Eighteen benign lymph nodes (0/18)  - See oncology table   B. FINAL DISTAL MARGIN, EXCISION:  - Colonic donut, negative for carcinoma    04/15/2020 Imaging   CT Chest  IMPRESSION: 1. New nodular 1.9 cm focus of consolidation in the anterior right lower lobe with surrounding mild patchy tree-in-bud opacity, nonspecific but favoring infectious/inflammatory etiology. 2. Clustered nodularity in the apical right upper lobe is stable in size and newly cavitary. These findings are indeterminate for atypical infection such as MAI versus response to therapy of pulmonary metastases. Additional scattered tiny left pulmonary nodules are stable. Suggest close chest CT follow-up in 3 months. 3. No thoracic adenopathy. 4. Stable dilated main pulmonary artery, suggesting chronic pulmonary arterial hypertension. 5. Aortic Atherosclerosis (ICD10-I70.0).   04/17/2020 Tumor Marker   Her Guardant Reveal showed negative - ctDNA not detected.       CURRENT THERAPY:  Surveillance  INTERVAL HISTORY:  Sonia Sandoval is here for a follow up of colon  cancer. She presents to the clinic with her husband.  She has developed mild dyspnea on exertion lately, no cough or wheezing She has right side chest pain once a while in the past 6 months, no worse lately  No abdominal pain or bloating, no N/V or other new symptoms. Appetite is normal, weight is stable.  All other systems were reviewed with the patient and are negative.  MEDICAL HISTORY:  Past Medical History:  Diagnosis Date  . Allergies 1995   Per patient report 11/14/18  . Breast cancer ([place])   . Breast cancer, right breast ([place]) 09/2017   S/P mastectomy 12/05/2017  . Family history of breast cancer   . Headache 1968   per patient report, started as a teenager  . 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  . Osteopenia 03/2018   Seen on DEXA Scan  . PONV (postoperative nausea and vomiting)  SURGICAL HISTORY: Past Surgical History:  Procedure Laterality Date  . BACK SURGERY  2003   Patient will occassional back pain since 2003  . BIOPSY  09/23/2019   Procedure: BIOPSY;  Surgeon: Otis Brace, MD;  Location: [facility];  Service: Gastroenterology;;  EGD and COLON  . BREAST BIOPSY Right 09/2017  . [place]; 1992  . COLONOSCOPY WITH PROPOFOL N/A 09/23/2019   Procedure: COLONOSCOPY WITH PROPOFOL and EGD;  Surgeon: Otis Brace, MD;  Location: [facility];  Service: Gastroenterology;  Laterality: N/A;  . DILATION AND CURETTAGE OF UTERUS    . ESOPHAGOGASTRODUODENOSCOPY N/A 09/23/2019   Procedure: ESOPHAGOGASTRODUODENOSCOPY (EGD);  Surgeon: Otis Brace, MD;  Location: [facility];  Service: Gastroenterology;  Laterality: N/A;  . FLEXIBLE SIGMOIDOSCOPY N/A 11/22/2019   Procedure: FLEXIBLE SIGMOIDOSCOPY;  Surgeon: [name], MD;  Location: [facility];  Service: General;  Laterality: N/A;  . LUMBAR [place] SURGERY  2003  . MASTECTOMY Left  12/05/2017   PROPHYLACTIC MASTECTOMY  . MASTECTOMY COMPLETE / SIMPLE W/ SENTINEL NODE BIOPSY Right 12/05/2017   WITH RADIOACTIVE SEED TARGETED RIGHT AXIILARY LYMPH NODE EXCISION AND RIGHT SENTINEL LYMPH NODE BIOPSY,   . MASTECTOMY WITH RADIOACTIVE SEED GUIDED EXCISION AND AXILLARY SENTINEL LYMPH NODE BIOPSY Bilateral 12/05/2017   Procedure: RIGHT SIMPLE MASTECTOMY WITH RADIOACTIVE SEED TARGETED RIGHT AXIILARY LYMPH NODE EXCISION AND RIGHT SENTINEL LYMPH NODE BIOPSY, LEFT PROPHYLACTIC MASTECTOMY;  Surgeon: [name], MD;  Location: [facility];  Service: General;  Laterality: Bilateral;  . SUBMUCOSAL TATTOO INJECTION  09/23/2019   Procedure: SUBMUCOSAL TATTOO INJECTION;  Surgeon: Otis Brace, MD;  Location: [facility];  Service: Gastroenterology;;    I have reviewed the social history and family history with the patient and they are unchanged from previous note.  ALLERGIES:  is allergic to codeine.  MEDICATIONS:  Current Outpatient Medications  Medication Sig Dispense Refill  . anastrozole (ARIMIDEX) 1 MG tablet TAKE 1 TABLET BY MOUTH EVERY DAY 90 tablet 4  . calcium-vitamin D (OSCAL WITH D) 500-200 MG-UNIT tablet Take 1 tablet by mouth daily.    . Prenatal Vit-Fe Fumarate-FA (PRENATAL MULTIVITAMIN) TABS tablet Take 1 tablet by mouth daily. Gummie    . Pseudoephedrine-Naproxen Na (ALEVE-D SINUS & HEADACHE PO) Take by mouth as needed.    . Vitamin E 180 MG CAPS Take 180 mg by mouth daily.      No current facility-administered medications for this visit.    PHYSICAL EXAMINATION: ECOG PERFORMANCE STATUS: 1 - Symptomatic but completely ambulatory  Vitals:   07/02/20 1024  BP: 125/65  Pulse: 96  Resp: 20  Temp: (!) 97.5 F (36.4 C)  SpO2: 97%   Filed Weights   07/02/20 1024  Weight: 145 lb 8 oz (66 kg)    GENERAL:alert, no distress and comfortable SKIN: skin color, texture, turgor are normal, no rashes or significant lesions EYES: normal, Conjunctiva are pink and  non-injected, sclera clear NECK: supple, thyroid normal size, non-tender, without nodularity LYMPH:  no palpable lymphadenopathy in the cervical, axillary  LUNGS: clear to auscultation and percussion with normal breathing effort HEART: regular rate & rhythm and no murmurs and no lower extremity edema ABDOMEN:abdomen soft, non-tender and normal bowel sounds Musculoskeletal:no cyanosis of digits and no clubbing  NEURO: alert & oriented x 3 with fluent speech, no focal motor/sensory deficits Breasts: Status post bilateral mastectomy, well-healed surgical incisions, no significant scar tissue, no palpable nodule on chest wall, no palpable adenopathy.   LABORATORY DATA:  I have reviewed the data as  listed CBC Latest Ref Rng & Units 06/30/2020 04/15/2020 02/24/2020  WBC 4.0 - 10.5 K/uL 4.3 5.1 4.6  Hemoglobin 12.0 - 15.0 g/dL 12.7 12.4 12.4  Hematocrit 36.0 - 46.0 % 38.9 37.4 37.4  Platelets 150 - 400 K/uL 267 229 276     CMP Latest Ref Rng & Units 06/30/2020 04/15/2020 02/24/2020  Glucose 70 - 99 mg/dL 102(H) 101(H) 106(H)  BUN 8 - 23 mg/dL _0 Creatinine 0.44 - 1.00 mg/dL 0.79 0.85 0.78  Sodium 135 - 145 mmol/L 139 140 137  Potassium 3.5 - 5.1 mmol/L 4.2 4.0 4.1  Chloride 98 - 111 mmol/L 104 106 103  CO2 22 - 32 mmol/L _1 Calcium 8.9 - 10.3 mg/dL 9.7 9.5 9.3  Total Protein 6.5 - 8.1 g/dL 7.2 7.2 6.8  Total Bilirubin 0.3 - 1.2 mg/dL 0.5 0.7 0.3  Alkaline Phos 38 - 126 U/L 111 73 93  AST 15 - 41 U/L _2 ALT 0 - 44 U/L 34 31 29      RADIOGRAPHIC STUDIES: I have personally reviewed the radiological images as listed and agreed with the findings in the report. NM PET Image Restag (PS) Skull Base To Thigh  Result Date: 07/02/2020 CLINICAL DATA:  Initial treatment strategy for lung mass. History of colorectal carcinoma. EXAM: NUCLEAR MEDICINE PET SKULL BASE TO THIGH TECHNIQUE: 7.6 mCi F-18 FDG was injected intravenously. Full-ring PET imaging was performed from the skull  base to thigh after the radiotracer. CT data was obtained and used for attenuation correction and anatomic localization. Fasting blood glucose: 102 mg/dl COMPARISON:  10/28/2019 FINDINGS: Mediastinal blood pool activity: SUV max 2.62 Liver activity: SUV max NA NECK: No hypermetabolic lymph nodes in the neck. Incidental CT findings: none CHEST: -subpleural mass within the anterior right lower lobe measures 3.3 cm and has an SUV max of 10.30, image 59/8. New when compared with the PET-CT from 10/28/2019. -lateral right apex nodule measures 1 cm with SUV max of 2.11, image 22/8. On the previous PET-CT this measured 1 cm within SUV max of 2.5. -Also in the right apex is a 7 mm with SUV max of 1.28, image 21/8. Unchanged from previous PET-CT. -third nodule within the left apex appears cystic measuring 6 mm with SUV max of 2.21. Previously this measured 6 mm within SUV max of 1.7. -4 mm nodule within the lateral right lower lobe is new from previous PET-CT, image 47/8. This is below the resolution of PET. -Within the lateral left lower lobe there is a 5 mm lung nodule, image 51/8. This appears stable when compared with 09/23/2019 and remains too small to characterize by PET-CT. -Lateral left lower lobe lung nodule measures 4 mm, image 59/8. This is also stable from 09/23/2019 and is too small to characterize by PET-CT. No FDG avid mediastinal or hilar lymph nodes. Incidental CT findings: Aortic atherosclerosis. Mild cardiac enlargement. ABDOMEN/PELVIS: No abnormal FDG uptake within the liver, pancreas, spleen, or adrenal glands. No FDG avid abdominopelvic lymph nodes. Incidental CT findings: Status post resection of sigmoid colon lesion. No signs of local tumor recurrence. Mild aortic atherosclerosis. Nonobstructing right renal calculus identified. SKELETON: No focal hypermetabolic activity to suggest skeletal metastasis. Incidental CT findings: Status post bilateral mastectomy. No signs of recurrence of tumor within the  chest wall. IMPRESSION: 1. FDG avid right lower lobe lung mass is suspicious for malignancy. 2. Again seen are 3 nonspecific nodules identified within the right apex which exhibit mild FDG  uptake. The largest nodule measures 1 cm and has an SUV max of 2.1. These are stable when compared with previous PET-CT from 10/28/2019 and remain nonspecific. Continued interval surveillance of these nodules is advised 3. New 4 mm lung nodule in the right lower lobe is too small to characterize by PET-CT. Attention on follow-up imaging advised. 4. Within the left lower lobe there are 2 small lung nodules which measure up to 5 mm. These are unchanged when compared with 09/23/2019. Although these are too small to characterize by PET-CT the relative stability of these nodules favors a benign process. Continued attention on surveillance imaging is advised Electronically Signed   By: Kerby Moors M.D.   On: 07/02/2020 08:44     ASSESSMENT & PLAN:  LAQUONDA WELBY is a 70 y.o. female with   1. Lung nodules, new 3.3cm mass in RLL, suspicious for malignancy  -Her initial staging CT scan for colon cancer showed bilateral multiple small nodules measuring up to 4 mm, and heterogeneous airspace opacity of therightupper lobe, which we think is related to her previous breast radiation. -Her repeated CT Chest from 04/16/20 showed her known indeterminate right upper lobe nodularity is stable, and  Likely benign, but there is new 1.9cm consolidation in the anterior right lower lobe. radiology feels this is possible inflammation. Pt notes dry cough since middle of September, and has developed dyspnea lately  -Her PET from 3/2/222 showed hypermetabolic new 3.3 cm mass in anterior right lower lobe, enlarging from previous CT scan, highly concerning for malignancy.  Other pulmonary nodules are stable, no hypermetabolic adenopathy.  I personally reviewed the PET scan images with patient and her husband today. -I recommend a needle core  biopsy of the right lower lobe lung mass, she agrees. -We discussed this could be primary lung cancer, or metastatic disease from her previous colon or breast cancer. -Follow-up after biopsy  2. Colon Cancer,pT3N0M0, stage II -She was diagnosed in 08/2019. She is found to have malignant tumor inrecto-sigmoid and biopsy confirmedadenocarcinoma.  -She proceeded with colon surgery with Dr Dema Severin and Dr Marcello Moores on 11/22/19.  -Adjuvant chemotherapy was not recommended -GuardianReveal (circulating tumor DNA ) in December 2021 was negative  3. H/o Right Breast Cancer, G3, T2N1, ER+/PR+/HER- -She was diagnosed in 09/2017. She was treated with B/l mastectomies by Dr Brantley Stage and adjuvant radiation by Dr Lisbeth Renshaw  -She is currently on AI with Anastrozole since 04/2018.Tolerating well. -She is being followed by Dr Jana Hakim.  -Her right upper front chest pain is likely related to her previous surgery, exam was negative  4. Genetic Testingshowed VUS of MSH3 otherwise, negative for pathogenetic mutations  5. 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 I recommend she stop given she will be on chemotherapy and her overall cancer treatment. She has quit alcohol   PLAN: -PET scan reviewed, the hypermetabolic 3.3 cm mass in the right lower lobe of lung is highly suspicious for malignancy -IR lung mass biopsy -Follow-up 2 to 3 days after biopsy   No problem-specific Assessment & Plan notes found for this encounter.   Orders Placed This Encounter  Procedures  . CT Biopsy    Standing Status:   Future    Standing Expiration Date:   07/02/2021    Order Specific Question:   Lab orders requested (DO NOT place separate lab orders, these will be automatically ordered during procedure specimen collection):    Answer:   Surgical Pathology    Order Specific Question:  Reason for Exam (SYMPTOM  OR DIAGNOSIS REQUIRED)    Answer:   RLL lung mass biopsy to confirm or rule out maligancy     Order Specific Question:   Preferred location?    Answer:   [facility]    Order Specific Question:   Release to patient    Answer:   Immediate   All questions were answered. The patient knows to call the clinic with any problems, questions or concerns. No barriers to learning was detected. The total time spent in the appointment was 30 minutes.     [name], MD 07/02/2020   I, Joslyn Devon, am acting as scribe for [name], MD.   I have reviewed the above documentation for accuracy and completeness, and I agree with the above.

## 2020-07-03 ENCOUNTER — Telehealth: Payer: Self-pay | Admitting: Hematology

## 2020-07-03 ENCOUNTER — Encounter (HOSPITAL_COMMUNITY): Payer: Self-pay | Admitting: Radiology

## 2020-07-03 NOTE — Telephone Encounter (Signed)
Scheduled follow-up appointment per 3/3 los. Patient is aware. 

## 2020-07-03 NOTE — Progress Notes (Signed)
Sonia Sandoval. [facility] "[name]" Female, 70 y.o., 05-31-1950  MRN:  [id number] Phone:  [phone] Sonia Sandoval)       PCP:  [name], FNP Primary Cvg:  [employer]po  Next Appt With Radiology ([facility]-CT 3) 07/07/2020 at 11:00 AM           Message Received: Today Mir, Paula Libra, MD  Arne Cleveland, MD; [name], MD; Royston Bake, RN Cc: Garth Bigness D  The RLL lesion is amenable to CT guided biopsy.   Image 59, Series 8 of PET Scan from 07/01/20   Thank you   Mir        Previous Messages   ----- Message -----  From: Arne Cleveland, MD  Sent: 07/03/2020  7:47 AM EST  To: Paula Libra Mir, MD, Jillyn Hidden, *   Ok we can perform   CT core bx RLL lung lesion PET+   DD[facility]    ----- Message -----  From: [name], MD  Sent: 07/02/2020  8:14 PM EST  To: Arne Cleveland, MD, Paula Libra Mir, MD, *   Drs Mir and [place],   Could you do me a favor to review her recent PET and let me know if you can biopsy her RLL lung mass? If yes, please approve it. I have placed a CT guided biopsy   Santiago Glad, please schedule her biopsy ASAP and schedule an OV with me 2-3 days after biopsy   Thanks   [name]

## 2020-07-03 NOTE — Progress Notes (Signed)
Sonia Sandoval. [facility] "[name]" Female, 70 y.o., 1950/07/31  MRN:  [id number] Phone:  [phone] Jerilynn Mages)       PCP:  [name], FNP Primary Cvg:  [employer] Ppo  Next Appt With Radiology (MC-CT 3) 07/07/2020 at 11:00 AM           Message Received: Today Arne Cleveland, MD  [name], MD; Mir, Paula Libra, MD; Royston Bake, RN Cc: Arlyn Leak we can perform   CT core bx RLL lung lesion PET+   DDH        Previous Messages   ----- Message -----  From: [name], MD  Sent: 07/02/2020  8:14 PM EST  To: Arne Cleveland, MD, Paula Libra Mir, MD, *   Drs Mir and [place],   Could you do me a favor to review her recent PET and let me know if you can biopsy her RLL lung mass? If yes, please approve it. I have placed a CT guided biopsy   Santiago Glad, please schedule her biopsy ASAP and schedule an OV with me 2-3 days after biopsy   Thanks   [name]

## 2020-07-06 ENCOUNTER — Other Ambulatory Visit: Payer: Self-pay

## 2020-07-06 ENCOUNTER — Other Ambulatory Visit: Payer: Self-pay | Admitting: Radiology

## 2020-07-06 ENCOUNTER — Inpatient Hospital Stay (HOSPITAL_BASED_OUTPATIENT_CLINIC_OR_DEPARTMENT_OTHER): Payer: BC Managed Care – PPO | Admitting: Medical

## 2020-07-06 DIAGNOSIS — R059 Cough, unspecified: Secondary | ICD-10-CM

## 2020-07-06 DIAGNOSIS — C19 Malignant neoplasm of rectosigmoid junction: Secondary | ICD-10-CM | POA: Diagnosis not present

## 2020-07-06 DIAGNOSIS — C187 Malignant neoplasm of sigmoid colon: Secondary | ICD-10-CM

## 2020-07-06 DIAGNOSIS — C50511 Malignant neoplasm of lower-outer quadrant of right female breast: Secondary | ICD-10-CM

## 2020-07-06 DIAGNOSIS — Z170 Estrogen receptor positive status [ER+]: Secondary | ICD-10-CM

## 2020-07-06 LAB — SARS CORONAVIRUS 2 (TAT 6-24 HRS): SARS Coronavirus 2: NEGATIVE

## 2020-07-06 NOTE — Progress Notes (Signed)
Ms. Bechler is having a lung biopsy completed tomorrow.  She was seen today for COVID-19 testing.  A Covid 19 swab was collected and was submitted for analysis.  We will await these results.  Sandi Mealy, MHS, PA-C Physician Assistant

## 2020-07-07 ENCOUNTER — Other Ambulatory Visit: Payer: Self-pay | Admitting: Hematology

## 2020-07-07 ENCOUNTER — Encounter (HOSPITAL_COMMUNITY): Payer: Self-pay

## 2020-07-07 ENCOUNTER — Other Ambulatory Visit: Payer: Self-pay

## 2020-07-07 ENCOUNTER — Telehealth: Payer: Self-pay | Admitting: Hematology

## 2020-07-07 ENCOUNTER — Ambulatory Visit (HOSPITAL_COMMUNITY)
Admission: RE | Admit: 2020-07-07 | Discharge: 2020-07-07 | Disposition: A | Discharge status: Home / Self Care | Payer: BC Managed Care – PPO | Source: Ambulatory Visit | Attending: Hematology | Admitting: Hematology

## 2020-07-07 DIAGNOSIS — Z170 Estrogen receptor positive status [ER+]: Secondary | ICD-10-CM | POA: Diagnosis present

## 2020-07-07 DIAGNOSIS — C187 Malignant neoplasm of sigmoid colon: Secondary | ICD-10-CM

## 2020-07-07 DIAGNOSIS — C50511 Malignant neoplasm of lower-outer quadrant of right female breast: Secondary | ICD-10-CM | POA: Insufficient documentation

## 2020-07-07 LAB — CBC
HCT: 39.7 % (ref 36.0–46.0)
Hemoglobin: 12.8 g/dL (ref 12.0–15.0)
MCH: 29.8 pg (ref 26.0–34.0)
MCHC: 32.2 g/dL (ref 30.0–36.0)
MCV: 92.5 fL (ref 80.0–100.0)
Platelets: 264 10*3/uL (ref 150–400)
RBC: 4.29 MIL/uL (ref 3.87–5.11)
RDW: 12.2 % (ref 11.5–15.5)
WBC: 3.8 10*3/uL — ABNORMAL LOW (ref 4.0–10.5)
nRBC: 0 % (ref 0.0–0.2)

## 2020-07-07 LAB — PROTIME-INR
INR: 1 (ref 0.8–1.2)
Prothrombin Time: 13.2 seconds (ref 11.4–15.2)

## 2020-07-07 MED ORDER — FENTANYL CITRATE (PF) 100 MCG/2ML IJ SOLN
INTRAMUSCULAR | Status: AC
  Filled 2020-07-07: qty 2

## 2020-07-07 MED ORDER — SODIUM CHLORIDE 0.9 % IV SOLN: INTRAVENOUS | Status: DC

## 2020-07-07 MED ORDER — MIDAZOLAM HCL 2 MG/2ML IJ SOLN
INTRAMUSCULAR | Status: AC
  Filled 2020-07-07: qty 2

## 2020-07-07 NOTE — Sedation Documentation (Addendum)
Dr Pascal Lux reviewed the CT images and explained to patient that area looks smaller than previously, that he spoke to Dr Burr Medico and they decided it would be best to cancel the biopsy as the area is behaving more like infection/inflammation than a cancer. Therefore she will have a repeat scan in a few months so a reevaluation can be done then. Patient IV was d/c and got dressed. Escorted to waiting area walking, discharged accompanied  by husband.

## 2020-07-07 NOTE — H&P (Addendum)
Chief Complaint: Patient was seen in consultation today for right lung nodule biopsy at the request of Feng,Yan  Referring Physician(s): Feng,Yan  Supervising Physician: Sandi Mariscal  Patient Status: [facility] - Out-pt  History of Present Illness: Sonia Sandoval is a 70 y.o. female   Pt with Hx Breast Cancer Hx Colon cancer Follows with Dr [name]  Follow up CT scans reveal lung nodules Noted 08/2019 Re scan 04/2020 Has noticed some SOB x few weeks  And PET  07/01/20:   IMPRESSION: 1. FDG avid right lower lobe lung mass is suspicious for malignancy. 2. Again seen are 3 nonspecific nodules identified within the right apex which exhibit mild FDG uptake. The largest nodule measures 1 cm and has an SUV max of 2.1. These are stable when compared with previous PET-CT from 10/28/2019 and remain nonspecific. Continued interval surveillance of these nodules is advised 3. New 4 mm lung nodule in the right lower lobe is too small to characterize by PET-CT. Attention on follow-up imaging advised. 4. Within the left lower lobe there are 2 small lung nodules which measure up to 5 mm. These are unchanged when compared with 09/23/2019. Although these are too small to characterize by PET-CT the relative stability of these nodules favors a benign process. Continued attention on surveillance imaging is advised  Scheduled now for RLL nodule biopsy per Dr [name] request   Past Medical History:  Diagnosis Date  . Allergies 1995   Per patient report 11/14/18  . Breast cancer ([place])   . Breast cancer, right breast ([place]) 09/2017   S/P mastectomy 12/05/2017  . Family history of breast cancer   . Headache 1968   per patient report, started as a teenager  . 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  . Osteopenia 03/2018   Seen on DEXA Scan  . PONV (postoperative nausea and vomiting)      Past Surgical History:  Procedure Laterality Date  . BACK SURGERY  2003   Patient will occassional back pain since 2003  . BIOPSY  09/23/2019   Procedure: BIOPSY;  Surgeon: Otis Brace, MD;  Location: [facility];  Service: Gastroenterology;;  EGD and COLON  . BREAST BIOPSY Right 09/2017  . [place]; 1992  . COLONOSCOPY WITH PROPOFOL N/A 09/23/2019   Procedure: COLONOSCOPY WITH PROPOFOL and EGD;  Surgeon: Otis Brace, MD;  Location: [facility];  Service: Gastroenterology;  Laterality: N/A;  . DILATION AND CURETTAGE OF UTERUS    . ESOPHAGOGASTRODUODENOSCOPY N/A 09/23/2019   Procedure: ESOPHAGOGASTRODUODENOSCOPY (EGD);  Surgeon: Otis Brace, MD;  Location: [facility];  Service: Gastroenterology;  Laterality: N/A;  . FLEXIBLE SIGMOIDOSCOPY N/A 11/22/2019   Procedure: FLEXIBLE SIGMOIDOSCOPY;  Surgeon: [name], MD;  Location: [facility];  Service: General;  Laterality: N/A;  . LUMBAR Cutter SURGERY  2003  . MASTECTOMY Left 12/05/2017   PROPHYLACTIC MASTECTOMY  . MASTECTOMY COMPLETE / SIMPLE W/ SENTINEL NODE BIOPSY Right 12/05/2017   WITH RADIOACTIVE SEED TARGETED RIGHT AXIILARY LYMPH NODE EXCISION AND RIGHT SENTINEL LYMPH NODE BIOPSY,   . MASTECTOMY WITH RADIOACTIVE SEED GUIDED EXCISION AND AXILLARY SENTINEL LYMPH NODE BIOPSY Bilateral 12/05/2017   Procedure: RIGHT SIMPLE MASTECTOMY WITH RADIOACTIVE SEED TARGETED RIGHT AXIILARY LYMPH NODE EXCISION AND RIGHT SENTINEL LYMPH NODE BIOPSY, LEFT PROPHYLACTIC MASTECTOMY;  Surgeon: [name], MD;  Location: [facility];  Service: General;  Laterality: Bilateral;  .  SUBMUCOSAL TATTOO INJECTION  09/23/2019   Procedure: SUBMUCOSAL TATTOO INJECTION;  Surgeon: Otis Brace, MD;  Location: [facility];  Service: Gastroenterology;;    Allergies: Codeine  Medications: Prior to Admission medications   Medication Sig Start Date End Date Taking? Authorizing Provider  anastrozole (ARIMIDEX) 1 MG tablet TAKE 1  TABLET BY MOUTH EVERY DAY Patient taking differently: Take 1 mg by mouth daily. 03/19/20  Yes Magrinat, [name], MD  Calcium Carb-Cholecalciferol (CALCIUM 600 + D PO) Take 1 tablet by mouth daily.   Yes [provider]  cholecalciferol (VITAMIN D3) 25 MCG (1000 UNIT) tablet Take 1,000 Units by mouth daily.   Yes [provider]  Multiple Vitamins-Minerals (HAIR/SKIN/NAILS) CAPS Take 3 capsules by mouth daily.   Yes [provider]  naproxen sodium (ALEVE) 220 MG tablet Take 220 mg by mouth daily as needed (pain).   Yes [provider]  Prenatal MV & Min w/FA-DHA (PRENATAL GUMMIES PO) Take 3 tablets by mouth daily.   Yes [provider]  Pseudoephedrine-Naproxen Na (ALEVE-D SINUS & HEADACHE PO) Take 1 tablet by mouth daily as needed (allergies).   Yes [provider]  vitamin E 180 MG (400 UNITS) capsule Take 400 Units by mouth daily.   Yes [provider]     Family History  Problem Relation Age of Onset  . Dementia Mother   . Lung cancer Father   . Cancer Maternal Aunt 60       breast cancer   . Cancer Cousin        maternal first cousin with breast cancer in her 31s  . Heart disease Maternal Grandfather     Social History   Socioeconomic History  . Marital status: Married    Spouse name: Not on file  . Number of children: 4  . Years of education: Not on file  . Highest education level: Not on file  Occupational History  . Not on file  Tobacco Use  . Smoking status: Never Smoker  . Smokeless tobacco: Never Used  Vaping Use  . Vaping Use: Never used  Substance and Sexual Activity  . Alcohol use: Yes    Alcohol/week: 14.0 standard drinks    Types: 14 Glasses of wine per week  . Drug use: Never  . Sexual activity: Not on file  Other Topics Concern  . Not on file  Social History Narrative   12-19-17 Unable to ask abuse questions husband with her today.   Social Determinants of Health   Financial Resource  Strain: Not on file  Food Insecurity: Not on file  Transportation Needs: Not on file  Physical Activity: Not on file  Stress: Not on file  Social Connections: Not on file    Review of Systems: A 12 point ROS discussed and pertinent positives are indicated in the HPI above.  All other systems are negative.  Review of Systems  Constitutional: Negative for activity change, fatigue and fever.  Respiratory: Negative for cough and shortness of breath.   Cardiovascular: Negative for chest pain.  Gastrointestinal: Negative for abdominal pain and nausea.  Musculoskeletal: Negative for back pain.  Psychiatric/Behavioral: Negative for behavioral problems and confusion.    Vital Signs: BP 127/71   Pulse 75   Temp 97.9 F (36.6 C) (Oral)   Ht 5\' 3"  (1.6 m)   Wt 139 lb (63 kg)   SpO2 100%   BMI 24.62 kg/m   Physical Exam Vitals reviewed.  HENT:     Mouth/Throat:  Mouth: Mucous membranes are moist.  Cardiovascular:     Rate and Rhythm: Normal rate and regular rhythm.     Heart sounds: Normal heart sounds.  Pulmonary:     Effort: Pulmonary effort is normal.     Breath sounds: Normal breath sounds.  Abdominal:     Palpations: Abdomen is soft.  Musculoskeletal:        General: Normal range of motion.  Skin:    General: Skin is warm.  Neurological:     Mental Status: She is alert and oriented to person, place, and time.  Psychiatric:        Behavior: Behavior normal.     Imaging: NM PET Image Restag (PS) Skull Base To Thigh  Result Date: 07/02/2020 CLINICAL DATA:  Initial treatment strategy for lung mass. History of colorectal carcinoma. EXAM: NUCLEAR MEDICINE PET SKULL BASE TO THIGH TECHNIQUE: 7.6 mCi F-18 FDG was injected intravenously. Full-ring PET imaging was performed from the skull base to thigh after the radiotracer. CT data was obtained and used for attenuation correction and anatomic localization. Fasting blood glucose: 102 mg/dl COMPARISON:  10/28/2019 FINDINGS:  Mediastinal blood pool activity: SUV max 2.62 Liver activity: SUV max NA NECK: No hypermetabolic lymph nodes in the neck. Incidental CT findings: none CHEST: -subpleural mass within the anterior right lower lobe measures 3.3 cm and has an SUV max of 10.30, image 59/8. New when compared with the PET-CT from 10/28/2019. -lateral right apex nodule measures 1 cm with SUV max of 2.11, image 22/8. On the previous PET-CT this measured 1 cm within SUV max of 2.5. -Also in the right apex is a 7 mm with SUV max of 1.28, image 21/8. Unchanged from previous PET-CT. -third nodule within the left apex appears cystic measuring 6 mm with SUV max of 2.21. Previously this measured 6 mm within SUV max of 1.7. -4 mm nodule within the lateral right lower lobe is new from previous PET-CT, image 47/8. This is below the resolution of PET. -Within the lateral left lower lobe there is a 5 mm lung nodule, image 51/8. This appears stable when compared with 09/23/2019 and remains too small to characterize by PET-CT. -Lateral left lower lobe lung nodule measures 4 mm, image 59/8. This is also stable from 09/23/2019 and is too small to characterize by PET-CT. No FDG avid mediastinal or hilar lymph nodes. Incidental CT findings: Aortic atherosclerosis. Mild cardiac enlargement. ABDOMEN/PELVIS: No abnormal FDG uptake within the liver, pancreas, spleen, or adrenal glands. No FDG avid abdominopelvic lymph nodes. Incidental CT findings: Status post resection of sigmoid colon lesion. No signs of local tumor recurrence. Mild aortic atherosclerosis. Nonobstructing right renal calculus identified. SKELETON: No focal hypermetabolic activity to suggest skeletal metastasis. Incidental CT findings: Status post bilateral mastectomy. No signs of recurrence of tumor within the chest wall. IMPRESSION: 1. FDG avid right lower lobe lung mass is suspicious for malignancy. 2. Again seen are 3 nonspecific nodules identified within the right apex which exhibit mild FDG  uptake. The largest nodule measures 1 cm and has an SUV max of 2.1. These are stable when compared with previous PET-CT from 10/28/2019 and remain nonspecific. Continued interval surveillance of these nodules is advised 3. New 4 mm lung nodule in the right lower lobe is too small to characterize by PET-CT. Attention on follow-up imaging advised. 4. Within the left lower lobe there are 2 small lung nodules which measure up to 5 mm. These are unchanged when compared with 09/23/2019. Although these are too  small to characterize by PET-CT the relative stability of these nodules favors a benign process. Continued attention on surveillance imaging is advised Electronically Signed   By: Kerby Moors M.D.   On: 07/02/2020 08:44    Labs:  CBC: Recent Labs    12/18/19 1026 02/24/20 1129 04/15/20 1203 06/30/20 0943  WBC 2.9* 4.6 5.1 4.3  HGB 13.1 12.4 12.4 12.7  HCT 39.2 37.4 37.4 38.9  PLT 193 276 229 267    COAGS: Recent Labs    11/11/19 1053  INR 1.0    BMP: Recent Labs    11/11/19 1053 11/23/19 0634 11/24/19 0615 12/18/19 1026 02/24/20 1129 04/15/20 1203 06/30/20 0943  NA 140 134* 141 138 137 140 139  K 4.5 3.7 3.4* 4.2 4.1 4.0 4.2  CL 106 100 107 106 103 106 104  CO2 26 23 26 25 28 28 25   GLUCOSE 107* 111* 97 98 106* 101* 102*  BUN 17 8 7* 14 20 20 17   CALCIUM 9.3 9.0 8.8* 9.7 9.3 9.5 9.7  CREATININE 0.66 0.63 0.61 0.78 0.78 0.85 0.79  GFRNONAA >60 >60 >60 >60 >60 >60 >60  GFRAA >60 >60 >60 >60  --   --   --     LIVER FUNCTION TESTS: Recent Labs    12/18/19 1026 02/24/20 1129 04/15/20 1203 06/30/20 0943  BILITOT 0.4 0.3 0.7 0.5  AST 24 24 30 23   ALT 19 29 31  34  ALKPHOS 61 93 73 111  PROT 6.8 6.8 7.2 7.2  ALBUMIN 4.1 3.9 4.2 3.9    TUMOR MARKERS: No results for input(s): AFPTM, CEA, CA199, CHROMGRNA in the last 8760 hours.  Assessment and Plan:  Pt with known Cancer- Breast and Colon Follow up scans revealing lung nodules +PET 07/01/20: RLL nodule Now  for biopsy per Dr [name] Risks and benefits of CT guided lung nodule biopsy was discussed with the patient including, but not limited to bleeding, hemoptysis, respiratory failure requiring intubation, infection, pneumothorax requiring chest tube placement, stroke from air embolism or even death.  All of the patient's questions were answered and the patient is agreeable to proceed. Consent signed and in chart.   Thank you for this interesting consult.  I greatly enjoyed meeting SELENIA MIHOK and look forward to participating in their care.  A copy of this report was sent to the requesting provider on this date.  Electronically Signed: Lavonia Drafts, PA-C 07/07/2020, 9:23 AM   I spent a total of  30 Minutes   in face to face in clinical consultation, greater than 50% of which was counseling/coordinating care for right lung nodule bx

## 2020-07-07 NOTE — Telephone Encounter (Signed)
I got a call from IR Dr. Pascal Lux this morning regarding her lung nodule biopsy. Dr. Pascal Lux noticed her RLL 3.3cm nodule has decreased in size on today's CT, which supports inflammation over malignancy, and it's in a difficult position for biopsy. He recommends f/u with CT scan and cancel her biopsy today. I will order a follow up CT in 2 months and see her back after CT.   Sonia Sandoval  07/07/2020

## 2020-07-07 NOTE — Procedures (Signed)
Pre procedural Dx: Indeterminate right upper lobe consolidation Post procedural Dx: Same  Slight reduction in size of hypermetabolic right upper lobe pulmonary nodule, nonspecific though favored to represent area of infection/inflammation as was initially suggested on chest CT performed 04/15/2020.    Above discussed with referring Oncologist, Dr. Burr Medico and decision was made NOT to undergo attempted biopsy at this time.  PLAN:  Patient will undergo surveillance chest CT in 2 months with subsequent management as per Dr. Burr Medico.  Above was discussed at great length with the patient who demonstrated excellent understanding of the proposed plan of care.    Ronny Bacon, MD Pager #: [phone]

## 2020-07-08 NOTE — Progress Notes (Incomplete)
Sonia Sandoval   [phone] Fax:([phone]) [phone]   Clinic Follow up Note   Patient Care Team: [name], Bent as PCP - General (Nurse Practitioner) [name], MD as Consulting Physician (General Surgery) Magrinat, [name], MD as Consulting Physician (Oncology) [name], MD as Consulting Physician (Radiation Oncology) [name], RN as Registered Nurse [name], MD as Referring Physician (Dermatology) [name], RN as Oncology Nurse Navigator [name], MD as Consulting Physician (Hematology) [name], MD as Consulting Physician (General Surgery) [name], MD as Consulting Physician (Hematology)  Date of Service:  07/08/2020  CHIEF COMPLAINT: F/u of colon cancer  SUMMARY OF ONCOLOGIC HISTORY: Oncology History Overview Note  Cancer Staging Cancer of sigmoid colon [facility] Staging form: Colon and Rectum, AJCC 8th Edition - Pathologic stage from 11/22/2019: Stage IIA (pT3, pN0, cM0) - Signed by [name], MD on 12/17/2019  Malignant neoplasm of lower-outer quadrant of right breast of female, estrogen receptor positive ([place]) Staging form: Breast, AJCC 8th Edition - Clinical: Stage IIB (cT2, cN1, cM0, G3, ER+, PR+, HER2-) - Unsigned - Pathologic: Stage IIA (pT2, pN1(sn), cM0, G3, ER+, PR+, HER2-) - Unsigned    Malignant neoplasm of lower-outer quadrant of right breast of female, estrogen receptor positive ([place])  10/23/2017 Initial Diagnosis    right breast lower outer quadrant biopsy 10/23/2017 for a clinical T2 N1, stage IIB invasive ductal carcinoma, grade 3, estrogen and progesterone receptor positive, HER-2 not amplified, with an MIB-1 of 20%   11/07/2017 Miscellaneous   Mammaprint on was read as low risk, predicting no significant chemotherapy benefit with a 5-year distant disease-free survival in the 97-98% range with hormone therapy alone   12/05/2017 Surgery   status post bilateral mastectomies on 12/05/2017  showing             (a) on the left, no evidence of malignancy             (b) on the right, a pT2 pN1, stage IIB invasive ductal carcinoma, grade 3, with negative margins. By Dr Brantley Stage   01/15/2018 - 02/28/2018 Radiation Therapy   adjuvant radiation 01/15/2018 - 02/28/2018 with Dr Lisbeth Renshaw              Site/dose: The patient initially received a dose of 50.4 Gy in 28 fractions to the chest wall and supraclavicular region. This was delivered using a 3-D conformal, 4 field technique. The patient then received a boost to the mastectomy scar. This delivered an additional 10 Gy in 5 fractions using an en face electron field. The total dose was 60.4 Gy.    04/01/2018 -  Anti-estrogen oral therapy   started anastrozole              (a) bone density 03/07/2018 with a T score of -1.8   10/28/2019 Genetic Testing   Negative genetic testing.  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  The Common Hereditary Gene Panel offered by [facility] includes sequencing and/or deletion duplication testing of the following 48 genes: APC, ATM, AXIN2, BARD1, BMPR1A, BRCA1, BRCA2, BRIP1, CDH1, CDK4, CDKN2A (p14ARF), CDKN2A (p16INK4a), CHEK2, CTNNA1, DICER1, EPCAM (Deletion/duplication testing only), GREM1 (promoter region deletion/duplication testing only), KIT, MEN1, MLH1, MSH2, MSH3, MSH6, MUTYH, NBN, NF1, NHTL1, PALB2, PDGFRA, PMS2, POLD1, POLE, PTEN, RAD50, RAD51C, RAD51D, RNF43, SDHB, SDHC, SDHD, SMAD4, SMARCA4. STK11, TP53, TSC1, TSC2, and VHL.  The following genes were evaluated for sequence changes only: SDHA and HOXB13 c.251G>A variant only. The report  date is October 28, 2019.   Cancer of sigmoid colon ([place])  09/23/2019 Tumor Marker   Baseline CEA 1.4 on 09/23/19    09/23/2019 Procedure   Colonoscopy and Upper endoscopy with Dr Alessandra Bevels 09/23/19  Upper Endoscopy Impression - Z-line regular, 35 cm from the incisors. - Small hiatal hernia. - Non-bleeding gastric ulcer. Biopsied. - Hematin  (altered blood/coffee-ground-like material) in the gastric body. - Normal duodenal bulb, first portion of the duodenum and second portion of - Normal duodenal bulb, first portion of the duodenum and second portion of the duodenum.  Colonoscopy Impression - Preparation of the colon was poor. - Perianal skin tags found on perianal exam. - Likely malignant tumor in the recto-sigmoid colon. Biopsied. Tattooed. - Internal hemorrhoids.   09/23/2019 Initial Biopsy   FINAL MICROSCOPIC DIAGNOSIS: 09/23/19 A. STOMACH, ULCER, BIOPSY:  - Gastric antral mucosa with mild nonspecific reactive gastropathy  - Warthin Starry stain is negative for Helicobacter pylori   B. COLON, RECTOSIGMOID, MASS, BIOPSY:  - Adenocarcinoma, see comment  COMMENT:   B.  Immunohistochemical stains for MMR-related proteins are pending and  will be reported in an addendum. Dr. Saralyn Pilar reviewed the case and  concurs with the diagnosis. Dr. Alessandra Bevels was notified on 09/24/2019.     ADDENDUM:  Mismatch Repair Protein (IHC)  SUMMARY INTERPRETATION: NORMAL  There is preserved expression of the major MMR proteins. There is a very  low probability that microsatellite instability (MSI) is present.  However, certain clinically significant MMR protein mutations may result  in preservation of nuclear expression. It is recommended that the  preservation of protein expression be correlated with molecular based  MSI testing.   IHC EXPRESSION RESULTS  TEST           RESULT  MLH1:          Preserved nuclear expression  MSH2:          Preserved nuclear expression  MSH6:          Preserved nuclear expression  PMS2:          Preserved nuclear expression    09/23/2019 Imaging   CT CAP W Contrast 09/23/19  IMPRESSION: 1. There is eccentric wall thickening of the rectosigmoid colon, in keeping with mass identified and biopsied by colonoscopy.   2. There are prominent subcentimeter lymph nodes in the sigmoid mesocolon, suspicious  for nodal metastatic disease. There is no other lymphadenopathy in the abdomen or pelvis.   3. There are multiple small bilateral pulmonary nodules measuring up to 4 mm, nonspecific although very suspicious for pulmonary metastatic disease. Given history of breast malignancy, previous imaging of the chest would be very helpful to assess for stability. This report may be addended for comparison if prior imaging can be obtained.   4. Status post bilateral mastectomy with irregular subpleural opacity of the anterior right upper lobe, consistent with history of breast malignancy and radiation fibrosis.   5. There is adjacent nodularity of the right pulmonary apex, the largest discrete nodular component measuring up to 1.1 cm. This is more favored infectious or inflammatory although nonspecific and metastatic disease is not excluded. As as above, prior imaging would be very helpful to assess.   6. Small focus of nonspecific infectious or inflammatory airspace disease of the perihilar left upper lobe.   09/26/2019 Initial Diagnosis   Cancer of sigmoid colon ([place])   10/28/2019 PET scan   IMPRESSION: 1. Increased tracer uptake within the pelvis corresponding to wall thickening of the rectosigmoid  colon favored to represent primary colorectal carcinoma. No findings of FDG avid nodal metastasis or solid organ metastasis within the abdomen or pelvis. 2. Within the right lung apex there is a cluster of 3 subpleural nodular densities which exhibit varying degrees of mild FDG uptake with SUV max between 1.9 and 3.1. Morphologically the appearance is suggestive of benign postinflammatory pleuroparenchymal scarring. This would be an atypical pattern of metastatic disease, which is considered less favored, but not excluded. Attention in this area on serial follow-up imaging is advised. 3. Two small, less than 5 mm left lower lobe lung nodules are too small to reliably characterize. 4.  Aortic  Atherosclerosis (ICD10-I70.0). 5. Nonobstructing right renal calculus.     11/22/2019 Cancer Staging   Staging form: Colon and Rectum, AJCC 8th Edition - Pathologic stage from 11/22/2019: Stage IIA (pT3, pN0, cM0) - Signed by [name], MD on 12/17/2019   11/22/2019 Surgery   XI ROBOT ASSISTED SIGMOIDECTOMY and FLEXIBLE SIGMOIDOSCOPY by Dr white and Dr Marcello Moores   11/22/2019 Pathology Results   SURGICAL PATHOLOGY  CASE: [id number]  PATIENT: Sonia Sandoval  Surgical Pathology Report      Clinical History: colon cancer      FINAL MICROSCOPIC DIAGNOSIS:   A. SIGMOID COLON, RESECTION:  - Invasive moderately differentiated adenocarcinoma, 4.5 cm, involving  distal sigmoid colon  - Carcinoma focally invades into pericolonic soft tissue  - Resection margins are negative for carcinoma  - Negative for lymphovascular or perineural invasion  - Eighteen benign lymph nodes (0/18)  - See oncology table   B. FINAL DISTAL MARGIN, EXCISION:  - Colonic donut, negative for carcinoma    04/15/2020 Imaging   CT Chest  IMPRESSION: 1. New nodular 1.9 cm focus of consolidation in the anterior right lower lobe with surrounding mild patchy tree-in-bud opacity, nonspecific but favoring infectious/inflammatory etiology. 2. Clustered nodularity in the apical right upper lobe is stable in size and newly cavitary. These findings are indeterminate for atypical infection such as MAI versus response to therapy of pulmonary metastases. Additional scattered tiny left pulmonary nodules are stable. Suggest close chest CT follow-up in 3 months. 3. No thoracic adenopathy. 4. Stable dilated main pulmonary artery, suggesting chronic pulmonary arterial hypertension. 5. Aortic Atherosclerosis (ICD10-I70.0).   04/17/2020 Tumor Marker   Her Guardant Reveal showed negative - ctDNA not detected.       CURRENT THERAPY:  Surveillance  INTERVAL HISTORY: *** Sonia Sandoval is here for a follow up of colon  cancer. She presents to the clinic alone.    REVIEW OF SYSTEMS:  *** Constitutional: Denies fevers, chills or abnormal weight loss Eyes: Denies blurriness of vision Ears, nose, mouth, throat, and face: Denies mucositis or sore throat Respiratory: Denies cough, dyspnea or wheezes Cardiovascular: Denies palpitation, chest discomfort or lower extremity swelling Gastrointestinal:  Denies nausea, heartburn or change in bowel habits Skin: Denies abnormal skin rashes Lymphatics: Denies new lymphadenopathy or easy bruising Neurological:Denies numbness, tingling or new weaknesses Behavioral/Psych: Mood is stable, no new changes  All other systems were reviewed with the patient and are negative.  MEDICAL HISTORY:  Past Medical History:  Diagnosis Date  . Allergies 1995   Per patient report 11/14/18  . Breast cancer ([place])   . Breast cancer, right breast ([place]) 09/2017   S/P mastectomy 12/05/2017  . Family history of breast cancer   . Headache 1968   per patient report, started as a teenager  . 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  . Osteopenia 03/2018   Seen on DEXA Scan  . PONV (postoperative nausea and vomiting)     SURGICAL HISTORY: Past Surgical History:  Procedure Laterality Date  . BACK SURGERY  2003   Patient will occassional back pain since 2003  . BIOPSY  09/23/2019   Procedure: BIOPSY;  Surgeon: Otis Brace, MD;  Location: [facility];  Service: Gastroenterology;;  EGD and COLON  . BREAST BIOPSY Right 09/2017  . Economy; 1992  . COLONOSCOPY WITH PROPOFOL N/A 09/23/2019   Procedure: COLONOSCOPY WITH PROPOFOL and EGD;  Surgeon: Otis Brace, MD;  Location: [facility];  Service: Gastroenterology;  Laterality: N/A;  . DILATION AND CURETTAGE OF UTERUS    . ESOPHAGOGASTRODUODENOSCOPY N/A 09/23/2019   Procedure: ESOPHAGOGASTRODUODENOSCOPY  (EGD);  Surgeon: Otis Brace, MD;  Location: [facility];  Service: Gastroenterology;  Laterality: N/A;  . FLEXIBLE SIGMOIDOSCOPY N/A 11/22/2019   Procedure: FLEXIBLE SIGMOIDOSCOPY;  Surgeon: [name], MD;  Location: [facility];  Service: General;  Laterality: N/A;  . LUMBAR [place] SURGERY  2003  . MASTECTOMY Left 12/05/2017   PROPHYLACTIC MASTECTOMY  . MASTECTOMY COMPLETE / SIMPLE W/ SENTINEL NODE BIOPSY Right 12/05/2017   WITH RADIOACTIVE SEED TARGETED RIGHT AXIILARY LYMPH NODE EXCISION AND RIGHT SENTINEL LYMPH NODE BIOPSY,   . MASTECTOMY WITH RADIOACTIVE SEED GUIDED EXCISION AND AXILLARY SENTINEL LYMPH NODE BIOPSY Bilateral 12/05/2017   Procedure: RIGHT SIMPLE MASTECTOMY WITH RADIOACTIVE SEED TARGETED RIGHT AXIILARY LYMPH NODE EXCISION AND RIGHT SENTINEL LYMPH NODE BIOPSY, LEFT PROPHYLACTIC MASTECTOMY;  Surgeon: [name], MD;  Location: [place];  Service: General;  Laterality: Bilateral;  . SUBMUCOSAL TATTOO INJECTION  09/23/2019   Procedure: SUBMUCOSAL TATTOO INJECTION;  Surgeon: Otis Brace, MD;  Location: [facility];  Service: Gastroenterology;;    I have reviewed the social history and family history with the patient and they are unchanged from previous note.  ALLERGIES:  is allergic to codeine.  MEDICATIONS:  Current Outpatient Medications  Medication Sig Dispense Refill  . anastrozole (ARIMIDEX) 1 MG tablet TAKE 1 TABLET BY MOUTH EVERY DAY (Patient taking differently: Take 1 mg by mouth daily.) 90 tablet 4  . Calcium Carb-Cholecalciferol (CALCIUM 600 + D PO) Take 1 tablet by mouth daily.    . cholecalciferol (VITAMIN D3) 25 MCG (1000 UNIT) tablet Take 1,000 Units by mouth daily.    . Multiple Vitamins-Minerals (HAIR/SKIN/NAILS) CAPS Take 3 capsules by mouth daily.    . naproxen sodium (ALEVE) 220 MG tablet Take 220 mg by mouth daily as needed (pain).    . Prenatal MV & Min w/FA-DHA (PRENATAL GUMMIES PO) Take 3 tablets by mouth daily.    .  Pseudoephedrine-Naproxen Na (ALEVE-D SINUS & HEADACHE PO) Take 1 tablet by mouth daily as needed (allergies).    . vitamin E 180 MG (400 UNITS) capsule Take 400 Units by mouth daily.     No current facility-administered medications for this visit.    PHYSICAL EXAMINATION: ECOG PERFORMANCE STATUS: {CHL ONC ECOG [phone]}  There were no vitals filed for this visit. There were no vitals filed for this visit. *** GENERAL:alert, no distress and comfortable SKIN: skin color, texture, turgor are normal, no rashes or significant lesions EYES: normal, Conjunctiva are pink and non-injected, sclera clear {OROPHARYNX:no exudate, no erythema and lips, buccal mucosa, and tongue normal}  NECK: supple, thyroid normal size, non-tender, without nodularity LYMPH:  no palpable lymphadenopathy in the cervical, axillary {or  inguinal} LUNGS: clear to auscultation and percussion with normal breathing effort HEART: regular rate & rhythm and no murmurs and no lower extremity edema ABDOMEN:abdomen soft, non-tender and normal bowel sounds Musculoskeletal:no cyanosis of digits and no clubbing  NEURO: alert & oriented x 3 with fluent speech, no focal motor/sensory deficits  LABORATORY DATA:  I have reviewed the data as listed CBC Latest Ref Rng & Units 07/07/2020 06/30/2020 04/15/2020  WBC 4.0 - 10.5 K/uL 3.8(L) 4.3 5.1  Hemoglobin 12.0 - 15.0 g/dL 12.8 12.7 12.4  Hematocrit 36.0 - 46.0 % 39.7 38.9 37.4  Platelets 150 - 400 K/uL 264 267 229     CMP Latest Ref Rng & Units 06/30/2020 04/15/2020 02/24/2020  Glucose 70 - 99 mg/dL 102(H) 101(H) 106(H)  BUN 8 - 23 mg/dL $Remove'17 20 20  'SiFRQLl$ Creatinine 0.44 - 1.00 mg/dL 0.79 0.85 0.78  Sodium 135 - 145 mmol/L 139 140 137  Potassium 3.5 - 5.1 mmol/L 4.2 4.0 4.1  Chloride 98 - 111 mmol/L 104 106 103  CO2 22 - 32 mmol/L $RemoveB'25 28 28  'nLNXdint$ Calcium 8.9 - 10.3 mg/dL 9.7 9.5 9.3  Total Protein 6.5 - 8.1 g/dL 7.2 7.2 6.8  Total Bilirubin 0.3 - 1.2 mg/dL 0.5 0.7 0.3  Alkaline Phos 38 -  126 U/L 111 73 93  AST 15 - 41 U/L $Remo'23 30 24  'hjIEj$ ALT 0 - 44 U/L 34 31 29      RADIOGRAPHIC STUDIES: I have personally reviewed the radiological images as listed and agreed with the findings in the report. CT CHEST LIMITED WO CONTRAST  Result Date: 07/07/2020 INDICATION: History of colon cancer, now with hypermetabolic right upper lobe pulmonary nodule. Please perform CT-guided biopsy for tissue diagnostic purposes. EXAM: LIMITED CHEST CT COMPARISON:  PET-CT-07/01/2020; 10/28/2019; chest CT-04/15/2020 MEDICATIONS: None. ANESTHESIA/SEDATION: None CONTRAST:  None COMPLICATIONS: None immediate. PROCEDURE: Informed consent was obtained from the patient following an explanation of the procedure, risks, benefits and alternatives. The patient understands,agrees and consents for the procedure. All questions were addressed. A time out was performed prior to the initiation of the procedure. The patient was positioned supine, slightly LPO on the CT table and a limited chest CT was performed for procedural planning demonstrating slight reduction in size of the consolidative opacity involving the anterior aspect of the right upper lobe presently measuring 3.3 x 1.5 cm (image 25, series 3), previously, 3.5 x 1.8 cm when compared to PET-CT performed 07/01/2020. As the lesion appears slightly smaller then recent PET-CT and initially appear to represent an area of infection/inflammation on chest CT performed 04/15/2020, the appropriateness of proceeding with attempted CT-guided biopsy with referring oncologist, Dr. Burr Medico, especially as the opacity abuts the anterior aspect of the right major fissure rendering percutaneous sampling otherwise more challenging, and the decision was made to obtain a noncontrast chest CT in 2 months rather than pursuing CT-guided biopsy at this time. No biopsy attempted. IMPRESSION: Slight reduction in size of hypermetabolic right upper lobe pulmonary nodule, nonspecific though favored to represent  area of infection/inflammation as was initially suggested on chest CT performed 04/15/2020. No biopsy attempted at this time. PLAN: Patient will undergo surveillance chest CT in 2 months with subsequent management as per Dr. Burr Medico. Above was discussed at great length with the patient who demonstrated excellent understanding of the proposed plan of care. Electronically Signed   By: Sandi Mariscal M.D.   On: 07/07/2020 12:59     ASSESSMENT & PLAN:  Sonia Sandoval is a 70 y.o. female  with   1. Lung nodules, new 3.3cm mass in RLL, suspicious for malignancy  -Her initial staging CT scan for colon cancer showed bilateral multiple small nodules measuring up to 4 mm, and heterogeneous airspace opacity of therightupper lobe, which we think is related to her previous breast radiation. -Her repeated CT Chest from 04/16/20 showedher known indeterminate right upper lobe nodularity is stable, and Likely benign, but there is new 1.9cm consolidation in the anterior right lower lobe.radiologyfeels this is possible inflammation. Pt notes dry cough since middle of September, and has developed dyspnea lately  -Her PET from 3/2/222 showed hypermetabolic new 3.3 cm mass in anterior right lower lobe, enlarging from previous CT scan, highly concerning for malignancy.  Other pulmonary nodules are stable, no hypermetabolic adenopathy. I personally reviewed the PET scan images with patient and her husband today. -I recommend a needle core biopsy of the right lower lobe lung mass, she agrees. -We discussed this could be primary lung cancer, or metastatic disease from her previous colon or breast cancer. -Follow-up after biopsy  2. Colon Cancer,pT3N0M0, stage II -She was diagnosed in 08/2019. She is found to have malignant tumor inrecto-sigmoid and biopsy confirmedadenocarcinoma.  -She proceeded with colon surgery with Dr Dema Severin and Dr Marcello Moores on 11/22/19.  -Adjuvant chemotherapy was not recommended -GuardianReveal  (circulating tumor DNA ) in December 2021 was negative  3. H/o Right Breast Cancer, G3, T2N1, ER+/PR+/HER- -She was diagnosed in 09/2017. She was treated with B/l mastectomies by Dr Brantley Stage and adjuvant radiation by Dr Lisbeth Renshaw  -She is currently on AI with Anastrozole since 04/2018.Tolerating well. -She is being followed by Dr Jana Hakim.  -Her right upper front chest pain is likely related to her previous surgery, exam was negative  4. Genetic Testingshowed VUS of MSH3 otherwise, negative for pathogenetic mutations  5. 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 I recommend she stop given she will be on chemotherapy and her overall cancer treatment. She has quit alcohol   PLAN: -PET scan reviewed, the hypermetabolic 3.3 cm mass in the right lower lobe of lung is highly suspicious for malignancy -IR lung mass biopsy -Follow-up 2 to 3 days after biopsy   No problem-specific Assessment & Plan notes found for this encounter.   No orders of the defined types were placed in this encounter.  All questions were answered. The patient knows to call the clinic with any problems, questions or concerns. No barriers to learning was detected. The total time spent in the appointment was {CHL ONC TIME VISIT - FFMBW:[id number]}.     Joslyn Devon 07/08/2020   Oneal Deputy, am acting as scribe for [name], MD.   {Add scribe attestation statement}

## 2020-07-10 ENCOUNTER — Ambulatory Visit: Payer: Medicare Other | Admitting: Hematology

## 2020-08-18 ENCOUNTER — Telehealth: Payer: Self-pay | Admitting: Hematology

## 2020-08-18 NOTE — Telephone Encounter (Signed)
Scheduled appt per 4/18 sch msg. Pt aware.

## 2020-08-28 NOTE — Progress Notes (Signed)
[place]   [phone] [phone]   Clinic Follow up Note   Patient Care Team: [name], [place] as PCP - General (Nurse Practitioner) [name], MD as Consulting Physician (General Surgery) Magrinat, [name], MD as Consulting Physician (Oncology) [name], MD as Consulting Physician (Radiation Oncology) [name], RN as Registered Nurse [name], MD as Referring Physician (Dermatology) [name], RN as Oncology Nurse Navigator [name], MD as Consulting Physician (Hematology) [name], MD as Consulting Physician (General Surgery) [name], MD as Consulting Physician (Hematology)  Date of Service:  09/02/2020  CHIEF COMPLAINT: F/u of colon cancer  SUMMARY OF ONCOLOGIC HISTORY: Oncology History Overview Note  Cancer Staging Cancer of sigmoid colon [facility]) Staging form: Colon and Rectum, AJCC 8th Edition - Pathologic stage from 11/22/2019: Stage IIA (pT3, pN0, cM0) - Signed by [name], MD on 12/17/2019  Malignant neoplasm of lower-outer quadrant of right breast of female, estrogen receptor positive ([place]) Staging form: Breast, AJCC 8th Edition - Clinical: Stage IIB (cT2, cN1, cM0, G3, ER+, PR+, HER2-) - Unsigned - Pathologic: Stage IIA (pT2, pN1(sn), cM0, G3, ER+, PR+, HER2-) - Unsigned    Malignant neoplasm of lower-outer quadrant of right breast of female, estrogen receptor positive ([place])  10/23/2017 Initial Diagnosis    right breast lower outer quadrant biopsy 10/23/2017 for a clinical T2 N1, stage IIB invasive ductal carcinoma, grade 3, estrogen and progesterone receptor positive, HER-2 not amplified, with an MIB-1 of 20%   11/07/2017 Miscellaneous   Mammaprint on was read as low risk, predicting no significant chemotherapy benefit with a 5-year distant disease-free survival in the 97-98% range with hormone therapy alone   12/05/2017 Surgery   status post bilateral mastectomies on 12/05/2017  showing             (a) on the left, no evidence of malignancy             (b) on the right, a pT2 pN1, stage IIB invasive ductal carcinoma, grade 3, with negative margins. By Dr Brantley Stage   01/15/2018 - 02/28/2018 Radiation Therapy   adjuvant radiation 01/15/2018 - 02/28/2018 with Dr Lisbeth Renshaw              Site/dose: The patient initially received a dose of 50.4 Gy in 28 fractions to the chest wall and supraclavicular region. This was delivered using a 3-D conformal, 4 field technique. The patient then received a boost to the mastectomy scar. This delivered an additional 10 Gy in 5 fractions using an en face electron field. The total dose was 60.4 Gy.    04/01/2018 -  Anti-estrogen oral therapy   started anastrozole in 04/2018. Switched to Exemestane in 08/2020 due to joint pain.             (a) bone density 03/07/2018 with a T score of -1.8. Her 04/21/20 DEXA showed osteopenia -1.8 at right total femur, with low risk for fracture.   10/28/2019 Genetic Testing   Negative genetic testing.  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  The Common Hereditary Gene Panel offered by [facility] includes sequencing and/or deletion duplication testing of the following 48 genes: APC, ATM, AXIN2, BARD1, BMPR1A, BRCA1, BRCA2, BRIP1, CDH1, CDK4, CDKN2A (p14ARF), CDKN2A (p16INK4a), CHEK2, CTNNA1, DICER1, EPCAM (Deletion/duplication testing only), GREM1 (promoter region deletion/duplication testing only), KIT, MEN1, MLH1, MSH2, MSH3, MSH6, MUTYH, NBN, NF1, NHTL1, PALB2, PDGFRA, PMS2, POLD1, POLE, PTEN, RAD50, RAD51C, RAD51D, RNF43, SDHB, SDHC, SDHD, SMAD4,  SMARCA4. STK11, TP53, TSC1, TSC2, and VHL.  The following genes were evaluated for sequence changes only: SDHA and HOXB13 c.251G>A variant only. The report date is October 28, 2019.   Cancer of sigmoid colon ([place])  09/23/2019 Tumor Marker   Baseline CEA 1.4 on 09/23/19    09/23/2019 Procedure   Colonoscopy and Upper endoscopy with Dr Alessandra Bevels  09/23/19  Upper Endoscopy Impression - Z-line regular, 35 cm from the incisors. - Small hiatal hernia. - Non-bleeding gastric ulcer. Biopsied. - Hematin (altered blood/coffee-ground-like material) in the gastric body. - Normal duodenal bulb, first portion of the duodenum and second portion of - Normal duodenal bulb, first portion of the duodenum and second portion of the duodenum.  Colonoscopy Impression - Preparation of the colon was poor. - Perianal skin tags found on perianal exam. - Likely malignant tumor in the recto-sigmoid colon. Biopsied. Tattooed. - Internal hemorrhoids.   09/23/2019 Initial Biopsy   FINAL MICROSCOPIC DIAGNOSIS: 09/23/19 A. STOMACH, ULCER, BIOPSY:  - Gastric antral mucosa with mild nonspecific reactive gastropathy  - Warthin Starry stain is negative for Helicobacter pylori   B. COLON, RECTOSIGMOID, MASS, BIOPSY:  - Adenocarcinoma, see comment  COMMENT:   B.  Immunohistochemical stains for MMR-related proteins are pending and  will be reported in an addendum. Dr. Saralyn Pilar reviewed the case and  concurs with the diagnosis. Dr. Alessandra Bevels was notified on 09/24/2019.     ADDENDUM:  Mismatch Repair Protein (IHC)  SUMMARY INTERPRETATION: NORMAL  There is preserved expression of the major MMR proteins. There is a very  low probability that microsatellite instability (MSI) is present.  However, certain clinically significant MMR protein mutations may result  in preservation of nuclear expression. It is recommended that the  preservation of protein expression be correlated with molecular based  MSI testing.   IHC EXPRESSION RESULTS  TEST           RESULT  MLH1:          Preserved nuclear expression  MSH2:          Preserved nuclear expression  MSH6:          Preserved nuclear expression  PMS2:          Preserved nuclear expression    09/23/2019 Imaging   CT CAP W Contrast 09/23/19  IMPRESSION: 1. There is eccentric wall thickening of the rectosigmoid  colon, in keeping with mass identified and biopsied by colonoscopy.   2. There are prominent subcentimeter lymph nodes in the sigmoid mesocolon, suspicious for nodal metastatic disease. There is no other lymphadenopathy in the abdomen or pelvis.   3. There are multiple small bilateral pulmonary nodules measuring up to 4 mm, nonspecific although very suspicious for pulmonary metastatic disease. Given history of breast malignancy, previous imaging of the chest would be very helpful to assess for stability. This report may be addended for comparison if prior imaging can be obtained.   4. Status post bilateral mastectomy with irregular subpleural opacity of the anterior right upper lobe, consistent with history of breast malignancy and radiation fibrosis.   5. There is adjacent nodularity of the right pulmonary apex, the largest discrete nodular component measuring up to 1.1 cm. This is more favored infectious or inflammatory although nonspecific and metastatic disease is not excluded. As as above, prior imaging would be very helpful to assess.   6. Small focus of nonspecific infectious or inflammatory airspace disease of the perihilar left upper lobe.   09/26/2019 Initial Diagnosis   Cancer of  sigmoid colon ([place])   10/28/2019 PET scan   IMPRESSION: 1. Increased tracer uptake within the pelvis corresponding to wall thickening of the rectosigmoid colon favored to represent primary colorectal carcinoma. No findings of FDG avid nodal metastasis or solid organ metastasis within the abdomen or pelvis. 2. Within the right lung apex there is a cluster of 3 subpleural nodular densities which exhibit varying degrees of mild FDG uptake with SUV max between 1.9 and 3.1. Morphologically the appearance is suggestive of benign postinflammatory pleuroparenchymal scarring. This would be an atypical pattern of metastatic disease, which is considered less favored, but not excluded. Attention in this  area on serial follow-up imaging is advised. 3. Two small, less than 5 mm left lower lobe lung nodules are too small to reliably characterize. 4.  Aortic Atherosclerosis (ICD10-I70.0). 5. Nonobstructing right renal calculus.     11/22/2019 Cancer Staging   Staging form: Colon and Rectum, AJCC 8th Edition - Pathologic stage from 11/22/2019: Stage IIA (pT3, pN0, cM0) - Signed by [name], MD on 12/17/2019   11/22/2019 Surgery   XI ROBOT ASSISTED SIGMOIDECTOMY and FLEXIBLE SIGMOIDOSCOPY by Dr white and Dr Marcello Moores   11/22/2019 Pathology Results   SURGICAL PATHOLOGY  CASE: [id number]  PATIENT: Renelda Mom  Surgical Pathology Report      Clinical History: colon cancer      FINAL MICROSCOPIC DIAGNOSIS:   A. SIGMOID COLON, RESECTION:  - Invasive moderately differentiated adenocarcinoma, 4.5 cm, involving  distal sigmoid colon  - Carcinoma focally invades into pericolonic soft tissue  - Resection margins are negative for carcinoma  - Negative for lymphovascular or perineural invasion  - Eighteen benign lymph nodes (0/18)  - See oncology table   B. FINAL DISTAL MARGIN, EXCISION:  - Colonic donut, negative for carcinoma    04/15/2020 Imaging   CT Chest  IMPRESSION: 1. New nodular 1.9 cm focus of consolidation in the anterior right lower lobe with surrounding mild patchy tree-in-bud opacity, nonspecific but favoring infectious/inflammatory etiology. 2. Clustered nodularity in the apical right upper lobe is stable in size and newly cavitary. These findings are indeterminate for atypical infection such as MAI versus response to therapy of pulmonary metastases. Additional scattered tiny left pulmonary nodules are stable. Suggest close chest CT follow-up in 3 months. 3. No thoracic adenopathy. 4. Stable dilated main pulmonary artery, suggesting chronic pulmonary arterial hypertension. 5. Aortic Atherosclerosis (ICD10-I70.0).   04/17/2020 Tumor Marker   Her Guardant Reveal  showed negative - ctDNA not detected.    07/08/2020 Imaging   CT Chest  IMPRESSION: Slight reduction in size of hypermetabolic right upper lobe pulmonary nodule, nonspecific though favored to represent area of infection/inflammation as was initially suggested on chest CT performed 04/15/2020. No biopsy attempted at this time.      CURRENT THERAPY:  Surveillance for colon cancer and breast cancer  Anastrozole since 04/2018. Switched to Exemestane in 08/2020 due to joint pain.   INTERVAL HISTORY:  ALEC MCPHEE is here for a follow up of colon cancer. She was last seen by me 07/02/20. She presents to the clinic alone. She notes in the last 2 weeks she has pain from her hips down, mostly in her right side. She notes she is more active in the warmer weather. She denies abdominal pain. She notes this is intermittent but can wake her up at night. Ice helps the pain. She notes her stool output is either hard small balls with straining or diarrhea. This has been like this in the past  6 months.  She notes she continues to lose hair from Anastrozole.    REVIEW OF SYSTEMS:   Constitutional: Denies fevers, chills or abnormal weight loss Eyes: Denies blurriness of vision Ears, nose, mouth, throat, and face: Denies mucositis or sore throat Respiratory: Denies cough, dyspnea or wheezes Cardiovascular: Denies palpitation, chest discomfort or lower extremity swelling Gastrointestinal:  Denies nausea, heartburn (+)change in bowel habits Skin: Denies abnormal skin rashes Lymphatics: Denies new lymphadenopathy or easy bruising Neurological:Denies numbness, tingling or new weaknesses Behavioral/Psych: Mood is stable, no new changes  All other systems were reviewed with the patient and are negative.  MEDICAL HISTORY:  Past Medical History:  Diagnosis Date  . Allergies 1995   Per patient report 11/14/18  . Breast cancer ([place])   . Breast cancer, right breast ([place]) 09/2017   S/P mastectomy 12/05/2017  .  Family history of breast cancer   . Headache 1968   per patient report, started as a teenager  . 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  . Osteopenia 03/2018   Seen on DEXA Scan  . PONV (postoperative nausea and vomiting)     SURGICAL HISTORY: Past Surgical History:  Procedure Laterality Date  . BACK SURGERY  2003   Patient will occassional back pain since 2003  . BIOPSY  09/23/2019   Procedure: BIOPSY;  Surgeon: Otis Brace, MD;  Location: [facility];  Service: Gastroenterology;;  EGD and COLON  . BREAST BIOPSY Right 09/2017  . [place]; 1992  . COLONOSCOPY WITH PROPOFOL N/A 09/23/2019   Procedure: COLONOSCOPY WITH PROPOFOL and EGD;  Surgeon: Otis Brace, MD;  Location: [facility];  Service: Gastroenterology;  Laterality: N/A;  . DILATION AND CURETTAGE OF UTERUS    . ESOPHAGOGASTRODUODENOSCOPY N/A 09/23/2019   Procedure: ESOPHAGOGASTRODUODENOSCOPY (EGD);  Surgeon: Otis Brace, MD;  Location: [facility];  Service: Gastroenterology;  Laterality: N/A;  . FLEXIBLE SIGMOIDOSCOPY N/A 11/22/2019   Procedure: FLEXIBLE SIGMOIDOSCOPY;  Surgeon: [name], MD;  Location: [facility];  Service: General;  Laterality: N/A;  . LUMBAR [place] SURGERY  2003  . MASTECTOMY Left 12/05/2017   PROPHYLACTIC MASTECTOMY  . MASTECTOMY COMPLETE / SIMPLE W/ SENTINEL NODE BIOPSY Right 12/05/2017   WITH RADIOACTIVE SEED TARGETED RIGHT AXIILARY LYMPH NODE EXCISION AND RIGHT SENTINEL LYMPH NODE BIOPSY,   . MASTECTOMY WITH RADIOACTIVE SEED GUIDED EXCISION AND AXILLARY SENTINEL LYMPH NODE BIOPSY Bilateral 12/05/2017   Procedure: RIGHT SIMPLE MASTECTOMY WITH RADIOACTIVE SEED TARGETED RIGHT AXIILARY LYMPH NODE EXCISION AND RIGHT SENTINEL LYMPH NODE BIOPSY, LEFT PROPHYLACTIC MASTECTOMY;  Surgeon: [name], MD;  Location: [facility];  Service: General;  Laterality: Bilateral;   . SUBMUCOSAL TATTOO INJECTION  09/23/2019   Procedure: SUBMUCOSAL TATTOO INJECTION;  Surgeon: Otis Brace, MD;  Location: [facility];  Service: Gastroenterology;;    I have reviewed the social history and family history with the patient and they are unchanged from previous note.  ALLERGIES:  is allergic to codeine.  MEDICATIONS:  Current Outpatient Medications  Medication Sig Dispense Refill  . exemestane (AROMASIN) 25 MG tablet Take 1 tablet (25 mg total) by mouth daily after breakfast. 30 tablet 2  . anastrozole (ARIMIDEX) 1 MG tablet TAKE 1 TABLET BY MOUTH EVERY DAY (Patient taking differently: Take 1 mg by mouth daily.) 90 tablet 4  . Calcium Carb-Cholecalciferol (CALCIUM 600 + D PO) Take 1 tablet by mouth daily.    Marland Kitchen  cholecalciferol (VITAMIN D3) 25 MCG (1000 UNIT) tablet Take 1,000 Units by mouth daily.    . Multiple Vitamins-Minerals (HAIR/SKIN/NAILS) CAPS Take 3 capsules by mouth daily.    . naproxen sodium (ALEVE) 220 MG tablet Take 220 mg by mouth daily as needed (pain).    . Prenatal MV & Min w/FA-DHA (PRENATAL GUMMIES PO) Take 3 tablets by mouth daily.    . Pseudoephedrine-Naproxen Na (ALEVE-D SINUS & HEADACHE PO) Take 1 tablet by mouth daily as needed (allergies).    . vitamin E 180 MG (400 UNITS) capsule Take 400 Units by mouth daily.     No current facility-administered medications for this visit.    PHYSICAL EXAMINATION: ECOG PERFORMANCE STATUS: 0 - Asymptomatic  Vitals:   09/02/20 0831  BP: 136/84  Pulse: 79  Resp: 18  Temp: (!) 97.5 F (36.4 C)  SpO2: 98%   Filed Weights   09/02/20 0831  Weight: 143 lb 6.4 oz (65 kg)    Due to COVID19 we will limit examination to appearance. Patient had no complaints.  GENERAL:alert, no distress and comfortable SKIN: skin color normal, no rashes or significant lesions EYES: normal, Conjunctiva are pink and non-injected, sclera clear  NEURO: alert & oriented x 3 with fluent speech   LABORATORY DATA:  I have  reviewed the data as listed CBC Latest Ref Rng & Units 07/07/2020 06/30/2020 04/15/2020  WBC 4.0 - 10.5 K/uL 3.8(L) 4.3 5.1  Hemoglobin 12.0 - 15.0 g/dL 12.8 12.7 12.4  Hematocrit 36.0 - 46.0 % 39.7 38.9 37.4  Platelets 150 - 400 K/uL 264 267 229     CMP Latest Ref Rng & Units 06/30/2020 04/15/2020 02/24/2020  Glucose 70 - 99 mg/dL 102(H) 101(H) 106(H)  BUN 8 - 23 mg/dL '17 20 20  ' Creatinine 0.44 - 1.00 mg/dL 0.79 0.85 0.78  Sodium 135 - 145 mmol/L 139 140 137  Potassium 3.5 - 5.1 mmol/L 4.2 4.0 4.1  Chloride 98 - 111 mmol/L 104 106 103  CO2 22 - 32 mmol/L '25 28 28  ' Calcium 8.9 - 10.3 mg/dL 9.7 9.5 9.3  Total Protein 6.5 - 8.1 g/dL 7.2 7.2 6.8  Total Bilirubin 0.3 - 1.2 mg/dL 0.5 0.7 0.3  Alkaline Phos 38 - 126 U/L 111 73 93  AST 15 - 41 U/L '23 30 24  ' ALT 0 - 44 U/L 34 31 29      RADIOGRAPHIC STUDIES: I have personally reviewed the radiological images as listed and agreed with the findings in the report. CT Chest Wo Contrast  Result Date: 08/31/2020 CLINICAL DATA:  Follow up pulmonary nodules. History of breast and colon cancer. EXAM: CT CHEST WITHOUT CONTRAST TECHNIQUE: Multidetector CT imaging of the chest was performed following the standard protocol without IV contrast. COMPARISON:  PET-CT 07/01/2020.  Chest CT 04/15/2020 and 09/23/2019. FINDINGS: Cardiovascular: Stable mild atherosclerosis of the aorta and great vessels. There is stable mild central enlargement of the pulmonary arteries. No acute vascular findings on noncontrast imaging. A small pericardial effusion is unchanged. The heart size is stable. Mediastinum/Nodes: There are no enlarged mediastinal, hilar, internal mammary or axillary lymph nodes. Hilar assessment is limited by the lack of intravenous contrast, although the hilar contours appear unchanged.Surgical clips are present in both axilla. The thyroid gland, trachea and esophagus demonstrate no significant findings. Lungs/Pleura: No pleural effusion or pneumothorax. Three  right apical nodules are unchanged, including cavitary lesions measuring 1.3 cm on image 23/5 and 0.8 cm on image 29/5. There is a stable non cavitary  1.2 cm lesion on image 30/5. Mass in the anterior inferior aspect of the right lower lobe measures 3.3 x 1.4 cm on image 110/5, compared with approximately 3.3 x 1.7 cm on PET-CT. This has enlarged from the prior chest CT of 04/15/2020 at which time it was ill-defined. This was hypermetabolic on PET-CT. Small left lung nodules measuring up to 6 mm in the lower lobe on image 103/5 are stable. Upper abdomen: The visualized upper abdomen appears stable without significant findings. Musculoskeletal/Chest wall: There is no chest wall mass or suspicious osseous finding. Previous bilateral mastectomy without evidence of chest wall recurrence. IMPRESSION: 1. Persistent mass anteriorly in the right lower lobe, hypermetabolic on PET-CT performed 2 months ago and little changed in appearance over this interval. This remains suspicious for malignancy. Consider tissue sampling or continued CT follow-up in 3-6 months. 2. The additional bilateral pulmonary nodules are unchanged, including cavitary right apical lesions which have been present for some time and are likely infectious/inflammatory. 3. No new or enlarging pulmonary nodules identified.  No adenopathy. Electronically Signed   By: Richardean Sale M.D.   On: 08/31/2020 17:07     ASSESSMENT & PLAN:  Sonia Sandoval is a 70 y.o. female with   1. Lung nodules, new 3.3cm mass in RLL -Her initial staging CT scan for colon cancer showed bilateral multiple small nodules measuring up to 4 mm, and heterogeneous airspace opacity of therightupper lobe, which we think is related to her previous breast radiation. -Her repeated CT Chest from 04/16/20 showedher known indeterminate right upper lobe nodularity is stable, and likely benign, but there is new 1.9cm consolidation in the anterior right lower lobe.radiologyfeels this  is possible inflammation. -Her PET from 3/2/222 showed hypermetabolic new 3.3 cm mass in anterior right lower lobe, enlarging from previous CT scan, highly concerning for malignancy. Other pulmonary nodules are stable, no hypermetabolic adenopathy.  -Her CT Chest from 08/31/20 shows stable size of the RLL lung mass, but shape has mildly changed, flatter. Her small nodules are also stable. No new or enlarging pulmonary nodules identified.  No adenopathy. No high concern for malignancy. Will continue to monitor with repeat scan in 3 months. I personally reviewed the san images and discussed with pt  -She is overall stable. Will f/u in 3 months and repeat CT chest in 6 months   2. Colon Cancer,pT3N0M0, stage II -She was diagnosed in 08/2019. She is found to have malignant tumor inrecto-sigmoid and biopsy confirmedadenocarcinoma.  -She proceeded with colon surgery with Dr Dema Severin and Dr Marcello Moores on 11/22/19.  -Adjuvant chemotherapy was not recommended -GuardianReveal (circulating tumor DNA ) in December 2021 was negative. Given insurance did not cover this, she is not interested in repeating this test.  -plan to repeat CT scan in 06/2021  3. H/o Right Breast Cancer, G3, T2N1, ER+/PR+/HER-, Osteopenia  -She was diagnosed in 09/2017. She was treated with B/l mastectomies by Dr Brantley Stage and adjuvant radiation by Dr Lisbeth Renshaw  -She is currently on AI with Anastrozole since 04/2018.Tolerating well. -She is being followed by Dr Jana Hakim.  -Her 04/21/20 DEXA showed osteopenia -1.8 at right total femur, with low risk for fracture. Repeat every 2 years.  -She has had progressive LE joint pain recently. I discussed the option of switching to Exemestane or Tamoxifen. I discussed side effects of both with her. She is willing to switch to Exemestane (08/2020).   4. Genetic Testingshowed VUS of MSH3 otherwise, negative for pathogenetic mutations  5. Alcohol Use  -She drinks  6-12 ounces of wine almost daily. I  recommend she stop given she will be on chemotherapy and her overall cancer treatment. She has quit alcohol   6. LE joint pain  -She has known lower back pain from prior ruptured disc.  -She more recently has had b/l hip pain radiating down her leg. Pain is mainly on her right leg. She is more active and the pain occurs almost every day.  -She notes Naproxin helps ease the pain but does not resolve it. Ice also helps more.  -I discussed anastrozole can contribute to joint pain. I discussed the option of switching to Exemestane or Tamoxifen. She is willing to try Exemestane. Will stop anastrozole for now to see if her hip and back pain improves.   PLAN: -CT scan reviewed, plan to continue surveillance  -we will stop anastrozole due to her bilateral hip and back pain, I called in Exemestane today.  She will start taking 4 to 8 weeks when her pain improves  -Lab and f/u in 3 months.    No problem-specific Assessment & Plan notes found for this encounter.   No orders of the defined types were placed in this encounter.  All questions were answered. The patient knows to call the clinic with any problems, questions or concerns. No barriers to learning was detected. The total time spent in the appointment was 30 minutes.     [name], MD 09/02/2020   I, Joslyn Devon, am acting as scribe for [name], MD.   I have reviewed the above documentation for accuracy and completeness, and I agree with the above.

## 2020-08-31 ENCOUNTER — Ambulatory Visit (HOSPITAL_COMMUNITY)
Admission: RE | Admit: 2020-08-31 | Discharge: 2020-08-31 | Disposition: A | Discharge status: Home / Self Care | Payer: BC Managed Care – PPO | Source: Ambulatory Visit | Attending: Hematology | Admitting: Hematology

## 2020-08-31 ENCOUNTER — Other Ambulatory Visit: Payer: Self-pay

## 2020-08-31 DIAGNOSIS — C187 Malignant neoplasm of sigmoid colon: Secondary | ICD-10-CM | POA: Insufficient documentation

## 2020-09-02 ENCOUNTER — Encounter: Payer: Self-pay | Admitting: Hematology

## 2020-09-02 ENCOUNTER — Telehealth: Payer: Self-pay | Admitting: Hematology

## 2020-09-02 ENCOUNTER — Inpatient Hospital Stay: Payer: BC Managed Care – PPO | Attending: Hematology | Admitting: Hematology

## 2020-09-02 ENCOUNTER — Other Ambulatory Visit: Payer: Self-pay

## 2020-09-02 VITALS — BP 136/84 | HR 79 | Temp 97.5°F | Resp 18 | Ht 63.0 in | Wt 143.4 lb

## 2020-09-02 DIAGNOSIS — I313 Pericardial effusion (noninflammatory): Secondary | ICD-10-CM | POA: Insufficient documentation

## 2020-09-02 DIAGNOSIS — J984 Other disorders of lung: Secondary | ICD-10-CM | POA: Diagnosis not present

## 2020-09-02 DIAGNOSIS — C50511 Malignant neoplasm of lower-outer quadrant of right female breast: Secondary | ICD-10-CM | POA: Insufficient documentation

## 2020-09-02 DIAGNOSIS — C187 Malignant neoplasm of sigmoid colon: Secondary | ICD-10-CM | POA: Diagnosis not present

## 2020-09-02 DIAGNOSIS — M25552 Pain in left hip: Secondary | ICD-10-CM | POA: Diagnosis not present

## 2020-09-02 DIAGNOSIS — M85851 Other specified disorders of bone density and structure, right thigh: Secondary | ICD-10-CM | POA: Insufficient documentation

## 2020-09-02 DIAGNOSIS — Z9013 Acquired absence of bilateral breasts and nipples: Secondary | ICD-10-CM | POA: Insufficient documentation

## 2020-09-02 DIAGNOSIS — C19 Malignant neoplasm of rectosigmoid junction: Secondary | ICD-10-CM | POA: Diagnosis not present

## 2020-09-02 DIAGNOSIS — I700 Atherosclerosis of aorta: Secondary | ICD-10-CM | POA: Diagnosis not present

## 2020-09-02 DIAGNOSIS — Z923 Personal history of irradiation: Secondary | ICD-10-CM | POA: Diagnosis not present

## 2020-09-02 DIAGNOSIS — K644 Residual hemorrhoidal skin tags: Secondary | ICD-10-CM | POA: Insufficient documentation

## 2020-09-02 DIAGNOSIS — N200 Calculus of kidney: Secondary | ICD-10-CM | POA: Diagnosis not present

## 2020-09-02 DIAGNOSIS — M25551 Pain in right hip: Secondary | ICD-10-CM | POA: Insufficient documentation

## 2020-09-02 DIAGNOSIS — Z170 Estrogen receptor positive status [ER+]: Secondary | ICD-10-CM | POA: Diagnosis not present

## 2020-09-02 DIAGNOSIS — Z79811 Long term (current) use of aromatase inhibitors: Secondary | ICD-10-CM | POA: Diagnosis not present

## 2020-09-02 DIAGNOSIS — Z79899 Other long term (current) drug therapy: Secondary | ICD-10-CM | POA: Insufficient documentation

## 2020-09-02 DIAGNOSIS — Z885 Allergy status to narcotic agent status: Secondary | ICD-10-CM | POA: Diagnosis not present

## 2020-09-02 DIAGNOSIS — R918 Other nonspecific abnormal finding of lung field: Secondary | ICD-10-CM | POA: Insufficient documentation

## 2020-09-02 DIAGNOSIS — K648 Other hemorrhoids: Secondary | ICD-10-CM | POA: Diagnosis not present

## 2020-09-02 MED ORDER — EXEMESTANE 25 MG PO TABS: 25.0000 mg | ORAL_TABLET | Freq: Every day | ORAL | 2 refills | Status: DC

## 2020-09-02 NOTE — Telephone Encounter (Signed)
Scheduled follow-up appointment per 5/4 los. Patient is aware. ?

## 2020-09-29 ENCOUNTER — Encounter: Payer: Self-pay | Admitting: Hematology

## 2020-09-30 ENCOUNTER — Telehealth: Payer: Self-pay

## 2020-09-30 NOTE — Telephone Encounter (Signed)
Attempted to reach patient related to restarting her Anastrozole.  No answer.  Left a message for patient to call clinic.

## 2020-11-26 ENCOUNTER — Encounter: Payer: Self-pay | Admitting: Hematology

## 2020-12-03 ENCOUNTER — Encounter: Payer: Self-pay | Admitting: Hematology

## 2020-12-03 ENCOUNTER — Inpatient Hospital Stay: Payer: BC Managed Care – PPO | Attending: Hematology | Admitting: Hematology

## 2020-12-03 ENCOUNTER — Inpatient Hospital Stay: Payer: BC Managed Care – PPO

## 2020-12-03 ENCOUNTER — Other Ambulatory Visit: Payer: Self-pay

## 2020-12-03 VITALS — BP 134/79 | HR 88 | Temp 98.3°F | Resp 18 | Ht 63.0 in | Wt 146.3 lb

## 2020-12-03 DIAGNOSIS — C19 Malignant neoplasm of rectosigmoid junction: Secondary | ICD-10-CM | POA: Insufficient documentation

## 2020-12-03 DIAGNOSIS — C50511 Malignant neoplasm of lower-outer quadrant of right female breast: Secondary | ICD-10-CM | POA: Insufficient documentation

## 2020-12-03 DIAGNOSIS — Z79899 Other long term (current) drug therapy: Secondary | ICD-10-CM | POA: Insufficient documentation

## 2020-12-03 DIAGNOSIS — Z170 Estrogen receptor positive status [ER+]: Secondary | ICD-10-CM

## 2020-12-03 DIAGNOSIS — C187 Malignant neoplasm of sigmoid colon: Secondary | ICD-10-CM | POA: Diagnosis not present

## 2020-12-03 DIAGNOSIS — R911 Solitary pulmonary nodule: Secondary | ICD-10-CM | POA: Insufficient documentation

## 2020-12-03 LAB — COMPREHENSIVE METABOLIC PANEL
ALT: 39 U/L (ref 0–44)
AST: 30 U/L (ref 15–41)
Albumin: 3.6 g/dL (ref 3.5–5.0)
Alkaline Phosphatase: 80 U/L (ref 38–126)
Anion gap: 10 (ref 5–15)
BUN: 14 mg/dL (ref 8–23)
CO2: 22 mmol/L (ref 22–32)
Calcium: 9.3 mg/dL (ref 8.9–10.3)
Chloride: 106 mmol/L (ref 98–111)
Creatinine, Ser: 0.73 mg/dL (ref 0.44–1.00)
GFR, Estimated: >60 mL/min (ref >60)
Glucose, Bld: 122 mg/dL — ABNORMAL HIGH (ref 70–99)
Potassium: 4 mmol/L (ref 3.5–5.1)
Sodium: 138 mmol/L (ref 135–145)
Total Bilirubin: 0.3 mg/dL (ref 0.3–1.2)
Total Protein: 6.5 g/dL (ref 6.5–8.1)

## 2020-12-03 LAB — CBC WITH DIFFERENTIAL/PLATELET
Abs Immature Granulocytes: 0.02 10*3/uL (ref 0.00–0.07)
Basophils Absolute: 0 10*3/uL (ref 0.0–0.1)
Basophils Relative: 1 %
Eosinophils Absolute: 0.2 10*3/uL (ref 0.0–0.5)
Eosinophils Relative: 3 %
HCT: 35.8 % — ABNORMAL LOW (ref 36.0–46.0)
Hemoglobin: 12.2 g/dL (ref 12.0–15.0)
Immature Granulocytes: 0 %
Lymphocytes Relative: 17 %
Lymphs Abs: 1 10*3/uL (ref 0.7–4.0)
MCH: 30.4 pg (ref 26.0–34.0)
MCHC: 34.1 g/dL (ref 30.0–36.0)
MCV: 89.3 fL (ref 80.0–100.0)
Monocytes Absolute: 0.4 10*3/uL (ref 0.1–1.0)
Monocytes Relative: 7 %
Neutro Abs: 4.3 10*3/uL (ref 1.7–7.7)
Neutrophils Relative %: 72 %
Platelets: 261 10*3/uL (ref 150–400)
RBC: 4.01 MIL/uL (ref 3.87–5.11)
RDW: 12.7 % (ref 11.5–15.5)
WBC: 6 10*3/uL (ref 4.0–10.5)
nRBC: 0 % (ref 0.0–0.2)

## 2020-12-03 NOTE — Progress Notes (Addendum)
[place]   [phone] [phone]   Clinic Follow up Note   Patient Care Team: [name], [place] as PCP - General (Nurse Practitioner) [name], MD as Consulting Physician (General Surgery) Magrinat, [name], MD as Consulting Physician (Oncology) [name], MD as Consulting Physician (Radiation Oncology) [name], RN as Registered Nurse [name], MD as Referring Physician (Dermatology) [name], RN (Inactive) as Oncology Nurse Navigator [name], MD as Consulting Physician (Hematology) [name], MD as Consulting Physician (General Surgery) [name], MD as Consulting Physician (Hematology)  Date of Service:  12/03/2020  CHIEF COMPLAINT: f/u of colon cancer, h/o breast cancer  SUMMARY OF ONCOLOGIC HISTORY: Oncology History Overview Note  Cancer Staging Cancer of sigmoid colon [facility]) Staging form: Colon and Rectum, AJCC 8th Edition - Pathologic stage from 11/22/2019: Stage IIA (pT3, pN0, cM0) - Signed by [name], MD on 12/17/2019  Malignant neoplasm of lower-outer quadrant of right breast of female, estrogen receptor positive ([place]) Staging form: Breast, AJCC 8th Edition - Clinical: Stage IIB (cT2, cN1, cM0, G3, ER+, PR+, HER2-) - Unsigned - Pathologic: Stage IIA (pT2, pN1(sn), cM0, G3, ER+, PR+, HER2-) - Unsigned    Malignant neoplasm of lower-outer quadrant of right breast of female, estrogen receptor positive ([place])  10/23/2017 Initial Diagnosis    right breast lower outer quadrant biopsy 10/23/2017 for a clinical T2 N1, stage IIB invasive ductal carcinoma, grade 3, estrogen and progesterone receptor positive, HER-2 not amplified, with an MIB-1 of 20%    11/07/2017 Miscellaneous   Mammaprint on was read as low risk, predicting no significant chemotherapy benefit with a 5-year distant disease-free survival in the 97-98% range with hormone therapy alone   12/05/2017 Surgery   status post  bilateral mastectomies on 12/05/2017 showing             (a) on the left, no evidence of malignancy             (b) on the right, a pT2 pN1, stage IIB invasive ductal carcinoma, grade 3, with negative margins. By Dr Brantley Stage   01/15/2018 - 02/28/2018 Radiation Therapy   adjuvant radiation 01/15/2018 - 02/28/2018 with Dr Lisbeth Renshaw              Site/dose: The patient initially received a dose of 50.4 Gy in 28 fractions to the chest wall and supraclavicular region. This was delivered using a 3-D conformal, 4 field technique. The patient then received a boost to the mastectomy scar. This delivered an additional 10 Gy in 5 fractions using an en face electron field. The total dose was 60.4 Gy.    04/01/2018 -  Anti-estrogen oral therapy   started anastrozole in 04/2018. Switched to Exemestane in 08/2020 due to joint pain.             (a) bone density 03/07/2018 with a T score of -1.8. Her 04/21/20 DEXA showed osteopenia -1.8 at right total femur, with low risk for fracture.   10/28/2019 Genetic Testing   Negative genetic testing.  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  The Common Hereditary Gene Panel offered by [facility] includes sequencing and/or deletion duplication testing of the following 48 genes: APC, ATM, AXIN2, BARD1, BMPR1A, BRCA1, BRCA2, BRIP1, CDH1, CDK4, CDKN2A (p14ARF), CDKN2A (p16INK4a), CHEK2, CTNNA1, DICER1, EPCAM (Deletion/duplication testing only), GREM1 (promoter region deletion/duplication testing only), KIT, MEN1, MLH1, MSH2, MSH3, MSH6, MUTYH, NBN, NF1, NHTL1, PALB2, PDGFRA, PMS2, POLD1, POLE, PTEN, RAD50, RAD51C, RAD51D,  RNF43, SDHB, SDHC, SDHD, SMAD4, SMARCA4. STK11, TP53, TSC1, TSC2, and VHL.  The following genes were evaluated for sequence changes only: SDHA and HOXB13 c.251G>A variant only. The report date is October 28, 2019.   Cancer of sigmoid colon ([place])  09/23/2019 Tumor Marker   Baseline CEA 1.4 on 09/23/19    09/23/2019 Procedure   Colonoscopy and  Upper endoscopy with Dr Alessandra Bevels 09/23/19  Upper Endoscopy Impression - Z-line regular, 35 cm from the incisors. - Small hiatal hernia. - Non-bleeding gastric ulcer. Biopsied. - Hematin (altered blood/coffee-ground-like material) in the gastric body. - Normal duodenal bulb, first portion of the duodenum and second portion of - Normal duodenal bulb, first portion of the duodenum and second portion of the duodenum.  Colonoscopy Impression - Preparation of the colon was poor. - Perianal skin tags found on perianal exam. - Likely malignant tumor in the recto-sigmoid colon. Biopsied. Tattooed. - Internal hemorrhoids.   09/23/2019 Initial Biopsy   FINAL MICROSCOPIC DIAGNOSIS: 09/23/19 A. STOMACH, ULCER, BIOPSY:  - Gastric antral mucosa with mild nonspecific reactive gastropathy  - Warthin Starry stain is negative for Helicobacter pylori   B. COLON, RECTOSIGMOID, MASS, BIOPSY:  - Adenocarcinoma, see comment  COMMENT:   B.  Immunohistochemical stains for MMR-related proteins are pending and  will be reported in an addendum. Dr. Saralyn Pilar reviewed the case and  concurs with the diagnosis. Dr. Alessandra Bevels was notified on 09/24/2019.     ADDENDUM:  Mismatch Repair Protein (IHC)  SUMMARY INTERPRETATION: NORMAL  There is preserved expression of the major MMR proteins. There is a very  low probability that microsatellite instability (MSI) is present.  However, certain clinically significant MMR protein mutations may result  in preservation of nuclear expression. It is recommended that the  preservation of protein expression be correlated with molecular based  MSI testing.   IHC EXPRESSION RESULTS  TEST           RESULT  MLH1:          Preserved nuclear expression  MSH2:          Preserved nuclear expression  MSH6:          Preserved nuclear expression  PMS2:          Preserved nuclear expression    09/23/2019 Imaging   CT CAP W Contrast 09/23/19  IMPRESSION: 1. There is eccentric wall  thickening of the rectosigmoid colon, in keeping with mass identified and biopsied by colonoscopy.   2. There are prominent subcentimeter lymph nodes in the sigmoid mesocolon, suspicious for nodal metastatic disease. There is no other lymphadenopathy in the abdomen or pelvis.   3. There are multiple small bilateral pulmonary nodules measuring up to 4 mm, nonspecific although very suspicious for pulmonary metastatic disease. Given history of breast malignancy, previous imaging of the chest would be very helpful to assess for stability. This report may be addended for comparison if prior imaging can be obtained.   4. Status post bilateral mastectomy with irregular subpleural opacity of the anterior right upper lobe, consistent with history of breast malignancy and radiation fibrosis.   5. There is adjacent nodularity of the right pulmonary apex, the largest discrete nodular component measuring up to 1.1 cm. This is more favored infectious or inflammatory although nonspecific and metastatic disease is not excluded. As as above, prior imaging would be very helpful to assess.   6. Small focus of nonspecific infectious or inflammatory airspace disease of the perihilar left upper lobe.   09/26/2019 Initial  Diagnosis   Cancer of sigmoid colon ([place])   10/28/2019 PET scan   IMPRESSION: 1. Increased tracer uptake within the pelvis corresponding to wall thickening of the rectosigmoid colon favored to represent primary colorectal carcinoma. No findings of FDG avid nodal metastasis or solid organ metastasis within the abdomen or pelvis. 2. Within the right lung apex there is a cluster of 3 subpleural nodular densities which exhibit varying degrees of mild FDG uptake with SUV max between 1.9 and 3.1. Morphologically the appearance is suggestive of benign postinflammatory pleuroparenchymal scarring. This would be an atypical pattern of metastatic disease, which is considered less favored, but  not excluded. Attention in this area on serial follow-up imaging is advised. 3. Two small, less than 5 mm left lower lobe lung nodules are too small to reliably characterize. 4.  Aortic Atherosclerosis (ICD10-I70.0). 5. Nonobstructing right renal calculus.     11/22/2019 Cancer Staging   Staging form: Colon and Rectum, AJCC 8th Edition - Pathologic stage from 11/22/2019: Stage IIA (pT3, pN0, cM0) - Signed by [name], MD on 12/17/2019    11/22/2019 Surgery   XI ROBOT ASSISTED SIGMOIDECTOMY and FLEXIBLE SIGMOIDOSCOPY by Dr white and Dr Marcello Moores   11/22/2019 Pathology Results   SURGICAL PATHOLOGY  CASE: [id number]  PATIENT: Sonia Sandoval  Surgical Pathology Report      Clinical History: colon cancer      FINAL MICROSCOPIC DIAGNOSIS:   A. SIGMOID COLON, RESECTION:  - Invasive moderately differentiated adenocarcinoma, 4.5 cm, involving  distal sigmoid colon  - Carcinoma focally invades into pericolonic soft tissue  - Resection margins are negative for carcinoma  - Negative for lymphovascular or perineural invasion  - Eighteen benign lymph nodes (0/18)  - See oncology table   B. FINAL DISTAL MARGIN, EXCISION:  - Colonic donut, negative for carcinoma    04/15/2020 Imaging   CT Chest  IMPRESSION: 1. New nodular 1.9 cm focus of consolidation in the anterior right lower lobe with surrounding mild patchy tree-in-bud opacity, nonspecific but favoring infectious/inflammatory etiology. 2. Clustered nodularity in the apical right upper lobe is stable in size and newly cavitary. These findings are indeterminate for atypical infection such as MAI versus response to therapy of pulmonary metastases. Additional scattered tiny left pulmonary nodules are stable. Suggest close chest CT follow-up in 3 months. 3. No thoracic adenopathy. 4. Stable dilated main pulmonary artery, suggesting chronic pulmonary arterial hypertension. 5. Aortic Atherosclerosis (ICD10-I70.0).   04/17/2020  Tumor Marker   Her Guardant Reveal showed negative - ctDNA not detected.    07/08/2020 Imaging   CT Chest  IMPRESSION: Slight reduction in size of hypermetabolic right upper lobe pulmonary nodule, nonspecific though favored to represent area of infection/inflammation as was initially suggested on chest CT performed 04/15/2020. No biopsy attempted at this time.      CURRENT THERAPY:  Surveillance for colon cancer Anastrozole since 04/2018. Switched to Exemestane in 08/2020 due to joint pain.  INTERVAL HISTORY:  Sonia Sandoval is here for a follow up of colon cancer. She was last seen by me on 09/02/20. She presents to the clinic alone. She reports she is tolerating the exemestane well. She notes this is covered well by insurance. She reports she developed daily diarrhea on 09/2020. She notes she brought this to her GP's attention. Cultures were performed on a stool sample and was negative. She denies any blood in the stool. She will be due for routine colonoscopy in 12/2020.   All other systems were reviewed with the patient  and are negative.  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   Osteopenia 03/2018   Seen on DEXA Scan   PONV (postoperative nausea and vomiting)     SURGICAL HISTORY: Past Surgical History:  Procedure Laterality Date   BACK SURGERY  2003   Patient will occassional back pain since 2003   BIOPSY  09/23/2019   Procedure: BIOPSY;  Surgeon: Otis Brace, MD;  Location: [facility];  Service: Gastroenterology;;  EGD and COLON   BREAST BIOPSY Right 09/2017    CESAREAN SECTION  1985; 1992   COLONOSCOPY WITH PROPOFOL N/A 09/23/2019   Procedure: COLONOSCOPY WITH PROPOFOL and EGD;  Surgeon: Otis Brace, MD;  Location: [facility];  Service: Gastroenterology;  Laterality: N/A;   DILATION AND CURETTAGE OF UTERUS     ESOPHAGOGASTRODUODENOSCOPY N/A 09/23/2019   Procedure: ESOPHAGOGASTRODUODENOSCOPY (EGD);  Surgeon: Otis Brace, MD;  Location: [facility];  Service: Gastroenterology;  Laterality: N/A;   FLEXIBLE SIGMOIDOSCOPY N/A 11/22/2019   Procedure: FLEXIBLE SIGMOIDOSCOPY;  Surgeon: [name], MD;  Location: [facility];  Service: General;  Laterality: N/A;   LUMBAR [place] SURGERY  2003   MASTECTOMY Left 12/05/2017   PROPHYLACTIC MASTECTOMY   MASTECTOMY COMPLETE / SIMPLE W/ SENTINEL NODE BIOPSY Right 12/05/2017   WITH RADIOACTIVE SEED TARGETED RIGHT AXIILARY LYMPH NODE EXCISION AND RIGHT SENTINEL LYMPH NODE BIOPSY,    MASTECTOMY WITH RADIOACTIVE SEED GUIDED EXCISION AND AXILLARY SENTINEL LYMPH NODE BIOPSY Bilateral 12/05/2017   Procedure: RIGHT SIMPLE MASTECTOMY WITH RADIOACTIVE SEED TARGETED RIGHT AXIILARY LYMPH NODE EXCISION AND RIGHT SENTINEL LYMPH NODE BIOPSY, LEFT PROPHYLACTIC MASTECTOMY;  Surgeon: [name], MD;  Location: [place];  Service: General;  Laterality: Bilateral;   SUBMUCOSAL TATTOO INJECTION  09/23/2019   Procedure: SUBMUCOSAL TATTOO INJECTION;  Surgeon: Otis Brace, MD;  Location: [facility];  Service: Gastroenterology;;    I have reviewed the social history and family history with the patient and they are unchanged from previous note.  ALLERGIES:  is allergic to codeine.  MEDICATIONS:  Current Outpatient Medications  Medication Sig Dispense Refill   Calcium Carb-Cholecalciferol (CALCIUM 600 + D PO) Take 1 tablet by mouth daily.     exemestane (AROMASIN) 25 MG tablet Take 1 tablet (25 mg total) by mouth daily after breakfast. 30 tablet 2   Multiple Vitamin (MULTI-VITAMIN) tablet Take 1 tablet by mouth  daily.     Multiple Vitamins-Minerals (HAIR/SKIN/NAILS) CAPS Take 3 capsules by mouth daily.     naproxen sodium (ALEVE) 220 MG tablet Take 220 mg by mouth daily as needed (pain).     pantoprazole (PROTONIX) 40 MG tablet Take 40 mg by mouth every morning.     Prenatal MV & Min w/FA-DHA (PRENATAL GUMMIES PO) Take 3 tablets by mouth daily.     Pseudoephedrine-Naproxen Na (ALEVE-D SINUS & HEADACHE PO) Take 1 tablet by mouth daily as needed (allergies).     tiZANidine (ZANAFLEX) 4 MG tablet Take 2-4 mg by mouth at bedtime as needed.     vitamin E 180 MG (400 UNITS) capsule Take  400 Units by mouth daily.     cholecalciferol (VITAMIN D3) 25 MCG (1000 UNIT) tablet Take 1,000 Units by mouth daily.     No current facility-administered medications for this visit.    PHYSICAL EXAMINATION: ECOG PERFORMANCE STATUS: 0 - Asymptomatic  Vitals:   12/03/20 1415  BP: 134/79  Pulse: 88  Resp: 18  Temp: 98.3 F (36.8 C)  SpO2: 100%   Wt Readings from Last 3 Encounters:  12/03/20 146 lb 4.8 oz (66.4 kg)  09/02/20 143 lb 6.4 oz (65 kg)  07/07/20 139 lb (63 kg)    GENERAL:alert, no distress and comfortable SKIN: skin color, texture, turgor are normal, no rashes or significant lesions EYES: normal, Conjunctiva are pink and non-injected, sclera clear  NECK: supple, thyroid normal size, non-tender, without nodularity LYMPH:  no palpable lymphadenopathy in the cervical, axillary LUNGS: clear to auscultation and percussion with normal breathing effort HEART: regular rate & rhythm and no murmurs and no lower extremity edema ABDOMEN:abdomen soft, non-tender and normal bowel sounds Musculoskeletal:no cyanosis of digits and no clubbing  NEURO: alert & oriented x 3 with fluent speech, no focal motor/sensory deficits BREAST: No palpable mass, nodules or adenopathy bilaterally. Breast exam benign.   LABORATORY DATA:  I have reviewed the data as listed CBC Latest Ref Rng & Units 12/03/2020 07/07/2020 06/30/2020   WBC 4.0 - 10.5 K/uL 6.0 3.8(L) 4.3  Hemoglobin 12.0 - 15.0 g/dL 12.2 12.8 12.7  Hematocrit 36.0 - 46.0 % 35.8(L) 39.7 38.9  Platelets 150 - 400 K/uL 261 264 267     CMP Latest Ref Rng & Units 12/03/2020 06/30/2020 04/15/2020  Glucose 70 - 99 mg/dL 122(H) 102(H) 101(H)  BUN 8 - 23 mg/dL '14 17 20  ' Creatinine 0.44 - 1.00 mg/dL 0.73 0.79 0.85  Sodium 135 - 145 mmol/L 138 139 140  Potassium 3.5 - 5.1 mmol/L 4.0 4.2 4.0  Chloride 98 - 111 mmol/L 106 104 106  CO2 22 - 32 mmol/L '22 25 28  ' Calcium 8.9 - 10.3 mg/dL 9.3 9.7 9.5  Total Protein 6.5 - 8.1 g/dL 6.5 7.2 7.2  Total Bilirubin 0.3 - 1.2 mg/dL 0.3 0.5 0.7  Alkaline Phos 38 - 126 U/L 80 111 73  AST 15 - 41 U/L '30 23 30  ' ALT 0 - 44 U/L 39 34 31      RADIOGRAPHIC STUDIES: I have personally reviewed the radiological images as listed and agreed with the findings in the report. No results found.   ASSESSMENT & PLAN:  Sonia Sandoval is a 70 y.o. female with   1. Lung nodules, new 3.3cm mass in RLL -Her initial staging CT scan for colon cancer showed bilateral multiple small nodules measuring up to 4 mm, and heterogeneous airspace opacity of the right upper lobe, which we think is related to her previous breast radiation. -Her repeated CT Chest from 04/16/20 showed her known indeterminate right upper lobe nodularity is stable, and likely benign, but there is new 1.9cm consolidation in the anterior right lower lobe. radiology feels this is possible inflammation. -Her PET from 3/2/222 showed hypermetabolic new 3.3 cm mass in anterior right lower lobe, enlarging from previous CT scan, highly concerning for malignancy. Other pulmonary nodules are stable, no hypermetabolic adenopathy.  -Her CT Chest from 08/31/20 shows stable size of the RLL lung mass, but shape has mildly changed, flatter. Her small nodules are also stable. No new or enlarging pulmonary nodules identified.  No adenopathy.  -She remains asymptomatic. Will f/u in  3 months and repeat  CT chest prior to visit   2. Colon Cancer, pT3N0M0, stage II -She was diagnosed in 08/2019. She is found to have malignant tumor in recto-sigmoid and biopsy confirmed adenocarcinoma. -She proceeded with colon surgery with Dr Dema Severin and Dr Marcello Moores on 11/22/19.  -Adjuvant chemotherapy was not recommended -GuardianReveal (circulating tumor DNA) in December 2021 was negative. Given insurance did not cover this, she is not interested in repeating this test.  -plan to repeat CT scan in 06/2021   3. H/o Right Breast Cancer, G3, T2N1, ER+/PR+/HER-, Osteopenia  -She was diagnosed in 09/2017. She was treated with B/l mastectomies by Dr Brantley Stage and adjuvant radiation by Dr Lisbeth Renshaw -She started on Anastrozole in 04/2018.  -She is being followed by Dr Jana Hakim.   -Her 04/21/20 DEXA showed osteopenia -1.8 at right total femur, with low risk for fracture. Repeat every 2 years. -We switched her to exemestane in 08/2020 due to concerns with joint pain.    4. 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   Orders Placed This Encounter  Procedures   CT Chest Wo Contrast    Standing Status:   Future    Standing Expiration Date:   12/03/2021    Order Specific Question:   Preferred imaging location?    Answer:   [facility]    Order Specific Question:   Release to patient    Answer:   Immediate   All questions were answered. The patient knows to call the clinic with any problems, questions or concerns. No barriers to learning was detected. The total time spent in the appointment was 30 minutes.     [name], MD 12/03/2020   I, Wilburn Mylar, am acting as scribe for [name], MD.   I have reviewed the above documentation for accuracy and completeness, and I agree with the above.

## 2020-12-28 ENCOUNTER — Telehealth: Payer: Self-pay | Admitting: Hematology

## 2020-12-28 NOTE — Telephone Encounter (Signed)
Sch per 8/4 los, pt aware

## 2021-01-05 ENCOUNTER — Encounter: Payer: Self-pay | Admitting: Hematology

## 2021-01-10 ENCOUNTER — Other Ambulatory Visit: Payer: Self-pay | Admitting: Hematology

## 2021-01-11 ENCOUNTER — Encounter: Payer: Self-pay | Admitting: Hematology

## 2021-02-16 ENCOUNTER — Encounter: Payer: Self-pay | Admitting: Hematology

## 2021-02-22 NOTE — Progress Notes (Signed)
Sonia Sandoval  [phone] [phone]    ID: Sonia Sandoval DOB: 1950/11/14  MR#: [id number]  LTJ#:[id number]  Patient Care Team: [name], [place] as PCP - General (Nurse Practitioner) [name], MD as Consulting Physician (General Surgery) Gracin Soohoo, [name], MD as Consulting Physician (Oncology) [name], MD as Consulting Physician (Radiation Oncology) [name], RN as Registered Nurse [name], MD as Referring Physician (Dermatology) [name], RN (Inactive) as Oncology Nurse Navigator [name], MD as Consulting Physician (Hematology) [name], MD as Consulting Physician (General Surgery) [name], MD as Consulting Physician (Hematology) OTHER MD:   CHIEF COMPLAINT: 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  She was changed from anastrozole to exemestane by Dr.Feng because of arthralgias (primarily back pain).  She is obtaining the exemestane at a good price.  She says she feels hot all the time.  Otherwise she is tolerating it well  Since her last visit, she underwent repeat bone density screening on 04/21/2020 showing a T-score of -1.8, which is considered osteopenic.  She also underwent restaging PET scan on 07/01/2020 showing: FDG-avid right lower lobe lung mass; mild FDG uptake to 3 nonspecific right apex nodules, stable compared to prior pet in 10/2019; new 4 mm right lower lobe nodule, too small to characterize.  Her most recent chest CT was done on 08/31/2020 showing: little change in appearance of persistent mass in right lower lobe; additional bilateral pulmonary nodules are unchanged; no new or enlarging pulmonary nodules; no adenopathy.  Dr. Burr Medico plans to repeat chest CT next month.     REVIEW OF  SYSTEMS: Sonia Sandoval was having significant back pain.  She saw Dr. Ellene Route and received an epidural of which is pretty much taking care of the problem.  She has been pain-free now for about 6 weeks.  She is doing a lot of yard work and walking for exercise (has 2 dogs).  She also does some yoga.  She and her family are planning a trip to [place] early December.  A detailed review of systems was otherwise stable   COVID 19 VACCINATION STATUS: Pfizer x3    HISTORY OF CURRENT ILLNESS: From the original intake note:  "Sonia Sandoval" felt that her right breast was noticeably smaller than the left. She brought this to her nurse practitioner's attention.  She had not had mammography in several years.  Sonia Sandoval was set up for bilateral diagnostic mammography with tomography and right breast ultrasonography at [facility] on 10/19/2017 showing: Breast density category B.  Highly suspicious mass in the right lower outer quadrant measuring 3.3 x 2.8 x 3.2 cm,  2 cm from the nipple with associated skin retraction. The right axilla showed a 9 mm lymph node with cortical thickening. There was no evidence of malignancy in the left breast.   Accordingly on 10/23/2017 she proceeded to biopsy of the right breast area in question. The pathology from this procedure showed ([id number]): Invasive ductal carcinoma grade III. The right axillary lymph node was also positive.  There was no lymph node tissue identified. Prognostic indicators significant for: estrogen receptor, 95% positive and progesterone receptor, 45% positive, both with strong staining intensity. Proliferation marker Ki67 at 20%. HER2  not amplified with ratios HER2/CEP17 signals 1.25 and average HER2 copies per cell 3.00  The patient's subsequent history is as  detailed below.   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   Osteopenia 03/2018   Seen on DEXA Scan   PONV (postoperative nausea and vomiting)    She denies a history of cataracts, glaucoma, emphysema, asthma, HTN, heart murmur or palpitations, GERD, stomach ulcers, issues with bowels or bladder.     PAST SURGICAL HISTORY: Past Surgical History:  Procedure Laterality Date   BACK SURGERY  2003   Patient will occassional back pain since 2003   BIOPSY  09/23/2019   Procedure: BIOPSY;  Surgeon: Otis Brace, MD;  Location: [facility];  Service: Gastroenterology;;  EGD and COLON   BREAST BIOPSY Right 09/2017   CESAREAN SECTION  1985; 1992   COLONOSCOPY WITH PROPOFOL N/A 09/23/2019   Procedure: COLONOSCOPY WITH PROPOFOL and EGD;  Surgeon: Otis Brace, MD;  Location: [facility];  Service: Gastroenterology;  Laterality: N/A;   DILATION AND CURETTAGE OF UTERUS     ESOPHAGOGASTRODUODENOSCOPY N/A 09/23/2019   Procedure: ESOPHAGOGASTRODUODENOSCOPY (EGD);  Surgeon: Otis Brace, MD;  Location: [facility];  Service: Gastroenterology;  Laterality: N/A;   FLEXIBLE SIGMOIDOSCOPY N/A 11/22/2019   Procedure: FLEXIBLE SIGMOIDOSCOPY;  Surgeon: [name], MD;  Location: [facility];  Service: General;  Laterality: N/A;   LUMBAR [place] SURGERY  2003   MASTECTOMY Left 12/05/2017   PROPHYLACTIC MASTECTOMY   MASTECTOMY COMPLETE / SIMPLE W/ SENTINEL NODE BIOPSY Right 12/05/2017   WITH RADIOACTIVE SEED TARGETED RIGHT AXIILARY LYMPH NODE EXCISION AND RIGHT SENTINEL LYMPH NODE BIOPSY,    MASTECTOMY WITH RADIOACTIVE SEED GUIDED EXCISION AND AXILLARY SENTINEL LYMPH NODE BIOPSY Bilateral 12/05/2017   Procedure: RIGHT SIMPLE MASTECTOMY WITH RADIOACTIVE SEED TARGETED RIGHT AXIILARY LYMPH NODE EXCISION  AND RIGHT SENTINEL LYMPH NODE BIOPSY, LEFT PROPHYLACTIC MASTECTOMY;  Surgeon: [name], MD;  Location: [facility];  Service: General;  Laterality: Bilateral;   SUBMUCOSAL TATTOO INJECTION  09/23/2019   Procedure: SUBMUCOSAL TATTOO INJECTION;  Surgeon: Otis Brace, MD;  Location: [facility];  Service: Gastroenterology;;   Ruptured vertebral disk surgery in 2003 under Dr. Ellene Route   FAMILY HISTORY: Family History  Problem Relation Age of Onset   Dementia Mother    Lung cancer Father    Cancer Maternal Aunt 83       breast cancer    Cancer Cousin        maternal first cousin with breast cancer in her 77s   Heart disease Maternal Grandfather    The patient's father died at age 72 due to lung cancer (heavy smoker). The patient's mother died at age 63 due to MI and Alzheimer's. The patient had no brothers or sisters. The patient's mother was 1/10 siblings, with a few other siblings having a history of Alzheimer's. There was a maternal aunt who may have had ovarian cancer, and the patient is going to verify this with family.    GYNECOLOGIC HISTORY:  No LMP recorded. Patient is postmenopausal. Menarche: 70 years old Age at first live birth: 70 years old She is GXP4.  LMP: about 25 years ago (  early 39's). She used hormone replacement for about 3-5 years. She also used oral contraception for 1 year with no complications.    SOCIAL HISTORY:  Akyah is retired from working at [employer]. Her husband of 65 years, [name], is a Writer. He also used to be a paramedic. The patient and her husband share 1 daughter, Cloyde Reams age 75 who lives in [place], [place] as a Writer. The patient has 3 sons from a previous marriage. The oldest, A.C. Penn Marcello Moores age 68 is disabled with epilepsy. The second son B. Desmond Lope age 82, lives in [place], [place] in Press photographer. The third son, H. Vevelyn Royals age 7, lives in [place], [place] in sales.   ADVANCED DIRECTIVES: The patient's  husband, [name] is her HCPOA   HEALTH MAINTENANCE: Social History   Tobacco Use   Smoking status: Never   Smokeless tobacco: Never  Vaping Use   Vaping Use: Never used  Substance Use Topics   Alcohol use: Yes    Alcohol/week: 14.0 standard drinks    Types: 14 Glasses of wine per week   Drug use: Never     Colonoscopy: 2013 in [facility]  PAP: 2014  Bone density: 2014/ osteopenia   Allergies  Allergen Reactions   Codeine Nausea And Vomiting and Other (See Comments)    headache    Current Outpatient Medications  Medication Sig Dispense Refill   Calcium Carb-Cholecalciferol (CALCIUM 600 + D PO) Take 1 tablet by mouth daily.     cholecalciferol (VITAMIN D3) 25 MCG (1000 UNIT) tablet Take 1,000 Units by mouth daily.     exemestane (AROMASIN) 25 MG tablet TAKE 1 TABLET (25 MG TOTAL) BY MOUTH DAILY AFTER BREAKFAST. 90 tablet 3   Multiple Vitamin (MULTI-VITAMIN) tablet Take 1 tablet by mouth daily.     Multiple Vitamins-Minerals (HAIR/SKIN/NAILS) CAPS Take 3 capsules by mouth daily.     naproxen sodium (ALEVE) 220 MG tablet Take 220 mg by mouth daily as needed (pain).     pantoprazole (PROTONIX) 40 MG tablet Take 40 mg by mouth every morning.     Prenatal MV & Min w/FA-DHA (PRENATAL GUMMIES PO) Take 3 tablets by mouth daily.     Pseudoephedrine-Naproxen Na (ALEVE-D SINUS & HEADACHE PO) Take 1 tablet by mouth daily as needed (allergies).     tiZANidine (ZANAFLEX) 4 MG tablet Take 2-4 mg by mouth at bedtime as needed.     vitamin E 180 MG (400 UNITS) capsule Take 400 Units by mouth daily.     No current facility-administered medications for this visit.    OBJECTIVE: White woman who appears younger than stated age  59:   02/23/21 1041  BP: 124/64  Pulse: 90  Resp: 18  Temp: 97.6 F (36.4 C)  SpO2: 98%     Body mass index is 25.86 kg/m.   Wt Readings from Last 3 Encounters:  02/23/21 146 lb (66.2 kg)  12/03/20 146 lb 4.8 oz (66.4 kg)  09/02/20 143 lb 6.4 oz  (65 kg)      ECOG FS:1 - Symptomatic but completely ambulatory  Sclerae unicteric, EOMs intact Wearing a mask No cervical or supraclavicular adenopathy Lungs no rales or rhonchi Heart regular rate and rhythm Abd soft, nontender, positive bowel sounds MSK no focal spinal tenderness, no upper extremity lymphedema Neuro: nonfocal, well oriented, appropriate affect Breasts: Status post bilateral mastectomies.  There is no evidence of chest wall recurrence.  Both axillae are benign   LAB RESULTS:  CMP     Component Value Date/Time   NA 138 12/03/2020 1358   K 4.0 12/03/2020 1358   CL 106 12/03/2020 1358   CO2 22 12/03/2020 1358   GLUCOSE 122 (H) 12/03/2020 1358   BUN 14 12/03/2020 1358   CREATININE 0.73 12/03/2020 1358   CREATININE 0.77 10/21/2019 0844   CALCIUM 9.3 12/03/2020 1358   PROT 6.5 12/03/2020 1358   ALBUMIN 3.6 12/03/2020 1358   AST 30 12/03/2020 1358   AST 25 10/21/2019 0844   ALT 39 12/03/2020 1358   ALT 28 10/21/2019 0844   ALKPHOS 80 12/03/2020 1358   BILITOT 0.3 12/03/2020 1358   BILITOT 0.4 10/21/2019 0844   GFRNONAA >60 12/03/2020 1358   GFRNONAA >60 10/21/2019 0844   GFRAA >60 12/18/2019 1026   GFRAA >60 10/21/2019 0844   Lab Results  Component Value Date   WBC 4.1 02/23/2021   NEUTROABS 2.9 02/23/2021   HGB 13.1 02/23/2021   HCT 39.8 02/23/2021   MCV 91.5 02/23/2021   PLT 257 02/23/2021   No results found for: LABCA2  No components found for: GUYQIH474  No results for input(s): INR in the last 168 hours.  No results found for: LABCA2  No results found for: CAN199  No results found for: QVZ563  No results found for: OVF643  No results found for: CA2729  No components found for: HGQUANT  Lab Results  Component Value Date   CEA1 1.4 09/23/2019   /  CEA  Date Value Ref Range Status  09/23/2019 1.4 0.0 - 4.7 ng/mL Final    Comment:    (NOTE)                             Nonsmokers          <3.9                              Smokers             <5.6 Roche [employer] Electrochemiluminescence Immunoassay (ECLIA) Values obtained with different assay methods or kits cannot be used interchangeably.  Results cannot be interpreted as absolute evidence of the presence or absence of malignant disease. Performed At: [facility] [place], [place] [id number] Rush Farmer MD YS:[id number]      No results found for: AFPTUMOR  No results found for: [place]   No results found for: TOTALPROTELP, ALBUMINELP, A1GS, A2GS, BETS, BETA2SER, GAMS, MSPIKE, SPEI (this displays SPEP labs)  No results found for: KPAFRELGTCHN, LAMBDASER, KAPLAMBRATIO (kappa/lambda light chains)  No results found for: HGBA, HGBA2QUANT, HGBFQUANT, HGBSQUAN (Hemoglobinopathy evaluation)   Lab Results  Component Value Date   LDH 181 02/21/2019    Lab Results  Component Value Date   IRON 64 09/21/2019   TIBC 395 09/21/2019   IRONPCTSAT 16 09/21/2019   (Iron and TIBC)  Lab Results  Component Value Date   FERRITIN 12 09/21/2019    Urinalysis    Component Value Date/Time   COLORURINE YELLOW 09/20/2019 1945   APPEARANCEUR CLEAR 09/20/2019 1945   LABSPEC <1.005 (L) 09/20/2019 1945   PHURINE 5.5 09/20/2019 1945   GLUCOSEU NEGATIVE 09/20/2019 1945   HGBUR NEGATIVE 09/20/2019 1945   BILIRUBINUR NEGATIVE 09/20/2019 1945   KETONESUR NEGATIVE 09/20/2019 1945   PROTEINUR NEGATIVE 09/20/2019 1945   NITRITE NEGATIVE 09/20/2019 1945   LEUKOCYTESUR SMALL (A) 09/20/2019 1945  STUDIES: No results found.   ELIGIBLE FOR AVAILABLE RESEARCH PROTOCOL: no   ASSESSMENT: 70 y.o. [place], [place] woman that is post right breast lower outer quadrant biopsy 10/23/2017 for a clinical T2 N1, stage IIB invasive ductal carcinoma, grade 3, estrogen and progesterone receptor positive, HER-2 not amplified, with an MIB-1 of 20%  (1) Mammaprint on 11/07/2017 was read as low risk, predicting no significant chemotherapy benefit  with a 5-year distant disease-free survival in the 97-98% range with hormone therapy alone  (2) status post bilateral mastectomies on 12/05/2017 showing  (a) on the left, no evidence of malignancy  (b) on the right, a pT2 pN1, stage IIB invasive ductal carcinoma, grade 3, with negative margins.  (3) adjuvant radiation 01/15/2018 - 02/28/2018  Site/dose: The patient initially received a dose of 50.4 Gy in 28 fractions to the chest wall and supraclavicular region. This was delivered using a 3-D conformal, 4 field technique. The patient then received a boost to the mastectomy scar. This delivered an additional 10 Gy in 5 fractions using an en face electron field. The total dose was 60.4 Gy.   (4) started anastrozole 04/01/2018  (a) bone density 03/07/2018 with a T score of -1.8  (b) bone density 04/21/2020 showed a T score of -1.8 (c) changed to exemestane May 2022  (5) colon cancer: Status post sigmoid resection 11/22/2019 for a 4.5 cm moderately differentiated colon carcinoma involving pericolonic soft tissue, with negative margins, and 0 of 18 regional lymph nodes involved: pT3 pN0  (a) PET scan 10/28/2019 showed in addition to the colon lesion, nodular densities in the right lung apex and small left lower lobe lung nodules requiring follow-up  (b) CEA not informative (1.4 on 09/23/2019.  (6) follow-up of lung nodules:  (A) PET scan 07/02/2020 showed a 3.3 cm right lower lobe mass with an SUV max of 10.3 as well as additional nodules.  (B) limited chest CT 07/07/2020 showed slight reduction in the size of the mass, consistent with an infectious/inflammatory nodule  (C) chest CT scan 08/31/2020 shows the mass in question again measuring 3.3 cm.  Other nodules were stable   PLAN: Sonia Sandoval is now a little over 3 years out from definitive surgery for her breast cancer with no evidence of disease recurrence.  This is favorable.  She is tolerating aromatase inhibitors well.  Which ever she has the  fewest side effects on is the 1 to go with since they will work equally well in terms of breast cancer treatment.  Currently she is doing fine on exemestane and likely I will continue that so long as cost is not an issue  She has been followed closely by Dr.Feng for her breast cancer, colon cancer and lung nodules.  At this point I am not scheduling further appointments for her with me but will let Dr. Burr Medico take over her oncology care.  Total encounter time 25 minutes.*   Rosely Fernandez, [name], MD  02/23/21 10:47 AM Medical Oncology and Hematology [facility] [place], [place] [address] Tel. [phone]    Fax. [phone]   I, Wilburn Mylar, am acting as scribe for Dr. [name]. Fergie Sherbert.  I, Lurline Del MD, have reviewed the above documentation for accuracy and completeness, and I agree with the above.   *Total Encounter Time as defined by the Centers for Medicare and Medicaid Services includes, in addition to the face-to-face time of a patient visit (documented in the note above) non-face-to-face time: obtaining and reviewing  outside history, ordering and reviewing medications, tests or procedures, care coordination (communications with other health care professionals or caregivers) and documentation in the medical record.

## 2021-02-23 ENCOUNTER — Inpatient Hospital Stay: Payer: Managed Care, Other (non HMO)

## 2021-02-23 ENCOUNTER — Other Ambulatory Visit: Payer: Self-pay

## 2021-02-23 ENCOUNTER — Inpatient Hospital Stay: Payer: Managed Care, Other (non HMO) | Attending: Hematology | Admitting: Oncology

## 2021-02-23 VITALS — BP 124/64 | HR 90 | Temp 97.6°F | Resp 18 | Ht 63.0 in | Wt 146.0 lb

## 2021-02-23 DIAGNOSIS — Z801 Family history of malignant neoplasm of trachea, bronchus and lung: Secondary | ICD-10-CM | POA: Diagnosis not present

## 2021-02-23 DIAGNOSIS — R911 Solitary pulmonary nodule: Secondary | ICD-10-CM | POA: Diagnosis not present

## 2021-02-23 DIAGNOSIS — Z885 Allergy status to narcotic agent status: Secondary | ICD-10-CM | POA: Insufficient documentation

## 2021-02-23 DIAGNOSIS — Z79811 Long term (current) use of aromatase inhibitors: Secondary | ICD-10-CM | POA: Diagnosis not present

## 2021-02-23 DIAGNOSIS — C187 Malignant neoplasm of sigmoid colon: Secondary | ICD-10-CM | POA: Diagnosis not present

## 2021-02-23 DIAGNOSIS — Z818 Family history of other mental and behavioral disorders: Secondary | ICD-10-CM | POA: Diagnosis not present

## 2021-02-23 DIAGNOSIS — Z9013 Acquired absence of bilateral breasts and nipples: Secondary | ICD-10-CM | POA: Insufficient documentation

## 2021-02-23 DIAGNOSIS — Z170 Estrogen receptor positive status [ER+]: Secondary | ICD-10-CM

## 2021-02-23 DIAGNOSIS — Z7289 Other problems related to lifestyle: Secondary | ICD-10-CM | POA: Insufficient documentation

## 2021-02-23 DIAGNOSIS — C50511 Malignant neoplasm of lower-outer quadrant of right female breast: Secondary | ICD-10-CM

## 2021-02-23 DIAGNOSIS — J984 Other disorders of lung: Secondary | ICD-10-CM | POA: Insufficient documentation

## 2021-02-23 DIAGNOSIS — Z803 Family history of malignant neoplasm of breast: Secondary | ICD-10-CM | POA: Diagnosis not present

## 2021-02-23 DIAGNOSIS — Z8249 Family history of ischemic heart disease and other diseases of the circulatory system: Secondary | ICD-10-CM | POA: Insufficient documentation

## 2021-02-23 DIAGNOSIS — C19 Malignant neoplasm of rectosigmoid junction: Secondary | ICD-10-CM | POA: Insufficient documentation

## 2021-02-23 LAB — CBC WITH DIFFERENTIAL/PLATELET
Abs Immature Granulocytes: 0.01 10*3/uL (ref 0.00–0.07)
Basophils Absolute: 0 10*3/uL (ref 0.0–0.1)
Basophils Relative: 1 %
Eosinophils Absolute: 0.1 10*3/uL (ref 0.0–0.5)
Eosinophils Relative: 2 %
HCT: 39.8 % (ref 36.0–46.0)
Hemoglobin: 13.1 g/dL (ref 12.0–15.0)
Immature Granulocytes: 0 %
Lymphocytes Relative: 18 %
Lymphs Abs: 0.7 10*3/uL (ref 0.7–4.0)
MCH: 30.1 pg (ref 26.0–34.0)
MCHC: 32.9 g/dL (ref 30.0–36.0)
MCV: 91.5 fL (ref 80.0–100.0)
Monocytes Absolute: 0.3 10*3/uL (ref 0.1–1.0)
Monocytes Relative: 7 %
Neutro Abs: 2.9 10*3/uL (ref 1.7–7.7)
Neutrophils Relative %: 72 %
Platelets: 257 10*3/uL (ref 150–400)
RBC: 4.35 MIL/uL (ref 3.87–5.11)
RDW: 13.2 % (ref 11.5–15.5)
WBC: 4.1 10*3/uL (ref 4.0–10.5)
nRBC: 0 % (ref 0.0–0.2)

## 2021-02-23 LAB — COMPREHENSIVE METABOLIC PANEL
ALT: 22 U/L (ref 0–44)
AST: 22 U/L (ref 15–41)
Albumin: 3.9 g/dL (ref 3.5–5.0)
Alkaline Phosphatase: 62 U/L (ref 38–126)
Anion gap: 10 (ref 5–15)
BUN: 13 mg/dL (ref 8–23)
CO2: 25 mmol/L (ref 22–32)
Calcium: 9.6 mg/dL (ref 8.9–10.3)
Chloride: 106 mmol/L (ref 98–111)
Creatinine, Ser: 0.77 mg/dL (ref 0.44–1.00)
GFR, Estimated: >60 mL/min (ref >60)
Glucose, Bld: 72 mg/dL (ref 70–99)
Potassium: 3.8 mmol/L (ref 3.5–5.1)
Sodium: 141 mmol/L (ref 135–145)
Total Bilirubin: 0.6 mg/dL (ref 0.3–1.2)
Total Protein: 6.8 g/dL (ref 6.5–8.1)

## 2021-02-24 ENCOUNTER — Encounter: Payer: Self-pay | Admitting: Hematology

## 2021-03-02 ENCOUNTER — Other Ambulatory Visit: Payer: Medicare Other

## 2021-03-02 ENCOUNTER — Inpatient Hospital Stay: Payer: Managed Care, Other (non HMO)

## 2021-03-04 ENCOUNTER — Ambulatory Visit: Payer: Medicare Other | Admitting: Hematology

## 2021-03-08 ENCOUNTER — Ambulatory Visit (HOSPITAL_COMMUNITY)
Admission: RE | Admit: 2021-03-08 | Discharge: 2021-03-08 | Disposition: A | Discharge status: Home / Self Care | Payer: Managed Care, Other (non HMO) | Source: Ambulatory Visit | Attending: Hematology | Admitting: Hematology

## 2021-03-08 ENCOUNTER — Inpatient Hospital Stay: Payer: Managed Care, Other (non HMO) | Attending: Hematology

## 2021-03-08 ENCOUNTER — Other Ambulatory Visit: Payer: Self-pay

## 2021-03-08 DIAGNOSIS — C19 Malignant neoplasm of rectosigmoid junction: Secondary | ICD-10-CM | POA: Diagnosis not present

## 2021-03-08 DIAGNOSIS — Z170 Estrogen receptor positive status [ER+]: Secondary | ICD-10-CM | POA: Insufficient documentation

## 2021-03-08 DIAGNOSIS — C50511 Malignant neoplasm of lower-outer quadrant of right female breast: Secondary | ICD-10-CM | POA: Diagnosis present

## 2021-03-08 LAB — COMPREHENSIVE METABOLIC PANEL
ALT: 23 U/L (ref 0–44)
AST: 29 U/L (ref 15–41)
Albumin: 4 g/dL (ref 3.5–5.0)
Alkaline Phosphatase: 66 U/L (ref 38–126)
Anion gap: 8 (ref 5–15)
BUN: 13 mg/dL (ref 8–23)
CO2: 25 mmol/L (ref 22–32)
Calcium: 9 mg/dL (ref 8.9–10.3)
Chloride: 107 mmol/L (ref 98–111)
Creatinine, Ser: 0.79 mg/dL (ref 0.44–1.00)
GFR, Estimated: >60 mL/min (ref >60)
Glucose, Bld: 94 mg/dL (ref 70–99)
Potassium: 3.6 mmol/L (ref 3.5–5.1)
Sodium: 140 mmol/L (ref 135–145)
Total Bilirubin: 0.4 mg/dL (ref 0.3–1.2)
Total Protein: 6.8 g/dL (ref 6.5–8.1)

## 2021-03-08 LAB — CBC WITH DIFFERENTIAL/PLATELET
Abs Immature Granulocytes: 0.01 10*3/uL (ref 0.00–0.07)
Basophils Absolute: 0 10*3/uL (ref 0.0–0.1)
Basophils Relative: 1 %
Eosinophils Absolute: 0.2 10*3/uL (ref 0.0–0.5)
Eosinophils Relative: 5 %
HCT: 36.9 % (ref 36.0–46.0)
Hemoglobin: 12.4 g/dL (ref 12.0–15.0)
Immature Granulocytes: 0 %
Lymphocytes Relative: 19 %
Lymphs Abs: 0.7 10*3/uL (ref 0.7–4.0)
MCH: 30.8 pg (ref 26.0–34.0)
MCHC: 33.6 g/dL (ref 30.0–36.0)
MCV: 91.8 fL (ref 80.0–100.0)
Monocytes Absolute: 0.3 10*3/uL (ref 0.1–1.0)
Monocytes Relative: 8 %
Neutro Abs: 2.5 10*3/uL (ref 1.7–7.7)
Neutrophils Relative %: 67 %
Platelets: 216 10*3/uL (ref 150–400)
RBC: 4.02 MIL/uL (ref 3.87–5.11)
RDW: 13.2 % (ref 11.5–15.5)
WBC: 3.8 10*3/uL — ABNORMAL LOW (ref 4.0–10.5)
nRBC: 0 % (ref 0.0–0.2)

## 2021-03-10 ENCOUNTER — Telehealth: Payer: Self-pay

## 2021-03-10 ENCOUNTER — Encounter: Payer: Self-pay | Admitting: Hematology

## 2021-03-10 ENCOUNTER — Inpatient Hospital Stay (HOSPITAL_BASED_OUTPATIENT_CLINIC_OR_DEPARTMENT_OTHER): Payer: Managed Care, Other (non HMO) | Admitting: Hematology

## 2021-03-10 DIAGNOSIS — C187 Malignant neoplasm of sigmoid colon: Secondary | ICD-10-CM | POA: Diagnosis not present

## 2021-03-10 DIAGNOSIS — C50511 Malignant neoplasm of lower-outer quadrant of right female breast: Secondary | ICD-10-CM

## 2021-03-10 DIAGNOSIS — Z170 Estrogen receptor positive status [ER+]: Secondary | ICD-10-CM | POA: Diagnosis not present

## 2021-03-10 NOTE — Telephone Encounter (Signed)
LVM reminding pt of her telephone visit with Dr. Burr Medico today at 12:20pm.

## 2021-03-10 NOTE — Progress Notes (Signed)
[place]   Telephone:([phone]) [phone] Fax:(336) [phone]   Clinic Follow up Note   Patient Care Team: [name], [place] as PCP - General (Nurse Practitioner) [name], MD as Consulting Physician (General Surgery) Magrinat, [name], MD as Consulting Physician (Oncology) [name], MD as Consulting Physician (Radiation Oncology) [name], RN as Registered Nurse [name], MD as Referring Physician (Dermatology) [name], RN (Inactive) as Oncology Nurse Navigator [name], MD as Consulting Physician (Hematology) [name], MD as Consulting Physician (General Surgery) [name], MD as Consulting Physician (Hematology) Kristeen Miss, MD as Consulting Physician (Neurosurgery)  Date of Service:  03/10/2021  I connected with [name] on 03/10/2021 at 12:20 PM EST by telephone visit and verified that I am speaking with the correct person using two identifiers.  I discussed the limitations, risks, security and privacy concerns of performing an evaluation and management service by telephone and the availability of in person appointments. I also discussed with the patient that there may be a patient responsible charge related to this service. The patient expressed understanding and agreed to proceed.   Other persons participating in the visit and their role in the encounter:  patient's husband, [name]  Patient's location:  home Provider's location:  my office  CHIEF COMPLAINT: f/u of colon cancer, h/o breast cancer  CURRENT THERAPY:  Surveillance for colon cancer Anastrozole since 04/2018. Switched to Exemestane in 08/2020 due to joint pain.  ASSESSMENT & PLAN:  Sonia Sandoval is a 70 y.o. female with   1. Lung nodules -Her initial staging CT scan for colon cancer showed bilateral multiple small nodules measuring up to 4 mm, and heterogeneous airspace opacity of the right upper lobe, which we think is related to her  previous breast radiation. -Her repeated CT Chest from 04/16/20 showed her known indeterminate right upper lobe nodularity is stable, and likely benign, but there is new 1.9cm consolidation in the anterior right lower lobe. radiology feels this is possible inflammation. -Her PET from 07/01/20 showed hypermetabolic new 3.3 cm mass in anterior right lower lobe, enlarging from previous CT scan, highly concerning for malignancy. Other pulmonary nodules are stable, no hypermetabolic adenopathy. I referred to IR for biopsy, but it was cancelled by Dr. Pascal Lux due to the smaller size on biopsy CT  -Her most recent chest CT from 03/08/21 showed decrease in size of FDG-avid RLL lesion, but increase in size of some other lesions. I reviewed with the results with her today. -due to her heavy smoking history and breast and colon cancer history, I am still concerned about primary lung and metastatic disease.  -I will refer her to pulmologist Dr. Valeta Harms for evaluation of bronchoscopy biopsy    2. Colon Cancer, pT3N0M0, stage II -She was diagnosed in 08/2019. She is found to have malignant tumor in recto-sigmoid and biopsy confirmed adenocarcinoma. -She proceeded with colon surgery with Dr Dema Severin and Dr Marcello Moores on 11/22/19.  -Adjuvant chemotherapy was not recommended -GuardianReveal (circulating tumor DNA) in December 2021 was negative. Given insurance did not cover this, she is not interested in repeating this test.  -plan to repeat CT scan in 06/2021   3. H/o Right Breast Cancer, G3, T2N1, ER+/PR+/HER-, Osteopenia  -She was diagnosed in 09/2017. She was treated with B/l mastectomies by Dr Brantley Stage and adjuvant radiation by Dr Lisbeth Renshaw -She started on Anastrozole in 04/2018.  -She is being followed by Dr Jana Hakim.   -Her 04/21/20 DEXA showed osteopenia -1.8 at right  total femur, with low risk for fracture. Repeat every 2 years. -We switched her to exemestane in 08/2020 due to concerns with joint pain.    4. 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 Valeta Harms for multiple lung nodules  -f/u in 4 months with lab and CT CAP a few days prior   No problem-specific Assessment & Plan notes found for this encounter.    SUMMARY OF ONCOLOGIC HISTORY: Oncology History Overview Note  Cancer Staging Cancer of sigmoid colon [facility]) Staging form: Colon and Rectum, AJCC 8th Edition - Pathologic stage from 11/22/2019: Stage IIA (pT3, pN0, cM0) - Signed by [name], MD on 12/17/2019  Malignant neoplasm of lower-outer quadrant of right breast of female, estrogen receptor positive ([place]) Staging form: Breast, AJCC 8th Edition - Clinical: Stage IIB (cT2, cN1, cM0, G3, ER+, PR+, HER2-) - Unsigned - Pathologic: Stage IIA (pT2, pN1(sn), cM0, G3, ER+, PR+, HER2-) - Unsigned    Malignant neoplasm of lower-outer quadrant of right breast of female, estrogen receptor positive ([place])  10/23/2017 Initial Diagnosis    right breast lower outer quadrant biopsy 10/23/2017 for a clinical T2 N1, stage IIB invasive ductal carcinoma, grade 3, estrogen and progesterone receptor positive, HER-2 not amplified, with an MIB-1 of 20%   11/07/2017 Miscellaneous   Mammaprint on was read as low risk, predicting no significant chemotherapy benefit with a 5-year distant disease-free survival in the 97-98% range with hormone therapy alone   12/05/2017 Surgery   status post bilateral mastectomies on 12/05/2017 showing             (a) on the left, no evidence of malignancy             (b) on the right, a pT2 pN1, stage IIB invasive ductal carcinoma, grade 3, with negative margins. By Dr Brantley Stage   01/15/2018 - 02/28/2018 Radiation Therapy   adjuvant radiation 01/15/2018 - 02/28/2018 with Dr Lisbeth Renshaw              Site/dose: The patient initially received a dose of 50.4 Gy in 28 fractions to the chest wall and supraclavicular region. This was delivered using a 3-D conformal, 4  field technique. The patient then received a boost to the mastectomy scar. This delivered an additional 10 Gy in 5 fractions using an en face electron field. The total dose was 60.4 Gy.    04/01/2018 -  Anti-estrogen oral therapy   started anastrozole in 04/2018. Switched to Exemestane in 08/2020 due to joint pain.             (a) bone density 03/07/2018 with a T score of -1.8. Her 04/21/20 DEXA showed osteopenia -1.8 at right total femur, with low risk for fracture.   10/28/2019 Genetic Testing   Negative genetic testing.  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  The Common Hereditary Gene Panel offered by [facility] includes sequencing and/or deletion duplication testing of the following 48 genes: APC, ATM, AXIN2, BARD1, BMPR1A, BRCA1, BRCA2, BRIP1, CDH1, CDK4, CDKN2A (p14ARF), CDKN2A (p16INK4a), CHEK2, CTNNA1, DICER1, EPCAM (Deletion/duplication testing only), GREM1 (promoter region deletion/duplication testing only), KIT, MEN1, MLH1, MSH2, MSH3, MSH6, MUTYH, NBN, NF1, NHTL1, PALB2, PDGFRA, PMS2, POLD1, POLE, PTEN, RAD50, RAD51C, RAD51D, RNF43, SDHB, SDHC, SDHD, SMAD4, SMARCA4. STK11, TP53, TSC1, TSC2, and VHL.  The following genes were evaluated for sequence changes only: SDHA and HOXB13 c.251G>A variant only. The  report date is October 28, 2019.   03/08/2021 Imaging   CT Chest w/o contrast  IMPRESSION: 1. The previous FDG avid lung mass within the anterior right lower lobe has decreased in size compared with previous exam compatible with response to therapy. The cavitary lung nodule within the right apex is slightly increased in size from previous exam. Additionally, there is a new irregular nodule within the medial right upper lobe measuring 6 mm. Previously 3 mm. 2. Interval increase in size of cavitary nodule within the right lung apex. This measures 2.3 x 1.5 cm, compared with 1.3 x 0.9 cm previously. Underlying malignancy cannot be excluded. 3. New small nodule  within the right upper lobe measures 6 mm. This has a nonspecific appearance but warrants attention on follow-up imaging. 4. The remaining small pulmonary nodules noted previously are stable in the interval. 5. Coronary artery calcifications noted. 6. Aortic Atherosclerosis (ICD10-I70.0).   Cancer of sigmoid colon ([place])  09/23/2019 Tumor Marker   Baseline CEA 1.4 on 09/23/19    09/23/2019 Procedure   Colonoscopy and Upper endoscopy with Dr Alessandra Bevels 09/23/19  Upper Endoscopy Impression - Z-line regular, 35 cm from the incisors. - Small hiatal hernia. - Non-bleeding gastric ulcer. Biopsied. - Hematin (altered blood/coffee-ground-like material) in the gastric body. - Normal duodenal bulb, first portion of the duodenum and second portion of - Normal duodenal bulb, first portion of the duodenum and second portion of the duodenum.  Colonoscopy Impression - Preparation of the colon was poor. - Perianal skin tags found on perianal exam. - Likely malignant tumor in the recto-sigmoid colon. Biopsied. Tattooed. - Internal hemorrhoids.   09/23/2019 Initial Biopsy   FINAL MICROSCOPIC DIAGNOSIS: 09/23/19 A. STOMACH, ULCER, BIOPSY:  - Gastric antral mucosa with mild nonspecific reactive gastropathy  - Warthin Starry stain is negative for Helicobacter pylori   B. COLON, RECTOSIGMOID, MASS, BIOPSY:  - Adenocarcinoma, see comment  COMMENT:   B.  Immunohistochemical stains for MMR-related proteins are pending and  will be reported in an addendum. Dr. Saralyn Pilar reviewed the case and  concurs with the diagnosis. Dr. Alessandra Bevels was notified on 09/24/2019.     ADDENDUM:  Mismatch Repair Protein (IHC)  SUMMARY INTERPRETATION: NORMAL  There is preserved expression of the major MMR proteins. There is a very  low probability that microsatellite instability (MSI) is present.  However, certain clinically significant MMR protein mutations may result  in preservation of nuclear expression. It is  recommended that the  preservation of protein expression be correlated with molecular based  MSI testing.   IHC EXPRESSION RESULTS  TEST           RESULT  MLH1:          Preserved nuclear expression  MSH2:          Preserved nuclear expression  MSH6:          Preserved nuclear expression  PMS2:          Preserved nuclear expression    09/23/2019 Imaging   CT CAP W Contrast 09/23/19  IMPRESSION: 1. There is eccentric wall thickening of the rectosigmoid colon, in keeping with mass identified and biopsied by colonoscopy.   2. There are prominent subcentimeter lymph nodes in the sigmoid mesocolon, suspicious for nodal metastatic disease. There is no other lymphadenopathy in the abdomen or pelvis.   3. There are multiple small bilateral pulmonary nodules measuring up to 4 mm, nonspecific although very suspicious for pulmonary metastatic disease. Given history of breast malignancy, previous imaging  of the chest would be very helpful to assess for stability. This report may be addended for comparison if prior imaging can be obtained.   4. Status post bilateral mastectomy with irregular subpleural opacity of the anterior right upper lobe, consistent with history of breast malignancy and radiation fibrosis.   5. There is adjacent nodularity of the right pulmonary apex, the largest discrete nodular component measuring up to 1.1 cm. This is more favored infectious or inflammatory although nonspecific and metastatic disease is not excluded. As as above, prior imaging would be very helpful to assess.   6. Small focus of nonspecific infectious or inflammatory airspace disease of the perihilar left upper lobe.   09/26/2019 Initial Diagnosis   Cancer of sigmoid colon ([place])   10/28/2019 PET scan   IMPRESSION: 1. Increased tracer uptake within the pelvis corresponding to wall thickening of the rectosigmoid colon favored to represent primary colorectal carcinoma. No findings of FDG avid  nodal metastasis or solid organ metastasis within the abdomen or pelvis. 2. Within the right lung apex there is a cluster of 3 subpleural nodular densities which exhibit varying degrees of mild FDG uptake with SUV max between 1.9 and 3.1. Morphologically the appearance is suggestive of benign postinflammatory pleuroparenchymal scarring. This would be an atypical pattern of metastatic disease, which is considered less favored, but not excluded. Attention in this area on serial follow-up imaging is advised. 3. Two small, less than 5 mm left lower lobe lung nodules are too small to reliably characterize. 4.  Aortic Atherosclerosis (ICD10-I70.0). 5. Nonobstructing right renal calculus.     11/22/2019 Cancer Staging   Staging form: Colon and Rectum, AJCC 8th Edition - Pathologic stage from 11/22/2019: Stage IIA (pT3, pN0, cM0) - Signed by [name], MD on 12/17/2019    11/22/2019 Surgery   XI ROBOT ASSISTED SIGMOIDECTOMY and FLEXIBLE SIGMOIDOSCOPY by Dr white and Dr Marcello Moores   11/22/2019 Pathology Results   FINAL MICROSCOPIC DIAGNOSIS:   A. SIGMOID COLON, RESECTION:  - Invasive moderately differentiated adenocarcinoma, 4.5 cm, involving distal sigmoid colon  - Carcinoma focally invades into pericolonic soft tissue  - Resection margins are negative for carcinoma  - Negative for lymphovascular or perineural invasion  - Eighteen benign lymph nodes (0/18)  - See oncology table   B. FINAL DISTAL MARGIN, EXCISION:  - Colonic donut, negative for carcinoma    04/15/2020 Imaging   CT Chest  IMPRESSION: 1. New nodular 1.9 cm focus of consolidation in the anterior right lower lobe with surrounding mild patchy tree-in-bud opacity, nonspecific but favoring infectious/inflammatory etiology. 2. Clustered nodularity in the apical right upper lobe is stable in size and newly cavitary. These findings are indeterminate for atypical infection such as MAI versus response to therapy of pulmonary  metastases. Additional scattered tiny left pulmonary nodules are stable. Suggest close chest CT follow-up in 3 months. 3. No thoracic adenopathy. 4. Stable dilated main pulmonary artery, suggesting chronic pulmonary arterial hypertension. 5. Aortic Atherosclerosis (ICD10-I70.0).   04/17/2020 Tumor Marker   Her Guardant Reveal showed negative - ctDNA not detected.    07/08/2020 Imaging   CT Chest  IMPRESSION: Slight reduction in size of hypermetabolic right upper lobe pulmonary nodule, nonspecific though favored to represent area of infection/inflammation as was initially suggested on chest CT performed 04/15/2020. No biopsy attempted at this time.   03/08/2021 Imaging   CT Chest w/o contrast  IMPRESSION: 1. The previous FDG avid lung mass within the anterior right lower lobe has decreased in size compared with  previous exam compatible with response to therapy. The cavitary lung nodule within the right apex is slightly increased in size from previous exam. Additionally, there is a new irregular nodule within the medial right upper lobe measuring 6 mm. Previously 3 mm. 2. Interval increase in size of cavitary nodule within the right lung apex. This measures 2.3 x 1.5 cm, compared with 1.3 x 0.9 cm previously. Underlying malignancy cannot be excluded. 3. New small nodule within the right upper lobe measures 6 mm. This has a nonspecific appearance but warrants attention on follow-up imaging. 4. The remaining small pulmonary nodules noted previously are stable in the interval. 5. Coronary artery calcifications noted. 6. Aortic Atherosclerosis (ICD10-I70.0).      INTERVAL HISTORY:  YI FALLETTA was contacted for a follow up of colon cancer. She was last seen by me on 12/03/20. She reports she saw Dr. Ellene Route for her back and received a shot. She reports the shot helped her tremendously. She also notes Dr. Ellene Route also started her on gabapentin, 300 mg once at night. She reports this  initially helped her sleep as well, but this has since stopped. She also reports pain "where my [right] breast would be." She notes it comes on infrequently but can be quite painful when it does. She notes she was told it's likely secondary to radiation.    All other systems were reviewed with the patient and are negative.  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   Osteopenia 03/2018   Seen on DEXA Scan   PONV (postoperative nausea and vomiting)     SURGICAL HISTORY: Past Surgical History:  Procedure Laterality Date   BACK SURGERY  2003   Patient will occassional back pain since 2003   BIOPSY  09/23/2019   Procedure: BIOPSY;  Surgeon: Otis Brace, MD;  Location: [facility];  Service: Gastroenterology;;  EGD and COLON   BREAST BIOPSY Right 09/2017   CESAREAN SECTION  1985; 1992   COLONOSCOPY WITH PROPOFOL N/A 09/23/2019   Procedure: COLONOSCOPY WITH PROPOFOL and EGD;  Surgeon: Otis Brace, MD;  Location: [facility];  Service: Gastroenterology;  Laterality: N/A;   DILATION AND CURETTAGE OF UTERUS     ESOPHAGOGASTRODUODENOSCOPY N/A 09/23/2019   Procedure: ESOPHAGOGASTRODUODENOSCOPY (EGD);  Surgeon: Otis Brace, MD;  Location: [facility];  Service: Gastroenterology;  Laterality: N/A;   FLEXIBLE SIGMOIDOSCOPY N/A 11/22/2019   Procedure: FLEXIBLE SIGMOIDOSCOPY;  Surgeon: [name], MD;  Location: [facility];  Service: General;  Laterality: N/A;   LUMBAR [place] SURGERY  2003   MASTECTOMY Left 12/05/2017   PROPHYLACTIC MASTECTOMY   MASTECTOMY COMPLETE / SIMPLE W/  SENTINEL NODE BIOPSY Right 12/05/2017   WITH RADIOACTIVE SEED TARGETED RIGHT AXIILARY LYMPH NODE EXCISION AND RIGHT SENTINEL LYMPH NODE BIOPSY,    MASTECTOMY WITH RADIOACTIVE SEED GUIDED EXCISION AND AXILLARY SENTINEL LYMPH NODE BIOPSY Bilateral 12/05/2017   Procedure: RIGHT SIMPLE MASTECTOMY WITH RADIOACTIVE SEED TARGETED RIGHT AXIILARY LYMPH NODE EXCISION AND RIGHT SENTINEL LYMPH NODE BIOPSY, LEFT PROPHYLACTIC MASTECTOMY;  Surgeon: [name], MD;  Location:  [facility] OR;  Service: General;  Laterality: Bilateral;   SUBMUCOSAL TATTOO INJECTION  09/23/2019   Procedure: SUBMUCOSAL TATTOO INJECTION;  Surgeon: Otis Brace, MD;  Location: [facility];  Service: Gastroenterology;;    I have reviewed the social history and family history with the patient and they are unchanged from previous note.  ALLERGIES:  is allergic to codeine.  MEDICATIONS:  Current Outpatient Medications  Medication Sig Dispense Refill   Calcium Carb-Cholecalciferol (CALCIUM 600 + D PO) Take 1 tablet by mouth daily.     cholecalciferol (VITAMIN D3) 25 MCG (1000 UNIT) tablet Take 1,000 Units by mouth daily.     exemestane (AROMASIN) 25 MG tablet TAKE 1 TABLET (25 MG TOTAL) BY MOUTH DAILY AFTER BREAKFAST. 90 tablet 3   Multiple Vitamin (MULTI-VITAMIN) tablet Take 1 tablet by mouth daily.     Multiple Vitamins-Minerals (HAIR/SKIN/NAILS) CAPS Take 3 capsules by mouth daily.     naproxen sodium (ALEVE) 220 MG tablet Take 220 mg by mouth daily as needed (pain).     pantoprazole (PROTONIX) 40 MG tablet Take 40 mg by mouth every morning.     Prenatal MV & Min w/FA-DHA (PRENATAL GUMMIES PO) Take 3 tablets by mouth daily.     Pseudoephedrine-Naproxen Na (ALEVE-D SINUS & HEADACHE PO) Take 1 tablet by mouth daily as needed (allergies).     tiZANidine (ZANAFLEX) 4 MG tablet Take 2-4 mg by mouth at bedtime as needed.     vitamin E 180 MG (400 UNITS) capsule Take 400 Units by mouth daily.     No current facility-administered  medications for this visit.    PHYSICAL EXAMINATION: ECOG PERFORMANCE STATUS: 0 - Asymptomatic  There were no vitals filed for this visit. Wt Readings from Last 3 Encounters:  02/23/21 146 lb (66.2 kg)  12/03/20 146 lb 4.8 oz (66.4 kg)  09/02/20 143 lb 6.4 oz (65 kg)     No vitals taken today, Exam not performed today  LABORATORY DATA:  I have reviewed the data as listed CBC Latest Ref Rng & Units 03/08/2021 02/23/2021 12/03/2020  WBC 4.0 - 10.5 K/uL 3.8(L) 4.1 6.0  Hemoglobin 12.0 - 15.0 g/dL 12.4 13.1 12.2  Hematocrit 36.0 - 46.0 % 36.9 39.8 35.8(L)  Platelets 150 - 400 K/uL 216 257 261     CMP Latest Ref Rng & Units 03/08/2021 02/23/2021 12/03/2020  Glucose 70 - 99 mg/dL 94 72 122(H)  BUN 8 - 23 mg/dL '13 13 14  ' Creatinine 0.44 - 1.00 mg/dL 0.79 0.77 0.73  Sodium 135 - 145 mmol/L 140 141 138  Potassium 3.5 - 5.1 mmol/L 3.6 3.8 4.0  Chloride 98 - 111 mmol/L 107 106 106  CO2 22 - 32 mmol/L '25 25 22  ' Calcium 8.9 - 10.3 mg/dL 9.0 9.6 9.3  Total Protein 6.5 - 8.1 g/dL 6.8 6.8 6.5  Total Bilirubin 0.3 - 1.2 mg/dL 0.4 0.6 0.3  Alkaline Phos 38 - 126 U/L 66 62 80  AST 15 - 41 U/L '29 22 30  ' ALT 0 - 44 U/L 23 22 39      RADIOGRAPHIC STUDIES: I have personally reviewed the radiological images as listed and agreed with the findings in the report. No results found.    Orders Placed This Encounter  Procedures   CT CHEST ABDOMEN PELVIS W CONTRAST    Standing Status:   Future    Standing Expiration Date:   03/10/2022    Order Specific Question:   Preferred imaging location?    Answer:  [facility]    Order Specific Question:   Is Oral Contrast requested for this exam?    Answer:   Yes, Per Radiology protocol   Ambulatory referral to Pulmonology    Referral Priority:   Routine    Referral Type:   Consultation    Referral Reason:   Specialty Services Required    Requested Specialty:   Pulmonary Disease    Number of Visits Requested:   1    All questions were  answered. The patient knows to call the clinic with any problems, questions or concerns. No barriers to learning was detected. The total time spent in the appointment was 22 minutes.     [name], MD 03/10/2021   I, Wilburn Mylar, am acting as scribe for [name], MD.   I have reviewed the above documentation for accuracy and completeness, and I agree with the above.

## 2021-03-11 ENCOUNTER — Telehealth: Payer: Self-pay | Admitting: Hematology

## 2021-03-11 NOTE — Telephone Encounter (Signed)
Scheduled follow-up appointments per 1/19 los. Patient is aware. °

## 2021-03-15 ENCOUNTER — Encounter: Payer: Self-pay | Admitting: Hematology

## 2021-03-22 ENCOUNTER — Encounter: Payer: Self-pay | Admitting: Hematology

## 2021-03-30 ENCOUNTER — Ambulatory Visit: Payer: Medicare Other | Admitting: Hematology

## 2021-04-01 ENCOUNTER — Telehealth: Payer: Self-pay

## 2021-04-01 NOTE — Telephone Encounter (Signed)
This nurse reached out to patient and attempted to schedule a lab appointment to have [place] test completed.  Patient states that she does not want to repeat this test because it is not covered by her insurance and the cost of the test is more than she can pay out of pocket.  This information has been forwarded to MD. No further questions or concerns at this time.

## 2021-06-02 ENCOUNTER — Encounter: Payer: Self-pay | Admitting: Hematology

## 2021-06-05 ENCOUNTER — Encounter: Payer: Self-pay | Admitting: Hematology

## 2021-06-09 ENCOUNTER — Institutional Professional Consult (permissible substitution): Payer: Managed Care, Other (non HMO) | Admitting: Pulmonary Disease

## 2021-06-28 ENCOUNTER — Encounter: Payer: Self-pay | Admitting: Hematology

## 2021-07-05 ENCOUNTER — Ambulatory Visit (HOSPITAL_COMMUNITY): Payer: Managed Care, Other (non HMO)

## 2021-07-05 ENCOUNTER — Other Ambulatory Visit: Payer: Medicare Other

## 2021-07-07 ENCOUNTER — Ambulatory Visit: Payer: Medicare Other | Admitting: Hematology

## 2021-07-30 ENCOUNTER — Encounter: Payer: Self-pay | Admitting: Hematology

## 2021-08-04 ENCOUNTER — Ambulatory Visit (INDEPENDENT_AMBULATORY_CARE_PROVIDER_SITE_OTHER): Payer: Commercial Managed Care - PPO | Admitting: Pulmonary Disease

## 2021-08-04 ENCOUNTER — Encounter: Payer: Self-pay | Admitting: Pulmonary Disease

## 2021-08-04 VITALS — BP 128/72 | HR 77 | Temp 98.1°F | Ht 63.0 in | Wt 144.4 lb

## 2021-08-04 DIAGNOSIS — Z85038 Personal history of other malignant neoplasm of large intestine: Secondary | ICD-10-CM

## 2021-08-04 DIAGNOSIS — J984 Other disorders of lung: Secondary | ICD-10-CM

## 2021-08-04 DIAGNOSIS — Z170 Estrogen receptor positive status [ER+]: Secondary | ICD-10-CM

## 2021-08-04 DIAGNOSIS — C50511 Malignant neoplasm of lower-outer quadrant of right female breast: Secondary | ICD-10-CM | POA: Diagnosis not present

## 2021-08-04 DIAGNOSIS — Z853 Personal history of malignant neoplasm of breast: Secondary | ICD-10-CM

## 2021-08-04 NOTE — H&P (View-Only) (Signed)
? ?Synopsis: Referred in April 2023 for lung nodule by [name], MD ? ?Subjective:  ? ?PATIENT ID: Sonia Sandoval GENDER: female DOB: 1950-11-23, MRN: [id number] ? ?Chief Complaint  ?Patient presents with  ? Pulmonary Consult  ?  Referred by Dr Burr Medico for eval of pulmonary nodules. She has occ SOB, mainly with exertion with occ just at rest. She has occ cough that is non prod.   ? ? 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  Presents for evaluation of a right upper lobe cavitary lesion.  She denies cough sputum production.  No incarceration, no travel outside of the country.  No known exposures to TB.  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  She is here today for follow-up of this lung nodule.  She has a CT scan scheduled for Friday.  She was going to see me last fall but there was plans for changing of her insurance and so she pushed off the appointment until the following year.  And so she is here today to talk about this she does have a follow-up appointment with medical oncology next week. ? ? ?Oncology History Overview Note  ?Cancer Staging ?Cancer of sigmoid colon ([place]) ?Staging form: Colon and Rectum, AJCC 8th Edition ?- Pathologic stage from 11/22/2019: Stage IIA (pT3, pN0, cM0) - Signed by [name], MD on 12/17/2019 ? ?Malignant neoplasm of lower-outer quadrant of right breast of female, estrogen receptor positive ([place]) ?Staging form: Breast, AJCC 8th Edition ?- Clinical: Stage IIB (cT2, cN1, cM0, G3, ER+, PR+, HER2-) - Unsigned ?- Pathologic: Stage IIA (pT2, pN1(sn), cM0, G3, ER+, PR+, HER2-) - Unsigned ? ?  ?Malignant neoplasm of lower-outer quadrant of right breast of female, estrogen receptor positive ([place])  ?10/23/2017 Initial Diagnosis  ?  right breast lower outer quadrant biopsy 10/23/2017 for a clinical T2 N1, stage IIB  invasive ductal carcinoma, grade 3, estrogen and progesterone receptor positive, HER-2 not amplified, with an MIB-1 of 20% ?  ?11/07/2017 Miscellaneous  ? Mammaprint on was read as low risk, predicting no significant chemotherapy benefit with a 5-year distant disease-free survival in the 97-98% range with hormone therapy alone ?  ?12/05/2017 Surgery  ? status post bilateral mastectomies on 12/05/2017 showing ?            (a) on the left, no evidence of malignancy ?            (b) on the right, a pT2 pN1, stage IIB invasive ductal carcinoma, grade 3, with negative margins. ?By Dr Brantley Stage ?  ?01/15/2018 - 02/28/2018 Radiation Therapy  ? adjuvant radiation 01/15/2018 - 02/28/2018 with Dr Lisbeth Renshaw  ?            Site/dose: The patient initially received a dose of 50.4 Gy in 28 fractions to the chest wall and supraclavicular region. This was delivered using a 3-D conformal, 4 field technique. The patient then received a boost to the mastectomy scar. This delivered an additional 10 Gy in 5 fractions using an en face electron field. The total dose was 60.4 Gy.  ?  ?04/01/2018 -  Anti-estrogen oral therapy  ? started anastrozole in 04/2018. Switched to Exemestane in 08/2020 due to joint pain. ?            (a) bone density 03/07/2018 with a T score of -  1.8. Her 04/21/20 DEXA showed osteopenia -1.8 at right total femur, with low risk for fracture. ?  ?10/28/2019 Genetic Testing  ? Negative genetic testing.  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  The Common Hereditary Gene Panel offered by [facility] includes sequencing and/or deletion duplication testing of the following 48 genes: APC, ATM, AXIN2, BARD1, BMPR1A, BRCA1, BRCA2, BRIP1, CDH1, CDK4, CDKN2A (p14ARF), CDKN2A (p16INK4a), CHEK2, CTNNA1, DICER1, EPCAM (Deletion/duplication testing only), GREM1 (promoter region deletion/duplication testing only), KIT, MEN1, MLH1, MSH2, MSH3, MSH6, MUTYH, NBN, NF1, NHTL1, PALB2, PDGFRA, PMS2, POLD1, POLE, PTEN, RAD50,  RAD51C, RAD51D, RNF43, SDHB, SDHC, SDHD, SMAD4, SMARCA4. STK11, TP53, TSC1, TSC2, and VHL.  The following genes were evaluated for sequence changes only: SDHA and HOXB13 c.251G>A variant only. The report date is October 28, 2019. ?  ?03/08/2021 Imaging  ? CT Chest w/o contrast ? ?IMPRESSION: ?1. The previous FDG avid lung mass within the anterior right lower ?lobe has decreased in size compared with previous exam compatible with response to therapy. The cavitary lung nodule within the right apex is slightly increased in size from previous exam. Additionally, there is a new irregular nodule within the medial right upper lobe measuring 6 mm. Previously 3 mm. ?2. Interval increase in size of cavitary nodule within the right ?lung apex. This measures 2.3 x 1.5 cm, compared with 1.3 x 0.9 cm previously. Underlying malignancy cannot be excluded. ?3. New small nodule within the right upper lobe measures 6 mm. This has a nonspecific appearance but warrants attention on follow-up imaging. ?4. The remaining small pulmonary nodules noted previously are stable in the interval. ?5. Coronary artery calcifications noted. ?6. Aortic Atherosclerosis (ICD10-I70.0). ?  ?Cancer of sigmoid colon ([place])  ?09/23/2019 Tumor Marker  ? Baseline CEA 1.4 on 09/23/19  ?  ?09/23/2019 Procedure  ? Colonoscopy and Upper endoscopy with Dr Alessandra Bevels 09/23/19  ?Upper Endoscopy Impression ?- Z-line regular, 35 cm from the incisors. ?- Small hiatal hernia. ?- Non-bleeding gastric ulcer. Biopsied. ?- Hematin (altered blood/coffee-ground-like material) in the gastric body. ?- Normal duodenal bulb, first portion of the duodenum and second portion of ?- Normal duodenal bulb, first portion of the duodenum and second portion of the ?duodenum. ? ?Colonoscopy Impression ?- Preparation of the colon was poor. ?- Perianal skin tags found on perianal exam. ?- Likely malignant tumor in the recto-sigmoid colon. Biopsied. Tattooed. ?- Internal hemorrhoids. ?  ?09/23/2019  Initial Biopsy  ? FINAL MICROSCOPIC DIAGNOSIS: 09/23/19 ?A. STOMACH, ULCER, BIOPSY:  ?- Gastric antral mucosa with mild nonspecific reactive gastropathy  ?- Warthin Starry stain is negative for Helicobacter pylori  ? ?B. COLON, RECTOSIGMOID, MASS, BIOPSY:  ?- Adenocarcinoma, see comment  ?COMMENT:  ? ?B.  Immunohistochemical stains for MMR-related proteins are pending and  ?will be reported in an addendum. Dr. Saralyn Pilar reviewed the case and  ?concurs with the diagnosis. Dr. Alessandra Bevels was notified on 09/24/2019.  ? ? ? ?ADDENDUM:  ?Mismatch Repair Protein ([facility])  ?SUMMARY INTERPRETATION: NORMAL  ?There is preserved expression of the major MMR proteins. There is a very  ?low probability that microsatellite instability (MSI) is present.  ?However, certain clinically significant MMR protein mutations may result  ?in preservation of nuclear expression. It is recommended that the  ?preservation of protein expression be correlated with molecular based  ?MSI testing.  ? ?IHC EXPRESSION RESULTS  ?TEST           RESULT  ?MLH1:          Preserved nuclear expression  ?  MSH2:          Preserved nuclear expression  ?MSH6:          Preserved nuclear expression  ?PMS2:          Preserved nuclear expression  ?  ?09/23/2019 Imaging  ? CT CAP W Contrast 09/23/19  ?IMPRESSION: ?1. There is eccentric wall thickening of the rectosigmoid colon, in ?keeping with mass identified and biopsied by colonoscopy. ?  ?2. There are prominent subcentimeter lymph nodes in the sigmoid ?mesocolon, suspicious for nodal metastatic disease. There is no ?other lymphadenopathy in the abdomen or pelvis. ?  ?3. There are multiple small bilateral pulmonary nodules measuring up ?to 4 mm, nonspecific although very suspicious for pulmonary ?metastatic disease. Given history of breast malignancy, previous ?imaging of the chest would be very helpful to assess for stability. ?This report may be addended for comparison if prior imaging can be ?obtained. ?  ?4. 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 ?  ?5. There is adjacent nodularity of the right

## 2021-08-04 NOTE — Progress Notes (Signed)
? ?Synopsis: Referred in April 2023 for lung nodule by [name], MD ? ?Subjective:  ? ?PATIENT ID: Sonia Sandoval GENDER: female DOB: 1950/11/14, MRN: [id number] ? ?Chief Complaint  ?Patient presents with  ? Pulmonary Consult  ?  Referred by Dr Burr Medico for eval of pulmonary nodules. She has occ SOB, mainly with exertion with occ just at rest. She has occ cough that is non prod.   ? ? 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  Presents for evaluation of a right upper lobe cavitary lesion.  She denies cough sputum production.  No incarceration, no travel outside of the country.  No known exposures to TB.  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  She is here today for follow-up of this lung nodule.  She has a CT scan scheduled for Friday.  She was going to see me last fall but there was plans for changing of her insurance and so she pushed off the appointment until the following year.  And so she is here today to talk about this she does have a follow-up appointment with medical oncology next week. ? ? ?Oncology History Overview Note  ?Cancer Staging ?Cancer of sigmoid colon ([place]) ?Staging form: Colon and Rectum, AJCC 8th Edition ?- Pathologic stage from 11/22/2019: Stage IIA (pT3, pN0, cM0) - Signed by [name], MD on 12/17/2019 ? ?Malignant neoplasm of lower-outer quadrant of right breast of female, estrogen receptor positive ([place]) ?Staging form: Breast, AJCC 8th Edition ?- Clinical: Stage IIB (cT2, cN1, cM0, G3, ER+, PR+, HER2-) - Unsigned ?- Pathologic: Stage IIA (pT2, pN1(sn), cM0, G3, ER+, PR+, HER2-) - Unsigned ? ?  ?Malignant neoplasm of lower-outer quadrant of right breast of female, estrogen receptor positive ([place])  ?10/23/2017 Initial Diagnosis  ?  right breast lower outer quadrant biopsy 10/23/2017 for a clinical T2 N1, stage IIB  invasive ductal carcinoma, grade 3, estrogen and progesterone receptor positive, HER-2 not amplified, with an MIB-1 of 20% ?  ?11/07/2017 Miscellaneous  ? Mammaprint on was read as low risk, predicting no significant chemotherapy benefit with a 5-year distant disease-free survival in the 97-98% range with hormone therapy alone ?  ?12/05/2017 Surgery  ? status post bilateral mastectomies on 12/05/2017 showing ?            (a) on the left, no evidence of malignancy ?            (b) on the right, a pT2 pN1, stage IIB invasive ductal carcinoma, grade 3, with negative margins. ?By Dr Brantley Stage ?  ?01/15/2018 - 02/28/2018 Radiation Therapy  ? adjuvant radiation 01/15/2018 - 02/28/2018 with Dr Lisbeth Renshaw  ?            Site/dose: The patient initially received a dose of 50.4 Gy in 28 fractions to the chest wall and supraclavicular region. This was delivered using a 3-D conformal, 4 field technique. The patient then received a boost to the mastectomy scar. This delivered an additional 10 Gy in 5 fractions using an en face electron field. The total dose was 60.4 Gy.  ?  ?04/01/2018 -  Anti-estrogen oral therapy  ? started anastrozole in 04/2018. Switched to Exemestane in 08/2020 due to joint pain. ?            (a) bone density 03/07/2018 with a T score of -  1.8. Her 04/21/20 DEXA showed osteopenia -1.8 at right total femur, with low risk for fracture. ?  ?10/28/2019 Genetic Testing  ? Negative genetic testing.  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  The Common Hereditary Gene Panel offered by [facility] includes sequencing and/or deletion duplication testing of the following 48 genes: APC, ATM, AXIN2, BARD1, BMPR1A, BRCA1, BRCA2, BRIP1, CDH1, CDK4, CDKN2A (p14ARF), CDKN2A (p16INK4a), CHEK2, CTNNA1, DICER1, EPCAM (Deletion/duplication testing only), GREM1 (promoter region deletion/duplication testing only), KIT, MEN1, MLH1, MSH2, MSH3, MSH6, MUTYH, NBN, NF1, NHTL1, PALB2, PDGFRA, PMS2, POLD1, POLE, PTEN, RAD50,  RAD51C, RAD51D, RNF43, SDHB, SDHC, SDHD, SMAD4, SMARCA4. STK11, TP53, TSC1, TSC2, and VHL.  The following genes were evaluated for sequence changes only: SDHA and HOXB13 c.251G>A variant only. The report date is October 28, 2019. ?  ?03/08/2021 Imaging  ? CT Chest w/o contrast ? ?IMPRESSION: ?1. The previous FDG avid lung mass within the anterior right lower ?lobe has decreased in size compared with previous exam compatible with response to therapy. The cavitary lung nodule within the right apex is slightly increased in size from previous exam. Additionally, there is a new irregular nodule within the medial right upper lobe measuring 6 mm. Previously 3 mm. ?2. Interval increase in size of cavitary nodule within the right ?lung apex. This measures 2.3 x 1.5 cm, compared with 1.3 x 0.9 cm previously. Underlying malignancy cannot be excluded. ?3. New small nodule within the right upper lobe measures 6 mm. This has a nonspecific appearance but warrants attention on follow-up imaging. ?4. The remaining small pulmonary nodules noted previously are stable in the interval. ?5. Coronary artery calcifications noted. ?6. Aortic Atherosclerosis (ICD10-I70.0). ?  ?Cancer of sigmoid colon ([place])  ?09/23/2019 Tumor Marker  ? Baseline CEA 1.4 on 09/23/19  ?  ?09/23/2019 Procedure  ? Colonoscopy and Upper endoscopy with Dr Alessandra Bevels 09/23/19  ?Upper Endoscopy Impression ?- Z-line regular, 35 cm from the incisors. ?- Small hiatal hernia. ?- Non-bleeding gastric ulcer. Biopsied. ?- Hematin (altered blood/coffee-ground-like material) in the gastric body. ?- Normal duodenal bulb, first portion of the duodenum and second portion of ?- Normal duodenal bulb, first portion of the duodenum and second portion of the ?duodenum. ? ?Colonoscopy Impression ?- Preparation of the colon was poor. ?- Perianal skin tags found on perianal exam. ?- Likely malignant tumor in the recto-sigmoid colon. Biopsied. Tattooed. ?- Internal hemorrhoids. ?  ?09/23/2019  Initial Biopsy  ? FINAL MICROSCOPIC DIAGNOSIS: 09/23/19 ?A. STOMACH, ULCER, BIOPSY:  ?- Gastric antral mucosa with mild nonspecific reactive gastropathy  ?- Warthin Starry stain is negative for Helicobacter pylori  ? ?B. COLON, RECTOSIGMOID, MASS, BIOPSY:  ?- Adenocarcinoma, see comment  ?COMMENT:  ? ?B.  Immunohistochemical stains for MMR-related proteins are pending and  ?will be reported in an addendum. Dr. Saralyn Pilar reviewed the case and  ?concurs with the diagnosis. Dr. Alessandra Bevels was notified on 09/24/2019.  ? ? ? ?ADDENDUM:  ?Mismatch Repair Protein ([facility])  ?SUMMARY INTERPRETATION: NORMAL  ?There is preserved expression of the major MMR proteins. There is a very  ?low probability that microsatellite instability (MSI) is present.  ?However, certain clinically significant MMR protein mutations may result  ?in preservation of nuclear expression. It is recommended that the  ?preservation of protein expression be correlated with molecular based  ?MSI testing.  ? ?IHC EXPRESSION RESULTS  ?TEST           RESULT  ?MLH1:          Preserved nuclear expression  ?  MSH2:          Preserved nuclear expression  ?MSH6:          Preserved nuclear expression  ?PMS2:          Preserved nuclear expression  ?  ?09/23/2019 Imaging  ? CT CAP W Contrast 09/23/19  ?IMPRESSION: ?1. There is eccentric wall thickening of the rectosigmoid colon, in ?keeping with mass identified and biopsied by colonoscopy. ?  ?2. There are prominent subcentimeter lymph nodes in the sigmoid ?mesocolon, suspicious for nodal metastatic disease. There is no ?other lymphadenopathy in the abdomen or pelvis. ?  ?3. There are multiple small bilateral pulmonary nodules measuring up ?to 4 mm, nonspecific although very suspicious for pulmonary ?metastatic disease. Given history of breast malignancy, previous ?imaging of the chest would be very helpful to assess for stability. ?This report may be addended for comparison if prior imaging can be ?obtained. ?  ?4. 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 ?  ?5. There is adjacent nodularity of the right

## 2021-08-04 NOTE — Patient Instructions (Addendum)
Thank you for visiting Dr. Valeta Harms at [facility]. ?Today we recommend the following: ? ?I will review your images on Friday  ?Likely plan for bronchoscopy on 08/24/2021 ? ?Return in about 3 months (around 11/03/2021). ? ? ? ?Please do your part to reduce the spread of COVID-19.  ?' ?

## 2021-08-05 ENCOUNTER — Other Ambulatory Visit: Payer: Self-pay

## 2021-08-05 DIAGNOSIS — Z170 Estrogen receptor positive status [ER+]: Secondary | ICD-10-CM

## 2021-08-05 DIAGNOSIS — C187 Malignant neoplasm of sigmoid colon: Secondary | ICD-10-CM

## 2021-08-06 ENCOUNTER — Inpatient Hospital Stay: Payer: Commercial Managed Care - PPO | Attending: Hematology

## 2021-08-06 ENCOUNTER — Ambulatory Visit (HOSPITAL_COMMUNITY)
Admission: RE | Admit: 2021-08-06 | Discharge: 2021-08-06 | Disposition: A | Discharge status: Home / Self Care | Payer: Commercial Managed Care - PPO | Source: Ambulatory Visit | Attending: Hematology | Admitting: Hematology

## 2021-08-06 ENCOUNTER — Other Ambulatory Visit: Payer: Self-pay

## 2021-08-06 DIAGNOSIS — R059 Cough, unspecified: Secondary | ICD-10-CM | POA: Insufficient documentation

## 2021-08-06 DIAGNOSIS — C187 Malignant neoplasm of sigmoid colon: Secondary | ICD-10-CM | POA: Insufficient documentation

## 2021-08-06 DIAGNOSIS — J984 Other disorders of lung: Secondary | ICD-10-CM | POA: Insufficient documentation

## 2021-08-06 DIAGNOSIS — C779 Secondary and unspecified malignant neoplasm of lymph node, unspecified: Secondary | ICD-10-CM | POA: Insufficient documentation

## 2021-08-06 DIAGNOSIS — I2510 Atherosclerotic heart disease of native coronary artery without angina pectoris: Secondary | ICD-10-CM | POA: Insufficient documentation

## 2021-08-06 DIAGNOSIS — Z79811 Long term (current) use of aromatase inhibitors: Secondary | ICD-10-CM | POA: Insufficient documentation

## 2021-08-06 DIAGNOSIS — C7800 Secondary malignant neoplasm of unspecified lung: Secondary | ICD-10-CM | POA: Insufficient documentation

## 2021-08-06 DIAGNOSIS — C19 Malignant neoplasm of rectosigmoid junction: Secondary | ICD-10-CM | POA: Insufficient documentation

## 2021-08-06 DIAGNOSIS — Z170 Estrogen receptor positive status [ER+]: Secondary | ICD-10-CM | POA: Insufficient documentation

## 2021-08-06 DIAGNOSIS — Z9013 Acquired absence of bilateral breasts and nipples: Secondary | ICD-10-CM | POA: Insufficient documentation

## 2021-08-06 DIAGNOSIS — R0602 Shortness of breath: Secondary | ICD-10-CM | POA: Insufficient documentation

## 2021-08-06 DIAGNOSIS — C50511 Malignant neoplasm of lower-outer quadrant of right female breast: Secondary | ICD-10-CM | POA: Insufficient documentation

## 2021-08-06 DIAGNOSIS — M25551 Pain in right hip: Secondary | ICD-10-CM | POA: Insufficient documentation

## 2021-08-06 DIAGNOSIS — Z79899 Other long term (current) drug therapy: Secondary | ICD-10-CM | POA: Insufficient documentation

## 2021-08-06 DIAGNOSIS — I700 Atherosclerosis of aorta: Secondary | ICD-10-CM | POA: Insufficient documentation

## 2021-08-06 DIAGNOSIS — Z885 Allergy status to narcotic agent status: Secondary | ICD-10-CM | POA: Insufficient documentation

## 2021-08-06 DIAGNOSIS — Z923 Personal history of irradiation: Secondary | ICD-10-CM | POA: Insufficient documentation

## 2021-08-06 LAB — CMP (CANCER CENTER ONLY)
ALT: 25 U/L (ref 0–44)
AST: 24 U/L (ref 15–41)
Albumin: 4 g/dL (ref 3.5–5.0)
Alkaline Phosphatase: 96 U/L (ref 38–126)
Anion gap: 7 (ref 5–15)
BUN: 15 mg/dL (ref 8–23)
CO2: 29 mmol/L (ref 22–32)
Calcium: 9.3 mg/dL (ref 8.9–10.3)
Chloride: 102 mmol/L (ref 98–111)
Creatinine: 0.75 mg/dL (ref 0.44–1.00)
GFR, Estimated: >60 mL/min (ref >60)
Glucose, Bld: 89 mg/dL (ref 70–99)
Potassium: 4 mmol/L (ref 3.5–5.1)
Sodium: 138 mmol/L (ref 135–145)
Total Bilirubin: 0.4 mg/dL (ref 0.3–1.2)
Total Protein: 7.1 g/dL (ref 6.5–8.1)

## 2021-08-06 LAB — CBC WITH DIFFERENTIAL (CANCER CENTER ONLY)
Abs Immature Granulocytes: 0.01 10*3/uL (ref 0.00–0.07)
Basophils Absolute: 0 10*3/uL (ref 0.0–0.1)
Basophils Relative: 0 %
Eosinophils Absolute: 0.1 10*3/uL (ref 0.0–0.5)
Eosinophils Relative: 2 %
HCT: 37.3 % (ref 36.0–46.0)
Hemoglobin: 12.3 g/dL (ref 12.0–15.0)
Immature Granulocytes: 0 %
Lymphocytes Relative: 17 %
Lymphs Abs: 0.9 10*3/uL (ref 0.7–4.0)
MCH: 29.4 pg (ref 26.0–34.0)
MCHC: 33 g/dL (ref 30.0–36.0)
MCV: 89 fL (ref 80.0–100.0)
Monocytes Absolute: 0.4 10*3/uL (ref 0.1–1.0)
Monocytes Relative: 8 %
Neutro Abs: 3.9 10*3/uL (ref 1.7–7.7)
Neutrophils Relative %: 73 %
Platelet Count: 281 10*3/uL (ref 150–400)
RBC: 4.19 MIL/uL (ref 3.87–5.11)
RDW: 13 % (ref 11.5–15.5)
WBC Count: 5.3 10*3/uL (ref 4.0–10.5)
nRBC: 0 % (ref 0.0–0.2)

## 2021-08-06 LAB — QUANTIFERON-TB GOLD PLUS
Mitogen-NIL: 9.08 IU/mL
NIL: 0.01 IU/mL
QuantiFERON-TB Gold Plus: NEGATIVE
TB1-NIL: 0.01 IU/mL
TB2-NIL: 0.01 IU/mL

## 2021-08-06 MED ORDER — SODIUM CHLORIDE (PF) 0.9 % IJ SOLN
INTRAMUSCULAR | Status: AC
  Filled 2021-08-06: qty 50

## 2021-08-06 MED ORDER — IOHEXOL 300 MG/ML  SOLN
100.0000 mL | Freq: Once | INTRAMUSCULAR | Status: AC | PRN
  Administered 2021-08-06: 100 mL via INTRAVENOUS

## 2021-08-07 ENCOUNTER — Telehealth: Payer: Self-pay | Admitting: Pulmonary Disease

## 2021-08-07 DIAGNOSIS — J984 Other disorders of lung: Secondary | ICD-10-CM

## 2021-08-07 NOTE — Telephone Encounter (Signed)
PCCM: ? ?I have reviewed her recent ct chest completed on Friday.  ?We will schedule her bronchoscopy for 04/25 or 05/02 which ever is best to the patient.  ? ?Thanks ? ?BLI  ? ?Garner Nash, DO ?[facility] ?08/07/2021 3:17 PM   ? ? ? ?

## 2021-08-08 DIAGNOSIS — J984 Other disorders of lung: Secondary | ICD-10-CM | POA: Insufficient documentation

## 2021-08-12 ENCOUNTER — Encounter: Payer: Self-pay | Admitting: Hematology

## 2021-08-12 ENCOUNTER — Telehealth: Payer: Self-pay | Admitting: *Deleted

## 2021-08-12 ENCOUNTER — Inpatient Hospital Stay (HOSPITAL_BASED_OUTPATIENT_CLINIC_OR_DEPARTMENT_OTHER): Payer: Commercial Managed Care - PPO | Admitting: Hematology

## 2021-08-12 ENCOUNTER — Telehealth: Payer: Self-pay | Admitting: Pulmonary Disease

## 2021-08-12 ENCOUNTER — Other Ambulatory Visit: Payer: Self-pay

## 2021-08-12 VITALS — BP 128/62 | HR 84 | Temp 98.1°F | Ht 63.0 in | Wt 145.9 lb

## 2021-08-12 DIAGNOSIS — C187 Malignant neoplasm of sigmoid colon: Secondary | ICD-10-CM | POA: Diagnosis not present

## 2021-08-12 DIAGNOSIS — Z9013 Acquired absence of bilateral breasts and nipples: Secondary | ICD-10-CM | POA: Insufficient documentation

## 2021-08-12 DIAGNOSIS — I700 Atherosclerosis of aorta: Secondary | ICD-10-CM | POA: Diagnosis not present

## 2021-08-12 DIAGNOSIS — J984 Other disorders of lung: Secondary | ICD-10-CM | POA: Diagnosis not present

## 2021-08-12 DIAGNOSIS — C50511 Malignant neoplasm of lower-outer quadrant of right female breast: Secondary | ICD-10-CM | POA: Diagnosis not present

## 2021-08-12 DIAGNOSIS — C19 Malignant neoplasm of rectosigmoid junction: Secondary | ICD-10-CM | POA: Insufficient documentation

## 2021-08-12 DIAGNOSIS — Z79899 Other long term (current) drug therapy: Secondary | ICD-10-CM | POA: Diagnosis not present

## 2021-08-12 DIAGNOSIS — M25551 Pain in right hip: Secondary | ICD-10-CM | POA: Insufficient documentation

## 2021-08-12 DIAGNOSIS — C7800 Secondary malignant neoplasm of unspecified lung: Secondary | ICD-10-CM | POA: Insufficient documentation

## 2021-08-12 DIAGNOSIS — Z170 Estrogen receptor positive status [ER+]: Secondary | ICD-10-CM | POA: Diagnosis not present

## 2021-08-12 DIAGNOSIS — R059 Cough, unspecified: Secondary | ICD-10-CM | POA: Diagnosis not present

## 2021-08-12 DIAGNOSIS — Z923 Personal history of irradiation: Secondary | ICD-10-CM | POA: Insufficient documentation

## 2021-08-12 DIAGNOSIS — Z79811 Long term (current) use of aromatase inhibitors: Secondary | ICD-10-CM | POA: Insufficient documentation

## 2021-08-12 DIAGNOSIS — R0602 Shortness of breath: Secondary | ICD-10-CM | POA: Diagnosis not present

## 2021-08-12 DIAGNOSIS — Z885 Allergy status to narcotic agent status: Secondary | ICD-10-CM | POA: Insufficient documentation

## 2021-08-12 DIAGNOSIS — C779 Secondary and unspecified malignant neoplasm of lymph node, unspecified: Secondary | ICD-10-CM | POA: Diagnosis not present

## 2021-08-12 DIAGNOSIS — I2510 Atherosclerotic heart disease of native coronary artery without angina pectoris: Secondary | ICD-10-CM | POA: Insufficient documentation

## 2021-08-12 NOTE — Progress Notes (Signed)
?[place]   ?[phone] [phone]   ?Clinic Follow up Note  ? ?Patient Care Team: ?[name], [place] as PCP - General (Nurse Practitioner) ?[name], MD as Consulting Physician (General Surgery) ?Magrinat, [name], MD (Inactive) as Consulting Physician (Oncology) ?[name], MD as Consulting Physician (Radiation Oncology) ?[name], RN as Registered Nurse ?[name], MD as Referring Physician (Dermatology) ?[name], RN (Inactive) as Oncology Nurse Navigator ?[name], MD as Consulting Physician (Hematology) ?[name], MD as Consulting Physician (General Surgery) ?[name], MD as Consulting Physician (Hematology) ?Kristeen Miss, MD as Consulting Physician (Neurosurgery) ? ?Date of Service:  08/12/2021 ? ?CHIEF COMPLAINT: f/u of right breast cancer, colon cancer ? ?CURRENT THERAPY:  ?Antiestrogen therapy, started 04/2018, currently exemestane since 08/2020 ? ?ASSESSMENT & PLAN:  ?Sonia Sandoval is a 71 y.o. post-menopausal female with  ? ?1. Lung nodules, cavitary lesion in right upper lobe ?-Her initial staging CT scan in 08/2019 for colon cancer showed bilateral multiple small nodules measuring up to 4 mm, and adjacent nodularity of the right pulmonary apex, measuring 1.1 cm ?-she has been monitored with scans since that time. ?-most recent CT CAP on 08/06/21 showed increase in wall thickening and cavitation within right pulmonary apex, overall lesion now confluent and measuring 3.8 cm, consistent with ongoing, worsening infection.  I think this is much less likely malignant. LLL nodules unchanged and almost certainly benign. ?-she is scheduled for bronchoscopy with Dr. Valeta Harms on 08/31/21. ?  ?2. Colon Cancer, pT3N0M0, stage II ?-She was diagnosed in 08/2019. She is found to have malignant tumor in recto-sigmoid and biopsy confirmed adenocarcinoma. ?-She proceeded with colon surgery with Dr Dema Severin and Dr Marcello Moores on 11/22/19. Path  showed 4.5 cm invasive moderately differentiated adenocarcinoma involving distal sigmoid colon. ?-Adjuvant chemotherapy was not recommended ?-GuardianReveal (circulating tumor DNA) in 04/2020 was negative. Given insurance did not cover this, she is not interested in repeating this test.  ?-CT CAP on 08/06/21 showed NED. ?  ?3. 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 She was treated with B/l mastectomies by Dr Brantley Stage, pathology showed 4 cm IDC and 1/3 positive lymph nodes with extranodal extension. She is s/p adjuvant radiation by Dr Lisbeth Renshaw ?-She started on Anastrozole in 04/2018. She was previously followed by Dr Jana Hakim.   ?-Her 04/21/20 DEXA showed osteopenia -1.8 at right total femur, with low risk for fracture. Repeat every 2 years. ?-We switched her to exemestane in 08/2020 due to concerns with joint pain. She has developed worsening joint pain since Covid infection in 04/2021. We will hold exemestane until her next visit to see if this improves. At that time, we can discuss switching or stopping altogether. ?  ?4. 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  ?  ?PLAN:  ?-hold Exemestane due to her diffuse arthralgia ?-proceed with bronchoscopy on 08/31/21 with Dr. Valeta Harms ?-phone visit in 3 months, to see if she is willing to try tamoxifen. ? ? ?No problem-specific Assessment & Plan notes found for this encounter. ? ? ?SUMMARY OF ONCOLOGIC HISTORY: ?Oncology History Overview Note  ?Cancer Staging ?Cancer of sigmoid colon ([place]) ?Staging form: Colon and Rectum, AJCC 8th Edition ?- Pathologic stage from 11/22/2019: Stage IIA (pT3, pN0, cM0) - Signed by [name], MD on 12/17/2019 ? ?Malignant neoplasm of lower-outer quadrant of right breast of female, estrogen receptor positive ([place]) ?Staging form: Breast, AJCC 8th Edition ?-  Clinical: Stage IIB (cT2, cN1, cM0, G3, ER+, PR+, HER2-) - Unsigned ?- Pathologic: Stage IIA (pT2,  pN1(sn), cM0, G3, ER+, PR+, HER2-) - Unsigned ? ?  ?Malignant neoplasm of lower-outer quadrant of right breast of female, estrogen receptor positive ([place])  ?10/23/2017 Initial Diagnosis  ?  right breast lower outer quadrant biopsy 10/23/2017 for a clinical T2 N1, stage IIB invasive ductal carcinoma, grade 3, estrogen and progesterone receptor positive, HER-2 not amplified, with an MIB-1 of 20% ?  ?11/07/2017 Miscellaneous  ? Mammaprint on was read as low risk, predicting no significant chemotherapy benefit with a 5-year distant disease-free survival in the 97-98% range with hormone therapy alone ?  ?12/05/2017 Surgery  ? status post bilateral mastectomies on 12/05/2017 showing ?            (a) on the left, no evidence of malignancy ?            (b) on the right, a pT2 pN1, stage IIB invasive ductal carcinoma, grade 3, with negative margins. ?By Dr Brantley Stage ?  ?01/15/2018 - 02/28/2018 Radiation Therapy  ? adjuvant radiation 01/15/2018 - 02/28/2018 with Dr Lisbeth Renshaw  ?            Site/dose: The patient initially received a dose of 50.4 Gy in 28 fractions to the chest wall and supraclavicular region. This was delivered using a 3-D conformal, 4 field technique. The patient then received a boost to the mastectomy scar. This delivered an additional 10 Gy in 5 fractions using an en face electron field. The total dose was 60.4 Gy.  ?  ?04/01/2018 -  Anti-estrogen oral therapy  ? started anastrozole in 04/2018. Switched to Exemestane in 08/2020 due to joint pain. ?            (a) bone density 03/07/2018 with a T score of -1.8. Her 04/21/20 DEXA showed osteopenia -1.8 at right total femur, with low risk for fracture. ?  ?10/28/2019 Genetic Testing  ? Negative genetic testing.  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  The Common Hereditary Gene Panel offered by [facility] includes sequencing and/or deletion duplication testing of the following 48 genes: APC, ATM, AXIN2, BARD1, BMPR1A, BRCA1, BRCA2, BRIP1, CDH1,  CDK4, CDKN2A (p14ARF), CDKN2A (p16INK4a), CHEK2, CTNNA1, DICER1, EPCAM (Deletion/duplication testing only), GREM1 (promoter region deletion/duplication testing only), KIT, MEN1, MLH1, MSH2, MSH3, MSH6, MUTYH, NBN, NF1, NHTL1, PALB2, PDGFRA, PMS2, POLD1, POLE, PTEN, RAD50, RAD51C, RAD51D, RNF43, SDHB, SDHC, SDHD, SMAD4, SMARCA4. STK11, TP53, TSC1, TSC2, and VHL.  The following genes were evaluated for sequence changes only: SDHA and HOXB13 c.251G>A variant only. The report date is October 28, 2019. ?  ?03/08/2021 Imaging  ? CT Chest w/o contrast ? ?IMPRESSION: ?1. The previous FDG avid lung mass within the anterior right lower ?lobe has decreased in size compared with previous exam compatible with response to therapy. The cavitary lung nodule within the right apex is slightly increased in size from previous exam. Additionally, there is a new irregular nodule within the medial right upper lobe measuring 6 mm. Previously 3 mm. ?2. Interval increase in size of cavitary nodule within the right ?lung apex. This measures 2.3 x 1.5 cm, compared with 1.3 x 0.9 cm previously. Underlying malignancy cannot be excluded. ?3. New small nodule within the right upper lobe measures 6 mm. This has a nonspecific appearance but warrants attention on follow-up imaging. ?4. The remaining small pulmonary nodules noted previously are stable in the interval. ?5. Coronary artery calcifications noted. ?6.  Aortic Atherosclerosis (ICD10-I70.0). ?  ?Cancer of sigmoid colon ([place])  ?09/23/2019 Tumor Marker  ? Baseline CEA 1.4 on 09/23/19  ?  ?09/23/2019 Procedure  ? Colonoscopy and Upper endoscopy with Dr Alessandra Bevels 09/23/19  ?Upper Endoscopy Impression ?- Z-line regular, 35 cm from the incisors. ?- Small hiatal hernia. ?- Non-bleeding gastric ulcer. Biopsied. ?- Hematin (altered blood/coffee-ground-like material) in the gastric body. ?- Normal duodenal bulb, first portion of the duodenum and second portion of ?- Normal duodenal bulb, first portion of the  duodenum and second portion of the ?duodenum. ? ?Colonoscopy Impression ?- Preparation of the colon was poor. ?- Perianal skin tags found on perianal exam. ?- Likely malignant tumor in the recto-sigmoid colo

## 2021-08-12 NOTE — Telephone Encounter (Signed)
Spoke to [place] at Dr. Juline Patch office to ensure that CT CAP is sufficient for what he needs for upcoming bronchoscopy with biopsy.  She will relay message and have his nurse call back to confirm. ?

## 2021-08-12 NOTE — Telephone Encounter (Signed)
[facility] and she is wanting to know if the CT scan that was preformed on 08/06/21 is sufficient enough for Dr Valeta Harms for this patient broch that is schedule 08/31/21.  ? ?Please advise Judson Roch since Icard is out   ?

## 2021-08-16 NOTE — Telephone Encounter (Signed)
I was going to ask Dr Valeta Harms about this today when he got back to the office.  [place] I think you had told me you couldn't pull it and I new order would be needed?? ?

## 2021-08-17 NOTE — Telephone Encounter (Signed)
Sonia Sandoval - will a new order need to placed for CT?  You can't pull it up right? ?

## 2021-08-18 NOTE — Telephone Encounter (Signed)
CT was able to burn me a disc that works, so we're good.  Thanks! ?

## 2021-08-20 ENCOUNTER — Encounter: Payer: Self-pay | Admitting: Hematology

## 2021-08-27 ENCOUNTER — Other Ambulatory Visit: Payer: Self-pay | Admitting: Pulmonary Disease

## 2021-08-27 LAB — SARS CORONAVIRUS 2 (TAT 6-24 HRS): SARS Coronavirus 2: NEGATIVE

## 2021-08-30 ENCOUNTER — Other Ambulatory Visit: Payer: Self-pay

## 2021-08-30 ENCOUNTER — Encounter (HOSPITAL_COMMUNITY): Payer: Self-pay | Admitting: Pulmonary Disease

## 2021-08-30 NOTE — Pre-Procedure Instructions (Addendum)
SDW CALL ? ?Patient was given pre-op instructions over the phone. The opportunity was given for the patient to ask questions. No further questions asked. Patient verbalized understanding of instructions given. ? ? ?PCP - [name], NP ?Cardiologist - denies ? ?PPM/ICD - denies ?Chest x-ray - N/A ?EKG - denies ?Stress Test - "years ago"- normal ?ECHO - denies ?Cardiac Cath -denies  ? ?Sleep Study - denies ? ?Blood Thinner Instructions: pt does not take any blood thinners ? ?ERAS Protcol - NPO ? ?COVID TEST- negative 08/27/21 ? ?Pt reports hx of MRSA infection to eyelid a few years ago. Pt also reports having cyst to back of neck in February 2023 that is no longer open. Pt reports this was "suspicious for MRSA", but was never tested.  ? ?Anesthesia review: no ? ?Patient denies shortness of breath, fever, cough and chest pain over the phone call ? ? ? ?Surgical Instructions ? ? ? Your procedure is scheduled on Tuesday, May 2 ? Report to [facility] Main Entrance "A" at 6:45 A.M., then check in with the Admitting office. ? Call this number if you have problems the morning of surgery: ? [phone] ? ? ? Remember: ? Do not eat or drink after midnight the night before your surgery ? ? Take these medicines the morning of surgery with A SIP OF WATER:  ?gabapentin (NEURONTIN) ?pantoprazole (PROTONIX) ?tiZANidine (ZANAFLEX if needed ?ALPRAZolam Duanne Moron)  if needed ? ? ?As of today, STOP taking any Aspirin (unless otherwise instructed by your surgeon) Aleve, Naproxen, Ibuprofen, Motrin, Advil, Goody's, BC's, all herbal medications, fish oil, and all vitamins, Pseudoephedrine-Naproxen Na (ALEVE-D SINUS & HEADACHE PO) ? ?[facility] is not responsible for any belongings or valuables.  ? ?Do NOT Smoke (Tobacco/Vaping)  24 hours prior to your procedure ? ?If you use a CPAP at night, you may bring your mask for your overnight stay. ?  ?Contacts, glasses, hearing aids, dentures or partials may not be worn into surgery, please  bring cases for these belongings ?  ?Patients discharged the day of surgery will not be allowed to drive home, and someone needs to stay with them for 24 hours. ? ? ?SURGICAL WAITING ROOM VISITATION ?No visitors are allowed in pre-op area with patient.  ?Patients having surgery or a procedure in a hospital may have two support people in the waiting room. ?Children under the age of 42 must have an adult with them who is not the patient. ?They may stay in the waiting area during the procedure and may switch out with other visitors. If the patient needs to stay at the hospital during part of their recovery, the visitor guidelines for inpatient rooms apply. ? ?Please refer to the [facility] website for the visitor guidelines for Inpatients (after your surgery is over and you are in a regular room).  ? ? ? ?Special instructions:   ? ?Oral Hygiene is also important to reduce your risk of infection.  Remember - BRUSH YOUR TEETH THE MORNING OF SURGERY WITH YOUR REGULAR TOOTHPASTE ? ? ?Day of Surgery: ? ?Take a shower the day of or night before with antibacterial soap. ?Wear Clean/Comfortable clothing the morning of surgery ?Do not apply any deodorants/lotions.   ?Do not wear jewelry or makeup ?Do not wear lotions, powders, perfumes/colognes, or deodorant. ?Do not shave 48 hours prior to surgery.  Men may shave face and neck. ?Do not bring valuables to the hospital. ?Do not wear nail polish, gel polish, artificial nails, or any other type of  covering on natural nails (fingers and toes) ?If you have artificial nails or gel coating that need to be removed by a nail salon, please have this removed prior to surgery. Artificial nails or gel coating may interfere with anesthesia's ability to adequately monitor your vital signs. ?Remember to brush your teeth WITH YOUR REGULAR TOOTHPASTE. ? ? ? ? ? ?

## 2021-08-31 ENCOUNTER — Ambulatory Visit (HOSPITAL_COMMUNITY): Payer: Commercial Managed Care - PPO | Admitting: Anesthesiology

## 2021-08-31 ENCOUNTER — Encounter (HOSPITAL_COMMUNITY): Payer: Self-pay | Admitting: Pulmonary Disease

## 2021-08-31 ENCOUNTER — Ambulatory Visit (HOSPITAL_COMMUNITY): Payer: Commercial Managed Care - PPO

## 2021-08-31 ENCOUNTER — Ambulatory Visit (HOSPITAL_COMMUNITY)
Admission: RE | Admit: 2021-08-31 | Discharge: 2021-08-31 | Disposition: A | Discharge status: Home / Self Care | Payer: Commercial Managed Care - PPO | Attending: Pulmonary Disease | Admitting: Pulmonary Disease

## 2021-08-31 ENCOUNTER — Encounter (HOSPITAL_COMMUNITY)
Admission: RE | Disposition: A | Discharge status: Home / Self Care | Payer: Self-pay | Source: Home / Self Care | Attending: Pulmonary Disease

## 2021-08-31 ENCOUNTER — Ambulatory Visit (HOSPITAL_BASED_OUTPATIENT_CLINIC_OR_DEPARTMENT_OTHER): Payer: Commercial Managed Care - PPO | Admitting: Anesthesiology

## 2021-08-31 DIAGNOSIS — R846 Abnormal cytological findings in specimens from respiratory organs and thorax: Secondary | ICD-10-CM | POA: Insufficient documentation

## 2021-08-31 DIAGNOSIS — Z853 Personal history of malignant neoplasm of breast: Secondary | ICD-10-CM | POA: Diagnosis not present

## 2021-08-31 DIAGNOSIS — J984 Other disorders of lung: Secondary | ICD-10-CM | POA: Insufficient documentation

## 2021-08-31 DIAGNOSIS — R911 Solitary pulmonary nodule: Secondary | ICD-10-CM

## 2021-08-31 DIAGNOSIS — Z85038 Personal history of other malignant neoplasm of large intestine: Secondary | ICD-10-CM | POA: Insufficient documentation

## 2021-08-31 HISTORY — PX: BRONCHIAL BIOPSY: SHX5109

## 2021-08-31 HISTORY — PX: BRONCHIAL NEEDLE ASPIRATION BIOPSY: SHX5106

## 2021-08-31 HISTORY — DX: Malignant neoplasm of colon, unspecified: C18.9

## 2021-08-31 HISTORY — PX: BRONCHIAL BRUSHINGS: SHX5108

## 2021-08-31 HISTORY — DX: Dyspnea, unspecified: R06.00

## 2021-08-31 HISTORY — PX: VIDEO BRONCHOSCOPY WITH RADIAL ENDOBRONCHIAL ULTRASOUND: SHX6849

## 2021-08-31 HISTORY — DX: Chronic obstructive pulmonary disease, unspecified: J44.9

## 2021-08-31 HISTORY — PX: BRONCHIAL WASHINGS: SHX5105

## 2021-08-31 HISTORY — DX: Anemia, unspecified: D64.9

## 2021-08-31 SURGERY — BRONCHOSCOPY, WITH BIOPSY USING ELECTROMAGNETIC NAVIGATION
Anesthesia: General | Laterality: Bilateral

## 2021-08-31 MED ORDER — SUGAMMADEX SODIUM 200 MG/2ML IV SOLN
INTRAVENOUS | Status: DC | PRN
  Administered 2021-08-31: 125 mg via INTRAVENOUS

## 2021-08-31 MED ORDER — LACTATED RINGERS IV SOLN: INTRAVENOUS | Status: DC

## 2021-08-31 MED ORDER — PROPOFOL 10 MG/ML IV BOLUS
INTRAVENOUS | Status: DC | PRN
  Administered 2021-08-31: 120 mg via INTRAVENOUS

## 2021-08-31 MED ORDER — FENTANYL CITRATE (PF) 100 MCG/2ML IJ SOLN: 25.0000 ug | INTRAMUSCULAR | Status: DC | PRN

## 2021-08-31 MED ORDER — CHLORHEXIDINE GLUCONATE 0.12 % MT SOLN: 15.0000 mL | Freq: Once | OROMUCOSAL | Status: AC

## 2021-08-31 MED ORDER — CHLORHEXIDINE GLUCONATE 0.12 % MT SOLN
OROMUCOSAL | Status: AC
  Administered 2021-08-31: 15 mL via OROMUCOSAL
  Filled 2021-08-31: qty 15

## 2021-08-31 MED ORDER — SUCCINYLCHOLINE CHLORIDE 200 MG/10ML IV SOSY
PREFILLED_SYRINGE | INTRAVENOUS | Status: DC | PRN
  Administered 2021-08-31: 100 mg via INTRAVENOUS

## 2021-08-31 MED ORDER — PHENYLEPHRINE 80 MCG/ML (10ML) SYRINGE FOR IV PUSH (FOR BLOOD PRESSURE SUPPORT)
PREFILLED_SYRINGE | INTRAVENOUS | Status: DC | PRN
  Administered 2021-08-31: 120 ug via INTRAVENOUS
  Administered 2021-08-31 (×4): 80 ug via INTRAVENOUS
  Administered 2021-08-31: 120 ug via INTRAVENOUS
  Administered 2021-08-31 (×4): 80 ug via INTRAVENOUS

## 2021-08-31 MED ORDER — MIDAZOLAM HCL 5 MG/5ML IJ SOLN
INTRAMUSCULAR | Status: DC | PRN
  Administered 2021-08-31: 1 mg via INTRAVENOUS

## 2021-08-31 MED ORDER — ROCURONIUM BROMIDE 10 MG/ML (PF) SYRINGE
PREFILLED_SYRINGE | INTRAVENOUS | Status: DC | PRN
Start: 2021-08-31 — End: 2021-08-31
  Administered 2021-08-31: 40 mg via INTRAVENOUS

## 2021-08-31 MED ORDER — AMISULPRIDE (ANTIEMETIC) 5 MG/2ML IV SOLN: 10.0000 mg | Freq: Once | INTRAVENOUS | Status: DC | PRN

## 2021-08-31 MED ORDER — ACETAMINOPHEN 500 MG PO TABS
1000.0000 mg | ORAL_TABLET | Freq: Once | ORAL | Status: AC
  Administered 2021-08-31: 1000 mg via ORAL
  Filled 2021-08-31: qty 2

## 2021-08-31 MED ORDER — FENTANYL CITRATE (PF) 250 MCG/5ML IJ SOLN
INTRAMUSCULAR | Status: DC | PRN
  Administered 2021-08-31: 50 ug via INTRAVENOUS

## 2021-08-31 MED ORDER — LIDOCAINE 2% (20 MG/ML) 5 ML SYRINGE
INTRAMUSCULAR | Status: DC | PRN
  Administered 2021-08-31: 60 mg via INTRAVENOUS

## 2021-08-31 MED ORDER — DEXAMETHASONE SODIUM PHOSPHATE 10 MG/ML IJ SOLN
INTRAMUSCULAR | Status: DC | PRN
  Administered 2021-08-31: 10 mg via INTRAVENOUS

## 2021-08-31 MED ORDER — PROPOFOL 500 MG/50ML IV EMUL
INTRAVENOUS | Status: DC | PRN
  Administered 2021-08-31: 125 ug/kg/min via INTRAVENOUS

## 2021-08-31 MED ORDER — ONDANSETRON HCL 4 MG/2ML IJ SOLN
INTRAMUSCULAR | Status: DC | PRN
  Administered 2021-08-31: 4 mg via INTRAVENOUS

## 2021-08-31 NOTE — Anesthesia Preprocedure Evaluation (Addendum)
Anesthesia Evaluation  ?Patient identified by MRN, date of birth, ID band ?Patient awake ? ? ? ?Reviewed: ?Allergy & Precautions, NPO status , Patient's Chart, lab work & pertinent test results ? ?History of Anesthesia Complications ?(+) PONV and history of anesthetic complications ? ?Airway ?Mallampati: II ? ?TM Distance: >3 FB ?Neck ROM: Full ? ? ? Dental ? ?(+) Dental Advisory Given, Teeth Intact ?  ?Pulmonary ?COPD,  ?  ?breath sounds clear to auscultation ? ? ? ? ? ? Cardiovascular ?negative cardio ROS ? ? ?Rhythm:Regular Rate:Normal ? ? ?  ?Neuro/Psych ?negative neurological ROS ?   ? GI/Hepatic ?Neg liver ROS, Colon CA ? ?  ?Endo/Other  ?negative endocrine ROS ? Renal/GU ?negative Renal ROS  ? ?  ?Musculoskeletal ? ? Abdominal ?  ?Peds ? Hematology ?negative hematology ROS ?(+)   ?Anesthesia Other Findings ? ? Reproductive/Obstetrics ? ?  ? ? ? ? ? ? ? ? ? ? ? ? ? ?  ?  ? ? ? ? ? ? ? ?Anesthesia Physical ?Anesthesia Plan ? ?ASA: 2 ? ?Anesthesia Plan: General  ? ?Post-op Pain Management: Minimal or no pain anticipated  ? ?Induction:  ? ?PONV Risk Score and Plan: 3 and Dexamethasone, Ondansetron, Treatment may vary due to age or medical condition and TIVA ? ?Airway Management Planned: Oral ETT ? ?Additional Equipment: None ? ?Intra-op Plan:  ? ?Post-operative Plan: Extubation in OR ? ?Informed Consent: I have reviewed the patients History and Physical, chart, labs and discussed the procedure including the risks, benefits and alternatives for the proposed anesthesia with the patient or authorized representative who has indicated his/her understanding and acceptance.  ? ? ? ?Dental advisory given ? ?Plan Discussed with: CRNA ? ?Anesthesia Plan Comments:   ? ? ? ? ? ?Anesthesia Quick Evaluation ? ?

## 2021-08-31 NOTE — Discharge Instructions (Signed)
Flexible Bronchoscopy, Care After ?This sheet gives you information about how to care for yourself after your test. Your doctor may also give you more specific instructions. If you have problems or questions, contact your doctor. ?Follow these instructions at home: ?Eating and drinking ?Do not eat or drink anything (not even water) for 2 hours after your test, or until your numbing medicine (local anesthetic) wears off. ?When your numbness is gone and your cough and gag reflexes have come back, you may: ?Eat only soft foods. ?Slowly drink liquids. ?The day after the test, go back to your normal diet. ?Driving ?Do not drive for 24 hours if you were given a medicine to help you relax (sedative). ?Do not drive or use heavy machinery while taking prescription pain medicine. ?General instructions ? ?Take over-the-counter and prescription medicines only as told by your doctor. ?Return to your normal activities as told. Ask what activities are safe for you. ?Do not use any products that have nicotine or tobacco in them. This includes cigarettes and e-cigarettes. If you need help quitting, ask your doctor. ?Keep all follow-up visits as told by your doctor. This is important. It is very important if you had a tissue sample (biopsy) taken. ?Get help right away if: ?You have shortness of breath that gets worse. ?You get light-headed. ?You feel like you are going to pass out (faint). ?You have chest pain. ?You cough up: ?More than a little blood. ?More blood than before. ?Summary ?Do not eat or drink anything (not even water) for 2 hours after your test, or until your numbing medicine wears off. ?Do not use cigarettes. Do not use e-cigarettes. ?Get help right away if you have chest pain. ? ?This information is not intended to replace advice given to you by your health care provider. Make sure you discuss any questions you have with your health care provider. ?Document Released: 02/13/2009 Document Revised: 03/31/2017 Document  Reviewed: 05/06/2016 ?Elsevier Patient Education ? [place]. ? ?

## 2021-08-31 NOTE — Interval H&P Note (Signed)
History and Physical Interval Note: ? ?08/31/2021 ?8:44 AM ? ?Sonia Sandoval  has presented today for surgery, with the diagnosis of cavitary lung lesion.  The various methods of treatment have been discussed with the patient and family. After consideration of risks, benefits and other options for treatment, the patient has consented to  Procedure(s) with comments: ?ROBOTIC ASSISTED NAVIGATIONAL BRONCHOSCOPY (Bilateral) - ion w/ cios as a surgical intervention.  The patient's history has been reviewed, patient examined, no change in status, stable for surgery.  I have reviewed the patient's chart and labs.  Questions were answered to the patient's satisfaction.   ? ? ?Sonia Sandoval ? ? ?

## 2021-08-31 NOTE — Op Note (Signed)
Video Bronchoscopy with Robotic Assisted Bronchoscopic Navigation  ? ?Date of Operation: 08/31/2021  ? ?Pre-op Diagnosis: RUL cavitary lesion  ? ?Post-op Diagnosis: RUL cavitary lesion  ? ?Surgeon: Garner Nash, DO ? ?Assistants: None  ? ?Anesthesia: General endotracheal anesthesia ? ?Operation: Flexible video fiberoptic bronchoscopy with robotic assistance and biopsies. ? ?Estimated Blood Loss: Minimal ? ?Complications: None ? ?Indications and History: ?Sonia Sandoval is a 71 y.o. female with history of RUL cavitary lesion, h/o breast cancer . The risks, benefits, complications, treatment options and expected outcomes were discussed with the patient.  The possibilities of pneumothorax, pneumonia, reaction to medication, pulmonary aspiration, perforation of a viscus, bleeding, failure to diagnose a condition and creating a complication requiring transfusion or operation were discussed with the patient who freely signed the consent.   ? ?Description of Procedure: ?The patient was seen in the Preoperative Area, was examined and was deemed appropriate to proceed.  The patient was taken to [facility] endoscopy room 3, identified as Sonia Sandoval and the procedure verified as Flexible Video Fiberoptic Bronchoscopy.  A Time Out was held and the above information confirmed.  ? ?Prior to the date of the procedure a high-resolution CT scan of the chest was performed. Utilizing ION software program a virtual tracheobronchial tree was generated to allow the creation of distinct navigation pathways to the patient's parenchymal abnormalities. After being taken to the operating room general anesthesia was initiated and the patient  was orally intubated. The video fiberoptic bronchoscope was introduced via the endotracheal tube and a general inspection was performed which showed normal right and left lung anatomy, aspiration of the bilateral mainstems was completed to remove any remaining secretions. Robotic catheter inserted into  patient's endotracheal tube.  ? ?Target #1 right upper lobe cavitary lesion: ?The distinct navigation pathways prepared prior to this procedure were then utilized to navigate to patient's lesion identified on CT scan. The robotic catheter was secured into place and the vision probe was withdrawn.  Lesion location was approximated using fluoroscopy and radial endobronchial ultrasound for peripheral targeting. Under fluoroscopic guidance transbronchial needle brushings, transbronchial needle biopsies, and transbronchial forceps biopsies were performed to be sent for cytology and pathology. A bronchioalveolar lavage was performed in the right upper lobe and sent for microbiology.  Additional transbronchial biopsies sent for tissue culture. ? ?At the end of the procedure a general airway inspection was performed and there was no evidence of active bleeding. The bronchoscope was removed.  The patient tolerated the procedure well. There was no significant blood loss and there were no obvious complications. A post-procedural chest x-ray is pending. ? ?Samples Target #1: ?1. Transbronchial needle brushings from RUL ?2. Transbronchial Wang needle biopsies from RUL ?3. Transbronchial forceps biopsies from RUL ?4. Bronchoalveolar lavage from RUL ? ?Plans:  ?The patient will be discharged from the PACU to home when recovered from anesthesia and after chest x-ray is reviewed. We will review the cytology, pathology and microbiology results with the patient when they become available. Outpatient followup will be with [name], DO. ? ?Garner Nash, DO ?[facility] ?08/31/2021 10:05 AM  ?   ?

## 2021-08-31 NOTE — Transfer of Care (Signed)
Immediate Anesthesia Transfer of Care Note ? ?Patient: Sonia Sandoval ? ?Procedure(s) Performed: ROBOTIC ASSISTED NAVIGATIONAL BRONCHOSCOPY (Bilateral) ?RADIAL ENDOBRONCHIAL ULTRASOUND ?BRONCHIAL BIOPSIES ?BRONCHIAL BRUSHINGS ?BRONCHIAL NEEDLE ASPIRATION BIOPSIES ?BRONCHIAL WASHINGS ? ?Patient Location: PACU ? ?Anesthesia Type:General ? ?Level of Consciousness: oriented, sedated and patient cooperative ? ?Airway & Oxygen Therapy: Patient Spontanous Breathing and Patient connected to nasal cannula oxygen ? ?Post-op Assessment: Report given to RN and Post -op Vital signs reviewed and stable ? ?Post vital signs: Reviewed ? ?Last Vitals:  ?Vitals Value Taken Time  ?BP 100/77 08/31/21 1029  ?Temp    ?Pulse 71 08/31/21 1031  ?Resp 20 08/31/21 1031  ?SpO2 96 % 08/31/21 1031  ?Vitals shown include unvalidated device data. ? ?Last Pain: There were no vitals filed for this visit.   ? ?  ? ?Complications: No notable events documented. ?

## 2021-08-31 NOTE — Anesthesia Procedure Notes (Signed)
Procedure Name: Intubation ?Date/Time: 08/31/2021 9:15 AM ?Performed by: Jenne Campus, CRNA ?Pre-anesthesia Checklist: Patient identified, Emergency Drugs available, Suction available and Patient being monitored ?Patient Re-evaluated:Patient Re-evaluated prior to induction ?Oxygen Delivery Method: Circle System Utilized ?Preoxygenation: Pre-oxygenation with 100% oxygen ?Induction Type: IV induction ?Ventilation: Mask ventilation without difficulty ?Laryngoscope Size: Sabra Heck and 2 ?Grade View: Grade I ?Tube type: Oral ?Tube size: 8.5 mm ?Number of attempts: 1 ?Airway Equipment and Method: Stylet and Oral airway ?Placement Confirmation: ETT inserted through vocal cords under direct vision, positive ETCO2 and breath sounds checked- equal and bilateral ?Secured at: 23 cm ?Tube secured with: Tape ?Dental Injury: Teeth and Oropharynx as per pre-operative assessment  ? ? ? ? ?

## 2021-08-31 NOTE — Anesthesia Postprocedure Evaluation (Signed)
Anesthesia Post Note ? ?Patient: Sonia Sandoval ? ?Procedure(s) Performed: ROBOTIC ASSISTED NAVIGATIONAL BRONCHOSCOPY (Bilateral) ?RADIAL ENDOBRONCHIAL ULTRASOUND ?BRONCHIAL BIOPSIES ?BRONCHIAL BRUSHINGS ?BRONCHIAL NEEDLE ASPIRATION BIOPSIES ?BRONCHIAL WASHINGS ? ?  ? ?Patient location during evaluation: PACU ?Anesthesia Type: General ?Level of consciousness: awake and alert ?Pain management: pain level controlled ?Vital Signs Assessment: post-procedure vital signs reviewed and stable ?Respiratory status: spontaneous breathing, nonlabored ventilation, respiratory function stable and patient connected to nasal cannula oxygen ?Cardiovascular status: blood pressure returned to baseline and stable ?Postop Assessment: no apparent nausea or vomiting ?Anesthetic complications: no ? ? ?No notable events documented. ? ?Last Vitals:  ?Vitals:  ? 08/31/21 1045 08/31/21 1100  ?BP: 108/65 109/68  ?Pulse: 68 69  ?Resp: 17 13  ?Temp:  36.4 ?C  ?SpO2: 100% 98%  ?  ?Last Pain:  ?Vitals:  ? 08/31/21 1100  ?PainSc: 0-No pain  ? ? ?  ?  ?  ?  ?  ?  ? ?Suzette Battiest E ? ? ? ? ?

## 2021-09-01 ENCOUNTER — Encounter (HOSPITAL_COMMUNITY): Payer: Self-pay | Admitting: Pulmonary Disease

## 2021-09-01 LAB — ACID FAST SMEAR (AFB, MYCOBACTERIA)
Acid Fast Smear: NEGATIVE
Acid Fast Smear: NEGATIVE

## 2021-09-01 LAB — CYTOLOGY - NON PAP

## 2021-09-03 LAB — CULTURE, BAL-QUANTITATIVE W GRAM STAIN: Culture: NO GROWTH

## 2021-09-05 LAB — AEROBIC/ANAEROBIC CULTURE W GRAM STAIN (SURGICAL/DEEP WOUND)
Culture: NO GROWTH
Gram Stain: NONE SEEN

## 2021-09-06 ENCOUNTER — Other Ambulatory Visit: Payer: Self-pay | Admitting: *Deleted

## 2021-09-06 DIAGNOSIS — A310 Pulmonary mycobacterial infection: Secondary | ICD-10-CM

## 2021-09-06 NOTE — Progress Notes (Signed)
Ty,  ? ?Please let the patient know that her bronchoscopy cultures grew MYCOBACTERIUM ABSCESSUS.  Please place referral to infectious disease. ? ?Thanks, ? ?[facility] ? ?CC: Dr. Gale Journey ? ?Garner Nash, DO ?[facility] ?09/06/2021 8:02 AM  ?

## 2021-09-15 ENCOUNTER — Ambulatory Visit (INDEPENDENT_AMBULATORY_CARE_PROVIDER_SITE_OTHER): Payer: Commercial Managed Care - PPO | Admitting: Internal Medicine

## 2021-09-15 ENCOUNTER — Other Ambulatory Visit: Payer: Self-pay

## 2021-09-15 ENCOUNTER — Encounter: Payer: Self-pay | Admitting: Internal Medicine

## 2021-09-15 VITALS — BP 139/87 | HR 86 | Temp 98.1°F | Ht 63.0 in | Wt 144.0 lb

## 2021-09-15 DIAGNOSIS — Z85038 Personal history of other malignant neoplasm of large intestine: Secondary | ICD-10-CM

## 2021-09-15 DIAGNOSIS — A319 Mycobacterial infection, unspecified: Secondary | ICD-10-CM

## 2021-09-15 DIAGNOSIS — J189 Pneumonia, unspecified organism: Secondary | ICD-10-CM | POA: Diagnosis not present

## 2021-09-15 DIAGNOSIS — J984 Other disorders of lung: Secondary | ICD-10-CM

## 2021-09-15 DIAGNOSIS — Z853 Personal history of malignant neoplasm of breast: Secondary | ICD-10-CM | POA: Diagnosis not present

## 2021-09-15 NOTE — Progress Notes (Signed)
[facility]  Reason for Consult:cavitary lung disease/infection Referring Provider: June Sandoval    Patient Active Problem List   Diagnosis Date Noted   Cavitary lesion of lung 08/08/2021   S/P laparoscopic-assisted sigmoidectomy 11/22/2019   Genetic testing 10/30/2019   Family history of breast cancer    Cancer of sigmoid colon ([place]) 09/26/2019   Iron deficiency anemia due to chronic blood loss 09/26/2019   Symptomatic anemia 09/21/2019   GI bleed 09/20/2019   Malignant neoplasm of lower-outer quadrant of right breast of female, estrogen receptor positive ([place]) 10/27/2017    Cc -- reason for consult = ntm cavitary lung disease  HPI: Sonia Sandoval is a 71 y.o. 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 Was supposed to be seen several months prior but didn't show.    5/02 underwent bronch: "The distinct navigation pathways prepared prior to this procedure were then utilized to navigate to patient's lesion identified on CT scan. The robotic catheter was secured into place and the vision probe was withdrawn.  Lesion location was approximated using fluoroscopy and radial endobronchial ultrasound for peripheral targeting. Under fluoroscopic guidance transbronchial needle brushings, transbronchial needle biopsies, and transbronchial forceps biopsies were performed to be sent for cytology and pathology. A bronchioalveolar lavage was performed in the right upper lobe and sent for microbiology.  Additional transbronchial biopsies  sent for tissue culture.   At the end of the procedure a general airway inspection was performed and there was no evidence of active bleeding. The bronchoscope was removed.  The patient tolerated the procedure well. There was no significant blood loss and there were no obvious complications. A post-procedural chest x-ray is pending."  Cytology didn't see cancer cells  On micro -- something is growing that looks like ntm and the [employer] technology calls it abscessus but it was sent on 5/8th to reference lab to identify   Patient without respiratory sx outside of stable mild dyspnea on exertion 4-5 months. She would encounter this walking up a hill No b sx Has watery eyes and a recent dry cough but query if it's allergy No reflux No decreased appetite No weight loss   Review of Systems: ROS All other ros negative      Past Medical History:  Diagnosis Date   Allergies 1995   Per patient report 11/14/18   Anemia    prior to finding colon cancer, no longer any issues with this per patient   Breast cancer [facility])    Breast cancer, right breast ([name]) 09/2017   S/P mastectomy 12/05/2017   Colon cancer ([place])    COPD (chronic obstructive pulmonary disease) ([place])    pt reports previous MD diagnosed her with this, but takes no medications   Dyspnea    occasionally with exertion   Family history of breast cancer    Headache 1968   per patient report, started as a teenager   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   Osteopenia 03/2018   Seen on DEXA Scan   PONV (postoperative nausea and vomiting)     Social History   Tobacco Use   Smoking status: Never   Smokeless tobacco: Never  Vaping Use   Vaping Use: Never used  Substance Use Topics   Alcohol use: Yes    Alcohol/week: 14.0 standard drinks    Types: 14 Glasses of wine per week    Comment: 2-3 3oz glasses of wine per  day   Drug use: Never    Family History  Problem Relation Age of Onset   Dementia Mother    Lung cancer Father        smoked   Emphysema Father    Heart disease Maternal Grandfather    Cancer Maternal Aunt 60       breast cancer    Cancer Cousin        maternal first cousin with breast cancer in her 70s    Allergies  Allergen Reactions   Codeine Nausea And Vomiting and Other (See Comments)    headache    OBJECTIVE: Vitals:   09/15/21 1445  BP: 139/87  Pulse: 86  Temp: 98.1 F (36.7 C)  TempSrc: Oral  SpO2: 97%  Weight: 144 lb (65.3 kg)  Height: _0  (1.6 m)   Body mass index is 25.51 kg/m.   Physical Exam General/constitutional: no distress, pleasant HEENT: Normocephalic, PER, Conj Clear, EOMI, Oropharynx clear Neck supple CV: rrr no mrg Lungs: clear to auscultation, normal respiratory effort Abd: Soft, Nontender Ext: no edema Skin: No Rash Neuro: nonfocal MSK: no peripheral joint swelling/tenderness/warmth; back spines nontender    Lab: Lab Results  Component Value Date   WBC 5.3 08/06/2021   HGB 12.3 08/06/2021   HCT 37.3 08/06/2021   MCV 89.0 08/06/2021   PLT 281 07/37/1062   Last metabolic panel Lab Results  Component Value Date   GLUCOSE 89 08/06/2021   NA 138 08/06/2021   K 4.0 08/06/2021   CL 102 08/06/2021   CO2 29 08/06/2021   BUN 15 08/06/2021   CREATININE 0.75 08/06/2021   GFRNONAA >60 08/06/2021   CALCIUM 9.3 08/06/2021   PHOS 3.6 11/14/2018   PROT 7.1 08/06/2021   ALBUMIN 4.0 08/06/2021   BILITOT 0.4 08/06/2021   ALKPHOS 96 08/06/2021   AST 24 08/06/2021   ALT 25 08/06/2021   ANIONGAP 7 08/06/2021    Microbiology:  Serology:  Imaging: I have reviewed the imaging personally and incorporated into decision making  08/06/21 chest abd pelv ct with contrast FINDINGS: CT CHEST FINDINGS   Cardiovascular: Aortic atherosclerosis. Normal heart size. No pericardial effusion.   Mediastinum/Nodes: No enlarged mediastinal,  hilar, or axillary lymph nodes. Thyroid gland, trachea, and esophagus demonstrate no significant findings.   Lungs/Pleura: Interval increase in wall thickening and cavitation within the right pulmonary apex, overall lesion now confluent and measuring 3.8 x 3.1 cm (series 4, image 24). Increased clustered centrilobular nodularity and small consolidations in this vicinity (series 4, image 37). Residual bandlike scarring in the bilateral lung bases (series 4, image 109, 122). Occasional small nodules of the left lower lobe measuring up to 0.5 cm (series 4, image 21). No pleural effusion or pneumothorax.   Musculoskeletal: No chest wall mass or suspicious osseous lesions identified. Status post bilateral mastectomy.   CT ABDOMEN PELVIS FINDINGS   Hepatobiliary: No solid liver abnormality is seen. No gallstones, gallbladder wall thickening, or biliary dilatation.   Pancreas: Unremarkable. No pancreatic  ductal dilatation or surrounding inflammatory changes.   Spleen: Normal in size without significant abnormality.   Adrenals/Urinary Tract: Adrenal glands are unremarkable. Punctuate nonobstructive calculus in the inferior pole of the right kidney (series 5, image 89). No left-sided calculi, ureteral calculi, or hydronephrosis. Bladder is unremarkable.   Stomach/Bowel: Stomach is within normal limits. Appendix is not clearly visualized and may be surgically absent. The cecum is anterior to the liver. Status post sigmoid colon resection and reanastomosis. No evidence of bowel wall thickening, distention, or inflammatory changes.   Vascular/Lymphatic: Aortic atherosclerosis. No enlarged abdominal or pelvic lymph nodes.   Reproductive: No mass or other abnormality.   Other: No abdominal wall hernia or abnormality. No ascites.   Musculoskeletal: No acute osseous findings.   IMPRESSION: 1. Status post sigmoid colon resection and reanastomosis. No evidence of lymphadenopathy or  metastatic disease in the chest, abdomen, or pelvis. 2. Interval increase in wall thickening and cavitation within the right pulmonary apex, overall lesion now confluent and measuring 3.8 x 3.1 cm. Increased clustered centrilobular nodularity and small consolidations in this vicinity. Findings are consistent with ongoing, worsened cavitary infection. 3. Additional small nodules of the left lower lobe measuring up to 0.5 cm, unchanged and almost certainly benign, incidental sequelae of infection inflammation. 4. Nonobstructive right nephrolithiasis.   Assessment/plan: Problem List Items Addressed This Visit   None Visit Diagnoses     Cavitary pneumonia    -  Primary   Nontuberculous atypical mycobacterial disease       History of breast cancer       History of colon cancer             Discussed natural history, epidimiology, pathogenesis, diagnosis criteria and treatment indication for ntm lungs  Discussed resistant nature of abscessus, cure rate, relapse rate  At this time she is completely assymptomatic but imaging has indicated progression of cavitary lesion. On cytology there was no malignant cells. She had hx of colon cancer and breast cancer at least 3 years prior to this admission, and all are in remission  At this time culture is still pending final ID confirmation and susceptibility testing  I would treat her based on progression of ct scan  Will await susceptibility and follow up with her in around 4-6 weeks to decide on treatment regimen        Follow-up: Return in about 6 weeks (around 10/27/2021).  Sonia Sandoval, [facility] 09/15/2021, 2:59 PM

## 2021-09-15 NOTE — Patient Instructions (Signed)
We spoke with labs today and it appears something is in fact growing and needing confirmation  ? ?This process will take up to another 4 weeks ? ?Please schedule a 4-6 weeks visit with me when you check out ? ? ?You don't have symptoms now but if this bacteria is confirmed, we'll need to treat you based on the changes in your lung which is likely due to this organism ? ? ?You can still have regular pneumonia (which would proceed in days and make you very sick) so if you have fever, productive cough, malaise, chest pain, worsening short of breath over a few days do seek more urgent care ?

## 2021-09-16 NOTE — Telephone Encounter (Signed)
Please advise if she still needs an appointment?

## 2021-09-17 ENCOUNTER — Encounter: Payer: Self-pay | Admitting: Acute Care

## 2021-09-17 ENCOUNTER — Ambulatory Visit (INDEPENDENT_AMBULATORY_CARE_PROVIDER_SITE_OTHER): Payer: Commercial Managed Care - PPO | Admitting: Acute Care

## 2021-09-17 VITALS — BP 120/74 | HR 85 | Temp 97.7°F | Ht 63.0 in | Wt 144.2 lb

## 2021-09-17 DIAGNOSIS — R0600 Dyspnea, unspecified: Secondary | ICD-10-CM

## 2021-09-17 DIAGNOSIS — J984 Other disorders of lung: Secondary | ICD-10-CM

## 2021-09-17 NOTE — Patient Instructions (Addendum)
It is good to see you today. We will plan on a 3 month follow up CT Chest without contrast to re-evaluate the nodule of concern ion your right upper lobe. Follow up appointment in 3 months after repeat Ct Chest We will move your 7/12 appointment with Dr. Valeta Harms to an August date after your follow up Ct Chest.and PFT's at that time. We will order and schedule PFT's.  You will get a call to get these scheduled.  Follow up with ID regarding treatment of MYCOBACTERIUM ABSCESSUS  Please ask your Primary care MD to consider Autoimmune testing to see if this could be the reason for your aching joints.  Please contact office for sooner follow up if symptoms do not improve or worsen or seek emergency care

## 2021-09-17 NOTE — Progress Notes (Signed)
History of Present Illness Sonia Sandoval is a 71 year old female never smoker with , past medical history of colon cancer, stage IIa, malignant neoplasm of the right breast ER positive stage IIb status post resection and radiation.  She was referred to Dr. Valeta Sandoval by Dr. Burr Sandoval, Sonia Sandoval,  for evaluation of a right upper lobe cavitary lesion . She underwent a Flexible video fiberoptic bronchoscopy with robotic assistance and biopsies on 08/31/2021.She is here for post procedure follow up.      09/17/2021 Pt. Presents for follow up.She states he has been doing well since her procedure.  She did not have any hemoptysis, she did endorse a sore throat that was self resolving. She did have a positive culture for  MYCOBACTERIUM ABSCESSUS , and she has been referred to ID for treatment. She saw Dr. Gale Sandoval yesterday. He is waiting on sensitivities to determine how to best treatment options for her.  She does have dyspnea with exertion. She states it is not that bad, but it is a change.She has been told she had COPD, but she is a never smoker. We will do PFT's to better evaluate.   Biopsies showed atypical cells from the RUL fine needle aspiration. She needs close follow up CT Chest in 3 months, and PFT's , the follow up in the pulmonary office .       Test Results: Cytology of RUL biopsy FINAL MICROSCOPIC DIAGNOSIS:  A. LUNG, RUL, BRUSH:  - Nondiagnostic material   B. LUNG, RUL, FINE NEEDLE ASPIRATION AND BIOPSIES:  - Atypical cells present   Aerobic/Anaerobic Culture w GS ABUNDANT MYCOBACTERIUM ABSCESSUS  NO ANAEROBES ISOLATED      Latest Ref Rng & Units 08/06/2021   10:00 AM 03/08/2021   10:10 AM 02/23/2021   10:34 AM  CBC  WBC 4.0 - 10.5 K/uL 5.3   3.8   4.1    Hemoglobin 12.0 - 15.0 g/dL 12.3   12.4   13.1    Hematocrit 36.0 - 46.0 % 37.3   36.9   39.8    Platelets 150 - 400 K/uL 281   216   257         Latest Ref Rng & Units 08/06/2021   10:00 AM 03/08/2021   10:10 AM 02/23/2021   10:34 AM   BMP  Glucose 70 - 99 mg/dL 89   94   72    BUN 8 - 23 mg/dL '15   13   13    '$ Creatinine 0.44 - 1.00 mg/dL 0.75   0.79   0.77    Sodium 135 - 145 mmol/L 138   140   141    Potassium 3.5 - 5.1 mmol/L 4.0   3.6   3.8    Chloride 98 - 111 mmol/L 102   107   106    CO2 22 - 32 mmol/L '29   25   25    '$ Calcium 8.9 - 10.3 mg/dL 9.3   9.0   9.6      BNP No results found for: BNP  ProBNP No results found for: PROBNP  PFT No results found for: FEV1PRE, FEV1POST, FVCPRE, FVCPOST, TLC, DLCOUNC, PREFEV1FVCRT, PSTFEV1FVCRT  DG Chest Port 1 View  Result Date: 08/31/2021 CLINICAL DATA:  Status post bronchoscopy with biopsy EXAM: PORTABLE CHEST 1 VIEW COMPARISON:  Chest radiograph 09/20/2019, CT chest 08/06/2021 FINDINGS: The cardiomediastinal silhouette is stable is within normal limits. There is confluent opacity with central lucency in the right lung apex  corresponding to the cavitary lesion seen on the prior chest CT. There is no evidence of postprocedural complication. No pneumothorax is identified. There is no other focal airspace disease. There is no pleural effusion. There is no left pneumothorax There is no acute osseous abnormality. Numerous surgical clips are noted in the axilla and chest wall. IMPRESSION: No evidence of complication following right upper lobe cavitary lesion biopsy. No pneumothorax. Electronically Signed   By: Valetta Mole M.D.   On: 08/31/2021 10:41   DG C-ARM BRONCHOSCOPY  Result Date: 08/31/2021 C-ARM BRONCHOSCOPY: Fluoroscopy was utilized by the requesting physician.  No radiographic interpretation.     Past medical hx Past Medical History:  Diagnosis Date   Allergies 1995   Per patient report 11/14/18   Anemia    prior to finding colon cancer, no longer any issues with this per patient   Breast cancer [facility])    Breast cancer, right breast ([place]) 09/2017   S/P mastectomy 12/05/2017   Colon cancer ([place])    COPD (chronic obstructive pulmonary disease) ([place])    pt  reports previous MD diagnosed her with this, but takes no medications   Dyspnea    occasionally with exertion   Family history of breast cancer    Headache 1968   per patient report, started as a teenager   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   Osteopenia 03/2018   Seen on DEXA Scan   PONV (postoperative nausea and vomiting)      Social History   Tobacco Use   Smoking status: Never   Smokeless tobacco: Never  Vaping Use   Vaping Use: Never used  Substance Use Topics   Alcohol use: Yes    Alcohol/week: 14.0 standard drinks    Types: 14 Glasses of wine per week    Comment: 2-3 3oz glasses of wine per day   Drug use: Never    Sonia Sandoval reports that she has never smoked. She has never used smokeless tobacco. She reports current alcohol use of about 14.0 standard drinks per week. She reports that she does not use drugs.  Tobacco Cessation: Never smoker    Past surgical hx, Family hx, Social hx all reviewed.  Current Outpatient Medications on File Prior to Visit  Medication Sig   Calcium Carbonate-Vitamin D (CALCIUM 600+D PO) Take 1 tablet by mouth daily.   Cholecalciferol (VITAMIN D) 50 MCG (2000 UT) tablet Take 2,000 Units by mouth daily.   gabapentin (NEURONTIN) 300 MG capsule Take 300 mg by mouth daily.   Menthol-Methyl Salicylate (SALONPAS PAIN RELIEF PATCH) PTCH Apply 1 patch topically daily as needed (pain).   naproxen sodium (ALEVE) 220 MG tablet Take 220-440 mg by mouth daily as needed (pain).   OVER THE COUNTER MEDICATION Take 1 tablet by mouth daily. Juice plus fruit blend   OVER THE COUNTER MEDICATION Take 1 capsule by mouth daily. Juice plus berry blend   OVER THE COUNTER MEDICATION Take 1 capsule by mouth daily. Juice plus veggie blend   pantoprazole (PROTONIX) 40 MG tablet Take 40 mg by mouth every morning.   Probiotic Product (PROBIOTIC PO) Take 2  capsules by mouth daily.   Pseudoephedrine-Naproxen Na (ALEVE-D SINUS & HEADACHE PO) Take 1 tablet by mouth daily as needed (allergies).   tiZANidine (ZANAFLEX) 4 MG tablet Take 4 mg by mouth daily as needed for muscle spasms.   vitamin  E 180 MG (400 UNITS) capsule Take 800 Units by mouth daily.   ALPRAZolam (XANAX) 0.5 MG tablet Take 0.5 mg by mouth daily as needed for sleep or anxiety. (Patient not taking: Reported on 09/15/2021)   exemestane (AROMASIN) 25 MG tablet TAKE 1 TABLET (25 MG TOTAL) BY MOUTH DAILY AFTER BREAKFAST. (Patient not taking: Reported on 09/15/2021)   No current facility-administered medications on file prior to visit.     Allergies  Allergen Reactions   Codeine Nausea And Vomiting and Other (See Comments)    headache    Review Of Systems:  Constitutional:   No  weight loss, night sweats,  Fevers, chills, fatigue, or  lassitude.  HEENT:   No headaches,  Difficulty swallowing,  Tooth/dental problems, or  Sore throat,                No sneezing, itching, ear ache, nasal congestion, post nasal drip,   CV:  No chest pain,  Orthopnea, PND, swelling in lower extremities, anasarca, dizziness, palpitations, syncope.   GI  No heartburn, indigestion, abdominal pain, nausea, vomiting, diarrhea, change in bowel habits, loss of appetite, bloody stools.   Resp: + shortness of breath with exertion not  at rest.  No excess mucus, no productive cough,  No non-productive cough,  No coughing up of blood.  No change in color of mucus.  No wheezing.  No chest wall deformity  Skin: no rash or lesions.  GU: no dysuria, change in color of urine, no urgency or frequency.  No flank pain, no hematuria   MS:  No joint pain or swelling.  No decreased range of motion.  No back pain.  Psych:  No change in mood or affect. No depression or anxiety.  No memory loss.   Vital Signs BP 120/74 (BP Location: Left Arm, Patient Position: Sitting, Cuff Size: Normal)   Pulse 85   Temp 97.7 F (36.5  C) (Oral)   Ht '5\' 3"'$  (1.6 m)   Wt 144 lb 3.2 oz (65.4 kg)   SpO2 99%   BMI 25.54 kg/m    Physical Exam:  General- No distress,  A&Ox3, pleasant ENT: No sinus tenderness, TM clear, pale nasal mucosa, no oral exudate,no post nasal drip, no LAN Cardiac: S1, S2, regular rate and rhythm, no murmur Chest: No wheeze/ rales/ dullness; no accessory muscle use, no nasal flaring, no sternal retractions Abd.: Soft Non-tender, ND, BS + Body mass index is 25.54 kg/m.  Ext: No clubbing cyanosis, edema Neuro:  normal strength, MAE x 4, A&O x 3 Skin: No rashes, warm and dry Psych: normal mood and behavior   Assessment/Plan Right upper lobe cavitary lesion Cultures + for ABUNDANT MYCOBACTERIUM ABSCESSUS  Plan We will plan on a 3 month follow up CT Chest without contrast to re-evaluate the nodule of concern in your right upper lobe. Follow up appointment in 3 months after repeat Ct Chest We will move your 7/12 appointment with Dr. Valeta Sandoval to an August date after your follow up Ct Chest.and PFT's at that time. We will order and schedule PFT's.  You will get a call to get these scheduled.  Follow up with ID regarding treatment of MYCOBACTERIUM ABSCESSUS  Please ask your Primary care MD to consider Autoimmune testing to see if this could be the reason for your aching joints.  Please contact office for sooner follow up if symptoms do not improve or worsen or seek emergency care     I spent 35 minutes dedicated to  the care of this patient on the date of this encounter to include pre-visit review of records, face-to-face time with the patient discussing conditions above, post visit ordering of testing, clinical documentation with the electronic health record, making appropriate referrals as documented, and communicating necessary information to the patient's healthcare team.   Magdalen Spatz, NP 09/17/2021  11:55 AM

## 2021-09-28 ENCOUNTER — Telehealth: Payer: Self-pay | Admitting: Acute Care

## 2021-09-28 NOTE — Telephone Encounter (Signed)
Left message for pt to return call to schedule f/u appt with Dr Valeta Harms and PFT at least one week after Chest CT appt on 12/17/2021. This will be a regular rov since she has seen Dr Valeta Harms previously in office.

## 2021-09-28 NOTE — Telephone Encounter (Signed)
Spoke with pt and scheduled PFT for 12/29/21 11:00 and appt with Dr Valeta Harms 12/29/21 12:00. Pt verbalized understanding.

## 2021-10-01 LAB — FUNGUS CULTURE WITH STAIN

## 2021-10-01 LAB — FUNGAL ORGANISM REFLEX

## 2021-10-01 LAB — FUNGUS CULTURE RESULT

## 2021-10-04 ENCOUNTER — Ambulatory Visit: Payer: Commercial Managed Care - PPO | Admitting: Internal Medicine

## 2021-10-08 LAB — MISC LABCORP TEST (SEND OUT)
LabCorp test name: 183802
Labcorp test code: 183802
Source (LabCorp): 183802

## 2021-10-12 LAB — AEROBIC/ANAEROBIC CULTURE W GRAM STAIN (SURGICAL/DEEP WOUND)

## 2021-10-14 LAB — ACID FAST CULTURE WITH REFLEXED SENSITIVITIES (MYCOBACTERIA)
Acid Fast Culture: NEGATIVE
Acid Fast Culture: NEGATIVE

## 2021-11-04 ENCOUNTER — Ambulatory Visit (INDEPENDENT_AMBULATORY_CARE_PROVIDER_SITE_OTHER): Payer: Commercial Managed Care - PPO | Admitting: Internal Medicine

## 2021-11-04 ENCOUNTER — Other Ambulatory Visit: Payer: Self-pay

## 2021-11-04 ENCOUNTER — Encounter: Payer: Self-pay | Admitting: Internal Medicine

## 2021-11-04 ENCOUNTER — Telehealth: Payer: Self-pay

## 2021-11-04 VITALS — BP 128/82 | HR 79 | Resp 16 | Ht 63.0 in | Wt 145.0 lb

## 2021-11-04 DIAGNOSIS — J189 Pneumonia, unspecified organism: Secondary | ICD-10-CM | POA: Diagnosis not present

## 2021-11-04 DIAGNOSIS — A319 Mycobacterial infection, unspecified: Secondary | ICD-10-CM

## 2021-11-04 DIAGNOSIS — J984 Other disorders of lung: Secondary | ICD-10-CM | POA: Diagnosis not present

## 2021-11-04 MED ORDER — AZITHROMYCIN 500 MG PO TABS: 500.0000 mg | ORAL_TABLET | Freq: Every day | ORAL | 3 refills | Status: DC

## 2021-11-04 NOTE — Patient Instructions (Signed)
Start clofazimine and azithromycin today   Once picc is placed will have home health initiate amikacin and cefoxitin intravenous   Please see one of [place] in 4-6 weeks

## 2021-11-04 NOTE — Telephone Encounter (Signed)
PICC LINE PLACEMENT ORDERED  Provider: Dr Gale Journey  End Date: 3 Months after START DATE  IV Meds: Amikacin and Cefoxitin.   Spoke to [employer] and they will set up first dose at home.  Lab results will be faxed to [facility].   Earlyne Iba in IR scheduled patient for picc placement (NO Short Stay) on 11/10/21 at 9 am.  Patient aware.   Notified: [facility]), [place], and [facility] Triage

## 2021-11-04 NOTE — Progress Notes (Signed)
[facility]  Reason for Consult:cavitary lung disease/infection Referring Provider: June Leap    Patient Active Problem List   Diagnosis Date Noted   Cavitary lesion of lung 08/08/2021   S/P laparoscopic-assisted sigmoidectomy 11/22/2019   Genetic testing 10/30/2019   Family history of breast cancer    Cancer of sigmoid colon ([place]) 09/26/2019   Iron deficiency anemia due to chronic blood loss 09/26/2019   Symptomatic anemia 09/21/2019   GI bleed 09/20/2019   Malignant neoplasm of lower-outer quadrant of right breast of female, estrogen receptor positive ([place]) 10/27/2017    Cc -- reason for consult = ntm cavitary lung disease  HPI: Sonia Sandoval is a 71 y.o. 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 Was supposed to be seen several months prior but didn't show.    5/02 underwent bronch: "The distinct navigation pathways prepared prior to this procedure were then utilized to navigate to patient's lesion identified on CT scan. The robotic catheter was secured into place and the vision probe was withdrawn.  Lesion location was approximated using fluoroscopy and radial endobronchial ultrasound for peripheral targeting. Under fluoroscopic guidance transbronchial needle brushings, transbronchial needle biopsies, and transbronchial forceps biopsies were performed to be sent for cytology and pathology. A bronchioalveolar lavage was performed in the right upper lobe and sent for microbiology.  Additional transbronchial biopsies  sent for tissue culture.   At the end of the procedure a general airway inspection was performed and there was no evidence of active bleeding. The bronchoscope was removed.  The patient tolerated the procedure well. There was no significant blood loss and there were no obvious complications. A post-procedural chest x-ray is pending."  Cytology didn't see cancer cells  On micro -- something is growing that looks like ntm and the [employer] technology calls it abscessus but it was sent on 5/8th to reference lab to identify   Patient without respiratory sx outside of stable mild dyspnea on exertion 4-5 months. She would encounter this walking up a hill No b sx Has watery eyes and a recent dry cough but query if it's allergy No reflux No decreased appetite No weight loss   11/04/21 id clinic f/u Patient is back for f/u post susceptibility testing which show azithromycin/amikacin sensitive m abscessus She continues to feel well without b sx, cough, dyspnea, chest pain She is accompanied by her significant others today    Review of Systems: ROS All other ros negative      Past Medical History:  Diagnosis Date   Allergies 1995   Per patient report 11/14/18   Anemia    prior to finding colon cancer, no longer any issues with this per patient   Breast cancer [facility])    Breast cancer, right breast ([place]) 09/2017   S/P mastectomy 12/05/2017   Colon cancer ([place])    COPD (chronic obstructive pulmonary disease) ([place])    pt reports previous MD diagnosed her with this, but takes no medications   Dyspnea    occasionally with exertion   Family history of breast  cancer    Headache 1968   per patient report, started as a teenager   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   Osteopenia 03/2018   Seen on DEXA Scan   PONV (postoperative nausea and vomiting)     Social History   Tobacco  Use   Smoking status: Never   Smokeless tobacco: Never  Vaping Use   Vaping Use: Never used  Substance Use Topics   Alcohol use: Yes    Alcohol/week: 14.0 standard drinks of alcohol    Types: 14 Glasses of wine per week    Comment: 2-3 3oz glasses of wine per day   Drug use: Never    Family History  Problem Relation Age of Onset   Dementia Mother    Lung cancer Father        smoked   Emphysema Father    Heart disease Maternal Grandfather    Cancer Maternal Aunt 60       breast cancer    Cancer Cousin        maternal first cousin with breast cancer in her 64s    Allergies  Allergen Reactions   Codeine Nausea And Vomiting and Other (See Comments)    headache    OBJECTIVE: There were no vitals filed for this visit.  There is no height or weight on file to calculate BMI.   Physical Exam General/constitutional: no distress, pleasant HEENT: Normocephalic, PER, Conj Clear, EOMI, Oropharynx clear Neck supple CV: rrr no mrg Lungs: clear to auscultation, normal respiratory effort Abd: Soft, Nontender Ext: no edema Skin: No Rash Neuro: nonfocal MSK: no peripheral joint swelling/tenderness/warmth; back spines nontender      Lab: Lab Results  Component Value Date   WBC 5.3 08/06/2021   HGB 12.3 08/06/2021   HCT 37.3 08/06/2021   MCV 89.0 08/06/2021   PLT 281 82/50/0370   Last metabolic panel Lab Results  Component Value Date   GLUCOSE 89 08/06/2021   NA 138 08/06/2021   K 4.0 08/06/2021   CL 102 08/06/2021   CO2 29 08/06/2021   BUN 15 08/06/2021   CREATININE 0.75 08/06/2021   GFRNONAA >60 08/06/2021   CALCIUM 9.3 08/06/2021   PHOS 3.6 11/14/2018   PROT 7.1 08/06/2021   ALBUMIN 4.0 08/06/2021   BILITOT 0.4 08/06/2021   ALKPHOS 96 08/06/2021   AST 24 08/06/2021   ALT 25 08/06/2021   ANIONGAP 7 08/06/2021    Microbiology:  Serology:  Imaging: I have reviewed the imaging personally and incorporated into decision making  08/06/21 chest abd pelv  ct with contrast FINDINGS: CT CHEST FINDINGS   Cardiovascular: Aortic atherosclerosis. Normal heart size. No pericardial effusion.   Mediastinum/Nodes: No enlarged mediastinal, hilar, or axillary lymph nodes. Thyroid gland, trachea, and esophagus demonstrate no significant findings.   Lungs/Pleura: Interval increase in wall thickening and cavitation within the right pulmonary apex, overall lesion now confluent and measuring 3.8 x 3.1 cm (series 4, image 24). Increased clustered centrilobular nodularity and small consolidations in this vicinity (series 4, image 37). Residual bandlike scarring in the bilateral lung bases (series 4, image 109, 122). Occasional small nodules of the left lower lobe measuring up to 0.5 cm (series 4, image 21). No pleural effusion or pneumothorax.   Musculoskeletal: No chest wall mass or suspicious osseous lesions identified. Status post bilateral mastectomy.   CT ABDOMEN PELVIS FINDINGS  Hepatobiliary: No solid liver abnormality is seen. No gallstones, gallbladder wall thickening, or biliary dilatation.   Pancreas: Unremarkable. No pancreatic ductal dilatation or surrounding inflammatory changes.   Spleen: Normal in size without significant abnormality.   Adrenals/Urinary Tract: Adrenal glands are unremarkable. Punctuate nonobstructive calculus in the inferior pole of the right kidney (series 5, image 89). No left-sided calculi, ureteral calculi, or hydronephrosis. Bladder is unremarkable.   Stomach/Bowel: Stomach is within normal limits. Appendix is not clearly visualized and may be surgically absent. The cecum is anterior to the liver. Status post sigmoid colon resection and reanastomosis. No evidence of bowel wall thickening, distention, or inflammatory changes.   Vascular/Lymphatic: Aortic atherosclerosis. No enlarged abdominal or pelvic lymph nodes.   Reproductive: No mass or other abnormality.   Other: No abdominal wall hernia or  abnormality. No ascites.   Musculoskeletal: No acute osseous findings.   IMPRESSION: 1. Status post sigmoid colon resection and reanastomosis. No evidence of lymphadenopathy or metastatic disease in the chest, abdomen, or pelvis. 2. Interval increase in wall thickening and cavitation within the right pulmonary apex, overall lesion now confluent and measuring 3.8 x 3.1 cm. Increased clustered centrilobular nodularity and small consolidations in this vicinity. Findings are consistent with ongoing, worsened cavitary infection. 3. Additional small nodules of the left lower lobe measuring up to 0.5 cm, unchanged and almost certainly benign, incidental sequelae of infection inflammation. 4. Nonobstructive right nephrolithiasis.   Assessment/plan: Problem List Items Addressed This Visit   None Visit Diagnoses     Nontuberculous atypical mycobacterial disease    -  Primary   Relevant Medications   azithromycin (ZITHROMAX) 500 MG tablet   Other Relevant Orders   IR Fluoro Guide CV Line Left   Cavitary pneumonia       Relevant Medications   azithromycin (ZITHROMAX) 500 MG tablet   Other Relevant Orders   IR Fluoro Guide CV Line Left        Discussed natural history, epidimiology, pathogenesis, diagnosis criteria and treatment indication for ntm lungs  Discussed resistant nature of abscessus, cure rate, relapse rate  At this time she is completely assymptomatic but imaging has indicated progression of cavitary lesion. On cytology there was no malignant cells. She had hx of colon cancer and breast cancer at least 3 years prior to this admission, and all are in remission  At this time culture is still pending final ID confirmation and susceptibility testing  I would treat her based on progression of ct scan  Will await susceptibility and follow up with her in around 4-6 weeks to decide on treatment regimen    7/6 assessment Doing well Due to cavitary progression will do 2  iv's and 2 po's  Initial regimen will be iv amikacin, cefoxitin and po clofazimine and azithromycin  The initial regimen will be at least 1-3 months as long as she can tolerate.  After the 3 months potentially we can keep 1 iv and the 2 PO's or transition her iv's to inhaled amikacin with the 2 po's  Our pharmacist team speaks with her today. Will start [place] and azith today  Once picc is in she can start the cefoxitin continuous daily infusion and tiw amikacin   Repeat chest ct in 6-8 weeks Repeat afb sputum x3 1 month after starting medication   OPAT Orders Discharge antibiotics to be given via PICC line Discharge antibiotics:  Amikacin 1200 mg tiw Cefoxitin 12 gram continuous daily infusion   Aim for amikacin trough <1  Duration:  3 months   End Date: Whenever these two medications are started  [place] Per Protocol:  Home health RN for IV administration and teaching; PICC line care and labs.    Labs weekly while on IV antibiotics: _x_ CBC with differential __ BMP _x_ CMP _x_ CRP  _x_ Amikacin trough three three times weekly __ ESR __ Vancomycin trough __ CK  __ Please pull PIC at completion of IV antibiotics _x_ Please leave PIC in place until doctor has seen patient or been notified  Fax weekly labs to [phone]      I have spent a total of 40 minutes of face-to-face and non-face-to-face time, excluding clinical staff time, preparing to see patient, ordering tests and/or medications, and provide counseling the patient     Follow-up: Return in about 4 weeks (around 12/02/2021).  Jabier Mutton, [facility] 11/04/2021, 2:57 PM

## 2021-11-04 NOTE — Telephone Encounter (Signed)
Noted - thank you Roselyn Reef!

## 2021-11-04 NOTE — Progress Notes (Addendum)
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 She will start azithromycin 500 mg once daily, clofazimine 100 mg once daily, amikacin IV 1200 mg TIW, and cefoxitin 12gm continuous infusion daily.   Counseled her to take one tablet of azithromycin once daily with food to help mitigate any nausea that may happen. Counseled to take TWO clofazimine capsules (100 mg) together once daily with food. Advised not to separate capsules and to make sure to take them together. Encouraged not to miss any doses and to continue taking until discontinued.  Counseled on what to do if dose is missed - if it is closer to the missed dose take immediately; if closer to next dose skip dose and take the next dose at the usual time. Counseled that side effects are usually observed on higher doses but that common side effects include GI upset with nausea and diarrhea. Counseled that clofazimine should also be taken with a full glass of water. Also counseled that medication can darken skin and other secretions such as tears, saliva, and urine. Advised to avoid direct sun exposure while on clofazimine as the medication can increase sun sensitivity.  Asked that they wear sunscreen, a hat, and long sleeves while outside.  Other common side effects include skin dryness and liver toxicity but advised that we will be monitoring liver function throughout therapy. All side effects tend to resolve after discontinuation of clofazimine.  Counseled on IV amikacin and how the process of OPAT works. Advised to administer on MWF or TThSat and that a home health nurse will come to her house to draw labs and monitor PICC line. Advised that nephrotoxicity and ototoxicity are possible side effects and that we be monitoring drug levels and her kidney function while she is on treatment. Asked her to be cognizant of any hearing changes. Also counseled on cefoxitin dosing and asked her to call with any questions  or issues. I gave her my and Amanda's card and asked her to reach out if she needs anything.  Jenefer Woerner L. Yani Lal, PharmD, BCIDP, AAHIVP, CPP Clinical Pharmacist Practitioner Infectious Diseases [facility] 11/04/2021, 3:36 PM

## 2021-11-08 ENCOUNTER — Encounter: Payer: Self-pay | Admitting: Pulmonary Disease

## 2021-11-08 ENCOUNTER — Encounter: Payer: Self-pay | Admitting: Internal Medicine

## 2021-11-10 ENCOUNTER — Telehealth: Payer: Self-pay | Admitting: Pharmacist

## 2021-11-10 ENCOUNTER — Other Ambulatory Visit: Payer: Self-pay | Admitting: Internal Medicine

## 2021-11-10 ENCOUNTER — Ambulatory Visit: Payer: Commercial Managed Care - PPO | Admitting: Pulmonary Disease

## 2021-11-10 ENCOUNTER — Ambulatory Visit (HOSPITAL_COMMUNITY)
Admission: RE | Admit: 2021-11-10 | Discharge: 2021-11-10 | Disposition: A | Discharge status: Home / Self Care | Payer: Commercial Managed Care - PPO | Source: Ambulatory Visit | Attending: Internal Medicine | Admitting: Internal Medicine

## 2021-11-10 DIAGNOSIS — J189 Pneumonia, unspecified organism: Secondary | ICD-10-CM | POA: Insufficient documentation

## 2021-11-10 DIAGNOSIS — A319 Mycobacterial infection, unspecified: Secondary | ICD-10-CM

## 2021-11-10 MED ORDER — LIDOCAINE HCL 1 % IJ SOLN
INTRAMUSCULAR | Status: AC
  Administered 2021-11-10: 10 mL
  Filled 2021-11-10: qty 20

## 2021-11-10 MED ORDER — HEPARIN SOD (PORK) LOCK FLUSH 100 UNIT/ML IV SOLN
INTRAVENOUS | Status: AC
  Administered 2021-11-10: 500 [IU]
  Filled 2021-11-10: qty 5

## 2021-11-10 NOTE — Telephone Encounter (Signed)
Carolynn Sayers from [facility] called with question for patient. She was with patient doing education on her PICC line and antibiotic infusions and patient had a question regarding alcohol interactions with her oral antibiotics. She asked if having a glass of wine here and there would be ok. I relayed information that it would be fine to occasionally have a glass of wine with her treatment.  Larinda Herter L. Lizzie An, PharmD [facility] Clinical Pharmacist Practitioner

## 2021-11-10 NOTE — Procedures (Signed)
Successful placement of single lumen PICC line to left basilic vein. Length 41 cm Tip at lower SVC/RA PICC capped No complications Ready for use.  EBL < 5 mL     Narda Rutherford, AGNP-BC 11/10/2021, 10:27 AM

## 2021-11-11 ENCOUNTER — Inpatient Hospital Stay: Payer: Commercial Managed Care - PPO | Attending: Hematology | Admitting: Hematology

## 2021-11-11 ENCOUNTER — Encounter: Payer: Self-pay | Admitting: Hematology

## 2021-11-11 DIAGNOSIS — E2839 Other primary ovarian failure: Secondary | ICD-10-CM

## 2021-11-11 MED ORDER — TAMOXIFEN CITRATE 20 MG PO TABS: 20.0000 mg | ORAL_TABLET | Freq: Every day | ORAL | 1 refills | Status: DC

## 2021-11-11 NOTE — Progress Notes (Signed)
[place]   [phone] [phone]   Clinic Follow up Note   Patient Care Team: [name], Byron as PCP - General (Nurse Practitioner) [name], MD as Consulting Physician (General Surgery) Magrinat, [name], MD (Inactive) as Consulting Physician (Oncology) [name], MD as Consulting Physician (Radiation Oncology) [name], RN as Registered Nurse [name], MD as Referring Physician (Dermatology) [name], RN (Inactive) as Oncology Nurse Navigator [name], MD as Consulting Physician (Hematology) [name], MD as Consulting Physician (General Surgery) [name], MD as Consulting Physician (Hematology) Kristeen Miss, MD as Consulting Physician (Neurosurgery)  Date of Service:  11/11/2021  I connected with Sonia Sandoval on 11/11/2021 at  2:00 PM EDT by telephone visit and verified that I am speaking with the correct person using two identifiers.  I discussed the limitations, risks, security and privacy concerns of performing an evaluation and management service by telephone and the availability of in person appointments. I also discussed with the patient that there may be a patient responsible charge related to this service. The patient expressed understanding and agreed to proceed.   Other persons participating in the visit and their role in the encounter:  patient's husband, [name]  Patient's location:  home Provider's location:  my office  CHIEF COMPLAINT: f/u of colon cancer, h/o breast cancer  CURRENT THERAPY:  Surveillance for colon cancer Anastrozole since 04/2018. Switched to Exemestane in 08/2020 due to joint pain. Held in 07/2021 due to arthralgia  ASSESSMENT & PLAN:  Sonia Sandoval is a 71 y.o. 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 -Her repeated CT Chest from 04/16/20 showed her known indeterminate right upper lobe nodularity is stable, and likely benign, but there is new 1.9cm consolidation in the anterior right lower lobe. radiology feels this is possible inflammation. -Her PET from 07/01/20 showed hypermetabolic new 3.3 cm mass in anterior right lower lobe, enlarging from previous CT scan, highly concerning for malignancy. Other pulmonary nodules are stable, no hypermetabolic adenopathy. I referred to IR for biopsy, but it was cancelled by Dr. Pascal Lux due to the smaller size on biopsy CT  -She underwent bronchoscopy on Aug 31, 2021, BAL showed Mycobacterium abscessus.  She was seen by ID, and started IV and oral antibiotics -Follow-up with pulmonary, she has a repeated CT scan next months.   2. Colon Cancer, pT3N0M0, stage II -She was diagnosed in 08/2019. She is found to have malignant tumor in recto-sigmoid and biopsy confirmed adenocarcinoma. -She proceeded with colon surgery with Dr Dema Severin and Dr Marcello Moores on 11/22/19.  -Adjuvant chemotherapy was not recommended -GuardianReveal (circulating tumor DNA) in December 2021 was negative. Given insurance did not cover this, she is not interested in repeating this test.  -plan to repeat CT scan in 06/2021   3. H/o Right Breast Cancer, G3, T2N1, ER+/PR+/HER-, Osteopenia, arthralgia -She was diagnosed in 09/2017. She was treated with B/l mastectomies by Dr Brantley Stage and adjuvant radiation by Dr Lisbeth Renshaw -She started on Anastrozole in 04/2018.  -She is being followed by Dr Jana Hakim.   -Her 04/21/20 DEXA showed osteopenia -1.8 at right total femur, with low risk for fracture. Repeat every 2 years. -We switched her to exemestane in 08/2020 due to concerns  with joint pain.  It was held due to her worsening joint pain.  Her arthralgia did not improve after stopping exemestane, and was  felt to be related to osteoarthritis.  She may need a hip replacement next year.  She is doing physical therapy now. -We discussed switching her to tamoxifen.  Due to multiple oral and IV antibiotics, she prefers to hold it for now.   4. 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   No problem-specific Assessment & Plan notes found for this encounter.     SUMMARY OF ONCOLOGIC HISTORY: Oncology History Overview Note  Cancer Staging Cancer of sigmoid colon (HCC) Staging form: Colon and Rectum, AJCC 8th Edition - Pathologic stage from 11/22/2019: Stage IIA (pT3, pN0, cM0) - Signed by Feng, Yan, MD on 12/17/2019  Malignant neoplasm of lower-outer quadrant of right breast of female, estrogen receptor positive (HCC) Staging form: Breast, AJCC 8th Edition - Clinical: Stage IIB (cT2, cN1, cM0, G3, ER+, PR+, HER2-) - Unsigned - Pathologic: Stage IIA (pT2, pN1(sn), cM0, G3, ER+, PR+, HER2-) - Unsigned    Malignant neoplasm of lower-outer quadrant of right breast of female, estrogen receptor positive (HCC)  10/23/2017 Initial Diagnosis    right breast lower outer quadrant biopsy 10/23/2017 for a clinical T2 N1, stage IIB invasive ductal carcinoma, grade 3, estrogen and progesterone receptor positive, HER-2 not amplified, with an MIB-1 of 20%   11/07/2017 Miscellaneous   Mammaprint on was read as low risk, predicting no significant chemotherapy benefit with a 5-year distant disease-free survival in the 97-98% range with hormone therapy alone   12/05/2017 Surgery   status post bilateral mastectomies on 12/05/2017 showing             (a) on the left, no evidence of malignancy             (b) on the right, a pT2 pN1, stage IIB invasive ductal  carcinoma, grade 3, with negative margins. By Dr Cornett   01/15/2018 - 02/28/2018 Radiation Therapy   adjuvant radiation 01/15/2018 - 02/28/2018 with Dr Moody              Site/dose: The patient initially received a dose of 50.4 Gy in 28 fractions to the chest wall and supraclavicular region. This was delivered using a 3-D conformal, 4 field technique. The patient then received a boost to the mastectomy scar. This delivered an additional 10 Gy in 5 fractions using an en face electron field. The total dose was 60.4 Gy.    04/01/2018 -  Anti-estrogen oral therapy   started anastrozole in 04/2018. Switched to Exemestane in 08/2020 due to joint pain.             (a) bone density 03/07/2018 with a T score of -1.8. Her 04/21/20 DEXA showed osteopenia -1.8 at right total femur, with low risk for fracture.   10/28/2019 Genetic Testing   Negative genetic testing.  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  The Common Hereditary Gene Panel offered by [facility] includes sequencing and/or deletion duplication testing of the following 48 genes: APC, ATM, AXIN2, BARD1, BMPR1A, BRCA1, BRCA2, BRIP1, CDH1, CDK4, CDKN2A (p14ARF), CDKN2A (p16INK4a), CHEK2, CTNNA1, DICER1, EPCAM (Deletion/duplication testing only), GREM1 (promoter region deletion/duplication   testing only), KIT, MEN1, MLH1, MSH2, MSH3, MSH6, MUTYH, NBN, NF1, NHTL1, PALB2, PDGFRA, PMS2, POLD1, POLE, PTEN, RAD50, RAD51C, RAD51D, RNF43, SDHB, SDHC, SDHD, SMAD4, SMARCA4. STK11, TP53, TSC1, TSC2, and VHL.  The following genes were evaluated for sequence changes only: SDHA and HOXB13 c.251G>A variant only. The report date is October 28, 2019.   03/08/2021 Imaging   CT Chest w/o contrast  IMPRESSION: 1. The previous FDG avid lung mass within the anterior right lower lobe has decreased in size compared with previous exam compatible with response to therapy. The cavitary lung nodule within the right apex is slightly increased in size from  previous exam. Additionally, there is a new irregular nodule within the medial right upper lobe measuring 6 mm. Previously 3 mm. 2. Interval increase in size of cavitary nodule within the right lung apex. This measures 2.3 x 1.5 cm, compared with 1.3 x 0.9 cm previously. Underlying malignancy cannot be excluded. 3. New small nodule within the right upper lobe measures 6 mm. This has a nonspecific appearance but warrants attention on follow-up imaging. 4. The remaining small pulmonary nodules noted previously are stable in the interval. 5. Coronary artery calcifications noted. 6. Aortic Atherosclerosis (ICD10-I70.0).   Cancer of sigmoid colon (HCC)  09/23/2019 Tumor Marker   Baseline CEA 1.4 on 09/23/19    09/23/2019 Procedure   Colonoscopy and Upper endoscopy with Dr Brahmbhatt 09/23/19  Upper Endoscopy Impression - Z-line regular, 35 cm from the incisors. - Small hiatal hernia. - Non-bleeding gastric ulcer. Biopsied. - Hematin (altered blood/coffee-ground-like material) in the gastric body. - Normal duodenal bulb, first portion of the duodenum and second portion of - Normal duodenal bulb, first portion of the duodenum and second portion of the duodenum.  Colonoscopy Impression - Preparation of the colon was poor. - Perianal skin tags found on perianal exam. - Likely malignant tumor in the recto-sigmoid colon. Biopsied. Tattooed. - Internal hemorrhoids.   09/23/2019 Initial Biopsy   FINAL MICROSCOPIC DIAGNOSIS: 09/23/19 A. STOMACH, ULCER, BIOPSY:  - Gastric antral mucosa with mild nonspecific reactive gastropathy  - Warthin Starry stain is negative for Helicobacter pylori   B. COLON, RECTOSIGMOID, MASS, BIOPSY:  - Adenocarcinoma, see comment  COMMENT:   B.  Immunohistochemical stains for MMR-related proteins are pending and  will be reported in an addendum. Dr. Patrick reviewed the case and  concurs with the diagnosis. Dr. Brahmbhatt was notified on 09/24/2019.     ADDENDUM:   Mismatch Repair Protein (IHC)  SUMMARY INTERPRETATION: NORMAL  There is preserved expression of the major MMR proteins. There is a very  low probability that microsatellite instability (MSI) is present.  However, certain clinically significant MMR protein mutations may result  in preservation of nuclear expression. It is recommended that the  preservation of protein expression be correlated with molecular based  MSI testing.   IHC EXPRESSION RESULTS  TEST           RESULT  MLH1:          Preserved nuclear expression  MSH2:          Preserved nuclear expression  MSH6:          Preserved nuclear expression  PMS2:          Preserved nuclear expression    09/23/2019 Imaging   CT CAP W Contrast 09/23/19  IMPRESSION: 1. There is eccentric wall thickening of the rectosigmoid colon, in keeping with mass identified and biopsied by colonoscopy.   2. There are prominent subcentimeter lymph   nodes in the sigmoid mesocolon, suspicious for nodal metastatic disease. There is no other lymphadenopathy in the abdomen or pelvis.   3. There are multiple small bilateral pulmonary nodules measuring up to 4 mm, nonspecific although very suspicious for pulmonary metastatic disease. Given history of breast malignancy, previous imaging of the chest would be very helpful to assess for stability. This report may be addended for comparison if prior imaging can be obtained.   4. Status post bilateral mastectomy with irregular subpleural opacity of the anterior right upper lobe, consistent with history of breast malignancy and radiation fibrosis.   5. There is adjacent nodularity of the right pulmonary apex, the largest discrete nodular component measuring up to 1.1 cm. This is more favored infectious or inflammatory although nonspecific and metastatic disease is not excluded. As as above, prior imaging would be very helpful to assess.   6. Small focus of nonspecific infectious or inflammatory  airspace disease of the perihilar left upper lobe.   09/26/2019 Initial Diagnosis   Cancer of sigmoid colon (HCC)   10/28/2019 PET scan   IMPRESSION: 1. Increased tracer uptake within the pelvis corresponding to wall thickening of the rectosigmoid colon favored to represent primary colorectal carcinoma. No findings of FDG avid nodal metastasis or solid organ metastasis within the abdomen or pelvis. 2. Within the right lung apex there is a cluster of 3 subpleural nodular densities which exhibit varying degrees of mild FDG uptake with SUV max between 1.9 and 3.1. Morphologically the appearance is suggestive of benign postinflammatory pleuroparenchymal scarring. This would be an atypical pattern of metastatic disease, which is considered less favored, but not excluded. Attention in this area on serial follow-up imaging is advised. 3. Two small, less than 5 mm left lower lobe lung nodules are too small to reliably characterize. 4.  Aortic Atherosclerosis (ICD10-I70.0). 5. Nonobstructing right renal calculus.     11/22/2019 Cancer Staging   Staging form: Colon and Rectum, AJCC 8th Edition - Pathologic stage from 11/22/2019: Stage IIA (pT3, pN0, cM0) - Signed by Feng, Yan, MD on 12/17/2019   11/22/2019 Surgery   XI ROBOT ASSISTED SIGMOIDECTOMY and FLEXIBLE SIGMOIDOSCOPY by Dr white and Dr Thomas   11/22/2019 Pathology Results   FINAL MICROSCOPIC DIAGNOSIS:   A. SIGMOID COLON, RESECTION:  - Invasive moderately differentiated adenocarcinoma, 4.5 cm, involving distal sigmoid colon  - Carcinoma focally invades into pericolonic soft tissue  - Resection margins are negative for carcinoma  - Negative for lymphovascular or perineural invasion  - Eighteen benign lymph nodes (0/18)  - See oncology table   B. FINAL DISTAL MARGIN, EXCISION:  - Colonic donut, negative for carcinoma    04/15/2020 Imaging   CT Chest  IMPRESSION: 1. New nodular 1.9 cm focus of consolidation in the anterior  right lower lobe with surrounding mild patchy tree-in-bud opacity, nonspecific but favoring infectious/inflammatory etiology. 2. Clustered nodularity in the apical right upper lobe is stable in size and newly cavitary. These findings are indeterminate for atypical infection such as MAI versus response to therapy of pulmonary metastases. Additional scattered tiny left pulmonary nodules are stable. Suggest close chest CT follow-up in 3 months. 3. No thoracic adenopathy. 4. Stable dilated main pulmonary artery, suggesting chronic pulmonary arterial hypertension. 5. Aortic Atherosclerosis (ICD10-I70.0).   04/17/2020 Tumor Marker   Her Guardant Reveal showed negative - ctDNA not detected.    07/08/2020 Imaging   CT Chest  IMPRESSION: Slight reduction in size of hypermetabolic right upper lobe pulmonary nodule, nonspecific though favored to represent   area of infection/inflammation as was initially suggested on chest CT performed 04/15/2020. No biopsy attempted at this time.   03/08/2021 Imaging   CT Chest w/o contrast  IMPRESSION: 1. The previous FDG avid lung mass within the anterior right lower lobe has decreased in size compared with previous exam compatible with response to therapy. The cavitary lung nodule within the right apex is slightly increased in size from previous exam. Additionally, there is a new irregular nodule within the medial right upper lobe measuring 6 mm. Previously 3 mm. 2. Interval increase in size of cavitary nodule within the right lung apex. This measures 2.3 x 1.5 cm, compared with 1.3 x 0.9 cm previously. Underlying malignancy cannot be excluded. 3. New small nodule within the right upper lobe measures 6 mm. This has a nonspecific appearance but warrants attention on follow-up imaging. 4. The remaining small pulmonary nodules noted previously are stable in the interval. 5. Coronary artery calcifications noted. 6. Aortic Atherosclerosis (ICD10-I70.0).       INTERVAL HISTORY:  Sonia Sandoval was contacted for a follow up of colon cancer and lung issues. She has developed more cough and dyspnea on exertion. She underwent bronchoscopy on Aug 31, 2021, which unfortunately showed Mycobacterium abscessus in BAL.  She was referred to ID, and just started oral and IV antibiotics, she will be IV antibiotics for 3 to 4 months, and oral antibiotics for more than a year.  She has stopped exemestane since last visit 3 months ago, her arthralgia in her hips did not improve, she was seen by orthopedics, arthralgia felt to be related to her osteoarthritis, she plans to have a hip replacement next year, she is doing physical therapy now.  She denies any other new pain or other new symptoms.  All other systems were reviewed with the patient and are negative.  MEDICAL HISTORY:  Past Medical History:  Diagnosis Date   Allergies 1995   Per patient report 11/14/18   Anemia    prior to finding colon cancer, no longer any issues with this per patient   Breast cancer [facility])    Breast cancer, right breast ([place]) 09/2017   S/P mastectomy 12/05/2017   Colon cancer ([place])    COPD (chronic obstructive pulmonary disease) ([place])    pt reports previous MD diagnosed her with this, but takes no medications   Dyspnea    occasionally with exertion   Family history of breast cancer    Headache 1968   per patient report, started as a teenager   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   Osteopenia 03/2018   Seen on DEXA Scan   PONV (postoperative nausea and vomiting)     SURGICAL HISTORY: Past Surgical History:  Procedure Laterality Date   BACK SURGERY  2003   Patient will occassional back pain since 2003   BIOPSY  09/23/2019   Procedure: BIOPSY;  Surgeon: Otis Brace, MD;  Location: [facility];  Service: Gastroenterology;;  EGD and COLON   BREAST BIOPSY  Right 09/2017   BRONCHIAL BIOPSY  08/31/2021   Procedure: BRONCHIAL BIOPSIES;  Surgeon: Garner Nash, DO;  Location: [facility];  Service: Pulmonary;;   BRONCHIAL BRUSHINGS  08/31/2021   Procedure: BRONCHIAL BRUSHINGS;  Surgeon: Garner Nash, DO;  Location: [facility];  Service: Pulmonary;;   BRONCHIAL NEEDLE ASPIRATION BIOPSY  08/31/2021   Procedure: BRONCHIAL NEEDLE ASPIRATION BIOPSIES;  Surgeon: Icard, Bradley L, DO;  Location: [facility];  Service: Pulmonary;;   BRONCHIAL WASHINGS  08/31/2021   Procedure: BRONCHIAL WASHINGS;  Surgeon: Icard, Bradley L, DO;  Location: [facility];  Service: Pulmonary;;   CESAREAN SECTION  1985; 1992   COLONOSCOPY WITH PROPOFOL N/A 09/23/2019   Procedure: COLONOSCOPY WITH PROPOFOL and EGD;  Surgeon: Brahmbhatt, Parag, MD;  Location: [facility];  Service: Gastroenterology;  Laterality: N/A;   DILATION AND CURETTAGE OF UTERUS     ESOPHAGOGASTRODUODENOSCOPY N/A 09/23/2019   Procedure: ESOPHAGOGASTRODUODENOSCOPY (EGD);  Surgeon: Brahmbhatt, Parag, MD;  Location: [facility];  Service: Gastroenterology;  Laterality: N/A;   FLEXIBLE SIGMOIDOSCOPY N/A 11/22/2019   Procedure: FLEXIBLE SIGMOIDOSCOPY;  Surgeon: White, Christopher M, MD;  Location: [facility];  Service: General;  Laterality: N/A;   LUMBAR DISC SURGERY  2003   MASTECTOMY Left 12/05/2017   PROPHYLACTIC MASTECTOMY   MASTECTOMY COMPLETE / SIMPLE W/ SENTINEL NODE BIOPSY Right 12/05/2017   WITH RADIOACTIVE SEED TARGETED RIGHT AXIILARY LYMPH NODE EXCISION AND RIGHT SENTINEL LYMPH NODE BIOPSY,    MASTECTOMY WITH RADIOACTIVE SEED GUIDED EXCISION AND AXILLARY SENTINEL LYMPH NODE BIOPSY Bilateral 12/05/2017   Procedure: RIGHT SIMPLE MASTECTOMY WITH RADIOACTIVE SEED TARGETED RIGHT AXIILARY LYMPH NODE EXCISION AND RIGHT SENTINEL LYMPH NODE BIOPSY, LEFT PROPHYLACTIC MASTECTOMY;  Surgeon: Cornett, Thomas, MD;  Location: [facility];  Service: General;  Laterality: Bilateral;   SUBMUCOSAL TATTOO INJECTION  09/23/2019    Procedure: SUBMUCOSAL TATTOO INJECTION;  Surgeon: Brahmbhatt, Parag, MD;  Location: [facility];  Service: Gastroenterology;;   VIDEO BRONCHOSCOPY WITH RADIAL ENDOBRONCHIAL ULTRASOUND  08/31/2021   Procedure: RADIAL ENDOBRONCHIAL ULTRASOUND;  Surgeon: Icard, Bradley L, DO;  Location: [facility];  Service: Pulmonary;;    I have reviewed the social history and family history with the patient and they are unchanged from previous note.  ALLERGIES:  is allergic to codeine.  MEDICATIONS:  Current Outpatient Medications  Medication Sig Dispense Refill   ALPRAZolam (XANAX) 0.5 MG tablet Take 0.5 mg by mouth daily as needed for sleep or anxiety. (Patient not taking: Reported on 09/15/2021)     azithromycin (ZITHROMAX) 500 MG tablet Take 1 tablet (500 mg total) by mouth daily. 90 tablet 3   Calcium Carbonate-Vitamin D (CALCIUM 600+D PO) Take 1 tablet by mouth daily.     Cholecalciferol (VITAMIN D) 50 MCG (2000 UT) tablet Take 2,000 Units by mouth daily.     exemestane (AROMASIN) 25 MG tablet TAKE 1 TABLET (25 MG TOTAL) BY MOUTH DAILY AFTER BREAKFAST. (Patient not taking: Reported on 09/15/2021) 90 tablet 3   gabapentin (NEURONTIN) 300 MG capsule Take 300 mg by mouth daily.     Menthol-Methyl Salicylate (SALONPAS PAIN RELIEF PATCH) PTCH Apply 1 patch topically daily as needed (pain).     naproxen sodium (ALEVE) 220 MG tablet Take 220-440 mg by mouth daily as needed (pain).     OVER THE COUNTER MEDICATION Take 1 tablet by mouth daily. Juice plus fruit blend     OVER THE COUNTER MEDICATION Take 1 capsule by mouth daily. Juice plus berry blend     OVER THE COUNTER MEDICATION Take 1 capsule by mouth daily. Juice plus veggie blend     pantoprazole (PROTONIX) 40 MG tablet Take 40 mg by mouth every morning.     Probiotic Product (PROBIOTIC PO) Take 2 capsules by mouth daily.     Pseudoephedrine-Naproxen Na (ALEVE-D SINUS & HEADACHE PO) Take 1 tablet by mouth daily as needed (allergies).     tiZANidine  (ZANAFLEX) 4   MG tablet Take 4 mg by mouth daily as needed for muscle spasms.     vitamin E 180 MG (400 UNITS) capsule Take 800 Units by mouth daily.     No current facility-administered medications for this visit.    PHYSICAL EXAMINATION: ECOG PERFORMANCE STATUS: 0 - Asymptomatic  There were no vitals filed for this visit. Wt Readings from Last 3 Encounters:  11/04/21 145 lb (65.8 kg)  09/17/21 144 lb 3.2 oz (65.4 kg)  09/15/21 144 lb (65.3 kg)     No vitals taken today, Exam not performed today  LABORATORY DATA:  I have reviewed the data as listed    Latest Ref Rng & Units 08/06/2021   10:00 AM 03/08/2021   10:10 AM 02/23/2021   10:34 AM  CBC  WBC 4.0 - 10.5 K/uL 5.3  3.8  4.1   Hemoglobin 12.0 - 15.0 g/dL 12.3  12.4  13.1   Hematocrit 36.0 - 46.0 % 37.3  36.9  39.8   Platelets 150 - 400 K/uL 281  216  257         Latest Ref Rng & Units 08/06/2021   10:00 AM 03/08/2021   10:10 AM 02/23/2021   10:34 AM  CMP  Glucose 70 - 99 mg/dL 89  94  72   BUN 8 - 23 mg/dL 15  13  13   Creatinine 0.44 - 1.00 mg/dL 0.75  0.79  0.77   Sodium 135 - 145 mmol/L 138  140  141   Potassium 3.5 - 5.1 mmol/L 4.0  3.6  3.8   Chloride 98 - 111 mmol/L 102  107  106   CO2 22 - 32 mmol/L 29  25  25   Calcium 8.9 - 10.3 mg/dL 9.3  9.0  9.6   Total Protein 6.5 - 8.1 g/dL 7.1  6.8  6.8   Total Bilirubin 0.3 - 1.2 mg/dL 0.4  0.4  0.6   Alkaline Phos 38 - 126 U/L 96  66  62   AST 15 - 41 U/L 24  29  22   ALT 0 - 44 U/L 25  23  22       RADIOGRAPHIC STUDIES: I have personally reviewed the radiological images as listed and agreed with the findings in the report. IR PICC PLACEMENT LEFT >5 YRS INC IMG GUIDE  Result Date: 11/10/2021 INDICATION: Patient with history of nontuberculous mycobacterial lung infection. Patient was referred to IR by ID for PICC placement for long-term antibiotic therapy. EXAM: ULTRASOUND AND FLUOROSCOPIC GUIDED PICC LINE INSERTION MEDICATIONS: 1% lidocaine CONTRAST:  None  FLUOROSCOPY TIME:  (3 mGy) COMPLICATIONS: None immediate. TECHNIQUE: The procedure, risks, benefits, and alternatives were explained to the patient and informed written consent was obtained. The left upper extremity was prepped with chlorhexidine in a sterile fashion, and a sterile drape was applied covering the operative field. Maximum barrier sterile technique with sterile gowns and gloves were used for the procedure. A timeout was performed prior to the initiation of the procedure. Local anesthesia was provided with 1% lidocaine. After the overlying soft tissues were anesthetized with 1% lidocaine, a micropuncture kit was utilized to access the left basilic vein. Real-time ultrasound guidance was utilized for vascular access including the acquisition of a permanent ultrasound image documenting patency of the accessed vessel. A guidewire was advanced to the level of the superior caval-atrial junction for measurement purposes and the PICC line was cut to length. A peel-away sheath was placed and a 41   cm, 5 French, single lumen was inserted to level of the superior caval-atrial junction. A post procedure spot fluoroscopic was obtained. The catheter easily aspirated and flushed and was secured in place. A dressing was placed. The patient tolerated the procedure well without immediate post procedural complication. FINDINGS: After catheter placement, the tip lies within the superior cavoatrial junction. The catheter aspirates and flushes normally and is ready for immediate use. IMPRESSION: Successful ultrasound and fluoroscopic guided placement of a left basilic vein approach, 41 cm, 5 French, single lumen PICC with tip at the superior caval-atrial junction. The PICC line is ready for immediate use. Read by: Susan Clapp, AGNP-BC Electronically Signed   By: Heath  McCullough M.D.   On: 11/10/2021 16:40      No orders of the defined types were placed in this encounter.   All questions were answered. The patient  knows to call the clinic with any problems, questions or concerns. No barriers to learning was detected. The total time spent in the appointment was 22 minutes.     Yan Feng, MD 11/11/2021    

## 2021-11-12 ENCOUNTER — Telehealth: Payer: Self-pay | Admitting: Hematology

## 2021-11-12 NOTE — Telephone Encounter (Signed)
Left message with follow-up appointment per 7/13 los.

## 2021-11-15 LAB — MISC LABCORP TEST (SEND OUT): Labcorp test code: 183510

## 2021-11-23 ENCOUNTER — Telehealth: Payer: Self-pay | Admitting: Pharmacist

## 2021-11-23 DIAGNOSIS — A319 Mycobacterial infection, unspecified: Secondary | ICD-10-CM

## 2021-11-23 MED ORDER — AMBULATORY NON FORMULARY MEDICATION: 100.0000 mg | Freq: Every day | 1 refills | Status: DC

## 2021-11-23 NOTE — Telephone Encounter (Signed)
Completed online application for clofazimine with [employer]. Will update Dr. Gale Journey when approval status has been decided. 2 bottles were given to patient on 11/04/21.  Juniel Groene L. Eber Hong, PharmD, BCIDP, AAHIVP, CPP Clinical Pharmacist Practitioner Infectious Diseases [place] [facility] 11/23/2021, 11:54 AM

## 2021-11-24 ENCOUNTER — Emergency Department (HOSPITAL_BASED_OUTPATIENT_CLINIC_OR_DEPARTMENT_OTHER)
Admission: EM | Admit: 2021-11-24 | Discharge: 2021-11-24 | Disposition: A | Discharge status: Home / Self Care | Payer: Commercial Managed Care - PPO | Attending: Emergency Medicine | Admitting: Emergency Medicine

## 2021-11-24 ENCOUNTER — Telehealth: Payer: Self-pay | Admitting: Pharmacist

## 2021-11-24 ENCOUNTER — Encounter (HOSPITAL_BASED_OUTPATIENT_CLINIC_OR_DEPARTMENT_OTHER): Payer: Self-pay | Admitting: Obstetrics and Gynecology

## 2021-11-24 ENCOUNTER — Encounter: Payer: Self-pay | Admitting: Pharmacist

## 2021-11-24 ENCOUNTER — Encounter: Payer: Self-pay | Admitting: Hematology

## 2021-11-24 ENCOUNTER — Other Ambulatory Visit: Payer: Self-pay

## 2021-11-24 ENCOUNTER — Other Ambulatory Visit (HOSPITAL_COMMUNITY): Payer: Self-pay

## 2021-11-24 DIAGNOSIS — R899 Unspecified abnormal finding in specimens from other organs, systems and tissues: Secondary | ICD-10-CM | POA: Diagnosis not present

## 2021-11-24 DIAGNOSIS — J449 Chronic obstructive pulmonary disease, unspecified: Secondary | ICD-10-CM | POA: Diagnosis not present

## 2021-11-24 DIAGNOSIS — R197 Diarrhea, unspecified: Secondary | ICD-10-CM | POA: Diagnosis present

## 2021-11-24 DIAGNOSIS — A319 Mycobacterial infection, unspecified: Secondary | ICD-10-CM

## 2021-11-24 LAB — COMPREHENSIVE METABOLIC PANEL
ALT: 35 U/L (ref 0–44)
AST: 34 U/L (ref 15–41)
Albumin: 4.1 g/dL (ref 3.5–5.0)
Alkaline Phosphatase: 73 U/L (ref 38–126)
Anion gap: 11 (ref 5–15)
BUN: 16 mg/dL (ref 8–23)
CO2: 24 mmol/L (ref 22–32)
Calcium: 10 mg/dL (ref 8.9–10.3)
Chloride: 102 mmol/L (ref 98–111)
Creatinine, Ser: 0.86 mg/dL (ref 0.44–1.00)
GFR, Estimated: >60 mL/min (ref >60)
Glucose, Bld: 92 mg/dL (ref 70–99)
Potassium: 4.1 mmol/L (ref 3.5–5.1)
Sodium: 137 mmol/L (ref 135–145)
Total Bilirubin: 0.4 mg/dL (ref 0.3–1.2)
Total Protein: 6.4 g/dL — ABNORMAL LOW (ref 6.5–8.1)

## 2021-11-24 LAB — CBC
HCT: 35.8 % — ABNORMAL LOW (ref 36.0–46.0)
Hemoglobin: 12 g/dL (ref 12.0–15.0)
MCH: 29.6 pg (ref 26.0–34.0)
MCHC: 33.5 g/dL (ref 30.0–36.0)
MCV: 88.2 fL (ref 80.0–100.0)
Platelets: 219 10*3/uL (ref 150–400)
RBC: 4.06 MIL/uL (ref 3.87–5.11)
RDW: 13.1 % (ref 11.5–15.5)
WBC: 5.5 10*3/uL (ref 4.0–10.5)
nRBC: 0 % (ref 0.0–0.2)

## 2021-11-24 LAB — LIPASE, BLOOD: Lipase: 21 U/L (ref 11–51)

## 2021-11-24 MED ORDER — LINEZOLID 600 MG PO TABS: 600.0000 mg | ORAL_TABLET | Freq: Every day | ORAL | 2 refills | Status: DC

## 2021-11-24 NOTE — ED Triage Notes (Signed)
Patient reports to the ER for diarrhea x1.5 weeks. Patient reports every time she pees she has diarrhea. Patient states she was told to come to the ER as she recently had labs done and had elevated kidney function

## 2021-11-24 NOTE — ED Provider Notes (Signed)
[facility] Provider Note   CSN: [id number] Arrival date & time: 11/24/21  1208     History  Chief Complaint  Patient presents with   Diarrhea    S[facility]oval is a 71 y.o. female.  Patient is a 71 year old female with a history of COPD, anemia, currently has significant respiratory infection on PICC line antibiotics receiving linezolid and amikacin at home who is presenting today because she was called by infectious disease office reporting that her kidney function was elevated and there was concern for dehydration and she needed to come to the hospital.  Patient reports for the last 1.5 weeks she has been having diarrhea pretty much every time she goes to the bathroom which started about 4 to 5 days after that IV antibiotics.  However she has been eating and drinking.  She denies fever, abdominal pain.  She has not had any vomiting.  The history is provided by the patient and medical records.  Diarrhea      Home Medications Prior to Admission medications   Medication Sig Start Date End Date Taking? Authorizing Provider  ALPRAZolam Duanne Moron) 0.5 MG tablet Take 0.5 mg by mouth daily as needed for sleep or anxiety. Patient not taking: Reported on 09/15/2021 04/05/21   [provider]  AMBULATORY NON FORMULARY MEDICATION Take 100 mg by mouth daily. Medication Name: Clofazimine 11/23/21   Kuppelweiser, Cassie L, RPH-CPP  azithromycin (ZITHROMAX) 500 MG tablet Take 1 tablet (500 mg total) by mouth daily. 11/04/21 10/30/22  Vu, Rockey Situ, MD  Calcium Carbonate-Vitamin D (CALCIUM 600+D PO) Take 1 tablet by mouth daily.    [provider]  Cholecalciferol (VITAMIN D) 50 MCG (2000 UT) tablet Take 2,000 Units by mouth daily.    [provider]  exemestane (AROMASIN) 25 MG tablet TAKE 1 TABLET (25 MG TOTAL) BY MOUTH DAILY AFTER BREAKFAST. Patient not taking: Reported on 09/15/2021 01/11/21   [name], MD  gabapentin (NEURONTIN) 300 MG capsule  Take 300 mg by mouth daily. 07/07/21   [provider]  linezolid (ZYVOX) 600 MG tablet Take 1 tablet (600 mg total) by mouth daily. 11/24/21   Kuppelweiser, Cassie L, RPH-CPP  Menthol-Methyl Salicylate (SALONPAS PAIN RELIEF PATCH) PTCH Apply 1 patch topically daily as needed (pain).    [provider]  naproxen sodium (ALEVE) 220 MG tablet Take 220-440 mg by mouth daily as needed (pain).    [provider]  OVER THE COUNTER MEDICATION Take 1 tablet by mouth daily. Juice plus fruit blend    [provider]  OVER THE COUNTER MEDICATION Take 1 capsule by mouth daily. Juice plus berry blend    [provider]  OVER THE COUNTER MEDICATION Take 1 capsule by mouth daily. Juice plus veggie blend    [provider]  pantoprazole (PROTONIX) 40 MG tablet Take 40 mg by mouth every morning. 09/17/20   [provider]  Probiotic Product (PROBIOTIC PO) Take 2 capsules by mouth daily.    [provider]  Pseudoephedrine-Naproxen Na (ALEVE-D SINUS & HEADACHE PO) Take 1 tablet by mouth daily as needed (allergies).    [provider]  tiZANidine (ZANAFLEX) 4 MG tablet Take 4 mg by mouth daily as needed for muscle spasms.    [provider]  vitamin E 180 MG (400 UNITS) capsule Take 800 Units by mouth daily.    [provider]      Allergies    Codeine    Review of Systems  Review of Systems  Gastrointestinal:  Positive for diarrhea.    Physical Exam Updated Vital Signs BP (!) 145/86 (BP Location: Right Arm)   Pulse 77   Temp 98.1 F (36.7 C) (Oral)   Resp 16   Ht '5\' 3"'$  (1.6 m)   Wt 64.4 kg   SpO2 99%   BMI 25.15 kg/m  Physical Exam Vitals and nursing note reviewed.  Constitutional:      General: She is not in acute distress.    Appearance: She is well-developed.  HENT:     Head: Normocephalic and atraumatic.  Eyes:     Pupils: Pupils are equal, round, and reactive to light.  Cardiovascular:      Rate and Rhythm: Normal rate and regular rhythm.     Heart sounds: Normal heart sounds. No murmur heard.    No friction rub.  Pulmonary:     Effort: Pulmonary effort is normal.     Breath sounds: Normal breath sounds. No wheezing or rales.  Abdominal:     General: Bowel sounds are normal. There is no distension.     Palpations: Abdomen is soft.     Tenderness: There is no abdominal tenderness. There is no guarding or rebound.  Musculoskeletal:        General: No tenderness. Normal range of motion.     Comments: No edema.  PICC line present in the left upper arm  Skin:    General: Skin is warm and dry.     Findings: No rash.  Neurological:     Mental Status: She is alert and oriented to person, place, and time. Mental status is at baseline.     Cranial Nerves: No cranial nerve deficit.  Psychiatric:        Mood and Affect: Mood normal.        Behavior: Behavior normal.     ED Results / Procedures / Treatments   Labs (all labs ordered are listed, but only abnormal results are displayed) Labs Reviewed  COMPREHENSIVE METABOLIC PANEL - Abnormal; Notable for the following components:      Result Value   Total Protein 6.4 (*)    All other components within normal limits  CBC - Abnormal; Notable for the following components:   HCT 35.8 (*)    All other components within normal limits  LIPASE, BLOOD    EKG None  Radiology No results found.  Procedures Procedures    Medications Ordered in ED Medications - No data to display  ED Course/ Medical Decision Making/ A&P                           Medical Decision Making Amount and/or Complexity of Data Reviewed Labs: ordered.   Pt with multiple medical problems and comorbidities and presenting today with a complaint that caries a high risk for morbidity and mortality.  Here today because her infectious disease doctor called her reporting that her labs were abnormal and given she is getting IV antibiotics have been having  diarrhea they were concern for her renal function and that she could be dehydrated.  Patient is well-appearing on exam with normal vital signs.  She has no abdominal pain at this time.  I independently interpreted patient's labs today and her CBC, CMP, lipase are both within normal limits.  Patient is eating and drinking normally.  Do not feel that she needs any IV fluids today.  Most likely erroneous lab from ID office  at 2.28.  Creatinine today was 0.86 which is similar to her prior values.  Findings were results were discussed with the patient.  A note was sent to her specialist office.  At this time feel that patient is stable for discharge home.         Final Clinical Impression(s) / ED Diagnoses Final diagnoses:  Abnormal laboratory test result    Rx / DC Orders ED Discharge Orders     None         Blanchie Dessert, MD 11/24/21 1558

## 2021-11-24 NOTE — Discharge Instructions (Addendum)
The Cr (creatinine) here today was .86.  I was able to get a hold of the PA that you been seeing.  She has seen your results and they are going to speak with home health about your further antibiotics.

## 2021-11-24 NOTE — ED Notes (Signed)
Recheck labs 

## 2021-11-24 NOTE — Telephone Encounter (Signed)
Received results regarding patient's OPAT labs. She is currently taking IV amikacin three times weekly, IV cefoxitin continuous infusion, azithromycin PO, and clofazimine PO. Here are her recent labs:  7/17 SCr 0.85 7/19 amikacin trough <0.8 7/21 amikacin trough 6.8 ** 7/24 SCr 2.28 **; amikacin trough <0.8  Called patient to discuss. She is having diarrhea each time she uses the bathroom for the last several days. She states she is drinking lots of water and doesn't feel dehydrated. She is also taking Aleve D every evening and started Celebrex yesterday from her ortho doctor. The amikacin trough from 7/21 was drawn immediately before her infusion so likely a true trough. I asked her to stop taking the Aleve and she will do so.   Discussed with Dr. Gale Journey and patient will go to the ED to be evaluated. She will likely need fluids and a BMP recheck. Called and spoke to Amy at [facility] and gave verbal order to stop IV amikacin. Amy verbalized understanding and repeated orders back to me. We will look into Nuzyra therapy for her. It will need a prior authorization so will proceed with that. She will take daily Zyvox therapy in the meantime while we try to get [name] approved. Long term Zyvox use is risky due to thrombocytopenia. Sent Rx to [facility] in [place]. It should be ~$10 copay. Answered all of [name]'s questions and told her to reach out if she needs anything.   Kawena Lyday L. Shawniece Oyola, PharmD, BCIDP, AAHIVP, CPP Clinical Pharmacist Practitioner Infectious Diseases [place] for Infectious [facility] 11/24/2021, 11:33 AM

## 2021-11-25 ENCOUNTER — Telehealth: Payer: Self-pay | Admitting: Pharmacist

## 2021-11-25 NOTE — Telephone Encounter (Signed)
Patient went to ED yesterday for repeat labs since kidney function was elevated at 2.28 on Monday. Labs at ED were normal with SCr at 0.86. [name] called me this morning to alert me that she did give herself the amikacin dose last night when she got home from the ED. She also had repeat labs drawn as well. The home health nurse told her that it could be an error because their labs are on the [employer] truck for hours and then sit on a shelf until processed.   Discussed with Dr. Gale Journey and he is ok with continuing IV amikacin for now and watching kidney function closely. I discussed this with [name] and advised her to let me know if her urination routine changes or she states to feel weak/poorly. She is still having diarrhea each time she uses the restroom and describes it as "water" from both ends. She is drinking lots of water to prevent dehydration. She states that she feels if she has diarrhea then there is something that needs to come out and she does not wish to take imodium at this time. Advised her to make sure to inform us if she has abdominal cramping or it gets worse. Will watch her kidney function closely. She has stopped her Aleve and Celebrex.   Called and gave verbal to Santee at [facility] to continue IV amikacin. He verbalized understanding.  Keghan Mcfarren L. Eber Hong, PharmD, BCIDP, AAHIVP, CPP Clinical Pharmacist Practitioner [place] for Infectious Disease 11/25/2021, 10:20 AM

## 2021-12-06 ENCOUNTER — Encounter (HOSPITAL_BASED_OUTPATIENT_CLINIC_OR_DEPARTMENT_OTHER): Payer: Self-pay

## 2021-12-06 ENCOUNTER — Emergency Department (HOSPITAL_BASED_OUTPATIENT_CLINIC_OR_DEPARTMENT_OTHER): Payer: Commercial Managed Care - PPO | Admitting: Radiology

## 2021-12-06 ENCOUNTER — Emergency Department (HOSPITAL_BASED_OUTPATIENT_CLINIC_OR_DEPARTMENT_OTHER): Payer: Commercial Managed Care - PPO

## 2021-12-06 ENCOUNTER — Emergency Department (HOSPITAL_BASED_OUTPATIENT_CLINIC_OR_DEPARTMENT_OTHER)
Admission: EM | Admit: 2021-12-06 | Discharge: 2021-12-07 | Disposition: A | Discharge status: Home / Self Care | Payer: Commercial Managed Care - PPO | Attending: Emergency Medicine | Admitting: Emergency Medicine

## 2021-12-06 DIAGNOSIS — Z853 Personal history of malignant neoplasm of breast: Secondary | ICD-10-CM | POA: Insufficient documentation

## 2021-12-06 DIAGNOSIS — R0609 Other forms of dyspnea: Secondary | ICD-10-CM

## 2021-12-06 DIAGNOSIS — R0602 Shortness of breath: Secondary | ICD-10-CM | POA: Diagnosis present

## 2021-12-06 DIAGNOSIS — Z85038 Personal history of other malignant neoplasm of large intestine: Secondary | ICD-10-CM | POA: Diagnosis not present

## 2021-12-06 DIAGNOSIS — J449 Chronic obstructive pulmonary disease, unspecified: Secondary | ICD-10-CM | POA: Diagnosis not present

## 2021-12-06 DIAGNOSIS — R7401 Elevation of levels of liver transaminase levels: Secondary | ICD-10-CM | POA: Insufficient documentation

## 2021-12-06 LAB — CBC WITH DIFFERENTIAL/PLATELET
Abs Immature Granulocytes: 0.03 10*3/uL (ref 0.00–0.07)
Basophils Absolute: 0 10*3/uL (ref 0.0–0.1)
Basophils Relative: 0 %
Eosinophils Absolute: 0.1 10*3/uL (ref 0.0–0.5)
Eosinophils Relative: 1 %
HCT: 37.1 % (ref 36.0–46.0)
Hemoglobin: 12.3 g/dL (ref 12.0–15.0)
Immature Granulocytes: 0 %
Lymphocytes Relative: 15 %
Lymphs Abs: 1.1 10*3/uL (ref 0.7–4.0)
MCH: 29.2 pg (ref 26.0–34.0)
MCHC: 33.2 g/dL (ref 30.0–36.0)
MCV: 88.1 fL (ref 80.0–100.0)
Monocytes Absolute: 0.7 10*3/uL (ref 0.1–1.0)
Monocytes Relative: 9 %
Neutro Abs: 5.2 10*3/uL (ref 1.7–7.7)
Neutrophils Relative %: 75 %
Platelets: 321 10*3/uL (ref 150–400)
RBC: 4.21 MIL/uL (ref 3.87–5.11)
RDW: 13.6 % (ref 11.5–15.5)
WBC: 7.1 10*3/uL (ref 4.0–10.5)
nRBC: 0 % (ref 0.0–0.2)

## 2021-12-06 LAB — COMPREHENSIVE METABOLIC PANEL
ALT: 148 U/L — ABNORMAL HIGH (ref 0–44)
AST: 60 U/L — ABNORMAL HIGH (ref 15–41)
Albumin: 4.2 g/dL (ref 3.5–5.0)
Alkaline Phosphatase: 137 U/L — ABNORMAL HIGH (ref 38–126)
Anion gap: 9 (ref 5–15)
BUN: 19 mg/dL (ref 8–23)
CO2: 24 mmol/L (ref 22–32)
Calcium: 9.2 mg/dL (ref 8.9–10.3)
Chloride: 105 mmol/L (ref 98–111)
Creatinine, Ser: 0.97 mg/dL (ref 0.44–1.00)
GFR, Estimated: >60 mL/min (ref >60)
Glucose, Bld: 112 mg/dL — ABNORMAL HIGH (ref 70–99)
Potassium: 4.4 mmol/L (ref 3.5–5.1)
Sodium: 138 mmol/L (ref 135–145)
Total Bilirubin: 0.3 mg/dL (ref 0.3–1.2)
Total Protein: 7 g/dL (ref 6.5–8.1)

## 2021-12-06 LAB — PROTIME-INR
INR: 1 (ref 0.8–1.2)
Prothrombin Time: 13.3 seconds (ref 11.4–15.2)

## 2021-12-06 LAB — LACTIC ACID, PLASMA: Lactic Acid, Venous: 0.9 mmol/L (ref 0.5–1.9)

## 2021-12-06 MED ORDER — IOHEXOL 350 MG/ML SOLN
75.0000 mL | Freq: Once | INTRAVENOUS | Status: AC | PRN
  Administered 2021-12-07: 48 mL via INTRAVENOUS

## 2021-12-06 NOTE — ED Triage Notes (Addendum)
SHOB started on Saturday and has increasingly became worse. She is on IV antibiotics for lung infection. Cefoxitin & Amikacin Zithromax and Clofazimine PO HH nurse advised her to come to ED for evaluation  NAD in triage  Pt has PICC line

## 2021-12-07 ENCOUNTER — Encounter (HOSPITAL_BASED_OUTPATIENT_CLINIC_OR_DEPARTMENT_OTHER): Payer: Self-pay | Admitting: Radiology

## 2021-12-07 ENCOUNTER — Encounter: Payer: Self-pay | Admitting: Pulmonary Disease

## 2021-12-07 ENCOUNTER — Emergency Department (HOSPITAL_BASED_OUTPATIENT_CLINIC_OR_DEPARTMENT_OTHER): Payer: Commercial Managed Care - PPO

## 2021-12-07 DIAGNOSIS — R0602 Shortness of breath: Secondary | ICD-10-CM | POA: Diagnosis not present

## 2021-12-07 LAB — TROPONIN I (HIGH SENSITIVITY): Troponin I (High Sensitivity): 2 ng/L (ref <18)

## 2021-12-07 LAB — BRAIN NATRIURETIC PEPTIDE: B Natriuretic Peptide: 47.2 pg/mL (ref 0.0–100.0)

## 2021-12-07 NOTE — Discharge Instructions (Signed)
You were evaluated in the Emergency Department and after careful evaluation, we did not find any emergent condition requiring admission or further testing in the hospital.  Your exam/testing today is overall reassuring.  Recommend continued follow-up with pulmonology to discuss her symptoms.  Please return to the Emergency Department if you experience any worsening of your condition.   Thank you for allowing [place] to be a part of your care.

## 2021-12-07 NOTE — ED Provider Notes (Signed)
[facility] Emergency Department Provider Note MRN:  [id number]  Arrival date & time: 12/07/21     Chief Complaint   Shortness of Breath   History of Present Illness   Sonia Sandoval is a 71 y.o. year-old female with a history of breast cancer, COPD presenting to the ED with chief complaint of shortness of breath.  Patient explains that she has been following with pulmonology for the past month or so.  She was found to have a bacterial infection in the lungs and she is receiving IV antibiotics at home via PICC line for the past month.  Having worsening shortness of breath over the past 2 days, very short of breath when going up the stairs yesterday, short of breath this evening as well.  No chest pain, no leg pain or swelling, no abdominal pain, no fever, no significant cough.  Review of Systems  A thorough review of systems was obtained and all systems are negative except as noted in the HPI and PMH.   Patient's Health History    Past Medical History:  Diagnosis Date   Allergies 1995   Per patient report 11/14/18   Anemia    prior to finding colon cancer, no longer any issues with this per patient   Breast cancer [facility])    Breast cancer, right breast ([place]) 09/2017   S/P mastectomy 12/05/2017   Colon cancer ([place])    COPD (chronic obstructive pulmonary disease) ([place])    pt reports previous MD diagnosed her with this, but takes no medications   Dyspnea    occasionally with exertion   Family history of breast cancer    Headache 1968   per patient report, started as a teenager   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   Osteopenia 03/2018   Seen on DEXA Scan   PONV (postoperative nausea and vomiting)     Past Surgical History:  Procedure Laterality Date   BACK SURGERY  2003   Patient will occassional back pain since 2003   BIOPSY  09/23/2019    Procedure: BIOPSY;  Surgeon: Otis Brace, MD;  Location: [facility];  Service: Gastroenterology;;  EGD and COLON   BREAST BIOPSY Right 09/2017   BRONCHIAL BIOPSY  08/31/2021   Procedure: BRONCHIAL BIOPSIES;  Surgeon: Garner Nash, DO;  Location: [place];  Service: Pulmonary;;   BRONCHIAL BRUSHINGS  08/31/2021   Procedure: BRONCHIAL BRUSHINGS;  Surgeon: Garner Nash, DO;  Location: [place];  Service: Pulmonary;;   BRONCHIAL NEEDLE ASPIRATION BIOPSY  08/31/2021   Procedure: BRONCHIAL NEEDLE ASPIRATION BIOPSIES;  Surgeon: Garner Nash, DO;  Location: [place];  Service: Pulmonary;;   BRONCHIAL WASHINGS  08/31/2021   Procedure: BRONCHIAL WASHINGS;  Surgeon: Garner Nash, DO;  Location: [place];  Service: Pulmonary;;   CESAREAN SECTION  1985; 1992   COLONOSCOPY WITH PROPOFOL N/A 09/23/2019   Procedure: COLONOSCOPY WITH PROPOFOL and EGD;  Surgeon: Otis Brace, MD;  Location: [facility];  Service: Gastroenterology;  Laterality: N/A;   DILATION AND CURETTAGE OF UTERUS     ESOPHAGOGASTRODUODENOSCOPY N/A 09/23/2019   Procedure: ESOPHAGOGASTRODUODENOSCOPY (EGD);  Surgeon: Otis Brace, MD;  Location[facility];  Service: Gastroenterology;  Laterality: N/A;   FLEXIBLE SIGMOIDOSCOPY N/A 11/22/2019   Procedure: FLEXIBLE SIGMOIDOSCOPY;  Surgeon: [name], MD;  Location: [facility];  Service: General;  Laterality: N/A;  LUMBAR DISC SURGERY  2003   MASTECTOMY Left 12/05/2017   PROPHYLACTIC MASTECTOMY   MASTECTOMY COMPLETE / SIMPLE W/ SENTINEL NODE BIOPSY Right 12/05/2017   WITH RADIOACTIVE SEED TARGETED RIGHT AXIILARY LYMPH NODE EXCISION AND RIGHT SENTINEL LYMPH NODE BIOPSY,    MASTECTOMY WITH RADIOACTIVE SEED GUIDED EXCISION AND AXILLARY SENTINEL LYMPH NODE BIOPSY Bilateral 12/05/2017   Procedure: RIGHT SIMPLE MASTECTOMY WITH RADIOACTIVE SEED TARGETED RIGHT AXIILARY LYMPH NODE EXCISION AND RIGHT SENTINEL LYMPH NODE BIOPSY, LEFT PROPHYLACTIC MASTECTOMY;   Surgeon: [name], MD;  Location: [place];  Service: General;  Laterality: Bilateral;   SUBMUCOSAL TATTOO INJECTION  09/23/2019   Procedure: SUBMUCOSAL TATTOO INJECTION;  Surgeon: Otis Brace, MD;  Location: [facility];  Service: Gastroenterology;;   VIDEO BRONCHOSCOPY WITH RADIAL ENDOBRONCHIAL ULTRASOUND  08/31/2021   Procedure: RADIAL ENDOBRONCHIAL ULTRASOUND;  Surgeon: Garner Nash, DO;  Location: [facility];  Service: Pulmonary;;    Family History  Problem Relation Age of Onset   Dementia Mother    Lung cancer Father        smoked   Emphysema Father    Heart disease Maternal Grandfather    Cancer Maternal Aunt 28       breast cancer    Cancer Cousin        maternal first cousin with breast cancer in her 8s    Social History   Socioeconomic History   Marital status: Married    Spouse name: Not on file   Number of children: 4   Years of education: Not on file   Highest education level: Not on file  Occupational History   Not on file  Tobacco Use   Smoking status: Never   Smokeless tobacco: Never  Vaping Use   Vaping Use: Never used  Substance and Sexual Activity   Alcohol use: Yes    Alcohol/week: 14.0 standard drinks of alcohol    Types: 14 Glasses of wine per week    Comment: 2-3 3oz glasses of wine per day   Drug use: Never   Sexual activity: Not on file  Other Topics Concern   Not on file  Social History Narrative   12-19-17 Unable to ask abuse questions husband with her today.   Social Determinants of Health   Financial Resource Strain: Not on file  Food Insecurity: Not on file  Transportation Needs: No Transportation Needs (05/03/2018)   PRAPARE - Hydrologist (Medical): No    Lack of Transportation (Non-Medical): No  Physical Activity: Not on file  Stress: Not on file  Social Connections: Not on file  Intimate Partner Violence: Not on file     Physical Exam   Vitals:   12/07/21 0015 12/07/21 0100  BP:  (!) 149/84 (!) 150/86  Pulse: 78 75  Resp: 18 17  Temp:    SpO2: 100% 100%    CONSTITUTIONAL: Well-appearing, NAD NEURO/PSYCH:  Alert and oriented x 3, no focal deficits EYES:  eyes equal and reactive ENT/NECK:  no LAD, no JVD CARDIO: Regular rate, well-perfused, normal S1 and S2 PULM:  CTAB no wheezing or rhonchi, diminished breath sounds right lower lobe GI/GU:  non-distended, non-tender MSK/SPINE:  No gross deformities, no edema SKIN:  no rash, atraumatic   *Additional and/or pertinent findings included in MDM below  Diagnostic and Interventional Summary    EKG Interpretation  Date/Time:  Monday December 06 2021 19:42:55 EDT Ventricular Rate:  75 PR Interval:  130 QRS Duration: 72 QT Interval:  382  QTC Calculation: 426 R Axis:   75 Text Interpretation: Unusual P axis, possible ectopic atrial rhythm Abnormal ECG When compared with ECG of 20-Sep-2019 17:17, PREVIOUS ECG IS PRESENT Confirmed by Gerlene Fee [phone]) on 12/06/2021 11:48:15 PM       Labs Reviewed  COMPREHENSIVE METABOLIC PANEL - Abnormal; Notable for the following components:      Result Value   Glucose, Bld 112 (*)    AST 60 (*)    ALT 148 (*)    Alkaline Phosphatase 137 (*)    All other components within normal limits  CULTURE, BLOOD (ROUTINE X 2)  CULTURE, BLOOD (ROUTINE X 2)  LACTIC ACID, PLASMA  CBC WITH DIFFERENTIAL/PLATELET  PROTIME-INR  BRAIN NATRIURETIC PEPTIDE  LACTIC ACID, PLASMA  URINALYSIS, ROUTINE W REFLEX MICROSCOPIC  TROPONIN I (HIGH SENSITIVITY)  TROPONIN I (HIGH SENSITIVITY)    CT Angio Chest Pulmonary Embolism (PE) W or WO Contrast  Final Result    DG Chest 2 View  Final Result      Medications  iohexol (OMNIPAQUE) 350 MG/ML injection 75 mL (48 mLs Intravenous Contrast Given 12/07/21 0000)     Procedures  /  Critical Care Procedures  ED Course and Medical Decision Making  Initial Impression and Ddx Differential diagnosis includes worsening of bacterial pulmonary  infection, PE, pleural effusion, pericardial effusion.  Very well-appearing on exam with no increased work of breathing, vital signs normal, chest x-ray does not seem to have enough diagnostic certainty at this point, will obtain CTA.  Past medical/surgical history that increases complexity of ED encounter: Breast cancer, ongoing lung infection  Interpretation of Diagnostics I personally reviewed the EKG and my interpretation is as follows: Sinus rhythm, no concerning ischemic features  Labs reassuring with no significant blood count or electrolyte disturbance, troponin negative.  Minimally elevated LFTs of unclear significance.  CTA is without evidence of PE and the cavitary lesion appears to be improving.  Patient Reassessment and Ultimate Disposition/Management     Patient continues to look and feel well, she ambulated without issue.  Appropriate for discharge.  Patient management required discussion with the following services or consulting groups:  None  Complexity of Problems Addressed Acute illness or injury that poses threat of life of bodily function  Additional Data Reviewed and Analyzed Further history obtained from: Further history from spouse/family member  Additional Factors Impacting ED Encounter Risk Consideration of hospitalization  Barth Kirks. [place]onia Small, [place] [email or web address]  Final Clinical Impressions(s) / ED Diagnoses     ICD-10-CM   1. DOE (dyspnea on exertion)  R06.09       ED Discharge Orders     None        Discharge Instructions Discussed with and Provided to Patient:     Discharge Instructions      You were evaluated in the Emergency Department and after careful evaluation, we did not find any emergent condition requiring admission or further testing in the hospital.  Your exam/testing today is overall reassuring.  Recommend continued follow-up with pulmonology to discuss her  symptoms.  Please return to the Emergency Department if you experience any worsening of your condition.   Thank you for allowing [place] to be a part of your care.       Maudie Flakes, MD 12/07/21 [phone]

## 2021-12-07 NOTE — Telephone Encounter (Signed)
Dr. Valeta Harms, please advise on pt's email. She is questioning if she still needs to have her CT chest WO contrast on 8/18 ordered by Dr. Valeta Harms. Pt went to ED on 8/7 and had a CTA. Thanks.

## 2021-12-08 NOTE — Telephone Encounter (Signed)
FYI

## 2021-12-12 LAB — CULTURE, BLOOD (ROUTINE X 2)
Culture: NO GROWTH
Culture: NO GROWTH

## 2021-12-17 ENCOUNTER — Other Ambulatory Visit (HOSPITAL_COMMUNITY): Payer: Commercial Managed Care - PPO

## 2021-12-24 ENCOUNTER — Telehealth: Payer: Self-pay

## 2021-12-24 NOTE — Telephone Encounter (Signed)
I spoke with her a few days ago and told her to do Benadryl every 4 hours, calamine lotion, and/or hydrocortisone cream as well. Also told her to add a OTC antihistamine like Zytrec or Claritin. She said she hadn't started anything new over the weekend except she got heparin to flush her PICC on Saturday and Sunday. Will leave it up to Dr. Gale Journey to see if he wants to send in a medication or not. I also advised her to go to ED or urgent care if it did not resolve.

## 2021-12-24 NOTE — Telephone Encounter (Signed)
Thank you Cassie! 

## 2021-12-24 NOTE — Telephone Encounter (Signed)
Sent patient a message via my chart.  

## 2021-12-24 NOTE — Telephone Encounter (Signed)
Jeani Hawking with [facility] called stating the patient reached out to them complaining of a rash that started Sunday all over her body. Patient began taking Benadryl on Tuesday for itching with little relief. I reached out to the patient to offer her an appointment to see a provider today to be evaluated. Patient stated she has family members coming in to town today and can not come in today. Patient denies any SOB or tightness of the throat. I advised the patient to go to the ED if symptoms worsen, SOB or throat tightness.  She has a follow up with Dr. Gale Journey on 12/28/21 and would like to know if something could be sent to the pharmacy for the rash. Please advise

## 2021-12-28 ENCOUNTER — Ambulatory Visit (INDEPENDENT_AMBULATORY_CARE_PROVIDER_SITE_OTHER): Payer: Commercial Managed Care - PPO | Admitting: Internal Medicine

## 2021-12-28 ENCOUNTER — Other Ambulatory Visit: Payer: Self-pay

## 2021-12-28 ENCOUNTER — Telehealth: Payer: Self-pay

## 2021-12-28 ENCOUNTER — Other Ambulatory Visit (HOSPITAL_COMMUNITY): Payer: Self-pay

## 2021-12-28 ENCOUNTER — Encounter: Payer: Self-pay | Admitting: Internal Medicine

## 2021-12-28 VITALS — BP 131/81 | HR 88 | Temp 97.9°F | Ht 63.0 in | Wt 146.0 lb

## 2021-12-28 DIAGNOSIS — R21 Rash and other nonspecific skin eruption: Secondary | ICD-10-CM | POA: Diagnosis not present

## 2021-12-28 DIAGNOSIS — J189 Pneumonia, unspecified organism: Secondary | ICD-10-CM

## 2021-12-28 DIAGNOSIS — A318 Other mycobacterial infections: Secondary | ICD-10-CM

## 2021-12-28 DIAGNOSIS — Z889 Allergy status to unspecified drugs, medicaments and biological substances status: Secondary | ICD-10-CM | POA: Diagnosis not present

## 2021-12-28 DIAGNOSIS — J984 Other disorders of lung: Secondary | ICD-10-CM

## 2021-12-28 NOTE — Patient Instructions (Signed)
Good thing: your ct shows some improvement  Now we need to know if your sputum is reflecting that (three sputum is expected within the next 2-4 weeks -- early morning sputum without gargling or brushing or drinking/eating -- 5-7 days apart)   With regard to your rash, it appears to be progressive and I am not sure your body can handle this for the next several weeks.  We'll need to look into a couple of other medications: Tigecycline vs omadocycline  I think the culprit is cefoxetin for the rash   Continue current medication now until our pharmacy team has done some research on how to get you one of the above 2 medications....   See me in 4 weeks

## 2021-12-28 NOTE — Telephone Encounter (Signed)
[facility] Patient Advocate Encounter   Received notification from [employer] that prior authorization for Elesa Hacker is required.   PA submitted on 12/28/21 Key [id number] Status is pending    [facility] will continue to follow.   Ileene Patrick, [facility] Patient [facility] Phone: [phone] Fax:  [phone]

## 2021-12-28 NOTE — Progress Notes (Signed)
[facility] for Infectious Disease  Reason for Consult:cavitary lung disease/infection Referring Provider: June Leap    Patient Active Problem List   Diagnosis Date Noted   Cavitary lesion of lung 08/08/2021   S/P laparoscopic-assisted sigmoidectomy 11/22/2019   Genetic testing 10/30/2019   Family history of breast cancer    Cancer of sigmoid colon ([place]) 09/26/2019   Iron deficiency anemia due to chronic blood loss 09/26/2019   Symptomatic anemia 09/21/2019   GI bleed 09/20/2019   Malignant neoplasm of lower-outer quadrant of right breast of female, estrogen receptor positive ([place]) 10/27/2017    Cc -- reason for consult = ntm cavitary lung disease  HPI: Sonia Sandoval is a 71 y.o. 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 Was supposed to be seen several months prior but didn't show.    5/02 underwent bronch: "The distinct navigation pathways prepared prior to this procedure were then utilized to navigate to patient's lesion identified on CT scan. The robotic catheter was secured into place and the vision probe was withdrawn.  Lesion location was approximated using fluoroscopy and radial endobronchial ultrasound for peripheral targeting. Under fluoroscopic guidance transbronchial needle brushings, transbronchial needle biopsies, and transbronchial forceps biopsies were performed to be sent for cytology and pathology. A bronchioalveolar lavage was performed in the right upper lobe and sent for microbiology.  Additional transbronchial biopsies  sent for tissue culture.   At the end of the procedure a general airway inspection was performed and there was no evidence of active bleeding. The bronchoscope was removed.  The patient tolerated the procedure well. There was no significant blood loss and there were no obvious complications. A post-procedural chest x-ray is pending."  Cytology didn't see cancer cells  On micro -- something is growing that looks like ntm and the [place] technology calls it abscessus but it was sent on 5/8th to reference lab to identify   Patient without respiratory sx outside of stable mild dyspnea on exertion 4-5 months. She would encounter this walking up a hill No b sx Has watery eyes and a recent dry cough but query if it's allergy No reflux No decreased appetite No weight loss   11/04/21 id clinic f/u Patient is back for f/u post susceptibility testing which show azithromycin/amikacin sensitive m abscessus She continues to feel well without b sx, cough, dyspnea, chest pain She is accompanied by her significant others today  12/28/21 id clinic f/u Patient developed itchy rash about 10 days ago involving trunk/ext. Takes benadryl and topical hydrocortisone. 2 benadryls to help sleep. Also takes claritin. Rash is getting worse Opat labs elevated lft but improving (moderate in 100s), without eosinophilia Amikacin trough 8/24 is 0.8 with creatinine 0.98 No fever chill Occasional cough Went to ed 8/08 for sob after extreme exertion and chest ct was done -- no pe. Improved cavitation.  No joint pain/muscle aching No new vision change; she endorses having cataracts Has chronic leg cramps which she has been using tonic water for 6 months. Question of some tingling in hands/feet very mild  At some point  she has recurrent elevation ak trough and cr and we had planned to substitute amikacin with linezolid but she done well as above with AK....  Review of Systems: ROS All other ros negative      Past  Medical History:  Diagnosis Date   Allergies 1995   Per patient report 11/14/18   Anemia    prior to finding colon cancer, no longer any issues with this per patient   Breast cancer [facility])    Breast cancer, right breast ([place]) 09/2017   S/P mastectomy 12/05/2017   Colon cancer ([place])    COPD (chronic obstructive pulmonary disease) ([place])    pt reports previous MD diagnosed her with this, but takes no medications   Dyspnea    occasionally with exertion   Family history of breast cancer    Headache 1968   per patient report, started as a teenager   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   Osteopenia 03/2018   Seen on DEXA Scan   PONV (postoperative nausea and vomiting)     Social History   Tobacco Use   Smoking status: Never   Smokeless tobacco: Never  Vaping Use   Vaping Use: Never used  Substance Use Topics   Alcohol use: Yes    Alcohol/week: 14.0 standard drinks of alcohol    Types: 14 Glasses of wine per week    Comment: 2-3 3oz glasses of wine per day   Drug use: Never    Family History  Problem Relation Age of Onset   Dementia Mother    Lung cancer Father        smoked   Emphysema Father    Heart disease Maternal Grandfather    Cancer Maternal Aunt 60       breast cancer    Cancer Cousin        maternal first cousin with breast cancer in her 68s    Allergies  Allergen Reactions   Codeine Nausea And Vomiting and Other (See Comments)    headache    OBJECTIVE: Vitals:   12/28/21 1113  BP: 131/81  Pulse: 88  Temp: 97.9 F (36.6 C)  TempSrc: Oral  SpO2: 100%  Weight: 146 lb (66.2 kg)  Height: _0  (1.6 m)   There is no height or weight on file to calculate BMI.   Physical Exam General/constitutional: no distress, pleasant HEENT: Normocephalic, PER, Conj Clear, EOMI, Oropharynx clear Neck supple CV: rrr no mrg Lungs: clear to  auscultation, normal respiratory effort Abd: Soft, Nontender Ext: no edema Skin: squamous rash on trunk >>> extremities. Neuro: nonfocal MSK: no peripheral joint swelling/tenderness/warmth; back spines nontender      Lab: Lab Results  Component Value Date   WBC 7.1 12/06/2021   HGB 12.3 12/06/2021   HCT 37.1 12/06/2021   MCV 88.1 12/06/2021   PLT 321 27/78/2423   Last metabolic panel Lab Results  Component Value Date   GLUCOSE 112 (H) 12/06/2021   NA 138 12/06/2021   K 4.4 12/06/2021   CL 105 12/06/2021   CO2 24 12/06/2021   BUN 19 12/06/2021   CREATININE 0.97 12/06/2021   GFRNONAA >60 12/06/2021   CALCIUM 9.2 12/06/2021   PHOS 3.6 11/14/2018   PROT 7.0 12/06/2021   ALBUMIN 4.2 12/06/2021   BILITOT 0.3 12/06/2021   ALKPHOS 137 (H) 12/06/2021   AST 60 (H) 12/06/2021  ALT 148 (H) 12/06/2021   ANIONGAP 9 12/06/2021    Microbiology:  Serology:  Imaging: I have reviewed the imaging personally and incorporated into decision making  08/06/21 chest abd pelv ct with contrast FINDINGS: CT CHEST FINDINGS   Cardiovascular: Aortic atherosclerosis. Normal heart size. No pericardial effusion.   Mediastinum/Nodes: No enlarged mediastinal, hilar, or axillary lymph nodes. Thyroid gland, trachea, and esophagus demonstrate no significant findings.   Lungs/Pleura: Interval increase in wall thickening and cavitation within the right pulmonary apex, overall lesion now confluent and measuring 3.8 x 3.1 cm (series 4, image 24). Increased clustered centrilobular nodularity and small consolidations in this vicinity (series 4, image 37). Residual bandlike scarring in the bilateral lung bases (series 4, image 109, 122). Occasional small nodules of the left lower lobe measuring up to 0.5 cm (series 4, image 21). No pleural effusion or pneumothorax.   Musculoskeletal: No chest wall mass or suspicious osseous lesions identified. Status post bilateral mastectomy.   CT ABDOMEN  PELVIS FINDINGS   Hepatobiliary: No solid liver abnormality is seen. No gallstones, gallbladder wall thickening, or biliary dilatation.   Pancreas: Unremarkable. No pancreatic ductal dilatation or surrounding inflammatory changes.   Spleen: Normal in size without significant abnormality.   Adrenals/Urinary Tract: Adrenal glands are unremarkable. Punctuate nonobstructive calculus in the inferior pole of the right kidney (series 5, image 89). No left-sided calculi, ureteral calculi, or hydronephrosis. Bladder is unremarkable.   Stomach/Bowel: Stomach is within normal limits. Appendix is not clearly visualized and may be surgically absent. The cecum is anterior to the liver. Status post sigmoid colon resection and reanastomosis. No evidence of bowel wall thickening, distention, or inflammatory changes.   Vascular/Lymphatic: Aortic atherosclerosis. No enlarged abdominal or pelvic lymph nodes.   Reproductive: No mass or other abnormality.   Other: No abdominal wall hernia or abnormality. No ascites.   Musculoskeletal: No acute osseous findings.   IMPRESSION: 1. Status post sigmoid colon resection and reanastomosis. No evidence of lymphadenopathy or metastatic disease in the chest, abdomen, or pelvis. 2. Interval increase in wall thickening and cavitation within the right pulmonary apex, overall lesion now confluent and measuring 3.8 x 3.1 cm. Increased clustered centrilobular nodularity and small consolidations in this vicinity. Findings are consistent with ongoing, worsened cavitary infection. 3. Additional small nodules of the left lower lobe measuring up to 0.5 cm, unchanged and almost certainly benign, incidental sequelae of infection inflammation. 4. Nonobstructive right nephrolithiasis.   12/07/21  chest cta 1. No pulmonary embolism. No acute intrathoracic pathology identified. 2. Slight interval decrease in size of a cavitary lesion within the right upper lobe in  keeping with at least partial response to therapy. Repeat imaging in 6-12 months would be helpful in confirming stability or resolution exclude the possibility of an underlying cavitating malignancy 3. Stable 5 mm subpleural pulmonary nodule within the left lower lobe, safely considered benign.   Assessment/plan: Problem List Items Addressed This Visit   None Visit Diagnoses     Mycobacterium abscessus infection    -  Primary   Cavitary pneumonia           Abx: 7/6-c amikacin 7/6-c cefoxitin 7/6-c azith 7/6-c clofazimine    Discussed natural history, epidimiology, pathogenesis, diagnosis criteria and treatment indication for ntm lungs  Discussed resistant nature of abscessus, cure rate, relapse rate  At this time she is completely assymptomatic but imaging has indicated progression of cavitary lesion. On cytology there was no malignant cells. She had hx of colon cancer and breast cancer  at least 3 years prior to this admission, and all are in remission  At this time culture is still pending final ID confirmation and susceptibility testing  I would treat her based on progression of ct scan  Will await susceptibility and follow up with her in around 4-6 weeks to decide on treatment regimen    7/6 assessment Doing well Due to cavitary progression will do 2 iv's and 2 po's  Initial regimen will be iv amikacin, cefoxitin and po clofazimine and azithromycin  The initial regimen will be at least 1-3 months as long as she can tolerate.  After the 3 months potentially we can keep 1 iv and the 2 PO's or transition her iv's to inhaled amikacin with the 2 po's  Our pharmacist team speaks with her today. Will start [place] and azith today  Once picc is in she can start the cefoxitin continuous daily infusion and tiw amikacin   Repeat chest ct in 6-8 weeks Repeat afb sputum x3 1 month after starting medication   8/29 assessment Ct improved on 8/8 Rash ?cefoxitin. No sign  of DRESS or arthus reaction otherwise. Will look in to the newer tetracycline along with bedaquilline for her. Can continue as is for now her current drug regimen 3 sputum early morning ordered for afb testing -- discussed to do it before eating/drinking/brushing/gargling Repeat chest ct in 3-6 months   Discussed with our id pharmacy team     Follow up with me in 4-6 weeks  I spent more than 40 minute reviewing data/chart, and coordinating care and >50% direct face to face time providing counseling/discussing diagnostics/treatment plan with patient      Follow-up: Return in about 4 weeks (around 01/25/2022).  Sonia Sandoval, [place] [facility] 12/28/2021, 11:11 AM

## 2021-12-29 ENCOUNTER — Ambulatory Visit (INDEPENDENT_AMBULATORY_CARE_PROVIDER_SITE_OTHER): Payer: Commercial Managed Care - PPO | Admitting: Pulmonary Disease

## 2021-12-29 ENCOUNTER — Encounter: Payer: Self-pay | Admitting: Pulmonary Disease

## 2021-12-29 VITALS — BP 122/70 | HR 61 | Ht 63.0 in | Wt 145.8 lb

## 2021-12-29 DIAGNOSIS — R0600 Dyspnea, unspecified: Secondary | ICD-10-CM

## 2021-12-29 DIAGNOSIS — J984 Other disorders of lung: Secondary | ICD-10-CM

## 2021-12-29 DIAGNOSIS — A310 Pulmonary mycobacterial infection: Secondary | ICD-10-CM

## 2021-12-29 DIAGNOSIS — Z853 Personal history of malignant neoplasm of breast: Secondary | ICD-10-CM

## 2021-12-29 DIAGNOSIS — J449 Chronic obstructive pulmonary disease, unspecified: Secondary | ICD-10-CM | POA: Diagnosis not present

## 2021-12-29 DIAGNOSIS — Z85038 Personal history of other malignant neoplasm of large intestine: Secondary | ICD-10-CM

## 2021-12-29 LAB — PULMONARY FUNCTION TEST
DL/VA % pred: 118 %
DL/VA: 4.96 ml/min/mmHg/L
DLCO cor % pred: 130 %
DLCO cor: 24.58 ml/min/mmHg
DLCO unc % pred: 125 %
DLCO unc: 23.71 ml/min/mmHg
FEF 25-75 Post: 2.35 L/sec
FEF 25-75 Pre: 0.9 L/sec
FEF2575-%Change-Post: 159 %
FEF2575-%Pred-Post: 124 %
FEF2575-%Pred-Pre: 47 %
FEV1-%Change-Post: 68 %
FEV1-%Pred-Post: 113 %
FEV1-%Pred-Pre: 67 %
FEV1-Post: 2.5 L
FEV1-Pre: 1.48 L
FEV1FVC-%Change-Post: 56 %
FEV1FVC-%Pred-Pre: 63 %
FEV6-%Change-Post: 9 %
FEV6-%Pred-Post: 117 %
FEV6-%Pred-Pre: 108 %
FEV6-Post: 3.28 L
FEV6-Pre: 3.01 L
FEV6FVC-%Change-Post: 0 %
FEV6FVC-%Pred-Post: 104 %
FEV6FVC-%Pred-Pre: 104 %
FVC-%Change-Post: 7 %
FVC-%Pred-Post: 113 %
FVC-%Pred-Pre: 105 %
FVC-Post: 3.3 L
FVC-Pre: 3.07 L
Post FEV1/FVC ratio: 76 %
Post FEV6/FVC ratio: 100 %
Pre FEV1/FVC ratio: 48 %
Pre FEV6/FVC Ratio: 99 %
RV % pred: 141 %
RV: 3 L
TLC % pred: 126 %
TLC: 6.18 L

## 2021-12-29 MED ORDER — ALBUTEROL SULFATE HFA 108 (90 BASE) MCG/ACT IN AERS: 2.0000 | INHALATION_SPRAY | Freq: Four times a day (QID) | RESPIRATORY_TRACT | 2 refills | Status: DC | PRN

## 2021-12-29 NOTE — Patient Instructions (Addendum)
Thank you for visiting Dr. Valeta Harms at [facility]. Today we recommend the following:  Meds ordered this encounter  Medications   albuterol (VENTOLIN HFA) 108 (90 Base) MCG/ACT inhaler    Sig: Inhale 2 puffs into the lungs every 6 (six) hours as needed for wheezing or shortness of breath.    Dispense:  8 g    Refill:  2   Return in about 1 year (around 12/30/2022) for with APP or Dr. Valeta Harms.    Please do your part to reduce the spread of COVID-19.

## 2021-12-29 NOTE — Patient Instructions (Signed)
Full PFT performed today. °

## 2021-12-29 NOTE — Progress Notes (Signed)
Synopsis: Referred in April 2023 for lung nodule by [name], FNP  Subjective:   PATIENT ID: Sonia Sandoval GENDER: female DOB: Dec 26, 1950, MRN: [id number]  Chief Complaint  Patient presents with   Follow-up    Pt is here for follow up for DOE. Pt had full pfts done today. 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  Presents for evaluation of a right upper lobe cavitary lesion.  She denies cough sputum production.  No incarceration, no travel outside of the country.  No known exposures to TB.  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  She is here today for follow-up of this lung nodule.  She has a CT scan scheduled for Friday.  She was going to see me last fall but there was plans for changing of her insurance and so she pushed off the appointment until the following year.  And so she is here today to talk about this she does have a follow-up appointment with medical oncology next week.  OV 12/29/2021: Here today for follow-up.Last seen in the office by Barbaraann Barthel, NP.  Seen by Dr. Gale Journey from infectious disease.  Office note reviewed from yesterday.  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  Follow-up CT imaging was canceled because she had a CT completed in the emergency room on 12/07/2021.  This follow-up CT revealed slight interval decrease in the size of the cavity within the right upper lobe.  Doing well today.  Unfortunately had a recent drug reaction with skin manifestations.  Was stopped yesterday by one of her medicines.  She has follow-up with Dr. Gale Journey relating this.  She had PFTs completed prior to today's office visit which we reviewed today.  She does have a ratio of 63 with an  FEV1 of 67% predicted prebronchodilator and 68% change with postbronchodilator, TLC of 126%, RV of 141%, DLCO 125%.  She has history of secondhand smoke exposure from her parents and a history of allergies as a kid.  From a respiratory standpoint she occasionally gets short of breath with significant exertion but otherwise no other symptomatology.    Oncology History Overview Note  Cancer Staging Cancer of sigmoid colon [facility]) Staging form: Colon and Rectum, AJCC 8th Edition - Pathologic stage from 11/22/2019: Stage IIA (pT3, pN0, cM0) - Signed by [name], MD on 12/17/2019  Malignant neoplasm of lower-outer quadrant of right breast of female, estrogen receptor positive ([place]) Staging form: Breast, AJCC 8th Edition - Clinical: Stage IIB (cT2, cN1, cM0, G3, ER+, PR+, HER2-) - Unsigned - Pathologic: Stage IIA (pT2, pN1(sn), cM0, G3, ER+, PR+, HER2-) - Unsigned    Malignant neoplasm of lower-outer quadrant of right breast of female, estrogen receptor positive ([place])  10/23/2017 Initial Diagnosis    right breast lower outer quadrant biopsy 10/23/2017 for a clinical T2 N1, stage IIB invasive ductal carcinoma, grade 3, estrogen and progesterone receptor positive, HER-2 not amplified, with an MIB-1 of 20%   11/07/2017 Miscellaneous   Mammaprint on was read as low risk, predicting no significant chemotherapy benefit with a 5-year distant disease-free survival in the 97-98% range with hormone therapy alone   12/05/2017 Surgery   status post  bilateral mastectomies on 12/05/2017 showing             (a) on the left, no evidence of malignancy             (b) on the right, a pT2 pN1, stage IIB invasive ductal carcinoma, grade 3, with negative margins. By Dr Brantley Stage   01/15/2018 - 02/28/2018 Radiation Therapy   adjuvant radiation 01/15/2018 - 02/28/2018 with Dr Lisbeth Renshaw              Site/dose: The patient initially received a dose of 50.4 Gy in 28 fractions to the chest wall and supraclavicular region. This was  delivered using a 3-D conformal, 4 field technique. The patient then received a boost to the mastectomy scar. This delivered an additional 10 Gy in 5 fractions using an en face electron field. The total dose was 60.4 Gy.    04/01/2018 -  Anti-estrogen oral therapy   started anastrozole in 04/2018. Switched to Exemestane in 08/2020 due to joint pain.             (a) bone density 03/07/2018 with a T score of -1.8. Her 04/21/20 DEXA showed osteopenia -1.8 at right total femur, with low risk for fracture.   10/28/2019 Genetic Testing   Negative genetic testing.  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  The Common Hereditary Gene Panel offered by [facility] includes sequencing and/or deletion duplication testing of the following 48 genes: APC, ATM, AXIN2, BARD1, BMPR1A, BRCA1, BRCA2, BRIP1, CDH1, CDK4, CDKN2A (p14ARF), CDKN2A (p16INK4a), CHEK2, CTNNA1, DICER1, EPCAM (Deletion/duplication testing only), GREM1 (promoter region deletion/duplication testing only), KIT, MEN1, MLH1, MSH2, MSH3, MSH6, MUTYH, NBN, NF1, NHTL1, PALB2, PDGFRA, PMS2, POLD1, POLE, PTEN, RAD50, RAD51C, RAD51D, RNF43, SDHB, SDHC, SDHD, SMAD4, SMARCA4. STK11, TP53, TSC1, TSC2, and VHL.  The following genes were evaluated for sequence changes only: SDHA and HOXB13 c.251G>A variant only. The report date is October 28, 2019.   03/08/2021 Imaging   CT Chest w/o contrast  IMPRESSION: 1. The previous FDG avid lung mass within the anterior right lower lobe has decreased in size compared with previous exam compatible with response to therapy. The cavitary lung nodule within the right apex is slightly increased in size from previous exam. Additionally, there is a new irregular nodule within the medial right upper lobe measuring 6 mm. Previously 3 mm. 2. Interval increase in size of cavitary nodule within the right lung apex. This measures 2.3 x 1.5 cm, compared with 1.3 x 0.9 cm previously. Underlying malignancy cannot be  excluded. 3. New small nodule within the right upper lobe measures 6 mm. This has a nonspecific appearance but warrants attention on follow-up imaging. 4. The remaining small pulmonary nodules noted previously are stable in the interval. 5. Coronary artery calcifications noted. 6. Aortic Atherosclerosis (ICD10-I70.0).   Cancer of sigmoid colon ([place])  09/23/2019 Tumor Marker   Baseline CEA 1.4 on 09/23/19    09/23/2019 Procedure   Colonoscopy and Upper endoscopy with Dr Alessandra Bevels 09/23/19  Upper Endoscopy Impression - Z-line regular, 35 cm from the incisors. - Small hiatal hernia. - Non-bleeding gastric ulcer. Biopsied. - Hematin (altered blood/coffee-ground-like material) in the gastric body. - Normal duodenal bulb, first portion of the duodenum and second portion of - Normal duodenal bulb, first portion of the duodenum and second portion of the duodenum.  Colonoscopy Impression - Preparation of the colon was poor. - Perianal skin tags found on perianal exam. - Likely malignant tumor in the recto-sigmoid colon. Biopsied.  Tattooed. - Internal hemorrhoids.   09/23/2019 Initial Biopsy   FINAL MICROSCOPIC DIAGNOSIS: 09/23/19 A. STOMACH, ULCER, BIOPSY:  - Gastric antral mucosa with mild nonspecific reactive gastropathy  - Warthin Starry stain is negative for Helicobacter pylori   B. COLON, RECTOSIGMOID, MASS, BIOPSY:  - Adenocarcinoma, see comment  COMMENT:   B.  Immunohistochemical stains for MMR-related proteins are pending and  will be reported in an addendum. Dr. Saralyn Pilar reviewed the case and  concurs with the diagnosis. Dr. Alessandra Bevels was notified on 09/24/2019.     ADDENDUM:  Mismatch Repair Protein (IHC)  SUMMARY INTERPRETATION: NORMAL  There is preserved expression of the major MMR proteins. There is a very  low probability that microsatellite instability (MSI) is present.  However, certain clinically significant MMR protein mutations may result  in preservation of  nuclear expression. It is recommended that the  preservation of protein expression be correlated with molecular based  MSI testing.   IHC EXPRESSION RESULTS  TEST           RESULT  MLH1:          Preserved nuclear expression  MSH2:          Preserved nuclear expression  MSH6:          Preserved nuclear expression  PMS2:          Preserved nuclear expression    09/23/2019 Imaging   CT CAP W Contrast 09/23/19  IMPRESSION: 1. There is eccentric wall thickening of the rectosigmoid colon, in keeping with mass identified and biopsied by colonoscopy.   2. There are prominent subcentimeter lymph nodes in the sigmoid mesocolon, suspicious for nodal metastatic disease. There is no other lymphadenopathy in the abdomen or pelvis.   3. There are multiple small bilateral pulmonary nodules measuring up to 4 mm, nonspecific although very suspicious for pulmonary metastatic disease. Given history of breast malignancy, previous imaging of the chest would be very helpful to assess for stability. This report may be addended for comparison if prior imaging can be obtained.   4. Status post bilateral mastectomy with irregular subpleural opacity of the anterior right upper lobe, consistent with history of breast malignancy and radiation fibrosis.   5. There is adjacent nodularity of the right pulmonary apex, the largest discrete nodular component measuring up to 1.1 cm. This is more favored infectious or inflammatory although nonspecific and metastatic disease is not excluded. As as above, prior imaging would be very helpful to assess.   6. Small focus of nonspecific infectious or inflammatory airspace disease of the perihilar left upper lobe.   09/26/2019 Initial Diagnosis   Cancer of sigmoid colon ([place])   10/28/2019 PET scan   IMPRESSION: 1. Increased tracer uptake within the pelvis corresponding to wall thickening of the rectosigmoid colon favored to represent primary colorectal carcinoma. No  findings of FDG avid nodal metastasis or solid organ metastasis within the abdomen or pelvis. 2. Within the right lung apex there is a cluster of 3 subpleural nodular densities which exhibit varying degrees of mild FDG uptake with SUV max between 1.9 and 3.1. Morphologically the appearance is suggestive of benign postinflammatory pleuroparenchymal scarring. This would be an atypical pattern of metastatic disease, which is considered less favored, but not excluded. Attention in this area on serial follow-up imaging is advised. 3. Two small, less than 5 mm left lower lobe lung nodules are too small to reliably characterize. 4.  Aortic Atherosclerosis (ICD10-I70.0). 5. Nonobstructing right renal calculus.     11/22/2019  Cancer Staging   Staging form: Colon and Rectum, AJCC 8th Edition - Pathologic stage from 11/22/2019: Stage IIA (pT3, pN0, cM0) - Signed by [name], MD on 12/17/2019   11/22/2019 Surgery   XI ROBOT ASSISTED SIGMOIDECTOMY and FLEXIBLE SIGMOIDOSCOPY by Dr white and Dr Marcello Moores   11/22/2019 Pathology Results   FINAL MICROSCOPIC DIAGNOSIS:   A. SIGMOID COLON, RESECTION:  - Invasive moderately differentiated adenocarcinoma, 4.5 cm, involving distal sigmoid colon  - Carcinoma focally invades into pericolonic soft tissue  - Resection margins are negative for carcinoma  - Negative for lymphovascular or perineural invasion  - Eighteen benign lymph nodes (0/18)  - See oncology table   B. FINAL DISTAL MARGIN, EXCISION:  - Colonic donut, negative for carcinoma    04/15/2020 Imaging   CT Chest  IMPRESSION: 1. New nodular 1.9 cm focus of consolidation in the anterior right lower lobe with surrounding mild patchy tree-in-bud opacity, nonspecific but favoring infectious/inflammatory etiology. 2. Clustered nodularity in the apical right upper lobe is stable in size and newly cavitary. These findings are indeterminate for atypical infection such as MAI versus response to therapy  of pulmonary metastases. Additional scattered tiny left pulmonary nodules are stable. Suggest close chest CT follow-up in 3 months. 3. No thoracic adenopathy. 4. Stable dilated main pulmonary artery, suggesting chronic pulmonary arterial hypertension. 5. Aortic Atherosclerosis (ICD10-I70.0).   04/17/2020 Tumor Marker   Her Guardant Reveal showed negative - ctDNA not detected.    07/08/2020 Imaging   CT Chest  IMPRESSION: Slight reduction in size of hypermetabolic right upper lobe pulmonary nodule, nonspecific though favored to represent area of infection/inflammation as was initially suggested on chest CT performed 04/15/2020. No biopsy attempted at this time.   03/08/2021 Imaging   CT Chest w/o contrast  IMPRESSION: 1. The previous FDG avid lung mass within the anterior right lower lobe has decreased in size compared with previous exam compatible with response to therapy. The cavitary lung nodule within the right apex is slightly increased in size from previous exam. Additionally, there is a new irregular nodule within the medial right upper lobe measuring 6 mm. Previously 3 mm. 2. Interval increase in size of cavitary nodule within the right lung apex. This measures 2.3 x 1.5 cm, compared with 1.3 x 0.9 cm previously. Underlying malignancy cannot be excluded. 3. New small nodule within the right upper lobe measures 6 mm. This has a nonspecific appearance but warrants attention on follow-up imaging. 4. The remaining small pulmonary nodules noted previously are stable in the interval. 5. Coronary artery calcifications noted. 6. Aortic Atherosclerosis (ICD10-I70.0).      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   Osteopenia 03/2018   Seen on DEXA Scan  PONV (postoperative nausea and vomiting)      Family History  Problem Relation Age of Onset   Dementia Mother    Lung cancer Father        smoked   Emphysema Father    Heart disease Maternal Grandfather    Cancer Maternal Aunt 60       breast cancer    Cancer Cousin        maternal first cousin with breast cancer in her 52s     Past Surgical History:  Procedure Laterality Date   BACK SURGERY  2003   Patient will occassional back pain since 2003   BIOPSY  09/23/2019   Procedure: BIOPSY;  Surgeon: Otis Brace, MD;  Location: [facility];  Service: Gastroenterology;;  EGD and COLON   BREAST BIOPSY Right 09/2017   BRONCHIAL BIOPSY  08/31/2021   Procedure: BRONCHIAL BIOPSIES;  Surgeon: Garner Nash, DO;  Location: [facility];  Service: Pulmonary;;   BRONCHIAL BRUSHINGS  08/31/2021   Procedure: BRONCHIAL BRUSHINGS;  Surgeon: Garner Nash, DO;  Location: [facility];  Service: Pulmonary;;   BRONCHIAL NEEDLE ASPIRATION BIOPSY  08/31/2021   Procedure: BRONCHIAL NEEDLE ASPIRATION BIOPSIES;  Surgeon: Garner Nash, DO;  Location: [facility];  Service: Pulmonary;;   BRONCHIAL WASHINGS  08/31/2021   Procedure: BRONCHIAL WASHINGS;  Surgeon: Garner Nash, DO;  Location: [place];  Service: Pulmonary;;   CESAREAN SECTION  1985; 1992   COLONOSCOPY WITH PROPOFOL N/A 09/23/2019   Procedure: COLONOSCOPY WITH PROPOFOL and EGD;  Surgeon: Otis Brace, MD;  [facility]: [facility];  Service: Gastroenterology;  Laterality: N/A;    DILATION AND CURETTAGE OF UTERUS     ESOPHAGOGASTRODUODENOSCOPY N/A 09/23/2019   Procedure: ESOPHAGOGASTRODUODENOSCOPY (EGD);  Surgeon: Otis Brace, MD;  Location: [facility];  Service: Gastroenterology;  Laterality: N/A;   FLEXIBLE SIGMOIDOSCOPY N/A 11/22/2019   Procedure: FLEXIBLE SIGMOIDOSCOPY;  Surgeon: [name], MD;  Location: [facility];  Service: General;  Laterality: N/A;   LUMBAR [place] SURGERY  2003   MASTECTOMY Left 12/05/2017   PROPHYLACTIC MASTECTOMY   MASTECTOMY COMPLETE / SIMPLE W/ SENTINEL NODE BIOPSY Right 12/05/2017   WITH RADIOACTIVE SEED TARGETED RIGHT AXIILARY LYMPH NODE EXCISION AND RIGHT SENTINEL LYMPH NODE BIOPSY,    MASTECTOMY WITH RADIOACTIVE SEED GUIDED EXCISION AND AXILLARY SENTINEL LYMPH NODE BIOPSY Bilateral 12/05/2017   Procedure: RIGHT SIMPLE MASTECTOMY WITH RADIOACTIVE SEED TARGETED RIGHT AXIILARY LYMPH NODE EXCISION AND RIGHT SENTINEL LYMPH NODE BIOPSY, LEFT PROPHYLACTIC MASTECTOMY;  Surgeon: [name], MD;  Location: [facility];  Service: General;  Laterality: Bilateral;   SUBMUCOSAL TATTOO INJECTION  09/23/2019   Procedure: SUBMUCOSAL TATTOO INJECTION;  Surgeon: Otis Brace, MD;  [facility]: [facility];  Service: Gastroenterology;;   VIDEO BRONCHOSCOPY WITH RADIAL ENDOBRONCHIAL ULTRASOUND  08/31/2021   Procedure: RADIAL ENDOBRONCHIAL ULTRASOUND;  Surgeon: Garner Nash, DO;  Location: [facility];  Service: Pulmonary;;    Social History   Socioeconomic History   Marital status: Married    Spouse name: Not on file   Number of children: 4   Years of education: Not on file   Highest education level: Not on file  Occupational History   Not on file  Tobacco Use   Smoking status: Never   Smokeless tobacco: Never  Vaping Use   Vaping Use: Never used  Substance and Sexual Activity   Alcohol use: Yes    Alcohol/week: 14.0 standard drinks of alcohol    Types: 14 Glasses of wine per week  Comment: 2-3 3oz glasses of wine per day    Drug use: Never   Sexual activity: Not on file  Other Topics Concern   Not on file  Social History Narrative   12-19-17 Unable to ask abuse questions husband with her today.   Social Determinants of Health   Financial Resource Strain: Not on file  Food Insecurity: Not on file  Transportation Needs: No Transportation Needs (05/03/2018)   PRAPARE - Hydrologist (Medical): No    Lack of Transportation (Non-Medical): No  Physical Activity: Not on file  Stress: Not on file  Social Connections: Not on file  Intimate Partner Violence: Not on file     Allergies  Allergen Reactions   Codeine Nausea And Vomiting and Other (See Comments)    headache     Outpatient Medications Prior to Visit  Medication Sig Dispense Refill   AMBULATORY NON FORMULARY MEDICATION Take 100 mg by mouth daily. Medication Name: Clofazimine 100 capsule 1   azithromycin (ZITHROMAX) 500 MG tablet Take 1 tablet (500 mg total) by mouth daily. 90 tablet 3   Calcium Carbonate-Vitamin D (CALCIUM 600+D PO) Take 1 tablet by mouth daily.     Cholecalciferol (VITAMIN D) 50 MCG (2000 UT) tablet Take 2,000 Units by mouth daily.     gabapentin (NEURONTIN) 300 MG capsule Take 300 mg by mouth daily.     linezolid (ZYVOX) 600 MG tablet Take 1 tablet (600 mg total) by mouth daily. 30 tablet 2   OVER THE COUNTER MEDICATION Take 1 tablet by mouth daily. Juice plus fruit blend     OVER THE COUNTER MEDICATION Take 1 capsule by mouth daily. Juice plus berry blend     OVER THE COUNTER MEDICATION Take 1 capsule by mouth daily. Juice plus veggie blend     pantoprazole (PROTONIX) 40 MG tablet Take 40 mg by mouth every morning.     Probiotic Product (PROBIOTIC PO) Take 2 capsules by mouth daily.     tiZANidine (ZANAFLEX) 4 MG tablet Take 4 mg by mouth daily as needed for muscle spasms.     vitamin E 180 MG (400 UNITS) capsule Take 800 Units by mouth daily.     ALPRAZolam (XANAX) 0.5 MG tablet Take 0.5 mg by mouth  daily as needed for sleep or anxiety. (Patient not taking: Reported on 09/15/2021)     exemestane (AROMASIN) 25 MG tablet TAKE 1 TABLET (25 MG TOTAL) BY MOUTH DAILY AFTER BREAKFAST. (Patient not taking: Reported on 09/15/2021) 90 tablet 3   Menthol-Methyl Salicylate (SALONPAS PAIN RELIEF PATCH) PTCH Apply 1 patch topically daily as needed (pain).     naproxen sodium (ALEVE) 220 MG tablet Take 220-440 mg by mouth daily as needed (pain).     Pseudoephedrine-Naproxen Na (ALEVE-D SINUS & HEADACHE PO) Take 1 tablet by mouth daily as needed (allergies).     No facility-administered medications prior to visit.    Review of Systems  Constitutional:  Negative for chills, fever, malaise/fatigue and weight loss.  HENT:  Negative for hearing loss, sore throat and tinnitus.   Eyes:  Negative for blurred vision and double vision.  Respiratory:  Positive for shortness of breath. Negative for cough, hemoptysis, sputum production, wheezing and stridor.   Cardiovascular:  Negative for chest pain, palpitations, orthopnea, leg swelling and PND.  Gastrointestinal:  Negative for abdominal pain, constipation, diarrhea, heartburn, nausea and vomiting.  Genitourinary:  Negative for dysuria, hematuria and urgency.  Musculoskeletal:  Negative for joint pain  and myalgias.  Skin:  Negative for itching and rash.  Neurological:  Negative for dizziness, tingling, weakness and headaches.  Endo/Heme/Allergies:  Negative for environmental allergies. Does not bruise/bleed easily.  Psychiatric/Behavioral:  Negative for depression. The patient is not nervous/anxious and does not have insomnia.   All other systems reviewed and are negative.    Objective:  Physical Exam Vitals reviewed.  Constitutional:      General: She is not in acute distress.    Appearance: She is well-developed.  HENT:     Head: Normocephalic and atraumatic.  Eyes:     General: No scleral icterus.    Conjunctiva/sclera: Conjunctivae normal.      Pupils: Pupils are equal, round, and reactive to light.  Neck:     Vascular: No JVD.     Trachea: No tracheal deviation.  Cardiovascular:     Rate and Rhythm: Normal rate and regular rhythm.     Heart sounds: Normal heart sounds. No murmur heard. Pulmonary:     Effort: Pulmonary effort is normal. No tachypnea, accessory muscle usage or respiratory distress.     Breath sounds: No stridor. No wheezing, rhonchi or rales.  Abdominal:     General: There is no distension.     Palpations: Abdomen is soft.     Tenderness: There is no abdominal tenderness.  Musculoskeletal:        General: No tenderness.     Cervical back: Neck supple.  Lymphadenopathy:     Cervical: No cervical adenopathy.  Skin:    General: Skin is warm and dry.     Capillary Refill: Capillary refill takes less than 2 seconds.     Findings: Rash present.  Neurological:     Mental Status: She is alert and oriented to person, place, and time.  Psychiatric:        Behavior: Behavior normal.      Vitals:   12/29/21 1152  BP: 122/70  Pulse: 61  SpO2: 94%  Weight: 145 lb 12.8 oz (66.1 kg)  Height: '5\' 3"'  (1.6 m)   94% on RA BMI Readings from Last 3 Encounters:  12/29/21 25.83 kg/m  12/28/21 25.86 kg/m  12/06/21 25.15 kg/m   Wt Readings from Last 3 Encounters:  12/29/21 145 lb 12.8 oz (66.1 kg)  12/28/21 146 lb (66.2 kg)  12/06/21 142 lb (64.4 kg)     CBC    Component Value Date/Time   WBC 7.1 12/06/2021 2023   RBC 4.21 12/06/2021 2023   HGB 12.3 12/06/2021 2023   HGB 12.3 08/06/2021 1000   HCT 37.1 12/06/2021 2023   PLT 321 12/06/2021 2023   PLT 281 08/06/2021 1000   MCV 88.1 12/06/2021 2023   MCH 29.2 12/06/2021 2023   MCHC 33.2 12/06/2021 2023   RDW 13.6 12/06/2021 2023   LYMPHSABS 1.1 12/06/2021 2023   MONOABS 0.7 12/06/2021 2023   EOSABS 0.1 12/06/2021 2023   BASOSABS 0.0 12/06/2021 2023    Chest Imaging: CT chest November 2022: Right upper lobe cavitary lesion and smaller pulmonary  nodules bilaterally.  Increased size of the nodule in the right lung. The patient's images have been independently reviewed by me.     Pulmonary Functions Testing Results:    Latest Ref Rng & Units 12/29/2021   10:50 AM  PFT Results  FVC-Pre L 3.07  P  FVC-Predicted Pre % 105  P  FVC-Post L 3.30  P  FVC-Predicted Post % 113  P  Pre FEV1/FVC % % 48  P  Post FEV1/FCV % % 76  P  FEV1-Pre L 1.48  P  FEV1-Predicted Pre % 67  P  FEV1-Post L 2.50  P  DLCO uncorrected ml/min/mmHg 23.71  P  DLCO UNC% % 125  P  DLCO corrected ml/min/mmHg 24.58  P  DLCO COR %Predicted % 130  P  DLVA Predicted % 118  P  TLC L 6.18  P  TLC % Predicted % 126  P  RV % Predicted % 141  P    P Preliminary result    FeNO: none  Pathology: none  Echocardiogram: none  Heart Catheterization: none    Assessment & Plan:     ICD-10-CM   1. 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 Cavitary lesion of lung  J98.4     3. 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 History of breast cancer  Z85.3     5. History of colon cancer  Z85.038       Discussion:  This is a 71 year old female, evaluation of a right upper lobe cavitary lesion taken for bronchoscopy ultimately diagnosed with Mycobacterium abscessus.  Has a history of breast cancer and colon cancer.  She is a lifelong non-smoker but did have significant secondhand smoke exposure to her parents.  Recent PFTs completed today with evidence of obstruction that is fully reversible with bronchodilation as well as evidence of air trapping and an elevated DLCO.  Plan: Would be suggestive of asthma type symptoms with evidence of obstruction/asthma COPD overlap. She is not really significantly symptomatic. I do think that we can give her albuterol to see if this helps. I would like to avoid inhaled steroids while we are currently undergoing treatments with Mycobacterium abscessus. Symptom wise she is doing okay.  She will go to give her a  new prescription for albuterol to use as needed. If she does have symptoms maybe we can consider adding LAMA/LABA combination to her. Worst-case scenario we can add an inhaled steroid if needed. But right now her symptoms are pretty well controlled. Follow-up with [place] this year if her symptoms from a respiratory standpoint get worse otherwise she is going to see [place] in a year. Continue close follow-up with infectious disease.    Current Outpatient Medications:    albuterol (VENTOLIN HFA) 108 (90 Base) MCG/ACT inhaler, Inhale 2 puffs into the lungs every 6 (six) hours as needed for wheezing or shortness of breath., Disp: 8 g, Rfl: 2   AMBULATORY NON FORMULARY MEDICATION, Take 100 mg by mouth daily. Medication Name: Clofazimine, Disp: 100 capsule, Rfl: 1   azithromycin (ZITHROMAX) 500 MG tablet, Take 1 tablet (500 mg total) by mouth daily., Disp: 90 tablet, Rfl: 3   Calcium Carbonate-Vitamin D (CALCIUM 600+D PO), Take 1 tablet by mouth daily., Disp: , Rfl:    Cholecalciferol (VITAMIN D) 50 MCG (2000 UT) tablet, Take 2,000 Units by mouth daily., Disp: , Rfl:    gabapentin (NEURONTIN) 300 MG capsule, Take 300 mg by mouth daily., Disp: , Rfl:    linezolid (ZYVOX) 600 MG tablet, Take 1 tablet (600 mg total) by mouth daily., Disp: 30 tablet, Rfl: 2   OVER THE COUNTER MEDICATION, Take 1 tablet by mouth daily. Juice plus fruit blend, Disp: , Rfl:    OVER THE COUNTER MEDICATION, Take 1 capsule by mouth daily. Juice plus berry blend, Disp: , Rfl:    OVER THE COUNTER MEDICATION, Take 1 capsule by mouth daily. Juice plus  veggie blend, Disp: , Rfl:    pantoprazole (PROTONIX) 40 MG tablet, Take 40 mg by mouth every morning., Disp: , Rfl:    Probiotic Product (PROBIOTIC PO), Take 2 capsules by mouth daily., Disp: , Rfl:    tiZANidine (ZANAFLEX) 4 MG tablet, Take 4 mg by mouth daily as needed for muscle spasms., Disp: , Rfl:    vitamin E 180 MG (400 UNITS) capsule, Take 800 Units by mouth daily., Disp: , Rfl:     Garner Nash, DO  Pulmonary Critical Care 12/29/2021 12:12 PM

## 2021-12-29 NOTE — Progress Notes (Signed)
Full PFT performed today. °

## 2021-12-31 ENCOUNTER — Other Ambulatory Visit: Payer: Commercial Managed Care - PPO

## 2021-12-31 ENCOUNTER — Other Ambulatory Visit (HOSPITAL_COMMUNITY): Payer: Self-pay

## 2021-12-31 ENCOUNTER — Other Ambulatory Visit: Payer: Self-pay

## 2021-12-31 DIAGNOSIS — A318 Other mycobacterial infections: Secondary | ICD-10-CM

## 2021-12-31 NOTE — Addendum Note (Signed)
Addended by: Caffie Pinto on: 12/31/2021 08:31 AM   Modules accepted: Orders

## 2022-01-04 ENCOUNTER — Telehealth: Payer: Self-pay

## 2022-01-04 NOTE — Telephone Encounter (Signed)
[facility] Patient Advocate Encounter   Received notification from [employer] that prior authorization for Sirturo is required.   PA submitted on 01/04/22 Key [id number] Status is pending    [facility] will continue to follow.   Ileene Patrick, [facility] Patient [facility] Phone: [phone] Fax:  [phone]

## 2022-01-05 ENCOUNTER — Encounter: Payer: Self-pay | Admitting: Pharmacist

## 2022-01-06 ENCOUNTER — Other Ambulatory Visit (HOSPITAL_COMMUNITY): Payer: Self-pay

## 2022-01-06 ENCOUNTER — Telehealth: Payer: Self-pay | Admitting: Pharmacist

## 2022-01-06 ENCOUNTER — Other Ambulatory Visit: Payer: Commercial Managed Care - PPO

## 2022-01-06 ENCOUNTER — Telehealth: Payer: Self-pay

## 2022-01-06 ENCOUNTER — Other Ambulatory Visit: Payer: Self-pay

## 2022-01-06 DIAGNOSIS — A318 Other mycobacterial infections: Secondary | ICD-10-CM

## 2022-01-06 DIAGNOSIS — J189 Pneumonia, unspecified organism: Secondary | ICD-10-CM

## 2022-01-06 MED ORDER — DOXYCYCLINE HYCLATE 100 MG PO TABS: 100.0000 mg | ORAL_TABLET | Freq: Two times a day (BID) | ORAL | 0 refills | Status: DC

## 2022-01-06 MED ORDER — LINEZOLID 600 MG PO TABS: 600.0000 mg | ORAL_TABLET | Freq: Every day | ORAL | 2 refills | Status: DC

## 2022-01-06 NOTE — Telephone Encounter (Signed)
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  Sirturo is only FDA approved for multi-drug resistant tuberculosis.  This encounter will continue to be updated until final determination.    Ileene Patrick, [facility] Patient [facility] for Infectious Disease Phone: [phone] Fax:  [phone]

## 2022-01-06 NOTE — Telephone Encounter (Signed)
Patient was previously taking IV amikacin, IV cefoxitin, azithromycin PO, and clofazimine PO for her mycobacterium abscessus cavitary pneumonia.  She developed a diffuse, red rash on her arms, chest, and back. We held her cefoxitin when we saw her last week. Called her today to see how the rash was doing. She states that the itching has improved but now the redness has given away to a tan color of her skin and she is peeling very badly. Seems to be resolving and she is hopeful it will be gone in a few weeks.   Discussed other options with Dr. Gale Journey. Prior authorization for omadacycline was denied because she must try and fail doxycycline first. Butch Penny also did a prior authorization for bedaquiline. That is in process.  Will have her pick up a prescription for doxycycline to show that she tried and failed. Dr. Gale Journey would also like her to start taking linezolid 600 mg PO daily while we try to get the omadacycline approved.  Spoke to [name] regarding this. She will pick up both prescriptions from [facility]. They should both be $0.   Talked to pharmacist, Amy, at [facility] and gave verbal order per Dr. Gale Journey to discontinue cefoxitin for now. Her new regimen will be IV amikacin three times weekly, azithromycin PO, clofazimine PO, and linezolid PO. Will try to get omadacycline to replace linezolid as it is only a matter of time until she develops thrombocytopenia. Reviewed her labs and they are as follows:  8/18 SCr 1.18; amikacin trough <0.8 8/21 SCr 0.98; amikacin trough <0.8 8/24 amikacin trough <0.8 8/30 amikacin trough 0.9 9/1 amikacin trough 11.3** 9/4 SCr 1.18; amikacin trough 4.5  Unsure if either of the high amikacin troughs were drawn at correct time. It is hard to know with home health. Spoke with Amy regarding this too. She thinks the 11.3 is a peak level or at least drawn while infusing or afterwards. She called the home health nurse to make sure they are being drawn BEFORE the dose. Will keep a close eye on her labs  and follow up on the pending prior authorizations.   Tamecia Mcdougald L. Cleaven Demario, PharmD, BCIDP, AAHIVP, CPP Clinical Pharmacist Practitioner Infectious Diseases [place] for Infectious Disease 01/06/2022, 3:51 PM

## 2022-01-07 NOTE — Telephone Encounter (Signed)
Thank you :)

## 2022-01-10 ENCOUNTER — Other Ambulatory Visit: Payer: Self-pay

## 2022-01-10 ENCOUNTER — Other Ambulatory Visit: Payer: Commercial Managed Care - PPO

## 2022-01-10 DIAGNOSIS — A318 Other mycobacterial infections: Secondary | ICD-10-CM

## 2022-01-10 DIAGNOSIS — J984 Other disorders of lung: Secondary | ICD-10-CM

## 2022-01-11 ENCOUNTER — Telehealth (INDEPENDENT_AMBULATORY_CARE_PROVIDER_SITE_OTHER): Payer: Commercial Managed Care - PPO | Admitting: Internal Medicine

## 2022-01-11 DIAGNOSIS — J852 Abscess of lung without pneumonia: Secondary | ICD-10-CM

## 2022-01-11 NOTE — Telephone Encounter (Signed)
Patient has M-abscessus lung infection   Obviously she needs omadocycline (or tigecycline -- not preferred due to severe gi side effect) as part of regimen  Doxycycline or tetracycline is not an antibiotics that would be used to treat this  Insurance requires her to trial this which is wrong. However, I will do that while we are doing PA for her omadocycline  We had started doxy a few days prior to this note placed   Discussed with ID pharmacy team/patient

## 2022-01-17 ENCOUNTER — Telehealth: Payer: Self-pay | Admitting: Pharmacist

## 2022-01-17 NOTE — Telephone Encounter (Signed)
2 bottles of clofazimine are located in the pharmacy office when patient needs a refill.   Sierrah Luevano L. Makelle Marrone, PharmD, BCIDP, AAHIVP, CPP Clinical Pharmacist Practitioner Infectious Diseases [place] for Infectious Disease 01/17/2022, 2:19 PM

## 2022-01-17 NOTE — Telephone Encounter (Signed)
Appeal completed and sent to patient's insurance for [place]. Will update the chart and Dr. Gale Journey once a determination has been made.  Kathren Scearce L. [place] Welcome, PharmD, BCIDP, AAHIVP, CPP Clinical Pharmacist Practitioner Infectious Diseases [place] for Infectious Disease 01/17/2022, 2:17 PM

## 2022-01-21 ENCOUNTER — Encounter: Payer: Self-pay | Admitting: Internal Medicine

## 2022-01-21 ENCOUNTER — Ambulatory Visit (INDEPENDENT_AMBULATORY_CARE_PROVIDER_SITE_OTHER): Payer: Commercial Managed Care - PPO | Admitting: Internal Medicine

## 2022-01-21 ENCOUNTER — Other Ambulatory Visit: Payer: Self-pay

## 2022-01-21 VITALS — BP 119/73 | HR 76 | Resp 16 | Ht 63.0 in | Wt 145.0 lb

## 2022-01-21 DIAGNOSIS — A319 Mycobacterial infection, unspecified: Secondary | ICD-10-CM | POA: Diagnosis not present

## 2022-01-21 DIAGNOSIS — A318 Other mycobacterial infections: Secondary | ICD-10-CM

## 2022-01-21 NOTE — Patient Instructions (Signed)
Will plan on repeat ct in around 3-6 months  We await omacycline  Continue these 4 antibiotics: Amikacin iv   Azithromycin 1 pill (make sure 500 mg) one tablet once a day Linezolid 1 tablet 600 mg once a day Clofazimine 2 capsules once a day   (Doxy)   I will ask dr Valeta Harms to see if we can bronch you again, but let's wait until the ct scan is available  See me again in around 6-8 weeks

## 2022-01-21 NOTE — Progress Notes (Signed)
[place] for Infectious Disease  Reason for Consult:cavitary lung disease/infection Referring Provider: June Leap    Patient Active Problem List   Diagnosis Date Noted   Cavitary lesion of lung 08/08/2021   S/P laparoscopic-assisted sigmoidectomy 11/22/2019   Genetic testing 10/30/2019   Family history of breast cancer    Cancer of sigmoid colon (Sonia Sandoval) 09/26/2019   Iron deficiency anemia due to chronic blood loss 09/26/2019   Symptomatic anemia 09/21/2019   GI bleed 09/20/2019   Malignant neoplasm of lower-outer quadrant of right breast of female, estrogen receptor positive (Sonia Sandoval) 10/27/2017    Cc -- reason for consult = ntm cavitary lung disease  HPI: Sonia Sandoval is a 71 y.o. 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 Was supposed to be seen several months prior but didn't show.    5/02 underwent bronch: "The distinct navigation pathways prepared prior to this procedure were then utilized to navigate to patient's lesion identified on CT scan. The robotic catheter was secured into place and the vision probe was withdrawn.  Lesion location was approximated using fluoroscopy and radial endobronchial ultrasound for peripheral targeting. Under fluoroscopic guidance transbronchial needle brushings, transbronchial needle biopsies, and transbronchial forceps biopsies were performed to be sent for cytology and pathology. A bronchioalveolar lavage was performed in the right upper lobe and sent for microbiology.  Additional transbronchial biopsies  sent for tissue culture.   At the end of the procedure a general airway inspection was performed and there was no evidence of active bleeding. The bronchoscope was removed.  The patient tolerated the procedure well. There was no significant blood loss and there were no obvious complications. A post-procedural chest x-ray is pending."  Cytology didn't see cancer cells  On micro -- something is growing that looks like ntm and the [employer] technology calls it abscessus but it was sent on 5/8th to reference lab to identify   Patient without respiratory sx outside of stable mild dyspnea on exertion 4-5 months. She would encounter this walking up a hill No b sx Has watery eyes and a recent dry cough but query if it's allergy No reflux No decreased appetite No weight loss   11/04/21 id clinic f/u Patient is back for f/u post susceptibility testing which show azithromycin/amikacin sensitive m abscessus She continues to feel well without b sx, cough, dyspnea, chest pain She is accompanied by her significant others today  12/28/21 id clinic f/u Patient developed itchy rash about 10 days ago involving trunk/ext. Takes benadryl and topical hydrocortisone. 2 benadryls to help sleep. Also takes claritin. Rash is getting worse Opat labs elevated lft but improving (moderate in 100s), without eosinophilia Amikacin trough 8/24 is 0.8 with creatinine 0.98 No fever chill Occasional cough Went to ed 8/08 for sob after extreme exertion and chest ct was done -- no pe. Improved cavitation.  No joint pain/muscle aching No new vision change; she endorses having cataracts Has chronic leg cramps which she has been using tonic water for 6 months. Question of some tingling in hands/feet very mild  At some point  she has recurrent elevation ak trough and cr and we had planned to substitute amikacin with linezolid but she done well as above with AK....  01/21/22 id clinic f/u Cefoxitin was hold first week of 12/2021 Her  rash is much better She does at one point had yeast vaginal infection and thrush about 2 weeks ago (she got fluconazole for vaginal yeast dose and some other topical gel otc for her mouth). She still feel some taste change on her tongue/feels raw as well with hot temperature especially No visual changes No numbness/tingling hands feet Current regimen: Amikacin Azith Clofazimine Linezolid (Doxy) Waiting for omadocycline approval again from insurance No n/v/diarrhea Rare cough Stable short of breath  She takes b6 daily for a while now  She had submitted 3 afb sputum but said the sputum are mostly upper oral. She can't get sputum up  Review of Systems: ROS All other ros negative      Past Medical History:  Diagnosis Date   Allergies 1995   Per patient report 11/14/18   Anemia    prior to finding colon cancer, no longer any issues with this per patient   Breast cancer Sonia Sandoval)    Breast cancer, right breast (Sonia Sandoval) 09/2017   S/P mastectomy 12/05/2017   Colon cancer (Sonia Sandoval)    COPD (chronic obstructive pulmonary disease) (Sonia Sandoval)    pt reports previous MD diagnosed her with this, but takes no medications   Dyspnea    occasionally with exertion   Family history of breast cancer    Headache 1968   per patient report, started as a teenager   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   Osteopenia 03/2018   Seen on DEXA Scan   PONV (postoperative nausea and vomiting)     Social History   Tobacco Use   Smoking status: Never   Smokeless tobacco: Never  Vaping Use   Vaping Use: Never used  Substance Use Topics   Alcohol use: Yes    Alcohol/week: 14.0 standard drinks of alcohol    Types: 14 Glasses of wine per week    Comment: 2-3 3oz glasses of wine per day   Drug use: Never    Family History  Problem Relation Age of Onset   Dementia Mother    Lung cancer Father         smoked   Emphysema Father    Heart disease Maternal Grandfather    Cancer Maternal Aunt 60       breast cancer    Cancer Cousin        maternal first cousin with breast cancer in her 37s    Allergies  Allergen Reactions   Codeine Nausea And Vomiting and Other (See Comments)    headache    OBJECTIVE: Vitals:   01/21/22 1128  BP: 119/73  Pulse: 76  Resp: 16  SpO2: 96%  Weight: 145 lb (65.8 kg)  Height: _0  (1.6 m)   Body mass index is 25.69 kg/m.   Physical Exam General/constitutional: no distress, pleasant HEENT: Normocephalic, PER, Conj Clear, EOMI, Oropharynx clear Neck supple CV: rrr no mrg Lungs: clear to auscultation, normal respiratory effort Abd: Soft, Nontender Ext: no edema Skin: No Rash Neuro: nonfocal MSK: no peripheral joint swelling/tenderness/warmth; back spines nontender        Lab: 9/18 opat lab Cr 0.7; amikacin trough 4; cbc stable; lft normal  Crp 1   Lab Results  Component Value Date   WBC 7.1 12/06/2021   HGB 12.3 12/06/2021   HCT 37.1 12/06/2021   MCV 88.1 12/06/2021   PLT 321 75/01/2584   Last metabolic panel Lab Results  Component Value Date   GLUCOSE 112 (H) 12/06/2021   NA 138 12/06/2021   K 4.4 12/06/2021   CL 105 12/06/2021   CO2 24 12/06/2021   BUN 19 12/06/2021   CREATININE 0.97 12/06/2021   GFRNONAA >60 12/06/2021   CALCIUM 9.2 12/06/2021   PHOS 3.6 11/14/2018   PROT 7.0 12/06/2021   ALBUMIN 4.2 12/06/2021   BILITOT 0.3 12/06/2021   ALKPHOS 137 (H) 12/06/2021   AST 60 (H) 12/06/2021   ALT 148 (H) 12/06/2021   ANIONGAP 9 12/06/2021    Microbiology:  Serology:  Imaging: I have reviewed the imaging personally and incorporated into decision making  08/06/21 chest abd pelv ct with contrast FINDINGS: CT CHEST FINDINGS   Cardiovascular: Aortic atherosclerosis. Normal heart size. No pericardial effusion.   Mediastinum/Nodes: No enlarged mediastinal, hilar, or axillary lymph nodes. Thyroid gland,  trachea, and esophagus demonstrate no significant findings.   Lungs/Pleura: Interval increase in wall thickening and cavitation within the right pulmonary apex, overall lesion now confluent and measuring 3.8 x 3.1 cm (series 4, image 24). Increased clustered centrilobular nodularity and small consolidations in this vicinity (series 4, image 37). Residual bandlike scarring in the bilateral lung bases (series 4, image 109, 122). Occasional small nodules of the left lower lobe measuring up to 0.5 cm (series 4, image 21). No pleural effusion or pneumothorax.   Musculoskeletal: No chest wall mass or suspicious osseous lesions identified. Status post bilateral mastectomy.   CT ABDOMEN PELVIS FINDINGS   Hepatobiliary: No solid liver abnormality is seen. No gallstones, gallbladder wall thickening, or biliary dilatation.   Pancreas: Unremarkable. No pancreatic ductal dilatation or surrounding inflammatory changes.   Spleen: Normal in size without significant abnormality.   Adrenals/Urinary Tract: Adrenal glands are unremarkable. Punctuate nonobstructive calculus in the inferior pole of the right kidney (series 5, image 89). No left-sided calculi, ureteral calculi, or hydronephrosis. Bladder is unremarkable.   Stomach/Bowel: Stomach is within normal limits. Appendix is not clearly visualized and may be surgically absent. The cecum is anterior to the liver. Status post sigmoid colon resection and reanastomosis. No evidence of bowel wall thickening, distention, or inflammatory changes.   Vascular/Lymphatic: Aortic atherosclerosis. No enlarged abdominal or pelvic lymph nodes.   Reproductive: No mass or other abnormality.   Other: No abdominal wall hernia or abnormality. No ascites.   Musculoskeletal: No acute osseous findings.   IMPRESSION: 1. Status post sigmoid colon resection and reanastomosis. No evidence of lymphadenopathy or metastatic disease in the chest, abdomen, or  pelvis. 2. Interval increase in wall thickening and cavitation within the right pulmonary apex, overall lesion now confluent and measuring 3.8 x 3.1 cm. Increased clustered centrilobular nodularity and small consolidations in this vicinity. Findings are consistent with ongoing, worsened cavitary infection. 3. Additional small nodules of the left lower lobe measuring up to 0.5 cm, unchanged and almost certainly benign, incidental sequelae of infection inflammation. 4. Nonobstructive right nephrolithiasis.   12/07/21  chest cta 1. No pulmonary embolism. No acute intrathoracic pathology identified. 2. Slight interval decrease in size of a cavitary lesion within the right upper lobe in keeping with at least partial response to therapy. Repeat imaging in 6-12 months would be helpful in confirming stability or resolution exclude the possibility of an  underlying cavitating malignancy 3. Stable 5 mm subpleural pulmonary nodule within the left lower lobe, safely considered benign.   Assessment/plan: Problem List Items Addressed This Visit   None Visit Diagnoses     Nontuberculous mycobacterial infection    -  Primary   Mycobacterium abscessus infection          Abx: (9/7-c doxy) 9/6-c linezolid 7/6-c amikacin 7/6-c azith 7/6-c clofazimine  7/6-9/07 cefoxitin    Discussed natural history, epidimiology, pathogenesis, diagnosis criteria and treatment indication for ntm lungs  Discussed resistant nature of abscessus, cure rate, relapse rate  At this time she is completely assymptomatic but imaging has indicated progression of cavitary lesion. On cytology there was no malignant cells. She had hx of colon cancer and breast cancer at least 3 years prior to this admission, and all are in remission  At this time culture is still pending final ID confirmation and susceptibility testing  I would treat her based on progression of ct scan  Will await susceptibility and follow up with  her in around 4-6 weeks to decide on treatment regimen    7/6 assessment Doing well Due to cavitary progression will do 2 iv's and 2 po's  Initial regimen will be iv amikacin, cefoxitin and po clofazimine and azithromycin  The initial regimen will be at least 1-3 months as long as she can tolerate.  After the 3 months potentially we can keep 1 iv and the 2 PO's or transition her iv's to inhaled amikacin with the 2 po's  Our pharmacist team speaks with her today. Will start [place] and azith today  Once picc is in she can start the cefoxitin continuous daily infusion and tiw amikacin   Repeat chest ct in 6-8 weeks Repeat afb sputum x3 1 month after starting medication   8/29 assessment Ct improved on 8/8 Rash ?cefoxitin. No sign of DRESS or arthus reaction otherwise. Will look in to the newer tetracycline along with bedaquilline for her. Can continue as is for now her current drug regimen 3 sputum early morning ordered for afb testing -- discussed to do it before eating/drinking/brushing/gargling Repeat chest ct in 3-6 months   Discussed with our id pharmacy team   ----------- 01/21/22 id assessment For some reason insurance is making it difficult in getting omadocycline approved. They want her to try doxycycline first  There is no evidence doxycycline works for [employer], and omadocycline or tigecycline would need to be the back bone of treatment (tigecycline of course much more adverse side effect).  Our pharmacist is trying to get this approved with insurance. Twice now, awaiting result  She is on amikacin, azith, clofazimine, linezolid now along with doxy (as requested by the insurance).  Cefoxitin stopped due to rash thought to be related to it.  She is tolerating it  Follow up with me in 4-6 weeks  I will ask dr Valeta Harms about repeating a bronch in 3-6 months to get sputum again as she can't get good sputum by herself. I am not confident I can trust her  expectorated sputum for sterility check  Will plan on repeat chest ct in another 3 months and see if dr Valeta Harms could help get some sputum in 3-6 months  I spent more than 40 minute reviewing data/chart, and coordinating care and >50% direct face to face time providing counseling/discussing diagnostics/treatment plan with patient      Follow-up: Return in about 8 weeks (around 03/18/2022).  Jabier Mutton, [place] for Infectious  Disease [facility] 01/21/2022, 11:36 AM

## 2022-01-25 ENCOUNTER — Telehealth: Payer: Self-pay | Admitting: Pharmacist

## 2022-01-25 NOTE — Telephone Encounter (Signed)
Diagnosis: Mycobacterium abscessus cavitary pneumonia    Allergies  Allergen Reactions   Codeine Nausea And Vomiting and Other (See Comments)    headache    OPAT Orders Discharge antibiotics to be given via PICC line Discharge antibiotics: Per pharmacy protocol   Amikacin 1200 mg IV three times weekly (continue) Tygacil 100 mg IV x 1 then 50 mg IV q12 hr (new start)  Oral antibiotics also on board: - Azithromycin 500 mg PO daily - Clofazimine 100 mg PO daily  Duration: 3 months End Date: Around 04/26/22  PIC Care Per Protocol:  Home health RN for IV administration and teaching; PICC line care and labs.    Labs weekly while on IV antibiotics: _x_ CBC with differential __ BMP  _x_ CMP _x_ CRP _x_ ESR _x_ amikacin trough   __ Please pull PIC at completion of IV antibiotics _x_ Please leave PIC in place until doctor has seen patient or been notified  Fax weekly labs to [phone]  Clinic Follow Up Appt: 03/16/2022 with Dr. Vu at 230pm  Cassie L. Kuppelweiser, PharmD, BCIDP, AAHIVP, CPP Clinical Pharmacist Practitioner Infectious Diseases Clinical Pharmacist [facility] 01/25/2022, 3:19 PM   

## 2022-01-25 NOTE — Telephone Encounter (Signed)
Sonia Sandoval appeal was denied x 3. Her insurance wants her to try linezolid and tigecycline first even though it was explained in the PA why these options are not favorable. Discussed with Dr. Dominic Pea out to Sonia Sandoval and asked her to look into tigecycline price, which ended up being $0 through her insurance.  Called Sonia Sandoval to discuss. Advised that [name] appeal was denied and that we would like to start her on another IV antibiotic, tigecycline. Explained that this would be a twice daily infusion that lasts anywhere from 30-60 minutes. Also advised that the most common side effects are nausea and vomiting and those occur with up to 35% of patients who start. Asked her to reach out to [place] if she develops this and it does not stop after a few days to a week. Also advised her to stop the linezolid when she starts the tigecycline. Her regimen will be:  - Amikacin 1200 mg IV three times weekly - Tigecycline 50 mg IV q12 hours (after initial 100 mg IV x 1 load) - Azithromycin 500 mg PO daily - Clofazimine 100 mg PO daily  Reached out to [facility] again and they will order medication to arrive tomorrow. She is hopeful they will be out on Thursday to administer first dose of tigecycyline in [place] home. Will place OPAT order in chart as well.   Zairah Arista L. Braylen Denunzio, PharmD, BCIDP, AAHIVP, CPP Clinical Pharmacist Practitioner [place] [facility] 01/25/2022, 3:16 PM

## 2022-01-26 NOTE — Telephone Encounter (Signed)
Patty, Pharmacist with [employer] called office for verbal orders. Relayed orders from opat not done on 9/26. Will forward community message to Carolynn Sayers and [place] team with opat.  P: [phone] Leatrice Jewels, RMA

## 2022-01-28 ENCOUNTER — Telehealth: Payer: Self-pay | Admitting: Pharmacist

## 2022-01-28 NOTE — Telephone Encounter (Signed)
Patient called pharmacy this morning before her [facility] nurse arrived for her first tigecycline infusion. She stated she was concerned about the warning for higher deaths with tigecycline as compared to other antibiotics. I reviewed that this warning is based on studies regarding bloodstream infections and how tigecycline does not stay in the bloodstream well and should not be used for bloodstream infections. I reviewed that its tendency to leave the blood and distribute into tissues is good news for her pulmonary infection. I reviewed that this is not a preferred first-line regimen due to its high rates of nausea and vomiting but that it is our next best option due to insurance barriers and resistance. She verbalized understanding and will receive her first injection today.  Alfonse Spruce, PharmD, CPP, [place] Clinical Pharmacist Practitioner Infectious [place] [facility]

## 2022-02-02 ENCOUNTER — Ambulatory Visit: Payer: Self-pay | Admitting: Pharmacist

## 2022-02-02 ENCOUNTER — Telehealth: Payer: Self-pay | Admitting: Pharmacist

## 2022-02-02 NOTE — Telephone Encounter (Signed)
Third level of appeal denied for [name]. Checked with Pam at [facility] and IV Elesa Hacker is covered 100%.   Spoke with [name] this morning. She was tearful and having a very hard time with her antibiotic regimen, particularly the Tygacil. She has been nauseated constantly since starting last Friday and has vomited at least 1-2 times each day. She has lost 7 lbs since Friday as well. Also experiencing little to no energy and no appetite.   She is requesting an antibiotic holiday. She states that her body and mind need a break as the last week has been affecting her daily life and well being. She originally stated that she could still potentially take the azithromycin and clofazimine, but I advised her to either take all antibiotics (IV and oral) prescribed or stop everything to avoid resistance developing.  She will stop everything today and take a break. Discussed this with Dr. Gale Journey. He is fine with holiday and wanted to see her in clinic next week to discuss goals and treatment plan moving forward. Scheduled her to see Dr. Gale Journey and myself next Tuesday 10/10 in the afternoon.   Spoke with Pam at [facility]. Advised her to please hold all IV antibiotics (amikacin, tygacil, and nuzyra). Pam will call [name] to discuss PICC line care, and she will continue with PICC at least until her appointment next week.   Sigourney Portillo L. Delron Comer, PharmD [facility] Clinical Pharmacist Practitioner

## 2022-02-08 ENCOUNTER — Encounter: Payer: Self-pay | Admitting: Internal Medicine

## 2022-02-08 ENCOUNTER — Ambulatory Visit (INDEPENDENT_AMBULATORY_CARE_PROVIDER_SITE_OTHER): Payer: Commercial Managed Care - PPO | Admitting: Pharmacist

## 2022-02-08 ENCOUNTER — Other Ambulatory Visit: Payer: Self-pay

## 2022-02-08 ENCOUNTER — Ambulatory Visit (INDEPENDENT_AMBULATORY_CARE_PROVIDER_SITE_OTHER): Payer: Commercial Managed Care - PPO | Admitting: Internal Medicine

## 2022-02-08 VITALS — BP 113/73 | HR 79 | Temp 98.4°F | Ht 63.0 in | Wt 145.0 lb

## 2022-02-08 DIAGNOSIS — J189 Pneumonia, unspecified organism: Secondary | ICD-10-CM | POA: Diagnosis not present

## 2022-02-08 DIAGNOSIS — A318 Other mycobacterial infections: Secondary | ICD-10-CM | POA: Diagnosis not present

## 2022-02-08 DIAGNOSIS — J984 Other disorders of lung: Secondary | ICD-10-CM

## 2022-02-08 DIAGNOSIS — J181 Lobar pneumonia, unspecified organism: Secondary | ICD-10-CM | POA: Diagnosis not present

## 2022-02-08 NOTE — Progress Notes (Signed)
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 Was supposed to be seen several months prior but didn't show.    5/02 underwent bronch: "The distinct navigation pathways prepared prior to this procedure were then utilized to navigate to patient's lesion identified on CT scan. The robotic catheter was secured into place and the vision probe was withdrawn.  Lesion location was approximated using fluoroscopy and radial endobronchial ultrasound for peripheral targeting. Under fluoroscopic guidance transbronchial needle brushings, transbronchial needle biopsies, and transbronchial forceps biopsies were performed to be sent for cytology and pathology. A bronchioalveolar lavage was performed in the right upper lobe and sent for microbiology.  Additional  transbronchial biopsies sent for tissue culture.   At the end of the procedure a general airway inspection was performed and there was no evidence of active bleeding. The bronchoscope was removed.  The patient tolerated the procedure well. There was no significant blood loss and there were no obvious complications. A post-procedural chest x-ray is pending."  Cytology didn't see cancer cells  On micro -- something is growing that looks like ntm and the [place] technology calls it abscessus but it was sent on 5/8th to reference lab to identify   Patient without respiratory sx outside of stable mild dyspnea on exertion 4-5 months. She would encounter this walking up a hill No b sx Has watery eyes and a recent dry cough but query if it's allergy No reflux No decreased appetite No weight loss   11/04/21 id clinic f/u Patient is back for f/u post susceptibility testing which show azithromycin/amikacin sensitive m abscessus She continues to feel well without b sx, cough, dyspnea, chest pain She is accompanied by her significant others today  12/28/21 id clinic f/u Patient developed itchy rash about 10 days ago involving trunk/ext. Takes benadryl and topical hydrocortisone. 2 benadryls to help sleep. Also takes claritin. Rash is getting worse Opat labs elevated lft but improving (moderate in 100s), without eosinophilia Amikacin trough 8/24 is 0.8 with creatinine 0.98 No fever chill Occasional cough Went to ed 8/08 for sob after extreme exertion and chest ct was done -- no pe. Improved cavitation.  No joint pain/muscle aching No new vision change; she endorses having cataracts Has chronic leg cramps which she has been using tonic water for 6 months. Question of some tingling in hands/feet very mild  At some point she has recurrent elevation ak trough and cr and we had planned to substitute amikacin with linezolid but she done well as above with AK....  01/21/22 id clinic f/u Cefoxitin was hold  first week of 12/2021 Her rash is much better She does at one point had yeast vaginal infection and thrush about 2 weeks ago (she got fluconazole for vaginal yeast dose and some other topical gel otc for her mouth). She still feel some taste change on her tongue/feels raw as well with hot temperature especially No visual changes No numbness/tingling hands feet Current regimen: Amikacin Azith Clofazimine Linezolid (Doxy) Waiting for omadocycline approval again from insurance No n/v/diarrhea Rare cough Stable short of breath  She takes b6 daily for a while now  She had submitted 3 afb sputum but said the sputum are mostly upper oral. She can't get sputum up   02/08/22 id clinic f/u Patient last took abx a week ago. She was on tigycline at that time and had severe intolerable nausea/vomiting The doxycycline is out of the question is it should never be used to m-abscessus  She is ready to start again   Review of Systems: ROS All other ros negative      Past Medical History:  Diagnosis Date   Allergies 1995   Per patient report 11/14/18   Anemia    prior to finding colon cancer, no longer any issues with this per patient   Breast cancer [facility])    Breast cancer, right breast ([place]) 09/2017   S/P mastectomy 12/05/2017   Colon cancer ([place])    COPD (chronic obstructive pulmonary disease) ([place])    pt reports previous MD diagnosed her with this, but takes no medications   Dyspnea    occasionally with exertion   Family history of breast cancer    Headache 1968   per patient report, started as a teenager   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   Osteopenia 03/2018   Seen on DEXA Scan   PONV (postoperative nausea and vomiting)     Social History   Tobacco Use   Smoking status: Never   Smokeless tobacco: Never  Vaping Use   Vaping Use: Never used  Substance Use  Topics   Alcohol use: Yes    Alcohol/week: 14.0 standard drinks of alcohol    Types: 14 Glasses of wine per week    Comment: 2-3 3oz glasses of wine per day   Drug use: Never    Family History  Problem Relation Age of Onset   Dementia Mother    Lung cancer Father        smoked   Emphysema Father    Heart disease Maternal Grandfather    Cancer Maternal Aunt 60       breast cancer    Cancer Cousin        maternal first cousin with breast cancer in her 15s    Allergies  Allergen Reactions   Tigecycline Diarrhea and Nausea And Vomiting   Codeine Nausea And Vomiting and Other (See Comments)    headache    OBJECTIVE: Vitals:   02/08/22 1443  BP: 113/73  Pulse: 79  Temp: 98.4 F (36.9 C)  TempSrc: Oral  Weight: 145 lb (65.8 kg)  Height: 5' 3" (1.6 m)   Body mass index is 25.69 kg/m.   Physical Exam General/constitutional: no  distress, pleasant HEENT: Normocephalic, PER, Conj Clear, EOMI, Oropharynx clear Neck supple CV: rrr no mrg Lungs: clear to auscultation, normal respiratory effort Abd: Soft, Nontender Ext: no edema Skin: No Rash Neuro: nonfocal MSK: no peripheral joint swelling/tenderness/warmth; back spines nontender         Lab: 9/18 opat lab Cr 0.7; amikacin trough 4; cbc stable; lft normal Crp 1   Lab Results  Component Value Date   WBC 7.1 12/06/2021   HGB 12.3 12/06/2021   HCT 37.1 12/06/2021   MCV 88.1 12/06/2021   PLT 321 12/06/2021   Last metabolic panel Lab Results  Component Value Date   GLUCOSE 112 (H) 12/06/2021   NA 138 12/06/2021   K 4.4 12/06/2021   CL 105 12/06/2021   CO2 24 12/06/2021   BUN 19 12/06/2021   CREATININE 0.97 12/06/2021   GFRNONAA >60 12/06/2021   CALCIUM 9.2 12/06/2021   PHOS 3.6 11/14/2018   PROT 7.0 12/06/2021   ALBUMIN 4.2 12/06/2021   BILITOT 0.3 12/06/2021   ALKPHOS 137 (H) 12/06/2021   AST 60 (H) 12/06/2021   ALT 148 (H) 12/06/2021   ANIONGAP 9 12/06/2021     Microbiology:  Serology:  Imaging: I have reviewed the imaging personally and incorporated into decision making  08/06/21 chest abd pelv ct with contrast FINDINGS: CT CHEST FINDINGS   Cardiovascular: Aortic atherosclerosis. Normal heart size. No pericardial effusion.   Mediastinum/Nodes: No enlarged mediastinal, hilar, or axillary lymph nodes. Thyroid gland, trachea, and esophagus demonstrate no significant findings.   Lungs/Pleura: Interval increase in wall thickening and cavitation within the right pulmonary apex, overall lesion now confluent and measuring 3.8 x 3.1 cm (series 4, image 24). Increased clustered centrilobular nodularity and small consolidations in this vicinity (series 4, image 37). Residual bandlike scarring in the bilateral lung bases (series 4, image 109, 122). Occasional small nodules of the left lower lobe measuring up to 0.5 cm (series 4, image 21). No pleural effusion or pneumothorax.   Musculoskeletal: No chest wall mass or suspicious osseous lesions identified. Status post bilateral mastectomy.   CT ABDOMEN PELVIS FINDINGS   Hepatobiliary: No solid liver abnormality is seen. No gallstones, gallbladder wall thickening, or biliary dilatation.   Pancreas: Unremarkable. No pancreatic ductal dilatation or surrounding inflammatory changes.   Spleen: Normal in size without significant abnormality.   Adrenals/Urinary Tract: Adrenal glands are unremarkable. Punctuate nonobstructive calculus in the inferior pole of the right kidney (series 5, image 89). No left-sided calculi, ureteral calculi, or hydronephrosis. Bladder is unremarkable.   Stomach/Bowel: Stomach is within normal limits. Appendix is not clearly visualized and may be surgically absent. The cecum is anterior to the liver. Status post sigmoid colon resection and reanastomosis. No evidence of bowel wall thickening, distention, or inflammatory changes.   Vascular/Lymphatic: Aortic  atherosclerosis. No enlarged abdominal or pelvic lymph nodes.   Reproductive: No mass or other abnormality.   Other: No abdominal wall hernia or abnormality. No ascites.   Musculoskeletal: No acute osseous findings.   IMPRESSION: 1. Status post sigmoid colon resection and reanastomosis. No evidence of lymphadenopathy or metastatic disease in the chest, abdomen, or pelvis. 2. Interval increase in wall thickening and cavitation within the right pulmonary apex, overall lesion now confluent and measuring 3.8 x 3.1 cm. Increased clustered centrilobular nodularity and small consolidations in this vicinity. Findings are consistent with ongoing, worsened cavitary infection. 3. Additional small nodules of the left lower lobe measuring up to 0.5 cm, unchanged and almost certainly benign, incidental sequelae of   infection inflammation. 4. Nonobstructive right nephrolithiasis.   12/07/21  chest cta 1. No pulmonary embolism. No acute intrathoracic pathology identified. 2. Slight interval decrease in size of a cavitary lesion within the right upper lobe in keeping with at least partial response to therapy. Repeat imaging in 6-12 months would be helpful in confirming stability or resolution exclude the possibility of an underlying cavitating malignancy 3. Stable 5 mm subpleural pulmonary nodule within the left lower lobe, safely considered benign.   Assessment/plan: Problem List Items Addressed This Visit   None Abx: 10/03 - c will plan as below   (9/7-10/03 doxy) 9/6-10/03 linezolid 7/6-10/03 amikacin 7/6-10/03 azith 7/6-10/03 clofazimine  7/6-9/07 cefoxitin    Discussed natural history, epidimiology, pathogenesis, diagnosis criteria and treatment indication for ntm lungs  Discussed resistant nature of abscessus, cure rate, relapse rate  At this time she is completely assymptomatic but imaging has indicated progression of cavitary lesion. On cytology there was no malignant  cells. She had hx of colon cancer and breast cancer at least 3 years prior to this admission, and all are in remission  At this time culture is still pending final ID confirmation and susceptibility testing  I would treat her based on progression of ct scan  Will await susceptibility and follow up with her in around 4-6 weeks to decide on treatment regimen    7/6 assessment Doing well Due to cavitary progression will do 2 iv's and 2 po's  Initial regimen will be iv amikacin, cefoxitin and po clofazimine and azithromycin  The initial regimen will be at least 1-3 months as long as she can tolerate.  After the 3 months potentially we can keep 1 iv and the 2 PO's or transition her iv's to inhaled amikacin with the 2 po's  Our pharmacist team speaks with her today. Will start clofaz and azith today  Once picc is in she can start the cefoxitin continuous daily infusion and tiw amikacin   Repeat chest ct in 6-8 weeks Repeat afb sputum x3 1 month after starting medication   8/29 assessment Ct improved on 8/8 Rash ?cefoxitin. No sign of DRESS or arthus reaction otherwise. Will look in to the newer tetracycline along with bedaquilline for her. Can continue as is for now her current drug regimen 3 sputum early morning ordered for afb testing -- discussed to do it before eating/drinking/brushing/gargling Repeat chest ct in 3-6 months   Discussed with our id pharmacy team   ----------- 01/21/22 id assessment For some reason insurance is making it difficult in getting omadocycline approved. They want her to try doxycycline first  There is no evidence doxycycline works for m-abscessus overall, and omadocycline or tigecycline would need to be the back bone of treatment (tigecycline of course much more adverse side effect).  Our pharmacist is trying to get this approved with insurance. Twice now, awaiting result  She is on amikacin, azith, clofazimine, linezolid now along with doxy (as  requested by the insurance).  Cefoxitin stopped due to rash thought to be related to it.  She is tolerating it  Follow up with me in 4-6 weeks  I will ask dr Icard about repeating a bronch in 3-6 months to get sputum again as she can't get good sputum by herself. I am not confident I can trust her expectorated sputum for sterility check  Will plan on repeat chest ct in another 3 months and see if dr Icard could help get some sputum in 3-6 months  02/08/22 id assessment   Patient ready to start abx again. Couldn't tolerate tigecycline Iv omadocycline is covered by her insurance. They had several times unreasonably and illegally denied oral omadocycline  Will resume amikacin and omadocycline iv, with oral azithromycin and clofazimine  Plan 3 months iv then transition to oral hopefully by that time her sputum has sterilization of m-abscessus (so around 05/2022)   OPAT Orders Discharge antibiotics to be given via PICC line Discharge antibiotics: Oral azithromycin 250 mg po daily and clofazimine 100 mg po daily  Iv amikacin 1200 iv tiw Iv omadocycline loading 200 mg on day 1 once, then 100 mg iv daily thereafter  Aim for Vancomycin trough 15-20 or AUC 400-550 (unless otherwise indicated) Duration: 3 months with iv End Date: 05/11/2022  PIC Care Per Protocol:  Home health RN for IV administration and teaching; PICC line care and labs.    Labs weekly while on IV antibiotics: _x_ CBC with differential __ BMP _x_ twice weekly CMP _x_ CRP _x_ twice weekly amikacin trough __ ESR __ Vancomycin trough __ CK  __ Please pull PIC at completion of IV antibiotics _x_ Please leave PIC in place until doctor has seen patient or been notified  Fax weekly labs to [phone]   [facility] clinic [address], [place], [place] [address] Phone: [phone]      Follow-up: No follow-ups on file.  Trung T Vu, MD [facility] 02/08/2022, 3:25 PM  

## 2022-02-08 NOTE — Progress Notes (Signed)
02/08/2022  HPI: Sonia Sandoval is a 71 y.o. female who presents to the [place] clinic today to follow up for her Mycobacterium abscessus cavitary pneumonia.  Patient Active Problem List   Diagnosis Date Noted   Cavitary lesion of lung 08/08/2021   S/P laparoscopic-assisted sigmoidectomy 11/22/2019   Genetic testing 10/30/2019   Family history of breast cancer    Cancer of sigmoid colon ([place]) 09/26/2019   Iron deficiency anemia due to chronic blood loss 09/26/2019   Symptomatic anemia 09/21/2019   GI bleed 09/20/2019   Malignant neoplasm of lower-outer quadrant of right breast of female, estrogen receptor positive ([place]) 10/27/2017    Patient's Medications  New Prescriptions   No medications on file  Previous Medications   ALBUTEROL (VENTOLIN HFA) 108 (90 BASE) MCG/ACT INHALER    Inhale 2 puffs into the lungs every 6 (six) hours as needed for wheezing or shortness of breath.   CALCIUM CARBONATE-VITAMIN D (CALCIUM 600+D PO)    Take 1 tablet by mouth daily.   CHOLECALCIFEROL (VITAMIN D) 50 MCG (2000 UT) TABLET    Take 2,000 Units by mouth daily.   GABAPENTIN (NEURONTIN) 300 MG CAPSULE    Take 300 mg by mouth daily.   OVER THE COUNTER MEDICATION    Take 1 tablet by mouth daily. Juice plus fruit blend   OVER THE COUNTER MEDICATION    Take 1 capsule by mouth daily. Juice plus berry blend   OVER THE COUNTER MEDICATION    Take 1 capsule by mouth daily. Juice plus veggie blend   PANTOPRAZOLE (PROTONIX) 40 MG TABLET    Take 40 mg by mouth every morning.   PROBIOTIC PRODUCT (PROBIOTIC PO)    Take 2 capsules by mouth daily.   TIZANIDINE (ZANAFLEX) 4 MG TABLET    Take 4 mg by mouth daily as needed for muscle spasms.   VITAMIN E 180 MG (400 UNITS) CAPSULE    Take 800 Units by mouth daily.  Modified Medications   No medications on file  Discontinued Medications   No medications on file    Allergies: Allergies  Allergen Reactions   Tigecycline Diarrhea and Nausea And Vomiting   Codeine  Nausea And Vomiting and Other (See Comments)    headache    Past Medical History: Past Medical History:  Diagnosis Date   Allergies 1995   Per patient report 11/14/18   Anemia    prior to finding colon cancer, no longer any issues with this per patient   Breast cancer [facility])    Breast cancer, right breast ([place]) 09/2017   S/P mastectomy 12/05/2017   Colon cancer ([place])    COPD (chronic obstructive pulmonary disease) ([place])    pt reports previous MD diagnosed her with this, but takes no medications   Dyspnea    occasionally with exertion   Family history of breast cancer    Headache 1968   per patient report, started as a teenager   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   Osteopenia 03/2018   Seen on DEXA Scan   PONV (postoperative nausea and vomiting)     Social History: Social History   Socioeconomic History   Marital status: Married    Spouse name: Not on file   Number of children: 4   Years of education: Not on file   Highest education level: Not on file  Occupational  History   Not on file  Tobacco Use   Smoking status: Never   Smokeless tobacco: Never  Vaping Use   Vaping Use: Never used  Substance and Sexual Activity   Alcohol use: Yes    Alcohol/week: 14.0 standard drinks of alcohol    Types: 14 Glasses of wine per week    Comment: 2-3 3oz glasses of wine per day   Drug use: Never   Sexual activity: Not on file  Other Topics Concern   Not on file  Social History Narrative   12-19-17 Unable to ask abuse questions husband with her today.   Social Determinants of Health   Financial Resource Strain: Not on file  Food Insecurity: Not on file  Transportation Needs: No Transportation Needs (05/03/2018)   PRAPARE - Hydrologist (Medical): No    Lack of Transportation (Non-Medical): No  Physical Activity: Not on file  Stress:  Not on file  Social Connections: Not on file     Assessment: Sonia Sandoval is here today to see Dr. Gale Journey and follow up for her NTM pulmonary infection. She was previously taking IV amikacin, IV tigecycline, azithromycin PO, and clofazimine PO. Tigecycline was making her extremely sick so she elected to stop last week and give herself an antibiotic holiday. She comes in today to discuss next steps.   She feels much better after a week off of antibiotics and is ready to restart. She has 100% coverage for Nuzyra IV so we will proceed with that. Her regimen will be:  Amikacin 1200  mg IV three times weekly (MWF) Nuzyra 200 mg IV x 1 then 100 mg IV daily  Azithromycin 250 mg PO daily Clofazimine 100 mg PO daily  I will call her next week to see how she is doing.   Plan: - Restart antibiotics as above - F/u with Dr. Gale Journey in 4 weeks  [place]idence Higashi L. Maylynn Orzechowski, PharmD, BCIDP, AAHIVP, CPP Clinical Pharmacist Practitioner Infectious Diseases [facility] 02/08/2022, 4:17 PM

## 2022-02-08 NOTE — Patient Instructions (Signed)
Let's start meds as below  Iv amikacin and omadocycline  Oral azithromycin and clofazimine    See [place] in 4 weeks

## 2022-02-10 NOTE — Progress Notes (Signed)
Sonia Sandoval   Telephone:([phone]) [phone] Fax:(336) [phone]   Clinic Follow up Note   Patient Care Team: [name], [place] as PCP - General (Nurse Practitioner) [name], MD as Consulting Physician (General Surgery) Magrinat, [name], MD (Inactive) as Consulting Physician (Oncology) [name], MD as Consulting Physician (Radiation Oncology) [name], RN as Registered Nurse [name], MD as Referring Physician (Dermatology) [name], RN (Inactive) as Oncology Nurse Navigator [name], MD as Consulting Physician (Hematology) [name], MD as Consulting Physician (General Surgery) [name], MD as Consulting Physician (Hematology) Kristeen Miss, MD as Consulting Physician (Neurosurgery)  Date of Service:  02/10/2022  I connected with Sonia Sandoval on 02/10/2022 at  11:30 AM EDT by telephone visit and verified that I am speaking with the correct person using two identifiers.  I discussed the limitations, risks, security and privacy concerns of performing an evaluation and management service by telephone and the availability of in person appointments. I also discussed with the patient that there may be a patient responsible charge related to this service. The patient expressed understanding and agreed to proceed.   Other persons participating in the visit and their role in the encounter:  none  Patient's location:  home Provider's location:  my office  CHIEF COMPLAINT: f/u of colon cancer, h/o breast cancer  CURRENT THERAPY:  Surveillance  ASSESSMENT & PLAN:  Sonia Sandoval is a 71 y.o. female with   1. Lung nodules with Mycobacterium abscessus infection  -nodules initially seen on staging CT for colon cancer 08/2019, developed new RLL consolidation on CT 04/2020 concerning for inflammation. PET scan 07/01/20 showed hypermetabolism to the now 3.3 cm RLL mass. 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 She was  referred to ID and started on IV and oral antibiotics -most recent angio chest CT 12/07/21 showed partial response to treatment with slight decrease in size. -she continues IV antibiotics per ID   2. Colon Cancer, pT3N0M0, stage II -diagnosed in 08/2019 by colonoscopy for anemia. 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 Margins and lymph nodes negative. -Adjuvant chemotherapy was not recommended -GuardianReveal in 04/2020 was negative. Repeat not performed due to insurance. -surveillance CT CAP 08/06/21 showed NED.   3. H/o Right Breast Cancer, G3, T2N1, ER+/PR+/HER-, Osteopenia, arthralgia -diagnosed in 09/2017. S/p B/l mastectomies by Dr Brantley Stage and adjuvant radiation by Dr Lisbeth Renshaw -She started on Anastrozole in 04/2018, switched to exemestane in 08/2020 due to concerns with joint pain. Ultimately discontinued 07/2021 due to worsening joint pain from osteoarthritis.  -We discussed switching to tamoxifen, patient declined due to concern for side effects.  Her arthralgia did not improve after she came off exemestane, she is willing to restart exemestane.  4. Osteoarthritis -worsened with AI but did not improve after stopping. -has completed PT, may need hip replacement.     PLAN:  -I spoke with Dr. Gale Journey, he has no concerns about restarting Exemestane, so will let pt restart exemestane, she declined tamoxifen  -lab and f/u in 3 months   No problem-specific Assessment & Plan notes found for this encounter.   SUMMARY OF ONCOLOGIC HISTORY: Oncology History Overview Note  Cancer Staging Cancer of sigmoid colon [facility]) Staging form: Colon and Rectum, AJCC 8th Edition - Pathologic stage from 11/22/2019: Stage IIA (pT3, pN0, cM0) - Signed by [name], MD on 12/17/2019  Malignant neoplasm  of lower-outer quadrant of right breast of female, estrogen receptor positive  ([place]) Staging form: Breast, AJCC 8th Edition - Clinical: Stage IIB (cT2, cN1, cM0, G3, ER+, PR+, HER2-) - Unsigned - Pathologic: Stage IIA (pT2, pN1(sn), cM0, G3, ER+, PR+, HER2-) - Unsigned    Malignant neoplasm of lower-outer quadrant of right breast of female, estrogen receptor positive ([place])  10/23/2017 Initial Diagnosis    right breast lower outer quadrant biopsy 10/23/2017 for a clinical T2 N1, stage IIB invasive ductal carcinoma, grade 3, estrogen and progesterone receptor positive, HER-2 not amplified, with an MIB-1 of 20%   11/07/2017 Miscellaneous   Mammaprint on was read as low risk, predicting no significant chemotherapy benefit with a 5-year distant disease-free survival in the 97-98% range with hormone therapy alone   12/05/2017 Surgery   status post bilateral mastectomies on 12/05/2017 showing             (a) on the left, no evidence of malignancy             (b) on the right, a pT2 pN1, stage IIB invasive ductal carcinoma, grade 3, with negative margins. By Dr Brantley Stage   01/15/2018 - 02/28/2018 Radiation Therapy   adjuvant radiation 01/15/2018 - 02/28/2018 with Dr Lisbeth Renshaw              Site/dose: The patient initially received a dose of 50.4 Gy in 28 fractions to the chest wall and supraclavicular region. This was delivered using a 3-D conformal, 4 field technique. The patient then received a boost to the mastectomy scar. This delivered an additional 10 Gy in 5 fractions using an en face electron field. The total dose was 60.4 Gy.    04/01/2018 -  Anti-estrogen oral therapy   started anastrozole in 04/2018. Switched to Exemestane in 08/2020 due to joint pain.             (a) bone density 03/07/2018 with a T score of -1.8. Her 04/21/20 DEXA showed osteopenia -1.8 at right total femur, with low risk for fracture.   10/28/2019 Genetic Testing   Negative genetic testing.  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  The Common Hereditary Gene Panel offered by  [facility] includes sequencing and/or deletion duplication testing of the following 48 genes: APC, ATM, AXIN2, BARD1, BMPR1A, BRCA1, BRCA2, BRIP1, CDH1, CDK4, CDKN2A (p14ARF), CDKN2A (p16INK4a), CHEK2, CTNNA1, DICER1, EPCAM (Deletion/duplication testing only), GREM1 (promoter region deletion/duplication testing only), KIT, MEN1, MLH1, MSH2, MSH3, MSH6, MUTYH, NBN, NF1, NHTL1, PALB2, PDGFRA, PMS2, POLD1, POLE, PTEN, RAD50, RAD51C, RAD51D, RNF43, SDHB, SDHC, SDHD, SMAD4, SMARCA4. STK11, TP53, TSC1, TSC2, and VHL.  The following genes were evaluated for sequence changes only: SDHA and HOXB13 c.251G>A variant only. The report date is October 28, 2019.   03/08/2021 Imaging   CT Chest w/o contrast  IMPRESSION: 1. The previous FDG avid lung mass within the anterior right lower lobe has decreased in size compared with previous exam compatible with response to therapy. The cavitary lung nodule within the right apex is slightly increased in size from previous exam. Additionally, there is a new irregular nodule within the medial right upper lobe measuring 6 mm. Previously 3 mm. 2. Interval increase in size of cavitary nodule within the right lung apex. This measures 2.3 x 1.5 cm, compared with 1.3 x 0.9 cm previously. Underlying malignancy cannot be excluded. 3. New small nodule within the right upper lobe measures 6 mm. This has a nonspecific appearance but warrants attention on follow-up imaging.  4. The remaining small pulmonary nodules noted previously are stable in the interval. 5. Coronary artery calcifications noted. 6. Aortic Atherosclerosis (ICD10-I70.0).   Cancer of sigmoid colon ([place])  09/23/2019 Tumor Marker   Baseline CEA 1.4 on 09/23/19    09/23/2019 Procedure   Colonoscopy and Upper endoscopy with Dr Alessandra Bevels 09/23/19  Upper Endoscopy Impression - Z-line regular, 35 cm from the incisors. - Small hiatal hernia. - Non-bleeding gastric ulcer. Biopsied. - Hematin (altered blood/coffee-ground-like  material) in the gastric body. - Normal duodenal bulb, first portion of the duodenum and second portion of - Normal duodenal bulb, first portion of the duodenum and second portion of the duodenum.  Colonoscopy Impression - Preparation of the colon was poor. - Perianal skin tags found on perianal exam. - Likely malignant tumor in the recto-sigmoid colon. Biopsied. Tattooed. - Internal hemorrhoids.   09/23/2019 Initial Biopsy   FINAL MICROSCOPIC DIAGNOSIS: 09/23/19 A. STOMACH, ULCER, BIOPSY:  - Gastric antral mucosa with mild nonspecific reactive gastropathy  - Warthin Starry stain is negative for Helicobacter pylori   B. COLON, RECTOSIGMOID, MASS, BIOPSY:  - Adenocarcinoma, see comment  COMMENT:   B.  Immunohistochemical stains for MMR-related proteins are pending and  will be reported in an addendum. Dr. Saralyn Pilar reviewed the case and  concurs with the diagnosis. Dr. Alessandra Bevels was notified on 09/24/2019.     ADDENDUM:  Mismatch Repair Protein (IHC)  SUMMARY INTERPRETATION: NORMAL  There is preserved expression of the major MMR proteins. There is a very  low probability that microsatellite instability (MSI) is present.  However, certain clinically significant MMR protein mutations may result  in preservation of nuclear expression. It is recommended that the  preservation of protein expression be correlated with molecular based  MSI testing.   IHC EXPRESSION RESULTS  TEST           RESULT  MLH1:          Preserved nuclear expression  MSH2:          Preserved nuclear expression  MSH6:          Preserved nuclear expression  PMS2:          Preserved nuclear expression    09/23/2019 Imaging   CT CAP W Contrast 09/23/19  IMPRESSION: 1. There is eccentric wall thickening of the rectosigmoid colon, in keeping with mass identified and biopsied by colonoscopy.   2. There are prominent subcentimeter lymph nodes in the sigmoid mesocolon, suspicious for nodal metastatic disease. There  is no other lymphadenopathy in the abdomen or pelvis.   3. There are multiple small bilateral pulmonary nodules measuring up to 4 mm, nonspecific although very suspicious for pulmonary metastatic disease. Given history of breast malignancy, previous imaging of the chest would be very helpful to assess for stability. This report may be addended for comparison if prior imaging can be obtained.   4. Status post bilateral mastectomy with irregular subpleural opacity of the anterior right upper lobe, consistent with history of breast malignancy and radiation fibrosis.   5. There is adjacent nodularity of the right pulmonary apex, the largest discrete nodular component measuring up to 1.1 cm. This is more favored infectious or inflammatory although nonspecific and metastatic disease is not excluded. As as above, prior imaging would be very helpful to assess.   6. Small focus of nonspecific infectious or inflammatory airspace disease of the perihilar left upper lobe.   09/26/2019 Initial Diagnosis   Cancer of sigmoid colon ([place])   10/28/2019 PET scan  IMPRESSION: 1. Increased tracer uptake within the pelvis corresponding to wall thickening of the rectosigmoid colon favored to represent primary colorectal carcinoma. No findings of FDG avid nodal metastasis or solid organ metastasis within the abdomen or pelvis. 2. Within the right lung apex there is a cluster of 3 subpleural nodular densities which exhibit varying degrees of mild FDG uptake with SUV max between 1.9 and 3.1. Morphologically the appearance is suggestive of benign postinflammatory pleuroparenchymal scarring. This would be an atypical pattern of metastatic disease, which is considered less favored, but not excluded. Attention in this area on serial follow-up imaging is advised. 3. Two small, less than 5 mm left lower lobe lung nodules are too small to reliably characterize. 4.  Aortic Atherosclerosis (ICD10-I70.0). 5.  Nonobstructing right renal calculus.     11/22/2019 Cancer Staging   Staging form: Colon and Rectum, AJCC 8th Edition - Pathologic stage from 11/22/2019: Stage IIA (pT3, pN0, cM0) - Signed by [name], MD on 12/17/2019   11/22/2019 Surgery   XI ROBOT ASSISTED SIGMOIDECTOMY and FLEXIBLE SIGMOIDOSCOPY by Dr white and Dr Marcello Moores   11/22/2019 Pathology Results   FINAL MICROSCOPIC DIAGNOSIS:   A. SIGMOID COLON, RESECTION:  - Invasive moderately differentiated adenocarcinoma, 4.5 cm, involving distal sigmoid colon  - Carcinoma focally invades into pericolonic soft tissue  - Resection margins are negative for carcinoma  - Negative for lymphovascular or perineural invasion  - Eighteen benign lymph nodes (0/18)  - See oncology table   B. FINAL DISTAL MARGIN, EXCISION:  - Colonic donut, negative for carcinoma    04/15/2020 Imaging   CT Chest  IMPRESSION: 1. New nodular 1.9 cm focus of consolidation in the anterior right lower lobe with surrounding mild patchy tree-in-bud opacity, nonspecific but favoring infectious/inflammatory etiology. 2. Clustered nodularity in the apical right upper lobe is stable in size and newly cavitary. These findings are indeterminate for atypical infection such as MAI versus response to therapy of pulmonary metastases. Additional scattered tiny left pulmonary nodules are stable. Suggest close chest CT follow-up in 3 months. 3. No thoracic adenopathy. 4. Stable dilated main pulmonary artery, suggesting chronic pulmonary arterial hypertension. 5. Aortic Atherosclerosis (ICD10-I70.0).   04/17/2020 Tumor Marker   Her Guardant Reveal showed negative - ctDNA not detected.    07/08/2020 Imaging   CT Chest  IMPRESSION: Slight reduction in size of hypermetabolic right upper lobe pulmonary nodule, nonspecific though favored to represent area of infection/inflammation as was initially suggested on chest CT performed 04/15/2020. No biopsy attempted at this time.    03/08/2021 Imaging   CT Chest w/o contrast  IMPRESSION: 1. The previous FDG avid lung mass within the anterior right lower lobe has decreased in size compared with previous exam compatible with response to therapy. The cavitary lung nodule within the right apex is slightly increased in size from previous exam. Additionally, there is a new irregular nodule within the medial right upper lobe measuring 6 mm. Previously 3 mm. 2. Interval increase in size of cavitary nodule within the right lung apex. This measures 2.3 x 1.5 cm, compared with 1.3 x 0.9 cm previously. Underlying malignancy cannot be excluded. 3. New small nodule within the right upper lobe measures 6 mm. This has a nonspecific appearance but warrants attention on follow-up imaging. 4. The remaining small pulmonary nodules noted previously are stable in the interval. 5. Coronary artery calcifications noted. 6. Aortic Atherosclerosis (ICD10-I70.0).      INTERVAL HISTORY:  Sonia Sandoval was contacted for a follow up of  her breast cancer and colon cancer.  She verified her identities.  She is clinically doing well overall, she has been following ID Dr. Gale Journey and plan to start a new regiment of antibiotics.  She tolerated previous one poorly.  She has been off exemestane since April 2023, her arthralgia has not improved.  She has seen orthopedic surgeon, and hip replacement was recommended.  She has not been able to schedule surgery due to her lung infections.   All other systems were reviewed with the patient and are negative.  MEDICAL HISTORY:  Past Medical History:  Diagnosis Date   Allergies 1995   Per patient report 11/14/18   Anemia    prior to finding colon cancer, no longer any issues with this per patient   Breast cancer [facility])    Breast cancer, right breast ([place]) 09/2017   S/P mastectomy 12/05/2017   Colon cancer ([place])    COPD (chronic obstructive pulmonary disease) ([place])    pt reports previous MD diagnosed her with this,  but takes no medications   Dyspnea    occasionally with exertion   Family history of breast cancer    Headache 1968   per patient report, started as a teenager   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   Osteopenia 03/2018   Seen on DEXA Scan   PONV (postoperative nausea and vomiting)     SURGICAL HISTORY: Past Surgical History:  Procedure Laterality Date   BACK SURGERY  2003   Patient will occassional back pain since 2003   BIOPSY  09/23/2019   Procedure: BIOPSY;  Surgeon: Otis Brace, MD;  Location: [facility];  Service: Gastroenterology;;  EGD and COLON   BREAST BIOPSY Right 09/2017   BRONCHIAL BIOPSY  08/31/2021   Procedure: BRONCHIAL BIOPSIES;  Surgeon: Garner Nash, DO;  Location: [place];  Service: Pulmonary;;   BRONCHIAL BRUSHINGS  08/31/2021   Procedure: BRONCHIAL BRUSHINGS;  Surgeon: Garner Nash, DO;  Location: [place];  Service: Pulmonary;;   BRONCHIAL NEEDLE ASPIRATION BIOPSY  08/31/2021   Procedure: BRONCHIAL NEEDLE ASPIRATION BIOPSIES;  Surgeon: Garner Nash, DO;  Location: [place];  Service: Pulmonary;;   BRONCHIAL WASHINGS  08/31/2021   Procedure: BRONCHIAL WASHINGS;  Surgeon: Garner Nash, DO;  Location: [place];  Service: Pulmonary;;   CESAREAN SECTION  1985; 1992   COLONOSCOPY WITH PROPOFOL N/A 09/23/2019   Procedure: COLONOSCOPY WITH PROPOFOL and EGD;  Surgeon: Otis Brace, MD;  Location: [facility];  Service: Gastroenterology;  Laterality: N/A;   DILATION AND CURETTAGE OF UTERUS     ESOPHAGOGASTRODUODENOSCOPY N/A 09/23/2019   Procedure: ESOPHAGOGASTRODUODENOSCOPY (EGD);  Surgeon: Otis Brace, MD;  Location: [facility];  Service: Gastroenterology;  Laterality: N/A;   FLEXIBLE SIGMOIDOSCOPY N/A 11/22/2019   Procedure: FLEXIBLE SIGMOIDOSCOPY;  Surgeon: [name], MD;  Location: [facility];  Service:  General;  Laterality: N/A;   LUMBAR [place] SURGERY  2003   MASTECTOMY Left 12/05/2017   PROPHYLACTIC MASTECTOMY   MASTECTOMY COMPLETE / SIMPLE W/ SENTINEL NODE BIOPSY Right 12/05/2017   WITH RADIOACTIVE SEED TARGETED RIGHT AXIILARY LYMPH NODE EXCISION AND RIGHT SENTINEL LYMPH NODE BIOPSY,    MASTECTOMY WITH RADIOACTIVE SEED GUIDED EXCISION AND AXILLARY SENTINEL LYMPH NODE BIOPSY Bilateral 12/05/2017   Procedure: RIGHT SIMPLE MASTECTOMY WITH RADIOACTIVE SEED TARGETED RIGHT AXIILARY LYMPH NODE EXCISION AND RIGHT SENTINEL LYMPH NODE BIOPSY, LEFT PROPHYLACTIC MASTECTOMY;  Surgeon: [name], MD;  Location: [place];  Service: General;  Laterality: Bilateral;   SUBMUCOSAL TATTOO INJECTION  09/23/2019   Procedure: SUBMUCOSAL TATTOO INJECTION;  Surgeon: Otis Brace, MD;  Location: [facility];  Service: Gastroenterology;;   VIDEO BRONCHOSCOPY WITH RADIAL ENDOBRONCHIAL ULTRASOUND  08/31/2021   Procedure: RADIAL ENDOBRONCHIAL ULTRASOUND;  Surgeon: Garner Nash, DO;  Location: [place] [facility];  Service: Pulmonary;;    I have reviewed the social history and family history with the patient and they are unchanged from previous note.  ALLERGIES:  is allergic to tigecycline and codeine.  MEDICATIONS:  Current Outpatient Medications  Medication Sig Dispense Refill   albuterol (VENTOLIN HFA) 108 (90 Base) MCG/ACT inhaler Inhale 2 puffs into the lungs every 6 (six) hours as needed for wheezing or shortness of breath. 8 g 2   Calcium Carbonate-Vitamin D (CALCIUM 600+D PO) Take 1 tablet by mouth daily.     Cholecalciferol (VITAMIN D) 50 MCG (2000 UT) tablet Take 2,000 Units by mouth daily.     gabapentin (NEURONTIN) 300 MG capsule Take 300 mg by mouth daily.     OVER THE COUNTER MEDICATION Take 1 tablet by mouth daily. Juice plus fruit blend     OVER THE COUNTER MEDICATION Take 1 capsule by mouth daily. Juice plus berry blend     OVER THE COUNTER MEDICATION Take 1 capsule by mouth daily. Juice plus veggie  blend     pantoprazole (PROTONIX) 40 MG tablet Take 40 mg by mouth every morning.     Probiotic Product (PROBIOTIC PO) Take 2 capsules by mouth daily.     tiZANidine (ZANAFLEX) 4 MG tablet Take 4 mg by mouth daily as needed for muscle spasms.     vitamin E 180 MG (400 UNITS) capsule Take 800 Units by mouth daily.     No current facility-administered medications for this visit.    PHYSICAL EXAMINATION: ECOG PERFORMANCE STATUS: 1 - Symptomatic but completely ambulatory  There were no vitals filed for this visit. Wt Readings from Last 3 Encounters:  02/08/22 145 lb (65.8 kg)  01/21/22 145 lb (65.8 kg)  12/29/21 145 lb 12.8 oz (66.1 kg)     No vitals taken today, Exam not performed today  LABORATORY DATA:  I have reviewed the data as listed    Latest Ref Rng & Units 12/06/2021    8:23 PM 11/24/2021   12:50 PM 08/06/2021   10:00 AM  CBC  WBC 4.0 - 10.5 K/uL 7.1  5.5  5.3   Hemoglobin 12.0 - 15.0 g/dL 12.3  12.0  12.3   Hematocrit 36.0 - 46.0 % 37.1  35.8  37.3   Platelets 150 - 400 K/uL 321  219  281         Latest Ref Rng & Units 12/06/2021    8:23 PM 11/24/2021   12:50 PM 08/06/2021   10:00 AM  CMP  Glucose 70 - 99 mg/dL 112  92  89   BUN 8 - 23 mg/dL '19  16  15   ' Creatinine 0.44 - 1.00 mg/dL 0.97  0.86  0.75   Sodium 135 - 145 mmol/L 138  137  138   Potassium 3.5 - 5.1 mmol/L 4.4  4.1  4.0   Chloride 98 - 111 mmol/L 105  102  102   CO2 22 - 32 mmol/L '24  24  29   ' Calcium 8.9 - 10.3 mg/dL 9.2  10.0  9.3   Total Protein 6.5 - 8.1 g/dL  7.0  6.4  7.1   Total Bilirubin 0.3 - 1.2 mg/dL 0.3  0.4  0.4   Alkaline Phos 38 - 126 U/L 137  73  96   AST 15 - 41 U/L 60  34  24   ALT 0 - 44 U/L 148  35  25       RADIOGRAPHIC STUDIES: I have personally reviewed the radiological images as listed and agreed with the findings in the report. No results found.    No orders of the defined types were placed in this encounter.  All questions were answered. The patient knows to call the  clinic with any problems, questions or concerns. No barriers to learning was detected. The total time spent in the appointment was 25 minutes.     [name], MD 02/10/2022   I, Wilburn Mylar, am acting as scribe for [name], MD.   I have reviewed the above documentation for accuracy and completeness, and I agree with the above.

## 2022-02-11 ENCOUNTER — Inpatient Hospital Stay: Payer: Commercial Managed Care - PPO | Attending: Hematology | Admitting: Hematology

## 2022-02-11 ENCOUNTER — Telehealth: Payer: Self-pay

## 2022-02-11 DIAGNOSIS — C50511 Malignant neoplasm of lower-outer quadrant of right female breast: Secondary | ICD-10-CM

## 2022-02-11 DIAGNOSIS — C187 Malignant neoplasm of sigmoid colon: Secondary | ICD-10-CM | POA: Diagnosis not present

## 2022-02-11 DIAGNOSIS — Z170 Estrogen receptor positive status [ER+]: Secondary | ICD-10-CM | POA: Diagnosis not present

## 2022-02-11 NOTE — Telephone Encounter (Signed)
Spoke with pt via telephone to let pt know that Dr. Burr Medico checked with our pharmacist regarding her question of if her Exemestane will have any interaction with the abx that her other provider has prescribed for her to take.  Informed pt that our pharmacist have checked all the abx the pt had questioned/been prescribed and neither of them will interact with the pt's Exemestane.  Pt was glad to hear the news and had no further questions at this time.

## 2022-02-14 ENCOUNTER — Other Ambulatory Visit: Payer: Self-pay | Admitting: Hematology

## 2022-02-14 ENCOUNTER — Telehealth: Payer: Self-pay | Admitting: Hematology

## 2022-02-14 ENCOUNTER — Other Ambulatory Visit: Payer: Self-pay

## 2022-02-14 MED ORDER — EXEMESTANE 25 MG PO TABS: 25.0000 mg | ORAL_TABLET | Freq: Every day | ORAL | 0 refills | Status: DC

## 2022-02-14 NOTE — Telephone Encounter (Signed)
Spoke with patient confirming 1/17 appointment  

## 2022-02-16 LAB — MYCOBACTERIA,CULT W/FLUOROCHROME SMEAR
MICRO NUMBER:: 13866111
SMEAR:: NONE SEEN
SPECIMEN QUALITY:: ADEQUATE

## 2022-02-17 ENCOUNTER — Other Ambulatory Visit: Payer: Self-pay | Admitting: Pharmacist

## 2022-02-19 LAB — MYCOBACTERIA,CULT W/FLUOROCHROME SMEAR
MICRO NUMBER:: 13888454
SMEAR:: NONE SEEN
SPECIMEN QUALITY:: ADEQUATE

## 2022-02-24 ENCOUNTER — Encounter: Payer: Self-pay | Admitting: Pharmacist

## 2022-02-25 ENCOUNTER — Other Ambulatory Visit: Payer: Self-pay

## 2022-02-25 LAB — MYCOBACTERIA,CULT W/FLUOROCHROME SMEAR
MICRO NUMBER:: 13902961
SMEAR:: NONE SEEN
SPECIMEN QUALITY:: ADEQUATE

## 2022-02-28 ENCOUNTER — Telehealth: Payer: Self-pay | Admitting: Pharmacist

## 2022-02-28 NOTE — Telephone Encounter (Signed)
Patient picked up 2 bottles of clofazimine on 02/25/22. Supply should last for ~3 months. Next refill due around mid to late January.  Jeaneane Adamec L. Eber Hong, PharmD, BCIDP, AAHIVP, CPP Clinical Pharmacist Practitioner Infectious Diseases [place] [facility] 02/28/2022, 10:57 AM

## 2022-03-14 ENCOUNTER — Encounter: Payer: Self-pay | Admitting: Pharmacist

## 2022-03-16 ENCOUNTER — Encounter: Payer: Self-pay | Admitting: Hematology

## 2022-03-16 ENCOUNTER — Ambulatory Visit (INDEPENDENT_AMBULATORY_CARE_PROVIDER_SITE_OTHER): Payer: Commercial Managed Care - PPO | Admitting: Internal Medicine

## 2022-03-16 ENCOUNTER — Encounter: Payer: Self-pay | Admitting: Internal Medicine

## 2022-03-16 ENCOUNTER — Telehealth: Payer: Self-pay

## 2022-03-16 ENCOUNTER — Other Ambulatory Visit: Payer: Self-pay

## 2022-03-16 VITALS — BP 142/81 | HR 76 | Temp 98.0°F | Ht 63.0 in | Wt 148.0 lb

## 2022-03-16 DIAGNOSIS — J181 Lobar pneumonia, unspecified organism: Secondary | ICD-10-CM | POA: Diagnosis not present

## 2022-03-16 DIAGNOSIS — A318 Other mycobacterial infections: Secondary | ICD-10-CM

## 2022-03-16 NOTE — Telephone Encounter (Signed)
Sent orders to Carolynn Sayers, RN with [facility] to leave PICC in place until notified by our office to remove per Dr. Gale Journey.   Beryle Flock, RN

## 2022-03-16 NOTE — Progress Notes (Signed)
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 Was supposed to be seen several months prior but didn't show.    5/02 underwent bronch: "The distinct navigation pathways prepared prior to this procedure were then utilized to navigate to patient's lesion identified on CT scan. The robotic catheter was secured into place and the vision probe was withdrawn.  Lesion location was approximated using fluoroscopy and radial endobronchial ultrasound for peripheral targeting. Under fluoroscopic guidance transbronchial needle brushings, transbronchial needle biopsies, and transbronchial forceps biopsies were performed to be sent for cytology and pathology. A bronchioalveolar lavage was performed in the right upper lobe and sent for microbiology.  Additional  transbronchial biopsies sent for tissue culture.   At the end of the procedure a general airway inspection was performed and there was no evidence of active bleeding. The bronchoscope was removed.  The patient tolerated the procedure well. There was no significant blood loss and there were no obvious complications. A post-procedural chest x-ray is pending."  Cytology didn't see cancer cells  On micro -- something is growing that looks like ntm and the [place] technology calls it abscessus but it was sent on 5/8th to reference lab to identify   Patient without respiratory sx outside of stable mild dyspnea on exertion 4-5 months. She would encounter this walking up a hill No b sx Has watery eyes and a recent dry cough but query if it's allergy No reflux No decreased appetite No weight loss   11/04/21 id clinic f/u Patient is back for f/u post susceptibility testing which show azithromycin/amikacin sensitive m abscessus She continues to feel well without b sx, cough, dyspnea, chest pain She is accompanied by her significant others today  12/28/21 id clinic f/u Patient developed itchy rash about 10 days ago involving trunk/ext. Takes benadryl and topical hydrocortisone. 2 benadryls to help sleep. Also takes claritin. Rash is getting worse Opat labs elevated lft but improving (moderate in 100s), without eosinophilia Amikacin trough 8/24 is 0.8 with creatinine 0.98 No fever chill Occasional cough Went to ed 8/08 for sob after extreme exertion and chest ct was done -- no pe. Improved cavitation.  No joint pain/muscle aching No new vision change; she endorses having cataracts Has chronic leg cramps which she has been using tonic water for 6 months. Question of some tingling in hands/feet very mild  At some point she has recurrent elevation ak trough and cr and we had planned to substitute amikacin with linezolid but she done well as above with AK....  01/21/22 id clinic f/u Cefoxitin was hold  first week of 12/2021 Her rash is much better She does at one point had yeast vaginal infection and thrush about 2 weeks ago (she got fluconazole for vaginal yeast dose and some other topical gel otc for her mouth). She still feel some taste change on her tongue/feels raw as well with hot temperature especially No visual changes No numbness/tingling hands feet Current regimen: Amikacin Azith Clofazimine Linezolid (Doxy) Waiting for omadocycline approval again from insurance No n/v/diarrhea Rare cough Stable short of breath  She takes b6 daily for a while now  She had submitted 3 afb sputum but said the sputum are mostly upper oral. She can't get sputum up   02/08/22 id clinic f/u Patient last took abx a week ago. She was on tigycline at that time and had severe intolerable nausea/vomiting The doxycycline is out of the question is it should never be used to m-abscessus  She is ready to start again  03/16/22 id clinic f/u Patient started on amikacin, omado, clofaz, and azithromycin within a few days of 02/08/22 visit She is doing fine on those without any side effect Cough minimal/stable No b sx No weight loss -- weight gain actually No dyspnea Will have body ct to f/u breast cancer this December Can't get sputum up   Review of Systems: ROS All other ros negative      Past Medical History:  Diagnosis Date   Allergies 1995   Per patient report 11/14/18   Anemia    prior to finding colon cancer, no longer any issues with this per patient   Breast cancer [facility])    Breast cancer, right breast ([place]) 09/2017   S/P mastectomy 12/05/2017   Colon cancer ([place])    COPD (chronic obstructive pulmonary disease) ([place])    pt reports previous MD diagnosed her with this, but takes no medications   Dyspnea    occasionally with exertion   Family history of breast cancer    Headache 1968   per patient report, started as a teenager   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   Osteopenia 03/2018   Seen on DEXA Scan   PONV (postoperative nausea and vomiting)     Social History   Tobacco Use   Smoking status: Never   Smokeless tobacco: Never  Vaping Use   Vaping Use: Never used  Substance Use Topics   Alcohol use: Yes    Alcohol/week: 14.0 standard drinks of alcohol    Types: 14 Glasses of wine per week    Comment: 2-3 3oz glasses of wine per day   Drug use: Never    Family History  Problem Relation Age of Onset   Dementia Mother    Lung cancer Father        smoked   Emphysema Father    Heart disease Maternal Grandfather    Cancer Maternal Aunt 60       breast cancer    Cancer Cousin        maternal first cousin with breast cancer in her 56s    Allergies  Allergen Reactions   Tigecycline Diarrhea and Nausea And Vomiting   Codeine Nausea And Vomiting  and Other (See Comments)    headache    OBJECTIVE: Vitals:   03/16/22 1419  BP: (!) 142/81  Pulse: 76  Temp: 98 F (36.7 C)  TempSrc: Oral  SpO2: 100%  Weight: 148 lb (67.1 kg)  Height: _0  (1.6 m)   Body mass index is 26.22 kg/m.   Physical Exam General/constitutional: no distress, pleasant HEENT: Normocephalic, PER, Conj Clear, EOMI, Oropharynx clear Neck supple CV: rrr no mrg Lungs: clear to auscultation, normal respiratory effort Abd: Soft, Nontender Ext: no edema Skin: No Rash Neuro: nonfocal MSK: no peripheral joint swelling/tenderness/warmth; back spines nontender       Lab: Reviewed opat labs for 03/2022 visit   Lab Results  Component Value Date   WBC 7.1 12/06/2021   HGB 12.3 12/06/2021   HCT 37.1 12/06/2021   MCV 88.1 12/06/2021   PLT 321 29/51/8841   Last metabolic panel Lab Results  Component Value Date   GLUCOSE 112 (H) 12/06/2021   NA 138 12/06/2021   K 4.4 12/06/2021   CL 105 12/06/2021   CO2 24 12/06/2021   BUN 19 12/06/2021    CREATININE 0.97 12/06/2021   GFRNONAA >60 12/06/2021   CALCIUM 9.2 12/06/2021   PHOS 3.6 11/14/2018   PROT 7.0 12/06/2021   ALBUMIN 4.2 12/06/2021   BILITOT 0.3 12/06/2021   ALKPHOS 137 (H) 12/06/2021   AST 60 (H) 12/06/2021   ALT 148 (H) 12/06/2021   ANIONGAP 9 12/06/2021    Microbiology:  Serology:  Imaging: I have reviewed the imaging personally and incorporated into decision making  08/06/21 chest abd pelv ct with contrast FINDINGS: CT CHEST FINDINGS   Cardiovascular: Aortic atherosclerosis. Normal heart size. No pericardial effusion.   Mediastinum/Nodes: No enlarged mediastinal, hilar, or axillary lymph nodes. Thyroid gland, trachea, and esophagus demonstrate no significant findings.   Lungs/Pleura: Interval increase in wall thickening and cavitation within the right pulmonary apex, overall lesion now confluent and measuring 3.8 x 3.1 cm (series 4, image 24). Increased clustered centrilobular nodularity and small consolidations in this vicinity (series 4, image 37). Residual bandlike scarring in the bilateral lung bases (series 4, image 109, 122). Occasional small nodules of the left lower lobe measuring up to 0.5 cm (series 4, image 21). No pleural effusion or pneumothorax.   Musculoskeletal: No chest wall mass or suspicious osseous lesions identified. Status post bilateral mastectomy.   CT ABDOMEN PELVIS FINDINGS   Hepatobiliary: No solid liver abnormality is seen. No gallstones, gallbladder wall thickening, or biliary dilatation.   Pancreas: Unremarkable. No pancreatic ductal dilatation or surrounding inflammatory changes.   Spleen: Normal in size without significant abnormality.   Adrenals/Urinary Tract: Adrenal glands are unremarkable. Punctuate nonobstructive calculus in the inferior pole of the right kidney (series 5, image 89). No left-sided calculi, ureteral calculi, or hydronephrosis. Bladder is unremarkable.   Stomach/Bowel: Stomach is within  normal limits. Appendix is not clearly visualized and may be surgically absent. The cecum is anterior to the liver. Status post sigmoid colon resection and reanastomosis. No evidence of bowel wall thickening, distention, or inflammatory changes.   Vascular/Lymphatic: Aortic atherosclerosis. No enlarged abdominal or pelvic lymph nodes.   Reproductive: No mass or other abnormality.   Other: No abdominal wall hernia or abnormality. No ascites.   Musculoskeletal: No acute osseous findings.   IMPRESSION: 1. Status post sigmoid colon resection and reanastomosis. No evidence of lymphadenopathy or metastatic disease in the chest, abdomen, or pelvis. 2. Interval increase in wall thickening and cavitation within the right  pulmonary apex, overall lesion now confluent and measuring 3.8 x 3.1 cm. Increased clustered centrilobular nodularity and small consolidations in this vicinity. Findings are consistent with ongoing, worsened cavitary infection. 3. Additional small nodules of the left lower lobe measuring up to 0.5 cm, unchanged and almost certainly benign, incidental sequelae of infection inflammation. 4. Nonobstructive right nephrolithiasis.   12/07/21  chest cta 1. No pulmonary embolism. No acute intrathoracic pathology identified. 2. Slight interval decrease in size of a cavitary lesion within the right upper lobe in keeping with at least partial response to therapy. Repeat imaging in 6-12 months would be helpful in confirming stability or resolution exclude the possibility of an underlying cavitating malignancy 3. Stable 5 mm subpleural pulmonary nodule within the left lower lobe, safely considered benign.   Assessment/plan: Problem List Items Addressed This Visit   None Visit Diagnoses     Lobar pneumonia, unspecified organism ([place])    -  Primary   Relevant Medications   azithromycin (ZITHROMAX) 500 MG tablet   Mycobacterium abscessus infection       Relevant Medications    azithromycin (ZITHROMAX) 500 MG tablet     Abx: 10/10 - c iv amikacin, omado; po clofaz, azith   (9/7-10/03 doxy) 9/6-10/03 linezolid 7/6-10/03 amikacin 7/6-10/03 azith 7/6-10/03 clofazimine  7/6-9/07 cefoxitin    Discussed natural history, epidimiology, pathogenesis, diagnosis criteria and treatment indication for ntm lungs  Discussed resistant nature of abscessus, cure rate, relapse rate  At this time she is completely assymptomatic but imaging has indicated progression of cavitary lesion. On cytology there was no malignant cells. She had hx of colon cancer and breast cancer at least 3 years prior to this admission, and all are in remission  At this time culture is still pending final ID confirmation and susceptibility testing  I would treat her based on progression of ct scan  Will await susceptibility and follow up with her in around 4-6 weeks to decide on treatment regimen    7/6 assessment Doing well Due to cavitary progression will do 2 iv's and 2 po's  Initial regimen will be iv amikacin, cefoxitin and po clofazimine and azithromycin  The initial regimen will be at least 1-3 months as long as she can tolerate.  After the 3 months potentially we can keep 1 iv and the 2 PO's or transition her iv's to inhaled amikacin with the 2 po's  Our pharmacist team speaks with her today. Will start [place] and azith today  Once picc is in she can start the cefoxitin continuous daily infusion and tiw amikacin   Repeat chest ct in 6-8 weeks Repeat afb sputum x3 1 month after starting medication   8/29 assessment Ct improved on 8/8 Rash ?cefoxitin. No sign of DRESS or arthus reaction otherwise. Will look in to the newer tetracycline along with bedaquilline for her. Can continue as is for now her current drug regimen 3 sputum early morning ordered for afb testing -- discussed to do it before eating/drinking/brushing/gargling Repeat chest ct in 3-6 months   Discussed  with our id pharmacy team   ----------- 01/21/22 id assessment For some reason insurance is making it difficult in getting omadocycline approved. They want her to try doxycycline first  There is no evidence doxycycline works for [employer], and omadocycline or tigecycline would need to be the back bone of treatment (tigecycline of course much more adverse side effect).  Our pharmacist is trying to get this approved with insurance. Twice now, awaiting result  She  is on amikacin, azith, clofazimine, linezolid now along with doxy (as requested by the insurance).  Cefoxitin stopped due to rash thought to be related to it.  She is tolerating it  Follow up with me in 4-6 weeks  I will ask dr Valeta Harms about repeating a bronch in 3-6 months to get sputum again as she can't get good sputum by herself. I am not confident I can trust her expectorated sputum for sterility check  Will plan on repeat chest ct in another 3 months and see if dr Valeta Harms could help get some sputum in 3-6 months  02/08/22 id assessment Patient ready to start abx again. Couldn't tolerate tigecycline Iv omadocycline is covered by her insurance. They had several times unreasonably and illegally denied oral omadocycline  Will resume amikacin and omadocycline iv, with oral azithromycin and clofazimine  Plan 3 months iv then transition to oral hopefully by that time her sputum has sterilization of m-abscessus (so around 05/2022)  03/16/22 id assessment Patient to arrange body ct with her oncologist this December She'll be at 3 months of current regimen for m-abscessus by 05/2022 which I think she should have sputum assessment again I'll ask dr Valeta Harms to do bronchoscopy to get afb sputum culture at that time (around end of December or early 05/2022 F/u with me after 4-6 weeks after bronchoscopy done so we can decide if we need to drop one of the iv antibiotics   I have spent a total of 20 minutes of face-to-face and  non-face-to-face time, excluding clinical staff time, preparing to see patient, ordering tests and/or medications, and provide counseling the patient     Follow-up: Return in about 3 months (around 06/16/2022).  Jabier Mutton, [facility] 03/16/2022, 3:02 PM

## 2022-03-16 NOTE — Patient Instructions (Signed)
Please discuss with your oncologist to get chest ct as well December   I'll have dr Valeta Harms do bronchoscopy end of December or early January; see me 4-6 weeks after bronchoscopy   I am ok with  you doing hip replacement as long as it doesn't interfere with your lung infection medicaitons

## 2022-03-17 ENCOUNTER — Other Ambulatory Visit: Payer: Self-pay | Admitting: *Deleted

## 2022-03-17 ENCOUNTER — Encounter: Payer: Self-pay | Admitting: Hematology

## 2022-03-17 DIAGNOSIS — C50511 Malignant neoplasm of lower-outer quadrant of right female breast: Secondary | ICD-10-CM

## 2022-03-23 ENCOUNTER — Other Ambulatory Visit: Payer: Self-pay | Admitting: Pulmonary Disease

## 2022-04-04 ENCOUNTER — Ambulatory Visit (HOSPITAL_COMMUNITY)
Admission: RE | Admit: 2022-04-04 | Discharge: 2022-04-04 | Disposition: A | Discharge status: Home / Self Care | Payer: Commercial Managed Care - PPO | Source: Ambulatory Visit | Attending: Hematology | Admitting: Hematology

## 2022-04-04 ENCOUNTER — Ambulatory Visit: Payer: Commercial Managed Care - PPO

## 2022-04-04 ENCOUNTER — Encounter (HOSPITAL_COMMUNITY): Payer: Self-pay

## 2022-04-04 DIAGNOSIS — C50511 Malignant neoplasm of lower-outer quadrant of right female breast: Secondary | ICD-10-CM | POA: Diagnosis present

## 2022-04-04 DIAGNOSIS — Z170 Estrogen receptor positive status [ER+]: Secondary | ICD-10-CM | POA: Insufficient documentation

## 2022-04-12 NOTE — Assessment & Plan Note (Signed)
-  nodules initially seen on staging CT for colon cancer 08/2019, developed new RLL consolidation on CT 04/2020 concerning for inflammation. PET scan 07/01/20 showed hypermetabolism to the now 3.3 cm RLL mass. 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 She was referred to ID and started on IV and oral antibiotics -most recent angio chest CT 12/07/21 showed partial response to treatment with slight decrease in size. -she continues IV antibiotics per ID

## 2022-04-12 NOTE — Assessment & Plan Note (Signed)
-  diagnosed in 09/2017. S/p B/l mastectomies by Dr Brantley Stage and adjuvant radiation by Dr Lisbeth Renshaw -She started on Anastrozole in 04/2018, switched to exemestane in 08/2020 due to concerns with joint pain. Ultimately discontinued 07/2021 due to worsening joint pain from osteoarthritis.  -We previously discussed switching to tamoxifen, patient declined due to concern for side effects.  Her arthralgia did not improve after she came off exemestane, so she restarted exemestane.

## 2022-04-12 NOTE — Assessment & Plan Note (Signed)
pT3N0M0, stage II -diagnosed in 08/2019 by colonoscopy for anemia. 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 Margins and lymph nodes negative. -Adjuvant chemotherapy was not recommended -GuardianReveal in 04/2020 was negative. Repeat not performed due to insurance. -surveillance CT CAP 08/06/21 showed NED.

## 2022-04-12 NOTE — Progress Notes (Unsigned)
[place]   [phone] [phone]   Clinic Follow up Note   Patient Care Team: [name], [place] as PCP - General (Nurse Practitioner) [name], MD as Consulting Physician (General Surgery) Magrinat, [name], MD (Inactive) as Consulting Physician (Oncology) [name], MD as Consulting Physician (Radiation Oncology) [name], RN as Registered Nurse [name], MD as Referring Physician (Dermatology) [name], RN (Inactive) as Oncology Nurse Navigator [name], MD as Consulting Physician (Hematology) [name], MD as Consulting Physician (General Surgery) [name], MD as Consulting Physician (Hematology) Kristeen Miss, MD as Consulting Physician (Neurosurgery)  Date of Service:  04/13/2022  CHIEF COMPLAINT: f/u of colon cancer, h/o breast cancer   CURRENT THERAPY:  Surveillance   ASSESSMENT:  Sonia Sandoval is a 71 y.o. female with   Malignant neoplasm of lower-outer quadrant of right breast of female, estrogen receptor positive ([place]) -diagnosed in 09/2017. S/p B/l mastectomies by Dr Brantley Stage and adjuvant radiation by Dr Lisbeth Renshaw -She started on Anastrozole in 04/2018, switched to exemestane in 08/2020 due to concerns with joint pain. Ultimately discontinued 07/2021 due to worsening joint pain from osteoarthritis.  -We previously discussed switching to tamoxifen, patient declined due to concern for side effects.  Her arthralgia did not improve after she came off exemestane, so she restarted exemestane.  Cancer of sigmoid colon ([place]) pT3N0M0, stage II -diagnosed in 08/2019 by colonoscopy for anemia. 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 Margins and lymph nodes negative. -Adjuvant chemotherapy was not recommended -GuardianReveal in 04/2020 was negative.  Repeat not performed due to insurance. -surveillance CT CAP 08/06/21 showed NED.  Cavitary lesion of lung -nodules initially seen on staging CT for colon cancer 08/2019, developed new RLL consolidation on CT 04/2020 concerning for inflammation. PET scan 07/01/20 showed hypermetabolism to the now 3.3 cm RLL mass. 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 She was referred to ID and started on IV and oral antibiotics -most recent angio chest CT 12/07/21 showed partial response to treatment with slight decrease in size. -she continues IV antibiotics per ID     PLAN: -lab reviewed stable -CT chest reviewed, improved right cavity lesion, she will continue antibiotics per ID  -lab and CT CAP w contrast and f/u in 17mhs      SUMMARY OF ONCOLOGIC HISTORY: Oncology History Overview Note  Cancer Staging Cancer of sigmoid colon ([facility] Staging form: Colon and Rectum, AJCC 8th Edition - Pathologic stage from 11/22/2019: Stage IIA (pT3, pN0, cM0) - Signed by FTruitt Merle MD on 12/17/2019  Malignant neoplasm of lower-outer quadrant of right breast of female, estrogen receptor positive ([place] Staging form: Breast, AJCC 8th Edition - Clinical: Stage IIB (cT2, cN1, cM0, G3, ER+, PR+, HER2-) - Unsigned - Pathologic: Stage IIA (pT2, pN1(sn), cM0, G3, ER+, PR+, HER2-) - Unsigned    Malignant neoplasm of lower-outer quadrant of right breast of female, estrogen receptor positive ([place]  10/23/2017 Initial Diagnosis    right breast lower outer quadrant biopsy 10/23/2017 for a clinical T2 N1, stage IIB invasive ductal carcinoma, grade 3, estrogen and progesterone receptor positive, HER-2 not amplified, with an MIB-1 of 20%   11/07/2017 Miscellaneous   Mammaprint on was read as low risk, predicting no significant chemotherapy benefit with a 5-year distant disease-free survival in the 97-98% range with hormone therapy alone   12/05/2017 Surgery   status  post bilateral mastectomies on 12/05/2017 showing              (a) on the left, no evidence of malignancy             (b) on the right, a pT2 pN1, stage IIB invasive ductal carcinoma, grade 3, with negative margins. By Dr Brantley Stage   01/15/2018 - 02/28/2018 Radiation Therapy   adjuvant radiation 01/15/2018 - 02/28/2018 with Dr Lisbeth Renshaw              Site/dose: The patient initially received a dose of 50.4 Gy in 28 fractions to the chest wall and supraclavicular region. This was delivered using a 3-D conformal, 4 field technique. The patient then received a boost to the mastectomy scar. This delivered an additional 10 Gy in 5 fractions using an en face electron field. The total dose was 60.4 Gy.    04/01/2018 -  Anti-estrogen oral therapy   started anastrozole in 04/2018. Switched to Exemestane in 08/2020 due to joint pain.             (a) bone density 03/07/2018 with a T score of -1.8. Her 04/21/20 DEXA showed osteopenia -1.8 at right total femur, with low risk for fracture.   10/28/2019 Genetic Testing   Negative genetic testing.  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  The Common Hereditary Gene Panel offered by [facility] includes sequencing and/or deletion duplication testing of the following 48 genes: APC, ATM, AXIN2, BARD1, BMPR1A, BRCA1, BRCA2, BRIP1, CDH1, CDK4, CDKN2A (p14ARF), CDKN2A (p16INK4a), CHEK2, CTNNA1, DICER1, EPCAM (Deletion/duplication testing only), GREM1 (promoter region deletion/duplication testing only), KIT, MEN1, MLH1, MSH2, MSH3, MSH6, MUTYH, NBN, NF1, NHTL1, PALB2, PDGFRA, PMS2, POLD1, POLE, PTEN, RAD50, RAD51C, RAD51D, RNF43, SDHB, SDHC, SDHD, SMAD4, SMARCA4. STK11, TP53, TSC1, TSC2, and VHL.  The following genes were evaluated for sequence changes only: SDHA and HOXB13 c.251G>A variant only. The report date is October 28, 2019.   03/08/2021 Imaging   CT Chest w/o contrast  IMPRESSION: 1. The previous FDG avid lung mass within the anterior right lower lobe has decreased in size compared with previous exam  compatible with response to therapy. The cavitary lung nodule within the right apex is slightly increased in size from previous exam. Additionally, there is a new irregular nodule within the medial right upper lobe measuring 6 mm. Previously 3 mm. 2. Interval increase in size of cavitary nodule within the right lung apex. This measures 2.3 x 1.5 cm, compared with 1.3 x 0.9 cm previously. Underlying malignancy cannot be excluded. 3. New small nodule within the right upper lobe measures 6 mm. This has a nonspecific appearance but warrants attention on follow-up imaging. 4. The remaining small pulmonary nodules noted previously are stable in the interval. 5. Coronary artery calcifications noted. 6. Aortic Atherosclerosis (ICD10-I70.0).   Cancer of sigmoid colon ([place])  09/23/2019 Tumor Marker   Baseline CEA 1.4 on 09/23/19    09/23/2019 Procedure   Colonoscopy and Upper endoscopy with Dr Alessandra Bevels 09/23/19  Upper Endoscopy Impression - Z-line regular, 35 cm from the incisors. - Small hiatal hernia. - Non-bleeding gastric ulcer. Biopsied. - Hematin (altered blood/coffee-ground-like material) in the gastric body. - Normal duodenal bulb, first portion of the duodenum and second portion of - Normal duodenal bulb, first portion of the duodenum and second portion of the duodenum.  Colonoscopy Impression - Preparation of the colon was poor. - Perianal skin tags found on perianal exam. - Likely malignant tumor in the recto-sigmoid colon.  Biopsied. Tattooed. - Internal hemorrhoids.   09/23/2019 Initial Biopsy   FINAL MICROSCOPIC DIAGNOSIS: 09/23/19 A. STOMACH, ULCER, BIOPSY:  - Gastric antral mucosa with mild nonspecific reactive gastropathy  - Warthin Starry stain is negative for Helicobacter pylori   B. COLON, RECTOSIGMOID, MASS, BIOPSY:  - Adenocarcinoma, see comment  COMMENT:   B.  Immunohistochemical stains for MMR-related proteins are pending and  will be reported in an addendum. Dr.  Saralyn Pilar reviewed the case and  concurs with the diagnosis. Dr. Alessandra Bevels was notified on 09/24/2019.     ADDENDUM:  Mismatch Repair Protein (IHC)  SUMMARY INTERPRETATION: NORMAL  There is preserved expression of the major MMR proteins. There is a very  low probability that microsatellite instability (MSI) is present.  However, certain clinically significant MMR protein mutations may result  in preservation of nuclear expression. It is recommended that the  preservation of protein expression be correlated with molecular based  MSI testing.   IHC EXPRESSION RESULTS  TEST           RESULT  MLH1:          Preserved nuclear expression  MSH2:          Preserved nuclear expression  MSH6:          Preserved nuclear expression  PMS2:          Preserved nuclear expression    09/23/2019 Imaging   CT CAP W Contrast 09/23/19  IMPRESSION: 1. There is eccentric wall thickening of the rectosigmoid colon, in keeping with mass identified and biopsied by colonoscopy.   2. There are prominent subcentimeter lymph nodes in the sigmoid mesocolon, suspicious for nodal metastatic disease. There is no other lymphadenopathy in the abdomen or pelvis.   3. There are multiple small bilateral pulmonary nodules measuring up to 4 mm, nonspecific although very suspicious for pulmonary metastatic disease. Given history of breast malignancy, previous imaging of the chest would be very helpful to assess for stability. This report may be addended for comparison if prior imaging can be obtained.   4. Status post bilateral mastectomy with irregular subpleural opacity of the anterior right upper lobe, consistent with history of breast malignancy and radiation fibrosis.   5. There is adjacent nodularity of the right pulmonary apex, the largest discrete nodular component measuring up to 1.1 cm. This is more favored infectious or inflammatory although nonspecific and metastatic disease is not excluded. As as above,  prior imaging would be very helpful to assess.   6. Small focus of nonspecific infectious or inflammatory airspace disease of the perihilar left upper lobe.   09/26/2019 Initial Diagnosis   Cancer of sigmoid colon ([place])   10/28/2019 PET scan   IMPRESSION: 1. Increased tracer uptake within the pelvis corresponding to wall thickening of the rectosigmoid colon favored to represent primary colorectal carcinoma. No findings of FDG avid nodal metastasis or solid organ metastasis within the abdomen or pelvis. 2. Within the right lung apex there is a cluster of 3 subpleural nodular densities which exhibit varying degrees of mild FDG uptake with SUV max between 1.9 and 3.1. Morphologically the appearance is suggestive of benign postinflammatory pleuroparenchymal scarring. This would be an atypical pattern of metastatic disease, which is considered less favored, but not excluded. Attention in this area on serial follow-up imaging is advised. 3. Two small, less than 5 mm left lower lobe lung nodules are too small to reliably characterize. 4.  Aortic Atherosclerosis (ICD10-I70.0). 5. Nonobstructing right renal calculus.  11/22/2019 Cancer Staging   Staging form: Colon and Rectum, AJCC 8th Edition - Pathologic stage from 11/22/2019: Stage IIA (pT3, pN0, cM0) - Signed by [name], MD on 12/17/2019   11/22/2019 Surgery   XI ROBOT ASSISTED SIGMOIDECTOMY and FLEXIBLE SIGMOIDOSCOPY by Dr white and Dr Marcello Moores   11/22/2019 Pathology Results   FINAL MICROSCOPIC DIAGNOSIS:   A. SIGMOID COLON, RESECTION:  - Invasive moderately differentiated adenocarcinoma, 4.5 cm, involving distal sigmoid colon  - Carcinoma focally invades into pericolonic soft tissue  - Resection margins are negative for carcinoma  - Negative for lymphovascular or perineural invasion  - Eighteen benign lymph nodes (0/18)  - See oncology table   B. FINAL DISTAL MARGIN, EXCISION:  - Colonic donut, negative for carcinoma     04/15/2020 Imaging   CT Chest  IMPRESSION: 1. New nodular 1.9 cm focus of consolidation in the anterior right lower lobe with surrounding mild patchy tree-in-bud opacity, nonspecific but favoring infectious/inflammatory etiology. 2. Clustered nodularity in the apical right upper lobe is stable in size and newly cavitary. These findings are indeterminate for atypical infection such as MAI versus response to therapy of pulmonary metastases. Additional scattered tiny left pulmonary nodules are stable. Suggest close chest CT follow-up in 3 months. 3. No thoracic adenopathy. 4. Stable dilated main pulmonary artery, suggesting chronic pulmonary arterial hypertension. 5. Aortic Atherosclerosis (ICD10-I70.0).   04/17/2020 Tumor Marker   Her Guardant Reveal showed negative - ctDNA not detected.    07/08/2020 Imaging   CT Chest  IMPRESSION: Slight reduction in size of hypermetabolic right upper lobe pulmonary nodule, nonspecific though favored to represent area of infection/inflammation as was initially suggested on chest CT performed 04/15/2020. No biopsy attempted at this time.   03/08/2021 Imaging   CT Chest w/o contrast  IMPRESSION: 1. The previous FDG avid lung mass within the anterior right lower lobe has decreased in size compared with previous exam compatible with response to therapy. The cavitary lung nodule within the right apex is slightly increased in size from previous exam. Additionally, there is a new irregular nodule within the medial right upper lobe measuring 6 mm. Previously 3 mm. 2. Interval increase in size of cavitary nodule within the right lung apex. This measures 2.3 x 1.5 cm, compared with 1.3 x 0.9 cm previously. Underlying malignancy cannot be excluded. 3. New small nodule within the right upper lobe measures 6 mm. This has a nonspecific appearance but warrants attention on follow-up imaging. 4. The remaining small pulmonary nodules noted previously are stable in  the interval. 5. Coronary artery calcifications noted. 6. Aortic Atherosclerosis (ICD10-I70.0).   04/04/2022 Imaging    IMPRESSION: 1. Cavitary right apical lesion has become confluent in the interval, measuring minimally smaller at 3.1 x 2.1 cm compared to 3.2 x 2.8 cm previously. 2. Stable left lower lobe pulmonary nodules measuring up to 6 mm. 3. Enlargement of the pulmonary outflow tract/main pulmonary arteries suggests pulmonary arterial hypertension. 4.  Aortic Atherosclerosis (ICD10-I70.0).      INTERVAL HISTORY:  Sonia Sandoval is here for a follow up of  colon cancer, h/o breast cancer  She was last seen by me on 02/11/2022 She presents to the clinic alone. Pt reports that s he has Intermittent pain going on with her lower right lung. Tender to the touch and when she lays down.   All other systems were reviewed with the patient and are negative.  MEDICAL HISTORY:  Past Medical History:  Diagnosis Date   Allergies 1995  Per patient report 11/14/18   Anemia    prior to finding colon cancer, no longer any issues with this per patient   Breast cancer [facility])    Breast cancer, right breast ([place]) 09/2017   S/P mastectomy 12/05/2017   Colon cancer ([place])    COPD (chronic obstructive pulmonary disease) ([place])    pt reports previous MD diagnosed her with this, but takes no medications   Dyspnea    occasionally with exertion   Family history of breast cancer    Headache 1968   per patient report, started as a teenager   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   Osteopenia 03/2018   Seen on DEXA Scan   PONV (postoperative nausea and vomiting)     SURGICAL HISTORY: Past Surgical History:  Procedure Laterality Date   BACK SURGERY  2003   Patient will occassional back pain since 2003   BIOPSY  09/23/2019   Procedure: BIOPSY;  Surgeon: Otis Brace, MD;  Location:  [facility];  Service: Gastroenterology;;  EGD and COLON   BREAST BIOPSY Right 09/2017   BRONCHIAL BIOPSY  08/31/2021   Procedure: BRONCHIAL BIOPSIES;  Surgeon: Garner Nash, DO;  Location: [place];  Service: Pulmonary;;   BRONCHIAL BRUSHINGS  08/31/2021   Procedure: BRONCHIAL BRUSHINGS;  Surgeon: Garner Nash, DO;  Location: [place];  Service: Pulmonary;;   BRONCHIAL NEEDLE ASPIRATION BIOPSY  08/31/2021   Procedure: BRONCHIAL NEEDLE ASPIRATION BIOPSIES;  Surgeon: Garner Nash, DO;  Location: [place];  Service: Pulmonary;;   BRONCHIAL WASHINGS  08/31/2021   Procedure: BRONCHIAL WASHINGS;  Surgeon: Garner Nash, DO;  Location: [place];  Service: Pulmonary;;   CESAREAN SECTION  1985; 1992   COLONOSCOPY WITH PROPOFOL N/A 09/23/2019   Procedure: COLONOSCOPY WITH PROPOFOL and EGD;  Surgeon: Otis Brace, MD;  Location: [facility];  Service: Gastroenterology;  Laterality: N/A;   DILATION AND CURETTAGE OF UTERUS     ESOPHAGOGASTRODUODENOSCOPY N/A 09/23/2019   Procedure: ESOPHAGOGASTRODUODENOSCOPY (EGD);  Surgeon: Otis Brace, MD;  Location: [facility];  Service: Gastroenterology;  Laterality: N/A;   FLEXIBLE SIGMOIDOSCOPY N/A 11/22/2019   Procedure: FLEXIBLE SIGMOIDOSCOPY;  Surgeon: [name], MD;  Location: [facility];  Service: General;  Laterality: N/A;   LUMBAR [place] SURGERY  2003   MASTECTOMY Left 12/05/2017   PROPHYLACTIC MASTECTOMY   MASTECTOMY COMPLETE / SIMPLE W/ SENTINEL NODE BIOPSY Right 12/05/2017   WITH RADIOACTIVE SEED TARGETED RIGHT AXIILARY LYMPH NODE EXCISION AND RIGHT SENTINEL LYMPH NODE BIOPSY,    MASTECTOMY WITH RADIOACTIVE SEED GUIDED EXCISION AND AXILLARY SENTINEL LYMPH NODE BIOPSY Bilateral 12/05/2017   Procedure: RIGHT SIMPLE MASTECTOMY WITH RADIOACTIVE SEED TARGETED RIGHT AXIILARY LYMPH NODE EXCISION AND RIGHT SENTINEL LYMPH NODE BIOPSY, LEFT PROPHYLACTIC MASTECTOMY;  Surgeon: [name], MD;  Location: [place];  Service:  General;  Laterality: Bilateral;   SUBMUCOSAL TATTOO INJECTION  09/23/2019   Procedure: SUBMUCOSAL TATTOO INJECTION;  Surgeon: Otis Brace, MD;  Location: [facility];  Service: Gastroenterology;;   VIDEO BRONCHOSCOPY WITH RADIAL ENDOBRONCHIAL ULTRASOUND  08/31/2021   Procedure: RADIAL ENDOBRONCHIAL ULTRASOUND;  Surgeon: Garner Nash, DO;  Location: [place] [facility];  Service: Pulmonary;;    I have reviewed the social history and family history with the patient and they are unchanged from previous note.  ALLERGIES:  is allergic to tigecycline and codeine.  MEDICATIONS:  Current Outpatient Medications  Medication Sig  Dispense Refill   albuterol (VENTOLIN HFA) 108 (90 Base) MCG/ACT inhaler TAKE 2 PUFFS BY MOUTH EVERY 6 HOURS AS NEEDED FOR WHEEZE OR SHORTNESS OF BREATH 18 each 2   azithromycin (ZITHROMAX) 500 MG tablet Take 500 mg by mouth daily.     Calcium Carbonate-Vitamin D (CALCIUM 600+D PO) Take 1 tablet by mouth daily.     Cholecalciferol (VITAMIN D) 50 MCG (2000 UT) tablet Take 2,000 Units by mouth daily.     exemestane (AROMASIN) 25 MG tablet Take 1 tablet (25 mg total) by mouth daily after breakfast. 90 tablet 1   gabapentin (NEURONTIN) 300 MG capsule Take 300 mg by mouth daily.     OVER THE COUNTER MEDICATION Take 1 tablet by mouth daily. Juice plus fruit blend     OVER THE COUNTER MEDICATION Take 1 capsule by mouth daily. Juice plus berry blend     OVER THE COUNTER MEDICATION Take 1 capsule by mouth daily. Juice plus veggie blend     pantoprazole (PROTONIX) 40 MG tablet Take 40 mg by mouth every morning.     Probiotic Product (PROBIOTIC PO) Take 2 capsules by mouth daily.     tiZANidine (ZANAFLEX) 4 MG tablet Take 4 mg by mouth daily as needed for muscle spasms.     vitamin E 180 MG (400 UNITS) capsule Take 800 Units by mouth daily.     No current facility-administered medications for this visit.    PHYSICAL EXAMINATION: ECOG PERFORMANCE STATUS: 1 - Symptomatic but  completely ambulatory  Vitals:   04/13/22 1142  BP: (!) 152/93  Pulse: 76  Resp: 18  Temp: 98.4 F (36.9 C)  SpO2: 98%   Wt Readings from Last 3 Encounters:  04/13/22 149 lb 11.2 oz (67.9 kg)  03/16/22 148 lb (67.1 kg)  02/08/22 145 lb (65.8 kg)    GENERAL:alert, no distress and comfortable SKIN: skin color, texture, turgor are normal, no rashes or significant lesions EYES: normal, Conjunctiva are pink and non-injected, sclera clear NECK: supple, thyroid normal size, non-tender, without nodularity LYMPH:  no palpable lymphadenopathy in the cervical, axillary LUNGS: clear to auscultation and percussion with normal breathing effort HEART: regular rate & rhythm and no murmurs and no lower extremity edema ABDOMEN:abdomen soft, non-tender and normal bowel sounds Musculoskeletal:no cyanosis of digits and no clubbing  NEURO: alert & oriented x 3 with fluent speech, no focal motor/sensory deficits BREAST: Double Mastectomy. Scar tissue on the right side .Shoulder mobility is good. No palpable mass. Breast exam is benign.  LABORATORY DATA:  I have reviewed the data as listed    Latest Ref Rng & Units 04/13/2022   10:46 AM 12/06/2021    8:23 PM 11/24/2021   12:50 PM  CBC  WBC 4.0 - 10.5 K/uL 3.8  7.1  5.5   Hemoglobin 12.0 - 15.0 g/dL 11.2  12.3  12.0   Hematocrit 36.0 - 46.0 % 32.5  37.1  35.8   Platelets 150 - 400 K/uL 220  321  219         Latest Ref Rng & Units 04/13/2022   10:46 AM 12/06/2021    8:23 PM 11/24/2021   12:50 PM  CMP  Glucose 70 - 99 mg/dL 91  112  92   BUN 8 - 23 mg/dL _0 Creatinine 0.44 - 1.00 mg/dL 0.74  0.97  0.86   Sodium 135 - 145 mmol/L 139  138  137   Potassium 3.5 - 5.1 mmol/L  4.0  4.4  4.1   Chloride 98 - 111 mmol/L 108  105  102   CO2 22 - 32 mmol/L _0 Calcium 8.9 - 10.3 mg/dL 9.5  9.2  10.0   Total Protein 6.5 - 8.1 g/dL 6.2  7.0  6.4   Total Bilirubin 0.3 - 1.2 mg/dL 0.4  0.3  0.4   Alkaline Phos 38 - 126 U/L 53  137  73    AST 15 - 41 U/L 23  60  34   ALT 0 - 44 U/L 18  148  35       RADIOGRAPHIC STUDIES: I have personally reviewed the radiological images as listed and agreed with the findings in the report. No results found.    Orders Placed This Encounter  Procedures   CT CHEST ABDOMEN PELVIS W CONTRAST    Standing Status:   Future    Standing Expiration Date:   04/14/2023    Order Specific Question:   Preferred imaging location?    Answer:   [facility]    Order Specific Question:   Release to patient    Answer:   Immediate    Order Specific Question:   Is Oral Contrast requested for this exam?    Answer:   Yes, Per Radiology protocol   All questions were answered. The patient knows to call the clinic with any problems, questions or concerns. No barriers to learning was detected. The total time spent in the appointment was 30 minutes.     [name], MD 04/13/2022   Felicity Coyer, CMA, am acting as scribe for [name], MD.   I have reviewed the above documentation for accuracy and completeness, and I agree with the above.

## 2022-04-13 ENCOUNTER — Inpatient Hospital Stay: Payer: Commercial Managed Care - PPO | Attending: Hematology

## 2022-04-13 ENCOUNTER — Inpatient Hospital Stay (HOSPITAL_BASED_OUTPATIENT_CLINIC_OR_DEPARTMENT_OTHER): Payer: Commercial Managed Care - PPO | Admitting: Hematology

## 2022-04-13 VITALS — BP 152/93 | HR 76 | Temp 98.4°F | Resp 18 | Ht 63.0 in | Wt 149.7 lb

## 2022-04-13 DIAGNOSIS — K648 Other hemorrhoids: Secondary | ICD-10-CM | POA: Diagnosis not present

## 2022-04-13 DIAGNOSIS — C7800 Secondary malignant neoplasm of unspecified lung: Secondary | ICD-10-CM | POA: Insufficient documentation

## 2022-04-13 DIAGNOSIS — D649 Anemia, unspecified: Secondary | ICD-10-CM | POA: Insufficient documentation

## 2022-04-13 DIAGNOSIS — C50511 Malignant neoplasm of lower-outer quadrant of right female breast: Secondary | ICD-10-CM

## 2022-04-13 DIAGNOSIS — M85851 Other specified disorders of bone density and structure, right thigh: Secondary | ICD-10-CM | POA: Diagnosis not present

## 2022-04-13 DIAGNOSIS — M1990 Unspecified osteoarthritis, unspecified site: Secondary | ICD-10-CM | POA: Insufficient documentation

## 2022-04-13 DIAGNOSIS — K644 Residual hemorrhoidal skin tags: Secondary | ICD-10-CM | POA: Insufficient documentation

## 2022-04-13 DIAGNOSIS — C187 Malignant neoplasm of sigmoid colon: Secondary | ICD-10-CM

## 2022-04-13 DIAGNOSIS — Z170 Estrogen receptor positive status [ER+]: Secondary | ICD-10-CM

## 2022-04-13 DIAGNOSIS — Z885 Allergy status to narcotic agent status: Secondary | ICD-10-CM | POA: Diagnosis not present

## 2022-04-13 DIAGNOSIS — Z79811 Long term (current) use of aromatase inhibitors: Secondary | ICD-10-CM | POA: Insufficient documentation

## 2022-04-13 DIAGNOSIS — C19 Malignant neoplasm of rectosigmoid junction: Secondary | ICD-10-CM | POA: Insufficient documentation

## 2022-04-13 DIAGNOSIS — I2510 Atherosclerotic heart disease of native coronary artery without angina pectoris: Secondary | ICD-10-CM | POA: Diagnosis not present

## 2022-04-13 DIAGNOSIS — I700 Atherosclerosis of aorta: Secondary | ICD-10-CM | POA: Insufficient documentation

## 2022-04-13 DIAGNOSIS — Z9013 Acquired absence of bilateral breasts and nipples: Secondary | ICD-10-CM | POA: Insufficient documentation

## 2022-04-13 DIAGNOSIS — Z923 Personal history of irradiation: Secondary | ICD-10-CM | POA: Insufficient documentation

## 2022-04-13 DIAGNOSIS — J984 Other disorders of lung: Secondary | ICD-10-CM

## 2022-04-13 DIAGNOSIS — Z79899 Other long term (current) drug therapy: Secondary | ICD-10-CM | POA: Diagnosis not present

## 2022-04-13 DIAGNOSIS — N200 Calculus of kidney: Secondary | ICD-10-CM | POA: Diagnosis not present

## 2022-04-13 LAB — CMP (CANCER CENTER ONLY)
ALT: 18 U/L (ref 0–44)
AST: 23 U/L (ref 15–41)
Albumin: 4.1 g/dL (ref 3.5–5.0)
Alkaline Phosphatase: 53 U/L (ref 38–126)
Anion gap: 8 (ref 5–15)
BUN: 14 mg/dL (ref 8–23)
CO2: 23 mmol/L (ref 22–32)
Calcium: 9.5 mg/dL (ref 8.9–10.3)
Chloride: 108 mmol/L (ref 98–111)
Creatinine: 0.74 mg/dL (ref 0.44–1.00)
GFR, Estimated: >60 mL/min (ref >60)
Glucose, Bld: 91 mg/dL (ref 70–99)
Potassium: 4 mmol/L (ref 3.5–5.1)
Sodium: 139 mmol/L (ref 135–145)
Total Bilirubin: 0.4 mg/dL (ref 0.3–1.2)
Total Protein: 6.2 g/dL — ABNORMAL LOW (ref 6.5–8.1)

## 2022-04-13 LAB — CBC WITH DIFFERENTIAL (CANCER CENTER ONLY)
Abs Immature Granulocytes: 0.01 10*3/uL (ref 0.00–0.07)
Basophils Absolute: 0 10*3/uL (ref 0.0–0.1)
Basophils Relative: 1 %
Eosinophils Absolute: 0.2 10*3/uL (ref 0.0–0.5)
Eosinophils Relative: 6 %
HCT: 32.5 % — ABNORMAL LOW (ref 36.0–46.0)
Hemoglobin: 11.2 g/dL — ABNORMAL LOW (ref 12.0–15.0)
Immature Granulocytes: 0 %
Lymphocytes Relative: 23 %
Lymphs Abs: 0.9 10*3/uL (ref 0.7–4.0)
MCH: 30.6 pg (ref 26.0–34.0)
MCHC: 34.5 g/dL (ref 30.0–36.0)
MCV: 88.8 fL (ref 80.0–100.0)
Monocytes Absolute: 0.4 10*3/uL (ref 0.1–1.0)
Monocytes Relative: 11 %
Neutro Abs: 2.3 10*3/uL (ref 1.7–7.7)
Neutrophils Relative %: 59 %
Platelet Count: 220 10*3/uL (ref 150–400)
RBC: 3.66 MIL/uL — ABNORMAL LOW (ref 3.87–5.11)
RDW: 12.8 % (ref 11.5–15.5)
WBC Count: 3.8 10*3/uL — ABNORMAL LOW (ref 4.0–10.5)
nRBC: 0 % (ref 0.0–0.2)

## 2022-04-13 MED ORDER — EXEMESTANE 25 MG PO TABS: 25.0000 mg | ORAL_TABLET | Freq: Every day | ORAL | 1 refills | Status: DC

## 2022-04-18 ENCOUNTER — Encounter: Payer: Self-pay | Admitting: Pharmacist

## 2022-05-03 ENCOUNTER — Encounter: Payer: Self-pay | Admitting: Pharmacist

## 2022-05-03 NOTE — Telephone Encounter (Signed)
Sending to Dr. Gale Journey.

## 2022-05-04 ENCOUNTER — Telehealth: Payer: Self-pay | Admitting: Pulmonary Disease

## 2022-05-04 ENCOUNTER — Other Ambulatory Visit: Payer: Self-pay

## 2022-05-04 DIAGNOSIS — J984 Other disorders of lung: Secondary | ICD-10-CM

## 2022-05-04 NOTE — Telephone Encounter (Signed)
Called and spoke with patient. She states after her last visit with Dr. Gale Journey he wanted her to have a bronchoscopy done around January with Dr. Valeta Harms.  Dr. Valeta Harms please advise.

## 2022-05-04 NOTE — Telephone Encounter (Signed)
Patient called to follow up with Dr. Valeta Harms regarding a request for a bronchoscopy from Dr. Gale Journey.  She stated she has not heard back from the doctor regarding this request. Please call patient to follow up.  CB# [phone]

## 2022-05-05 ENCOUNTER — Encounter: Payer: Self-pay | Admitting: Pulmonary Disease

## 2022-05-05 ENCOUNTER — Other Ambulatory Visit: Payer: Self-pay

## 2022-05-05 DIAGNOSIS — J984 Other disorders of lung: Secondary | ICD-10-CM

## 2022-05-05 NOTE — Telephone Encounter (Signed)
PCCM:  Yes we can get her scheduled for repeat bronchoscopy.  We can use ct chest from December 2023  I have placed orders for this as well as the bronchoscopy to be scheduled on January 16.  [facility] 05/05/2022 12:30 PM

## 2022-05-05 NOTE — Telephone Encounter (Signed)
Pt has been scheduled.  Appt info is in referral.  Nothing further needed.

## 2022-05-13 ENCOUNTER — Other Ambulatory Visit: Payer: Commercial Managed Care - PPO

## 2022-05-13 DIAGNOSIS — J984 Other disorders of lung: Secondary | ICD-10-CM

## 2022-05-15 LAB — NOVEL CORONAVIRUS, NAA: SARS-CoV-2, NAA: NOT DETECTED

## 2022-05-15 LAB — SPECIMEN STATUS REPORT

## 2022-05-16 ENCOUNTER — Other Ambulatory Visit: Payer: Self-pay

## 2022-05-16 ENCOUNTER — Encounter (HOSPITAL_COMMUNITY): Payer: Self-pay | Admitting: Pulmonary Disease

## 2022-05-16 NOTE — Progress Notes (Addendum)
PCP - FNP-Anderson  Infectious Disease- Dr. Gale Journey   Cardiologist - Denies  EP- Denies  Endocrine- Denies  Pulm- Denies  Chest x-ray - 12/06/21 (E)  EKG - 12/06/21 (E)  Stress Test - Denies  ECHO - Denies  Cardiac Cath - Denies  AICD-na PM-na LOOP-na  Nerve Stimulator- Denies  Dialysis- Denies  Sleep Study - Denies CPAP - Denies  LABS- 05/17/22: CBC, CMP 05/13/22(E): COVID- Neg  ASA- Denies  ERAS- No  HA1C- Denies  Anesthesia- No  Pt denies having chest pain, sob, or fever during the pre-op phone call. All instructions explained to the pt, with a verbal understanding of the material. Pt also instructed to wear a mask and social distance after being tested for COVID-19. The opportunity to ask questions was provided.

## 2022-05-16 NOTE — Progress Notes (Signed)
S.D.W- Instructions   Your procedure is scheduled on Tues., [name]. 16, 2024 from 7:30AM-9:00AM.  Report to [facility] Main Entrance "A" at 5:30 A.M., then check in with the Admitting office.  Call this number if you have problems the morning of surgery:  [phone]             If you experience any cold or flu symptoms such as cough, fever, chills, shortness of breath, etc. between now and your scheduled surgery, please notify us at the above         number.  Masks are now required throughout our facilities due to the increasing cases of Covid, Flu, and RSV infections.   Remember:  Do not eat or drink after midnight on [name]. 15th    Take these medicines the morning of surgery with A SIP OF WATER:  Prn: Xanax  As of today, STOP taking any Aspirin (unless otherwise instructed by your surgeon) Aleve, Naproxen, Ibuprofen, Motrin, Advil, Goody's, BC's, all herbal medications, fish oil, and all vitamins.          Do not wear jewelry or makeup. Do not wear lotions, powders, perfumes, or deodorant. Do not shave 48 hours prior to surgery.   Do not bring valuables to the hospital. Do not wear nail polish, gel polish, artificial nails, or any other type of covering on natural nails (fingers and toes) If you have artificial nails or gel coating that need to be removed by a nail salon, please have this removed prior to surgery. Artificial nails or gel coating may interfere with anesthesia's ability to adequately monitor your vital signs.  [facility] is not responsible for any belongings or valuables.    Do NOT Smoke (Tobacco/Vaping)  24 hours prior to your procedure  If you use a CPAP at night, you may bring your mask for your overnight stay.   Contacts, glasses, hearing aids, dentures or partials may not be worn into surgery, please bring cases for these belongings   For patients admitted to the hospital, discharge time will be determined by your treatment team.   Patients discharged the day  of surgery will not be allowed to drive home, and someone needs to stay with them for 24 hours.  Special instructions:    Oral Hygiene is also important to reduce your risk of infection.  Remember - BRUSH YOUR TEETH THE MORNING OF SURGERY WITH YOUR REGULAR TOOTHPASTE  - Preparing For Surgery  Before surgery, you can play an important role. Because skin is not sterile, your skin needs to be as free of germs as possible. You can reduce the number of germs on your skin by washing with Antibacterial Soap before surgery.     Please follow these instructions carefully.     Shower the NIGHT BEFORE SURGERY and the MORNING OF SURGERY with Antibacterial Soap.   Pat yourself dry with a CLEAN TOWEL.  Wear CLEAN PAJAMAS to bed the night before surgery  Place CLEAN SHEETS on your bed the night before your surgery  DO NOT SLEEP WITH PETS.  Day of Surgery:  Take a shower with Antibacterial soap. Wear Clean/Comfortable clothing the morning of surgery Do not apply any deodorants/lotions.   Remember to brush your teeth WITH YOUR REGULAR TOOTHPASTE.   If you test positive for Covid, or been in contact with anyone that has tested positive in the last 10 days, please notify your surgeon.  SURGICAL WAITING ROOM VISITATION Patients having surgery or a procedure may have  no more than 2 support people in the waiting area - these visitors may rotate.   Children under the age of 30 must have an adult with them who is not the patient. If the patient needs to stay at the hospital during part of their recovery, the visitor guidelines for inpatient rooms apply. Pre-op nurse will coordinate an appropriate time for 1 support person to accompany patient in pre-op.  This support person may not rotate.   Please refer to the [facility] website for the visitor guidelines for Inpatients (after your surgery is over and you are in a regular room).

## 2022-05-17 ENCOUNTER — Ambulatory Visit (HOSPITAL_COMMUNITY)
Admission: RE | Admit: 2022-05-17 | Discharge: 2022-05-17 | Disposition: A | Discharge status: Home / Self Care | Payer: Commercial Managed Care - PPO | Source: Ambulatory Visit | Attending: Pulmonary Disease | Admitting: Pulmonary Disease

## 2022-05-17 ENCOUNTER — Ambulatory Visit (HOSPITAL_COMMUNITY): Payer: Commercial Managed Care - PPO

## 2022-05-17 ENCOUNTER — Ambulatory Visit (HOSPITAL_BASED_OUTPATIENT_CLINIC_OR_DEPARTMENT_OTHER): Payer: Commercial Managed Care - PPO | Admitting: Certified Registered Nurse Anesthetist

## 2022-05-17 ENCOUNTER — Encounter (HOSPITAL_COMMUNITY)
Admission: RE | Disposition: A | Discharge status: Home / Self Care | Payer: Self-pay | Source: Ambulatory Visit | Attending: Pulmonary Disease

## 2022-05-17 ENCOUNTER — Ambulatory Visit (HOSPITAL_COMMUNITY): Payer: Commercial Managed Care - PPO | Admitting: Certified Registered Nurse Anesthetist

## 2022-05-17 ENCOUNTER — Encounter (HOSPITAL_COMMUNITY): Payer: Self-pay | Admitting: Pulmonary Disease

## 2022-05-17 DIAGNOSIS — Z8615 Personal history of latent tuberculosis infection: Secondary | ICD-10-CM

## 2022-05-17 DIAGNOSIS — J984 Other disorders of lung: Secondary | ICD-10-CM

## 2022-05-17 DIAGNOSIS — Z7722 Contact with and (suspected) exposure to environmental tobacco smoke (acute) (chronic): Secondary | ICD-10-CM | POA: Insufficient documentation

## 2022-05-17 DIAGNOSIS — Z923 Personal history of irradiation: Secondary | ICD-10-CM | POA: Insufficient documentation

## 2022-05-17 DIAGNOSIS — Z853 Personal history of malignant neoplasm of breast: Secondary | ICD-10-CM | POA: Insufficient documentation

## 2022-05-17 DIAGNOSIS — R911 Solitary pulmonary nodule: Secondary | ICD-10-CM

## 2022-05-17 DIAGNOSIS — Z9889 Other specified postprocedural states: Secondary | ICD-10-CM | POA: Insufficient documentation

## 2022-05-17 DIAGNOSIS — Z85038 Personal history of other malignant neoplasm of large intestine: Secondary | ICD-10-CM | POA: Diagnosis not present

## 2022-05-17 HISTORY — PX: BRONCHIAL BIOPSY: SHX5109

## 2022-05-17 HISTORY — PX: BRONCHIAL WASHINGS: SHX5105

## 2022-05-17 HISTORY — DX: Anxiety disorder, unspecified: F41.9

## 2022-05-17 LAB — CBC
HCT: 30.4 % — ABNORMAL LOW (ref 36.0–46.0)
Hemoglobin: 10.1 g/dL — ABNORMAL LOW (ref 12.0–15.0)
MCH: 30 pg (ref 26.0–34.0)
MCHC: 33.2 g/dL (ref 30.0–36.0)
MCV: 90.2 fL (ref 80.0–100.0)
Platelets: 209 10*3/uL (ref 150–400)
RBC: 3.37 MIL/uL — ABNORMAL LOW (ref 3.87–5.11)
RDW: 13.4 % (ref 11.5–15.5)
WBC: 2.7 10*3/uL — ABNORMAL LOW (ref 4.0–10.5)
nRBC: 0 % (ref 0.0–0.2)

## 2022-05-17 LAB — BASIC METABOLIC PANEL
Anion gap: 10 (ref 5–15)
BUN: 16 mg/dL (ref 8–23)
CO2: 22 mmol/L (ref 22–32)
Calcium: 8.8 mg/dL — ABNORMAL LOW (ref 8.9–10.3)
Chloride: 107 mmol/L (ref 98–111)
Creatinine, Ser: 0.77 mg/dL (ref 0.44–1.00)
GFR, Estimated: >60 mL/min (ref >60)
Glucose, Bld: 95 mg/dL (ref 70–99)
Potassium: 3.8 mmol/L (ref 3.5–5.1)
Sodium: 139 mmol/L (ref 135–145)

## 2022-05-17 SURGERY — BRONCHOSCOPY, WITH BIOPSY USING ELECTROMAGNETIC NAVIGATION
Anesthesia: General | Laterality: Right

## 2022-05-17 MED ORDER — PROPOFOL 500 MG/50ML IV EMUL
INTRAVENOUS | Status: DC | PRN
  Administered 2022-05-17: 125 ug/kg/min via INTRAVENOUS

## 2022-05-17 MED ORDER — SUGAMMADEX SODIUM 200 MG/2ML IV SOLN
INTRAVENOUS | Status: DC | PRN
  Administered 2022-05-17: 200 mg via INTRAVENOUS

## 2022-05-17 MED ORDER — LIDOCAINE 2% (20 MG/ML) 5 ML SYRINGE
INTRAMUSCULAR | Status: DC | PRN
  Administered 2022-05-17: 60 mg via INTRAVENOUS

## 2022-05-17 MED ORDER — HEPARIN SOD (PORK) LOCK FLUSH 100 UNIT/ML IV SOLN
250.0000 [IU] | INTRAVENOUS | Status: AC | PRN
  Administered 2022-05-17: 250 [IU]

## 2022-05-17 MED ORDER — FENTANYL CITRATE (PF) 250 MCG/5ML IJ SOLN
INTRAMUSCULAR | Status: DC | PRN
  Administered 2022-05-17: 100 ug via INTRAVENOUS

## 2022-05-17 MED ORDER — ROCURONIUM BROMIDE 10 MG/ML (PF) SYRINGE
PREFILLED_SYRINGE | INTRAVENOUS | Status: DC | PRN
  Administered 2022-05-17: 60 mg via INTRAVENOUS

## 2022-05-17 MED ORDER — DEXAMETHASONE SODIUM PHOSPHATE 10 MG/ML IJ SOLN
INTRAMUSCULAR | Status: DC | PRN
  Administered 2022-05-17: 10 mg via INTRAVENOUS

## 2022-05-17 MED ORDER — MIDAZOLAM HCL 2 MG/2ML IJ SOLN
INTRAMUSCULAR | Status: DC | PRN
  Administered 2022-05-17: 2 mg via INTRAVENOUS

## 2022-05-17 MED ORDER — CHLORHEXIDINE GLUCONATE 0.12 % MT SOLN
15.0000 mL | OROMUCOSAL | Status: DC
  Filled 2022-05-17: qty 15

## 2022-05-17 MED ORDER — ONDANSETRON HCL 4 MG/2ML IJ SOLN
INTRAMUSCULAR | Status: DC | PRN
  Administered 2022-05-17: 4 mg via INTRAVENOUS

## 2022-05-17 MED ORDER — PROPOFOL 10 MG/ML IV BOLUS
INTRAVENOUS | Status: DC | PRN
  Administered 2022-05-17: 100 mg via INTRAVENOUS

## 2022-05-17 MED ORDER — LACTATED RINGERS IV SOLN: INTRAVENOUS | Status: DC | PRN

## 2022-05-17 NOTE — Op Note (Signed)
Video Bronchoscopy with Robotic Assisted Bronchoscopic Navigation   Date of Operation: 05/17/2022   Pre-op Diagnosis: Right upper lobe cavitary lesion, nodule, history of Mycobacterium abscessus  Post-op Diagnosis: Right upper lobe cavitary lesion, nodule, history of Mycobacterium abscessus  Surgeon: Garner Nash, DO   Assistants: None   Anesthesia: General endotracheal anesthesia  Operation: Flexible video fiberoptic bronchoscopy with robotic assistance and biopsies.  Estimated Blood Loss: Minimal  Complications: None  Indications and History: Sonia Sandoval is a 72 y.o. female with history of right upper lobe cavitary lesion, nodule, history of Mycobacterium abscessus. The risks, benefits, complications, treatment options and expected outcomes were discussed with the patient.  The possibilities of pneumothorax, pneumonia, reaction to medication, pulmonary aspiration, perforation of a viscus, bleeding, failure to diagnose a condition and creating a complication requiring transfusion or operation were discussed with the patient who freely signed the consent.    Description of Procedure: The patient was seen in the Preoperative Area, was examined and was deemed appropriate to proceed.  The patient was taken to [facility] endoscopy room 3, identified as [name] and the procedure verified as Flexible Video Fiberoptic Bronchoscopy.  A Time Out was held and the above information confirmed.   Prior to the date of the procedure a high-resolution CT scan of the chest was performed. Utilizing ION software program a virtual tracheobronchial tree was generated to allow the creation of distinct navigation pathways to the patient's parenchymal abnormalities. After being taken to the operating room general anesthesia was initiated and the patient  was orally intubated. The video fiberoptic bronchoscope was introduced via the endotracheal tube and a general inspection was performed which showed normal  right and left lung anatomy, aspiration of the bilateral mainstems was completed to remove any remaining secretions. Robotic catheter inserted into patient's endotracheal tube.   Target #1 RUL nodule, history of Mycobacterium abscessus: The distinct navigation pathways prepared prior to this procedure were then utilized to navigate to patient's lesion identified on CT scan. The robotic catheter was secured into place and the vision probe was withdrawn.  Lesion location was approximated using fluoroscopy. Under fluoroscopic guidance transbronchial forceps biopsies were performed to be sent for cytology and pathology. A bronchioalveolar lavage was performed in the RUL.  At the end of the procedure a general airway inspection was performed and there was no evidence of active bleeding. The bronchoscope was removed.  The patient tolerated the procedure well. There was no significant blood loss and there were no obvious complications. A post-procedural chest x-ray is pending.  Samples Target #1: 1. Transbronchial forceps biopsies from RUL 2. Bronchoalveolar lavage from RUL   Plans:  The patient will be discharged from the PACU to home when recovered from anesthesia and after chest x-ray is reviewed. We will review the microbiology results with the patient when they become available. Outpatient followup will be with [name] [name] DO.    Garner Nash, DO [facility] 05/17/2022 8:25 AM

## 2022-05-17 NOTE — Transfer of Care (Signed)
Immediate Anesthesia Transfer of Care Note  Patient: Sonia Sandoval  Procedure(s) Performed: ROBOTIC ASSISTED NAVIGATIONAL BRONCHOSCOPY (Right) BRONCHIAL WASHINGS BRONCHIAL BIOPSIES  Patient Location: PACU  Anesthesia Type:General  Level of Consciousness: awake, alert , and oriented  Airway & Oxygen Therapy: Patient Spontanous Breathing  Post-op Assessment: Report given to RN and Post -op Vital signs reviewed and stable  Post vital signs: Reviewed and stable  Last Vitals:  Vitals Value Taken Time  BP 143/86 05/17/22 0830  Temp 36.5 C 05/17/22 0830  Pulse 74 05/17/22 0834  Resp 15 05/17/22 0834  SpO2 97 % 05/17/22 0834  Vitals shown include unvalidated device data.  Last Pain:  Vitals:   05/17/22 0614  TempSrc:   PainSc: 0-No pain      Patients Stated Pain Goal: 0 (99/37/16 9678)  Complications: No notable events documented.

## 2022-05-17 NOTE — Anesthesia Preprocedure Evaluation (Addendum)
Anesthesia Evaluation  Patient identified by MRN, date of birth, ID band Patient awake    Reviewed: Allergy & Precautions, NPO status , Patient's Chart, lab work & pertinent test results  History of Anesthesia Complications (+) PONV and history of anesthetic complications  Airway Mallampati: II  TM Distance: >3 FB Neck ROM: Full    Dental  (+) Teeth Intact, Dental Advisory Given   Pulmonary shortness of breath and with exertion, COPD   breath sounds clear to auscultation       Cardiovascular negative cardio ROS  Rhythm:Regular     Neuro/Psych  Headaches PSYCHIATRIC DISORDERS Anxiety        GI/Hepatic negative GI ROS, Neg liver ROS,,,  Endo/Other  negative endocrine ROS    Renal/GU negative Renal ROS     Musculoskeletal negative musculoskeletal ROS (+)    Abdominal   Peds  Hematology  (+) Blood dyscrasia, anemia Lab Results      Component                Value               Date                      WBC                      2.7 (L)             05/17/2022                HGB                      10.1 (L)            05/17/2022                HCT                      30.4 (L)            05/17/2022                MCV                      90.2                05/17/2022                PLT                      209                 05/17/2022              Anesthesia Other Findings   Reproductive/Obstetrics                             Anesthesia Physical Anesthesia Plan  ASA: 2  Anesthesia Plan: General   Post-op Pain Management:    Induction: Intravenous  PONV Risk Score and Plan: 4 or greater and Ondansetron, Dexamethasone, TIVA and Propofol infusion  Airway Management Planned: Oral ETT  Additional Equipment: None  Intra-op Plan:   Post-operative Plan: Extubation in OR  Informed Consent: I have reviewed the patients History and Physical, chart, labs and discussed the procedure  including the risks, benefits and alternatives for the proposed anesthesia with the patient or  authorized representative who has indicated his/her understanding and acceptance.     Dental advisory given  Plan Discussed with: CRNA  Anesthesia Plan Comments:         Anesthesia Quick Evaluation

## 2022-05-17 NOTE — Anesthesia Postprocedure Evaluation (Signed)
Anesthesia Post Note  Patient: Sonia Sandoval  Procedure(s) Performed: ROBOTIC ASSISTED NAVIGATIONAL BRONCHOSCOPY (Right) BRONCHIAL WASHINGS BRONCHIAL BIOPSIES     Patient location during evaluation: PACU Anesthesia Type: General Level of consciousness: awake and patient cooperative Pain management: pain level controlled Vital Signs Assessment: post-procedure vital signs reviewed and stable Respiratory status: spontaneous breathing, nonlabored ventilation and respiratory function stable Cardiovascular status: blood pressure returned to baseline and stable Postop Assessment: no apparent nausea or vomiting Anesthetic complications: no   No notable events documented.  Last Vitals:  Vitals:   05/17/22 0930 05/17/22 0945  BP: (!) 151/67 122/74  Pulse: 75 69  Resp: 19 13  Temp: 36.5 C   SpO2: 98% 97%    Last Pain:  Vitals:   05/17/22 0930  TempSrc:   PainSc: 0-No pain                 Dejuana Weist

## 2022-05-17 NOTE — H&P (Signed)
Synopsis: Referred in April 2023 for lung nodule by [name], FNP   Subjective:    PATIENT ID: Sonia Sandoval GENDER: female DOB: Aug 30, 1950, MRN: [id number]       Chief Complaint  Patient presents with   Follow-up      Pt is here for follow up for DOE. Pt had full pfts done today. 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  Presents for evaluation of a right upper lobe cavitary lesion.  She denies cough sputum production.  No incarceration, no travel outside of the country.  No known exposures to TB.  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  She is here today for follow-up of this lung nodule.  She has a CT scan scheduled for Friday.  She was going to see me last fall but there was plans for changing of her insurance and so she pushed off the appointment until the following year.  And so she is here today to talk about this she does have a follow-up appointment with medical oncology next week.   OV 12/29/2021: Here today for follow-up.Last seen in the office by Barbaraann Barthel, NP.  Seen by Dr. Gale Journey from infectious disease.  Office note reviewed from yesterday.  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  Follow-up CT imaging was canceled because she had a CT completed in the emergency room on 12/07/2021.  This follow-up CT revealed slight interval decrease in the size of the cavity within the right upper lobe.  Doing well today.  Unfortunately had a recent drug reaction with skin manifestations.  Was stopped yesterday by one of her medicines.  She has follow-up with Dr. Gale Journey relating this.  She had PFTs completed prior to today's office visit which we reviewed today.  She does have a ratio  of 63 with an FEV1 of 67% predicted prebronchodilator and 68% change with postbronchodilator, TLC of 126%, RV of 141%, DLCO 125%.  She has history of secondhand smoke exposure from her parents and a history of allergies as a kid.  From a respiratory standpoint she occasionally gets short of breath with significant exertion but otherwise no other symptomatology.   Ov 05/17/2022: Here for bronchoscopy        Oncology History Overview Note   Cancer Staging Cancer of sigmoid colon [facility]) Staging form: Colon and Rectum, AJCC 8th Edition - Pathologic stage from 11/22/2019: Stage IIA (pT3, pN0, cM0) - Signed by [name], MD on 12/17/2019   Malignant neoplasm of lower-outer quadrant of right breast of female, estrogen receptor positive ([place]) Staging form: Breast, AJCC 8th Edition - Clinical: Stage IIB (cT2, cN1, cM0, G3, ER+, PR+, HER2-) - Unsigned - Pathologic: Stage IIA (pT2, pN1(sn), cM0, G3, ER+, PR+, HER2-) - Unsigned      Malignant neoplasm of lower-outer quadrant of right breast of female, estrogen receptor positive ([place])   10/23/2017 Initial Diagnosis      right breast lower outer quadrant biopsy 10/23/2017 for a clinical T2 N1, stage IIB invasive ductal carcinoma, grade 3, estrogen and progesterone receptor positive, HER-2 not amplified, with an MIB-1 of 20%     11/07/2017 Miscellaneous  Mammaprint on was read as low risk, predicting no significant chemotherapy benefit with a 5-year distant disease-free survival in the 97-98% range with hormone therapy alone     12/05/2017 Surgery     status post bilateral mastectomies on 12/05/2017 showing             (a) on the left, no evidence of malignancy             (b) on the right, a pT2 pN1, stage IIB invasive ductal carcinoma, grade 3, with negative margins. By Dr Brantley Stage     01/15/2018 - 02/28/2018 Radiation Therapy     adjuvant radiation 01/15/2018 - 02/28/2018 with Dr Lisbeth Renshaw              Site/dose: The patient initially received a dose of 50.4  Gy in 28 fractions to the chest wall and supraclavicular region. This was delivered using a 3-D conformal, 4 field technique. The patient then received a boost to the mastectomy scar. This delivered an additional 10 Gy in 5 fractions using an en face electron field. The total dose was 60.4 Gy.      04/01/2018 -  Anti-estrogen oral therapy     started anastrozole in 04/2018. Switched to Exemestane in 08/2020 due to joint pain.             (a) bone density 03/07/2018 with a T score of -1.8. Her 04/21/20 DEXA showed osteopenia -1.8 at right total femur, with low risk for fracture.     10/28/2019 Genetic Testing     Negative genetic testing.  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  The Common Hereditary Gene Panel offered by [facility] includes sequencing and/or deletion duplication testing of the following 48 genes: APC, ATM, AXIN2, BARD1, BMPR1A, BRCA1, BRCA2, BRIP1, CDH1, CDK4, CDKN2A (p14ARF), CDKN2A (p16INK4a), CHEK2, CTNNA1, DICER1, EPCAM (Deletion/duplication testing only), GREM1 (promoter region deletion/duplication testing only), KIT, MEN1, MLH1, MSH2, MSH3, MSH6, MUTYH, NBN, NF1, NHTL1, PALB2, PDGFRA, PMS2, POLD1, POLE, PTEN, RAD50, RAD51C, RAD51D, RNF43, SDHB, SDHC, SDHD, SMAD4, SMARCA4. STK11, TP53, TSC1, TSC2, and VHL.  The following genes were evaluated for sequence changes only: SDHA and HOXB13 c.251G>A variant only. The report date is October 28, 2019.     03/08/2021 Imaging     CT Chest w/o contrast   IMPRESSION: 1. The previous FDG avid lung mass within the anterior right lower lobe has decreased in size compared with previous exam compatible with response to therapy. The cavitary lung nodule within the right apex is slightly increased in size from previous exam. Additionally, there is a new irregular nodule within the medial right upper lobe measuring 6 mm. Previously 3 mm. 2. Interval increase in size of cavitary nodule within the right lung apex. This  measures 2.3 x 1.5 cm, compared with 1.3 x 0.9 cm previously. Underlying malignancy cannot be excluded. 3. New small nodule within the right upper lobe measures 6 mm. This has a nonspecific appearance but warrants attention on follow-up imaging. 4. The remaining small pulmonary nodules noted previously are stable in the interval. 5. Coronary artery calcifications noted. 6. Aortic Atherosclerosis (ICD10-I70.0).     Cancer of sigmoid colon ([place])   09/23/2019 Tumor Marker     Baseline CEA 1.4 on 09/23/19      09/23/2019 Procedure     Colonoscopy and Upper endoscopy with Dr Alessandra Bevels 09/23/19  Upper Endoscopy Impression - Z-line regular, 35 cm from the incisors. - Small hiatal hernia. - Non-bleeding gastric ulcer. Biopsied. -  Hematin (altered blood/coffee-ground-like material) in the gastric body. - Normal duodenal bulb, first portion of the duodenum and second portion of - Normal duodenal bulb, first portion of the duodenum and second portion of the duodenum.   Colonoscopy Impression - Preparation of the colon was poor. - Perianal skin tags found on perianal exam. - Likely malignant tumor in the recto-sigmoid colon. Biopsied. Tattooed. - Internal hemorrhoids.     09/23/2019 Initial Biopsy     FINAL MICROSCOPIC DIAGNOSIS: 09/23/19 A. STOMACH, ULCER, BIOPSY:  - Gastric antral mucosa with mild nonspecific reactive gastropathy  - Warthin Starry stain is negative for Helicobacter pylori    B. COLON, RECTOSIGMOID, MASS, BIOPSY:  - Adenocarcinoma, see comment  COMMENT:    B.  Immunohistochemical stains for MMR-related proteins are pending and  will be reported in an addendum. Dr. Saralyn Pilar reviewed the case and  concurs with the diagnosis. Dr. Alessandra Bevels was notified on 09/24/2019.        ADDENDUM:  Mismatch Repair Protein (IHC)  SUMMARY INTERPRETATION: NORMAL  There is preserved expression of the major MMR proteins. There is a very  low probability that microsatellite instability (MSI)  is present.  However, certain clinically significant MMR protein mutations may result  in preservation of nuclear expression. It is recommended that the  preservation of protein expression be correlated with molecular based  MSI testing.    IHC EXPRESSION RESULTS  TEST           RESULT  MLH1:          Preserved nuclear expression  MSH2:          Preserved nuclear expression  MSH6:          Preserved nuclear expression  PMS2:          Preserved nuclear expression      09/23/2019 Imaging     CT CAP W Contrast 09/23/19  IMPRESSION: 1. There is eccentric wall thickening of the rectosigmoid colon, in keeping with mass identified and biopsied by colonoscopy.   2. There are prominent subcentimeter lymph nodes in the sigmoid mesocolon, suspicious for nodal metastatic disease. There is no other lymphadenopathy in the abdomen or pelvis.   3. There are multiple small bilateral pulmonary nodules measuring up to 4 mm, nonspecific although very suspicious for pulmonary metastatic disease. Given history of breast malignancy, previous imaging of the chest would be very helpful to assess for stability. This report may be addended for comparison if prior imaging can be obtained.   4. Status post bilateral mastectomy with irregular subpleural opacity of the anterior right upper lobe, consistent with history of breast malignancy and radiation fibrosis.   5. There is adjacent nodularity of the right pulmonary apex, the largest discrete nodular component measuring up to 1.1 cm. This is more favored infectious or inflammatory although nonspecific and metastatic disease is not excluded. As as above, prior imaging would be very helpful to assess.   6. Small focus of nonspecific infectious or inflammatory airspace disease of the perihilar left upper lobe.     09/26/2019 Initial Diagnosis     Cancer of sigmoid colon ([place])     10/28/2019 PET scan     IMPRESSION: 1. Increased tracer uptake within the  pelvis corresponding to wall thickening of the rectosigmoid colon favored to represent primary colorectal carcinoma. No findings of FDG avid nodal metastasis or solid organ metastasis within the abdomen or pelvis. 2. Within the right lung apex there is a cluster of 3 subpleural nodular  densities which exhibit varying degrees of mild FDG uptake with SUV max between 1.9 and 3.1. Morphologically the appearance is suggestive of benign postinflammatory pleuroparenchymal scarring. This would be an atypical pattern of metastatic disease, which is considered less favored, but not excluded. Attention in this area on serial follow-up imaging is advised. 3. Two small, less than 5 mm left lower lobe lung nodules are too small to reliably characterize. 4.  Aortic Atherosclerosis (ICD10-I70.0). 5. Nonobstructing right renal calculus.       11/22/2019 Cancer Staging     Staging form: Colon and Rectum, AJCC 8th Edition - Pathologic stage from 11/22/2019: Stage IIA (pT3, pN0, cM0) - Signed by [name], MD on 12/17/2019     11/22/2019 Surgery     XI ROBOT ASSISTED SIGMOIDECTOMY and FLEXIBLE SIGMOIDOSCOPY by Dr white and Dr Marcello Moores     11/22/2019 Pathology Results     FINAL MICROSCOPIC DIAGNOSIS:   A. SIGMOID COLON, RESECTION:  - Invasive moderately differentiated adenocarcinoma, 4.5 cm, involving distal sigmoid colon  - Carcinoma focally invades into pericolonic soft tissue  - Resection margins are negative for carcinoma  - Negative for lymphovascular or perineural invasion  - Eighteen benign lymph nodes (0/18)  - See oncology table   B. FINAL DISTAL MARGIN, EXCISION:  - Colonic donut, negative for carcinoma      04/15/2020 Imaging     CT Chest  IMPRESSION: 1. New nodular 1.9 cm focus of consolidation in the anterior right lower lobe with surrounding mild patchy tree-in-bud opacity, nonspecific but favoring infectious/inflammatory etiology. 2. Clustered nodularity in the apical right upper lobe  is stable in size and newly cavitary. These findings are indeterminate for atypical infection such as MAI versus response to therapy of pulmonary metastases. Additional scattered tiny left pulmonary nodules are stable. Suggest close chest CT follow-up in 3 months. 3. No thoracic adenopathy. 4. Stable dilated main pulmonary artery, suggesting chronic pulmonary arterial hypertension. 5. Aortic Atherosclerosis (ICD10-I70.0).     04/17/2020 Tumor Marker     Her Guardant Reveal showed negative - ctDNA not detected.      07/08/2020 Imaging     CT Chest  IMPRESSION: Slight reduction in size of hypermetabolic right upper lobe pulmonary nodule, nonspecific though favored to represent area of infection/inflammation as was initially suggested on chest CT performed 04/15/2020. No biopsy attempted at this time.     03/08/2021 Imaging     CT Chest w/o contrast   IMPRESSION: 1. The previous FDG avid lung mass within the anterior right lower lobe has decreased in size compared with previous exam compatible with response to therapy. The cavitary lung nodule within the right apex is slightly increased in size from previous exam. Additionally, there is a new irregular nodule within the medial right upper lobe measuring 6 mm. Previously 3 mm. 2. Interval increase in size of cavitary nodule within the right lung apex. This measures 2.3 x 1.5 cm, compared with 1.3 x 0.9 cm previously. Underlying malignancy cannot be excluded. 3. New small nodule within the right upper lobe measures 6 mm. This has a nonspecific appearance but warrants attention on follow-up imaging. 4. The remaining small pulmonary nodules noted previously are stable in the interval. 5. Coronary artery calcifications noted. 6. Aortic Atherosclerosis (ICD10-I70.0).               Past Medical History:  Diagnosis Date   Allergies 1995    Per patient report 11/14/18   Anemia      prior to finding  colon cancer, no longer any issues with  this per patient   Breast cancer [facility])     Breast cancer, right breast ([place]) 09/2017    S/P mastectomy 12/05/2017   Colon cancer ([place])     COPD (chronic obstructive pulmonary disease) ([place])      pt reports previous MD diagnosed her with this, but takes no medications   Dyspnea      occasionally with exertion   Family history of breast cancer     Headache 1968    per patient report, started as a teenager   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   Osteopenia 03/2018    Seen on DEXA Scan   PONV (postoperative nausea and vomiting)             Family History  Problem Relation Age of Onset   Dementia Mother     Lung cancer Father          smoked   Emphysema Father     Heart disease Maternal Grandfather     Cancer Maternal Aunt 60        breast cancer    Cancer Cousin          maternal first cousin with breast cancer in her 30s           Past Surgical History:  Procedure Laterality Date   BACK SURGERY   2003    Patient will occassional back pain since 2003   BIOPSY   09/23/2019    Procedure: BIOPSY;  Surgeon: Otis Brace, MD;  Location: [facility];  Service: Gastroenterology;;  EGD and COLON   BREAST BIOPSY Right 09/2017   BRONCHIAL BIOPSY   08/31/2021    Procedure: BRONCHIAL BIOPSIES;  Surgeon: Garner Nash, DO;  Location: [place];  Service: Pulmonary;;   BRONCHIAL BRUSHINGS   08/31/2021    Procedure: BRONCHIAL BRUSHINGS;  Surgeon: Garner Nash, DO;  Location: [place];  Service: Pulmonary;;   BRONCHIAL NEEDLE ASPIRATION BIOPSY   08/31/2021    Procedure: BRONCHIAL NEEDLE ASPIRATION BIOPSIES;  Surgeon: Garner Nash, DO;  Location: [place];  Service: Pulmonary;;   BRONCHIAL WASHINGS   08/31/2021    Procedure: BRONCHIAL WASHINGS;  Surgeon: Garner Nash, DO;  Location: [place];  Service: Pulmonary;;   CESAREAN SECTION   1985; 1992    COLONOSCOPY WITH PROPOFOL N/A 09/23/2019    Procedure: COLONOSCOPY WITH PROPOFOL and EGD;  Surgeon: Otis Brace, MD;  Location: [facility];  Service: Gastroenterology;  Laterality: N/A;   DILATION AND CURETTAGE OF UTERUS       ESOPHAGOGASTRODUODENOSCOPY N/A 09/23/2019    Procedure: ESOPHAGOGASTRODUODENOSCOPY (EGD);  Surgeon: Otis Brace, MD;  Location: [facility];  Service: Gastroenterology;  Laterality: N/A;   FLEXIBLE SIGMOIDOSCOPY N/A 11/22/2019    Procedure: FLEXIBLE SIGMOIDOSCOPY;  Surgeon: [name], MD;  Location: [facility];  Service: General;  Laterality: N/A;   LUMBAR DISC SURGERY   2003   MASTECTOMY Left 12/05/2017    PROPHYLACTIC MASTECTOMY   MASTECTOMY COMPLETE / SIMPLE W/ SENTINEL NODE BIOPSY Right 12/05/2017    WITH RADIOACTIVE SEED TARGETED RIGHT AXIILARY LYMPH NODE EXCISION AND RIGHT SENTINEL LYMPH NODE BIOPSY,    MASTECTOMY WITH RADIOACTIVE SEED GUIDED EXCISION AND AXILLARY SENTINEL LYMPH NODE BIOPSY Bilateral 12/05/2017    Procedure: RIGHT SIMPLE MASTECTOMY WITH RADIOACTIVE SEED TARGETED RIGHT AXIILARY LYMPH NODE EXCISION AND  RIGHT SENTINEL LYMPH NODE BIOPSY, LEFT PROPHYLACTIC MASTECTOMY;  Surgeon: [name], MD;  Location: [place];  Service: General;  Laterality: Bilateral;   SUBMUCOSAL TATTOO INJECTION   09/23/2019    Procedure: SUBMUCOSAL TATTOO INJECTION;  Surgeon: Otis Brace, MD;  Location: [facility];  Service: Gastroenterology;;   VIDEO BRONCHOSCOPY WITH RADIAL ENDOBRONCHIAL ULTRASOUND   08/31/2021    Procedure: RADIAL ENDOBRONCHIAL ULTRASOUND;  Surgeon: Garner Nash, DO;  Location: [facility];  Service: Pulmonary;;      Social History         Socioeconomic History   Marital status: Married      Spouse name: Not on file   Number of children: 4   Years of education: Not on file   Highest education level: Not on file  Occupational History   Not on file  Tobacco Use   Smoking status: Never   Smokeless tobacco: Never  Vaping  Use   Vaping Use: Never used  Substance and Sexual Activity   Alcohol use: Yes      Alcohol/week: 14.0 standard drinks of alcohol      Types: 14 Glasses of wine per week      Comment: 2-3 3oz glasses of wine per day   Drug use: Never   Sexual activity: Not on file  Other Topics Concern   Not on file  Social History Narrative    12-19-17 Unable to ask abuse questions husband with her today.    Social Determinants of Health        Financial Resource Strain: Not on file  Food Insecurity: Not on file  Transportation Needs: No Transportation Needs (05/03/2018)    PRAPARE - Armed forces logistics/support/administrative officer (Medical): No     Lack of Transportation (Non-Medical): No  Physical Activity: Not on file  Stress: Not on file  Social Connections: Not on file  Intimate Partner Violence: Not on file           Allergies  Allergen Reactions   Codeine Nausea And Vomiting and Other (See Comments)      headache            Outpatient Medications Prior to Visit  Medication Sig Dispense Refill   AMBULATORY NON FORMULARY MEDICATION Take 100 mg by mouth daily. Medication Name: Clofazimine 100 capsule 1   azithromycin (ZITHROMAX) 500 MG tablet Take 1 tablet (500 mg total) by mouth daily. 90 tablet 3   Calcium Carbonate-Vitamin D (CALCIUM 600+D PO) Take 1 tablet by mouth daily.       Cholecalciferol (VITAMIN D) 50 MCG (2000 UT) tablet Take 2,000 Units by mouth daily.       gabapentin (NEURONTIN) 300 MG capsule Take 300 mg by mouth daily.       linezolid (ZYVOX) 600 MG tablet Take 1 tablet (600 mg total) by mouth daily. 30 tablet 2   OVER THE COUNTER MEDICATION Take 1 tablet by mouth daily. Juice plus fruit blend       OVER THE COUNTER MEDICATION Take 1 capsule by mouth daily. Juice plus berry blend       OVER THE COUNTER MEDICATION Take 1 capsule by mouth daily. Juice plus veggie blend       pantoprazole (PROTONIX) 40 MG tablet Take 40 mg by mouth every morning.       Probiotic Product  (PROBIOTIC PO) Take 2 capsules by mouth daily.       tiZANidine (ZANAFLEX) 4 MG tablet Take 4 mg by mouth  daily as needed for muscle spasms.       vitamin E 180 MG (400 UNITS) capsule Take 800 Units by mouth daily.       ALPRAZolam (XANAX) 0.5 MG tablet Take 0.5 mg by mouth daily as needed for sleep or anxiety. (Patient not taking: Reported on 09/15/2021)       exemestane (AROMASIN) 25 MG tablet TAKE 1 TABLET (25 MG TOTAL) BY MOUTH DAILY AFTER BREAKFAST. (Patient not taking: Reported on 09/15/2021) 90 tablet 3   Menthol-Methyl Salicylate (SALONPAS PAIN RELIEF PATCH) PTCH Apply 1 patch topically daily as needed (pain).       naproxen sodium (ALEVE) 220 MG tablet Take 220-440 mg by mouth daily as needed (pain).       Pseudoephedrine-Naproxen Na (ALEVE-D SINUS & HEADACHE PO) Take 1 tablet by mouth daily as needed (allergies).        No facility-administered medications prior to visit.      Review of Systems  Constitutional:  Negative for chills, fever, malaise/fatigue and weight loss.  HENT:  Negative for hearing loss, sore throat and tinnitus.   Eyes:  Negative for blurred vision and double vision.  Respiratory:  Positive for cough. Negative for hemoptysis, sputum production, shortness of breath, wheezing and stridor.   Cardiovascular:  Negative for chest pain, palpitations, orthopnea, leg swelling and PND.  Gastrointestinal:  Negative for abdominal pain, constipation, diarrhea, heartburn, nausea and vomiting.  Genitourinary:  Negative for dysuria, hematuria and urgency.  Musculoskeletal:  Negative for joint pain and myalgias.  Skin:  Negative for itching and rash.  Neurological:  Negative for dizziness, tingling, weakness and headaches.  Endo/Heme/Allergies:  Negative for environmental allergies. Does not bruise/bleed easily.  Psychiatric/Behavioral:  Negative for depression. The patient is not nervous/anxious and does not have insomnia.   All other systems reviewed and are negative.        Objective:   General appearance: 72 y.o., female, NAD, conversant  Eyes: anicteric sclerae, moist conjunctivae; no lid-lag; PERRLA, tracking appropriately HENT: NCAT; oropharynx, MMM Neck: Trachea midline; FROM, supple, lymphadenopathy, no JVD Lungs: CTAB, no crackles CV: RRR, S1, S2, no MRGs  Abdomen: Soft, non-tender; non-distended, BS present  Extremities: No peripheral edema, radial and DP pulses present bilaterally  Skin: Normal temperature, turgor and texture; no rash Psych: Appropriate affect Neuro: Alert and oriented to person and place, no focal deficit        BP (!) 149/81   Pulse 77   Temp 98.2 F (36.8 C) (Oral)   Resp 17   Ht '5\' 3"'$  (1.6 m)   Wt 67.1 kg   SpO2 98%   BMI 26.22 kg/m     Chest Imaging: CT chest November 2022: Right upper lobe cavitary lesion and smaller pulmonary nodules bilaterally.  Increased size of the nodule in the right lung. The patient's images have been independently reviewed by me.       Pulmonary Functions Testing Results:     Latest Ref Rng & Units 12/29/2021   10:50 AM  PFT Results  FVC-Pre L 3.07  P  FVC-Predicted Pre % 105  P  FVC-Post L 3.30  P  FVC-Predicted Post % 113  P  Pre FEV1/FVC % % 48  P  Post FEV1/FCV % % 76  P  FEV1-Pre L 1.48  P  FEV1-Predicted Pre % 67  P  FEV1-Post L 2.50  P  DLCO uncorrected ml/min/mmHg 23.71  P  DLCO UNC% % 125  P  DLCO corrected ml/min/mmHg 24.58  P  DLCO COR %Predicted % 130  P  DLVA Predicted % 118  P  TLC L 6.18  P  TLC % Predicted % 126  P  RV % Predicted % 141  P    P Preliminary result      FeNO: none   Pathology: none   Echocardiogram: none   Heart Catheterization: none    Assessment & Plan:   Cavitary lung lesion   Discussion:   This is a 72 year old female, evaluation of a right upper lobe cavitary lesion taken for bronchoscopy ultimately diagnosed with Mycobacterium abscessus.  Has a history of breast cancer and colon cancer.  She is a lifelong non-smoker but  did have significant secondhand smoke exposure to her parents.  Recent PFTs completed today with evidence of obstruction that is fully reversible with bronchodilation as well as evidence of air trapping and an elevated DLCO.   Plan: Planned navigationalbronchoscopy  Discussed risks benefits and alternatives  No concerns.    Garner Nash, DO [facility] 05/17/2022 7:37 AM

## 2022-05-17 NOTE — Anesthesia Procedure Notes (Signed)
Procedure Name: Intubation Date/Time: 05/17/2022 7:53 AM  Performed by: Carolan Clines, CRNAPre-anesthesia Checklist: Patient identified, Emergency Drugs available, Suction available and Patient being monitored Patient Re-evaluated:Patient Re-evaluated prior to induction Oxygen Delivery Method: Circle System Utilized Preoxygenation: Pre-oxygenation with 100% oxygen Induction Type: IV induction Ventilation: Mask ventilation without difficulty Laryngoscope Size: Mac and 3 Grade View: Grade I Tube type: Oral Tube size: 8.5 mm Number of attempts: 1 Airway Equipment and Method: Stylet Placement Confirmation: ETT inserted through vocal cords under direct vision, positive ETCO2 and breath sounds checked- equal and bilateral Secured at: 22 cm Tube secured with: Tape Dental Injury: Teeth and Oropharynx as per pre-operative assessment

## 2022-05-17 NOTE — Discharge Instructions (Signed)

## 2022-05-18 ENCOUNTER — Ambulatory Visit: Payer: Commercial Managed Care - PPO | Admitting: Hematology

## 2022-05-18 ENCOUNTER — Encounter (HOSPITAL_COMMUNITY): Payer: Self-pay | Admitting: Pulmonary Disease

## 2022-05-18 ENCOUNTER — Other Ambulatory Visit: Payer: Commercial Managed Care - PPO

## 2022-05-19 LAB — CULTURE, BAL-QUANTITATIVE W GRAM STAIN
Culture: NO GROWTH
Gram Stain: NONE SEEN

## 2022-05-19 LAB — ACID FAST SMEAR (AFB, MYCOBACTERIA)
Acid Fast Smear: NEGATIVE
Acid Fast Smear: NEGATIVE

## 2022-05-22 LAB — AEROBIC/ANAEROBIC CULTURE W GRAM STAIN (SURGICAL/DEEP WOUND)
Culture: NO GROWTH
Culture: NO GROWTH
Gram Stain: NONE SEEN
Gram Stain: NONE SEEN

## 2022-06-06 ENCOUNTER — Telehealth: Payer: Self-pay | Admitting: Pulmonary Disease

## 2022-06-06 NOTE — Telephone Encounter (Signed)
I spoke to the pt and she didn't know if it was a form or report her surgeon needed for her surgery. I went to check the main fax and didn't receive anything yet from Dr Gale Journey office. Pt state she would call Dr Hart Rochester office again and have them fax necessary paperwork so pt can have hip surgery. I informed pt I'll be on the lookout for the paperwork. Pt verbalized understanding. Nothing further needed for now.

## 2022-06-06 NOTE — Telephone Encounter (Signed)
ATC LMTCB 06/06/22 @ 9:59 Am

## 2022-06-06 NOTE — Telephone Encounter (Signed)
No I don't have anything yet. Where is she getting the surgery at I can try to call and see if I can get the form faxed over to me and I can look at it.   Thank you

## 2022-06-07 LAB — CULTURE, FUNGUS WITHOUT SMEAR

## 2022-06-10 ENCOUNTER — Telehealth: Payer: Self-pay | Admitting: Pulmonary Disease

## 2022-06-10 NOTE — Telephone Encounter (Signed)
Fax received from Dr. Georgeanna Harrison with [facility] to perform a RIGHT ANTERIOR HIP ARTHROPLASTY WITH SPINAL ANESTHESIA on patient.  Patient needs surgery clearance. Surgery is PENDING. Patient was seen on 12/29/2021. Office protocol is a risk assessment can be sent to surgeon if patient has been seen in 60 days or less.   Sending to Dr Valeta Harms for risk assessment or recommendations if patient needs to be seen in office prior to surgical procedure.    Called patient and she states that she is doing great and has no issues with her breathing. She asked if it was really necessary for her to come in for follow up given she was cleared to do a 1 year follow up.

## 2022-06-13 NOTE — Telephone Encounter (Signed)
OV notes and clearance form have been faxed back to [facility]. Nothing further needed at this time.  

## 2022-06-14 ENCOUNTER — Encounter: Payer: Self-pay | Admitting: Pulmonary Disease

## 2022-06-16 ENCOUNTER — Encounter: Payer: Self-pay | Admitting: Internal Medicine

## 2022-06-16 ENCOUNTER — Telehealth: Payer: Self-pay

## 2022-06-16 ENCOUNTER — Other Ambulatory Visit: Payer: Self-pay

## 2022-06-16 ENCOUNTER — Ambulatory Visit (INDEPENDENT_AMBULATORY_CARE_PROVIDER_SITE_OTHER): Payer: Commercial Managed Care - PPO | Admitting: Internal Medicine

## 2022-06-16 ENCOUNTER — Encounter: Payer: Self-pay | Admitting: Hematology

## 2022-06-16 ENCOUNTER — Other Ambulatory Visit (HOSPITAL_COMMUNITY): Payer: Self-pay

## 2022-06-16 VITALS — BP 135/86 | HR 88 | Temp 98.2°F | Wt 148.0 lb

## 2022-06-16 DIAGNOSIS — A318 Other mycobacterial infections: Secondary | ICD-10-CM | POA: Diagnosis not present

## 2022-06-16 LAB — FUNGUS CULTURE WITH STAIN

## 2022-06-16 LAB — FUNGUS CULTURE RESULT

## 2022-06-16 LAB — FUNGAL ORGANISM REFLEX

## 2022-06-16 NOTE — Progress Notes (Signed)
[facility]  Cc - m-abscessus lung    Patient Active Problem List   Diagnosis Date Noted   Cavitary lesion of lung 08/08/2021   S/P laparoscopic-assisted sigmoidectomy 11/22/2019   Genetic testing 10/30/2019   Family history of breast cancer    Cancer of sigmoid colon ([place]) 09/26/2019   Iron deficiency anemia due to chronic blood loss 09/26/2019   Symptomatic anemia 09/21/2019   GI bleed 09/20/2019   Malignant neoplasm of lower-outer quadrant of right breast of female, estrogen receptor positive ([place]) 10/27/2017    Cc -- reason for consult = ntm cavitary lung disease  HPI: Sonia Sandoval is a 72 y.o. 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 Was supposed to be seen several months prior but didn't show.    5/02 underwent bronch: "The distinct navigation pathways prepared prior to this procedure were then utilized to navigate to patient's lesion identified on CT scan. The robotic catheter was secured into place and the vision probe was withdrawn.  Lesion location was approximated using fluoroscopy and radial endobronchial ultrasound for peripheral targeting. Under fluoroscopic guidance transbronchial needle brushings, transbronchial needle biopsies, and transbronchial forceps biopsies were performed to be sent for cytology and pathology. A bronchioalveolar lavage was performed in the right upper lobe and sent for microbiology.  Additional transbronchial biopsies sent for tissue culture.   At the end of the  procedure a general airway inspection was performed and there was no evidence of active bleeding. The bronchoscope was removed.  The patient tolerated the procedure well. There was no significant blood loss and there were no obvious complications. A post-procedural chest x-ray is pending."  Cytology didn't see cancer cells  On micro -- something is growing that looks like ntm and the [place] technology calls it abscessus but it was sent on 5/8th to reference lab to identify   Patient without respiratory sx outside of stable mild dyspnea on exertion 4-5 months. She would encounter this walking up a hill No b sx Has watery eyes and a recent dry cough but query if it's allergy No reflux No decreased appetite No weight loss   11/04/21 id clinic f/u Patient is back for f/u post susceptibility testing which show azithromycin/amikacin sensitive m abscessus She continues to feel well without b sx, cough, dyspnea, chest pain She is accompanied by her significant others today  12/28/21 id clinic f/u Patient developed itchy rash about 10 days ago involving trunk/ext. Takes benadryl and topical hydrocortisone. 2 benadryls to help sleep. Also takes claritin. Rash is getting worse Opat labs elevated lft but improving (moderate in 100s), without eosinophilia Amikacin trough 8/24 is 0.8 with creatinine 0.98 No fever chill Occasional cough Went to ed 8/08 for sob after extreme exertion and chest ct was done -- no pe. Improved cavitation.  No joint pain/muscle aching No new vision change; she endorses having cataracts Has chronic leg cramps which she has been using tonic water for 6 months. Question of some tingling in hands/feet very mild  At some point she  has recurrent elevation ak trough and cr and we had planned to substitute amikacin with linezolid but she done well as above with AK....  01/21/22 id clinic f/u Cefoxitin was hold first week of 12/2021 Her rash is much better She does at one point had  yeast vaginal infection and thrush about 2 weeks ago (she got fluconazole for vaginal yeast dose and some other topical gel otc for her mouth). She still feel some taste change on her tongue/feels raw as well with hot temperature especially No visual changes No numbness/tingling hands feet Current regimen: Amikacin Azith Clofazimine Linezolid (Doxy) Waiting for omadocycline approval again from insurance No n/v/diarrhea Rare cough Stable short of breath  She takes b6 daily for a while now  She had submitted 3 afb sputum but said the sputum are mostly upper oral. She can't get sputum up   02/08/22 id clinic f/u Patient last took abx a week ago. She was on tigycline at that time and had severe intolerable nausea/vomiting The doxycycline is out of the question is it should never be used to m-abscessus  She is ready to start again  03/16/22 id clinic f/u Patient started on amikacin, omado, clofaz, and azithromycin within a few days of 02/08/22 visit She is doing fine on those without any side effect Cough minimal/stable No b sx No weight loss -- weight gain actually No dyspnea Will have body ct to f/u breast cancer this December Can't get sputum up    06/16/22 id clinic f/u Had bronch mid jan and afb cx negative Chest ct 04/2022 showed stable cavitary changes sign but had become confluent No f/c/nightsweat, cough Not able to get up any sputum Reviewed opat labs. Normal creatinine and crp No n/v/diarrhea No rash  Skin is more pigmented and she asked why   Review of Systems: ROS All other ros negative      Past Medical History:  Diagnosis Date   Allergies 1995   Per patient report 11/14/18   Anemia    prior to finding colon cancer, no longer any issues with this per patient   Anxiety    Uses for sleep and long car rides   Breast cancer [facility])    Breast cancer, right breast ([place]) 09/2017   S/P mastectomy 12/05/2017   Colon cancer ([place])    COPD (chronic  obstructive pulmonary disease) ([place])    pt reports previous MD diagnosed her with this, but takes no medications   Dyspnea    occasionally with exertion   Family history of breast cancer    Headache 1968   per patient report, started as a teenager   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   Osteopenia 03/2018   Seen on DEXA Scan   PONV (postoperative nausea and vomiting)     Social History   Tobacco Use   Smoking status: Never   Smokeless tobacco: Never  Vaping Use   Vaping Use: Never used  Substance Use Topics   Alcohol use: Yes    Alcohol/week: 14.0 standard drinks of alcohol    Types: 14 Glasses of wine per week    Comment: 2-3 3oz glasses of wine per day   Drug use: Never    Family History  Problem Relation Age of Onset   Dementia Mother    Lung cancer Father        smoked  Emphysema Father    Heart disease Maternal Grandfather    Cancer Maternal Aunt 52       breast cancer    Cancer Cousin        maternal first cousin with breast cancer in her 69s    Allergies  Allergen Reactions   Tigecycline Diarrhea and Nausea And Vomiting   Codeine Nausea And Vomiting and Other (See Comments)    headache    OBJECTIVE: Vitals:   06/16/22 1521  BP: 135/86  Pulse: 88  Temp: 98.2 F (36.8 C)  TempSrc: Oral  SpO2: 95%  Weight: 148 lb (67.1 kg)   Body mass index is 26.22 kg/m.   Physical Exam General/constitutional: no distress, pleasant HEENT: Normocephalic, PER, Conj Clear, EOMI, Oropharynx clear Neck supple CV: rrr no mrg Lungs: clear to auscultation, normal respiratory effort Abd: Soft, Nontender Ext: no edema Skin: No Rash - per patient more pigmented although I can't tell Neuro: nonfocal MSK: no peripheral joint swelling/tenderness/warmth; back spines nontender    Central line -- lue picc site no  erythema/tenderness     Lab: Reviewed opat labs  05/30/22  WBC 4.1, SCr 0.84, CRP 1, amk tough <0.8  Lab Results  Component Value Date   WBC 2.7 (L) 05/17/2022   HGB 10.1 (L) 05/17/2022   HCT 30.4 (L) 05/17/2022   MCV 90.2 05/17/2022   PLT 209 [id number]   Last metabolic panel Lab Results  Component Value Date   GLUCOSE 95 05/17/2022   NA 139 05/17/2022   K 3.8 05/17/2022   CL 107 05/17/2022   CO2 22 05/17/2022   BUN 16 05/17/2022   CREATININE 0.77 05/17/2022   GFRNONAA >60 05/17/2022   CALCIUM 8.8 (L) 05/17/2022   PHOS 3.6 11/14/2018   PROT 6.2 (L) 04/13/2022   ALBUMIN 4.1 04/13/2022   BILITOT 0.4 04/13/2022   ALKPHOS 53 04/13/2022   AST 23 04/13/2022   ALT 18 04/13/2022   ANIONGAP 10 05/17/2022    Microbiology:  Serology:  Imaging: I have reviewed the imaging personally and incorporated into decision making  08/06/21 chest abd pelv ct with contrast FINDINGS: CT CHEST FINDINGS   Cardiovascular: Aortic atherosclerosis. Normal heart size. No pericardial effusion.   Mediastinum/Nodes: No enlarged mediastinal, hilar, or axillary lymph nodes. Thyroid gland, trachea, and esophagus demonstrate no significant findings.   Lungs/Pleura: Interval increase in wall thickening and cavitation within the right pulmonary apex, overall lesion now confluent and measuring 3.8 x 3.1 cm (series 4, image 24). Increased clustered centrilobular nodularity and small consolidations in this vicinity (series 4, image 37). Residual bandlike scarring in the bilateral lung bases (series 4, image 109, 122). Occasional small nodules of the left lower lobe measuring up to 0.5 cm (series 4, image 21). No pleural effusion or pneumothorax.   Musculoskeletal: No chest wall mass or suspicious osseous lesions identified. Status post bilateral mastectomy.   CT ABDOMEN PELVIS FINDINGS   Hepatobiliary: No solid liver abnormality is seen. No gallstones, gallbladder wall thickening, or  biliary dilatation.   Pancreas: Unremarkable. No pancreatic ductal dilatation or surrounding inflammatory changes.   Spleen: Normal in size without significant abnormality.   Adrenals/Urinary Tract: Adrenal glands are unremarkable. Punctuate nonobstructive calculus in the inferior pole of the right kidney (series 5, image 89). No left-sided calculi, ureteral calculi, or hydronephrosis. Bladder is unremarkable.   Stomach/Bowel: Stomach is within normal limits. Appendix is not clearly visualized and may be surgically absent. The cecum is anterior to the liver. Status post sigmoid colon  resection and reanastomosis. No evidence of bowel wall thickening, distention, or inflammatory changes.   Vascular/Lymphatic: Aortic atherosclerosis. No enlarged abdominal or pelvic lymph nodes.   Reproductive: No mass or other abnormality.   Other: No abdominal wall hernia or abnormality. No ascites.   Musculoskeletal: No acute osseous findings.   IMPRESSION: 1. Status post sigmoid colon resection and reanastomosis. No evidence of lymphadenopathy or metastatic disease in the chest, abdomen, or pelvis. 2. Interval increase in wall thickening and cavitation within the right pulmonary apex, overall lesion now confluent and measuring 3.8 x 3.1 cm. Increased clustered centrilobular nodularity and small consolidations in this vicinity. Findings are consistent with ongoing, worsened cavitary infection. 3. Additional small nodules of the left lower lobe measuring up to 0.5 cm, unchanged and almost certainly benign, incidental sequelae of infection inflammation. 4. Nonobstructive right nephrolithiasis.   12/07/21  chest cta 1. No pulmonary embolism. No acute intrathoracic pathology identified. 2. Slight interval decrease in size of a cavitary lesion within the right upper lobe in keeping with at least partial response to therapy. Repeat imaging in 6-12 months would be helpful in confirming  stability or resolution exclude the possibility of an underlying cavitating malignancy 3. Stable 5 mm subpleural pulmonary nodule within the left lower lobe, safely considered benign.   Assessment/plan: Problem List Items Addressed This Visit   None Visit Diagnoses     Mycobacterium abscessus infection    -  Primary   Relevant Orders   CT CHEST WO CONTRAST     Abx: 10/10 - c iv amikacin, omado; po clofaz, azith   (9/7-10/03 doxy) 9/6-10/03 linezolid 7/6-10/03 amikacin 7/6-10/03 azith 7/6-10/03 clofazimine  7/6-9/07 cefoxitin    Discussed natural history, epidimiology, pathogenesis, diagnosis criteria and treatment indication for ntm lungs  Discussed resistant nature of abscessus, cure rate, relapse rate  At this time she is completely assymptomatic but imaging has indicated progression of cavitary lesion. On cytology there was no malignant cells. She had hx of colon cancer and breast cancer at least 3 years prior to this admission, and all are in remission  At this time culture is still pending final ID confirmation and susceptibility testing  I would treat her based on progression of ct scan  Will await susceptibility and follow up with her in around 4-6 weeks to decide on treatment regimen    7/6 assessment Doing well Due to cavitary progression will do 2 iv's and 2 po's  Initial regimen will be iv amikacin, cefoxitin and po clofazimine and azithromycin  The initial regimen will be at least 1-3 months as long as she can tolerate.  After the 3 months potentially we can keep 1 iv and the 2 PO's or transition her iv's to inhaled amikacin with the 2 po's  Our pharmacist team speaks with her today. Will start [place] and azith today  Once picc is in she can start the cefoxitin continuous daily infusion and tiw amikacin   Repeat chest ct in 6-8 weeks Repeat afb sputum x3 1 month after starting medication   8/29 assessment Ct improved on 8/8 Rash ?cefoxitin.  No sign of DRESS or arthus reaction otherwise. Will look in to the newer tetracycline along with bedaquilline for her. Can continue as is for now her current drug regimen 3 sputum early morning ordered for afb testing -- discussed to do it before eating/drinking/brushing/gargling Repeat chest ct in 3-6 months   Discussed with our id pharmacy team   ----------- 01/21/22 id assessment For some reason insurance  is making it difficult in getting omadocycline approved. They want her to try doxycycline first  There is no evidence doxycycline works for [employer], and omadocycline or tigecycline would need to be the back bone of treatment (tigecycline of course much more adverse side effect).  Our pharmacist is trying to get this approved with insurance. Twice now, awaiting result  She is on amikacin, azith, clofazimine, linezolid now along with doxy (as requested by the insurance).  Cefoxitin stopped due to rash thought to be related to it.  She is tolerating it  Follow up with me in 4-6 weeks  I will ask dr Valeta Harms about repeating a bronch in 3-6 months to get sputum again as she can't get good sputum by herself. I am not confident I can trust her expectorated sputum for sterility check  Will plan on repeat chest ct in another 3 months and see if dr Valeta Harms could help get some sputum in 3-6 months  02/08/22 id assessment Patient ready to start abx again. Couldn't tolerate tigecycline Iv omadocycline is covered by her insurance. They had several times unreasonably and illegally denied oral omadocycline  Will resume amikacin and omadocycline iv, with oral azithromycin and clofazimine  Plan 3 months iv then transition to oral hopefully by that time her sputum has sterilization of m-abscessus (so around 05/2022)  03/16/22 id assessment Patient to arrange body ct with her oncologist this December She'll be at 3 months of current regimen for m-abscessus by 05/2022 which I think she  should have sputum assessment again I'll ask dr Valeta Harms to do bronchoscopy to get afb sputum culture at that time (around end of December or early 05/2022 F/u with me after 4-6 weeks after bronchoscopy done so we can decide if we need to drop one of the iv antibiotics   06/16/22 id assessment Doing well Ct chest would say fairly stable 05/2022 bronch sample afb culture negative She can't get sputum so will have to repeat bronch in 08/2022 and need at least a year of negative sample before we can stop all antibiotics  For now we can stop amikacin Will continue iv omado, oral clofaz and azithromycin  Will check with our pharmacy team to see if transitioning from iv to PO omadocycline an option (insurance issue). And will also see if inhaled amikacin liposomal an option  Will let dr icard know to bronch er in 08/2022 Chest ct in 4-6 weeks  See me 4 weeks after bronchoscopy  I have spent a total of 40 minutes of face-to-face and non-face-to-face time, excluding clinical staff time, preparing to see patient, ordering tests and/or medications, and provide counseling the patient      Follow-up: Return in about 3 months (around 09/14/2022).  Jabier Mutton, [facility] 06/16/2022, 3:37 PM

## 2022-06-16 NOTE — Telephone Encounter (Signed)
[facility] Patient Advocate Encounter   Received notification from [employer] that prior authorization for Elesa Hacker is required.   PA submitted on 06/16/22 Key [id number] Status is pending  PA was done on [employer].    [facility] will continue to follow.   Ileene Patrick, [facility] Patient [facility] for Infectious Disease Phone: [phone] Fax:  [phone]

## 2022-06-16 NOTE — Telephone Encounter (Signed)
[facility] Patient Advocate Encounter   Received notification from [employer] that prior authorization for Arikayce is required.   PA submitted on 06/16/22 Key [id number] Status is pending  PA was done on [facility] will continue to follow.   Ileene Patrick, [facility] Patient [facility] for Infectious Disease Phone: [phone] Fax:  [phone]

## 2022-06-16 NOTE — Patient Instructions (Signed)
Stop amikacin today   Continue iv omadocycline, oral azithromycin and oral clofazimine   Will work with our pharmacy about pill form of omadocycline and inhaled amikacin   Will let dr Valeta Harms know you need another bronch 08/2022 and you'll need chest ct in 4-6 weeks   See me 3-4 weeks after bronch

## 2022-06-17 NOTE — Progress Notes (Signed)
Thanks Brad   

## 2022-06-21 ENCOUNTER — Other Ambulatory Visit (HOSPITAL_COMMUNITY): Payer: Self-pay

## 2022-06-21 ENCOUNTER — Telehealth: Payer: Self-pay

## 2022-06-21 NOTE — Telephone Encounter (Signed)
[facility] Patient Advocate Encounter  Prior Authorization for Viona Gilmore has been approved.    PA# [id number] Effective dates: 06/20/22 through 12/19/22  Patients co-pay is $0.00.   [facility] Clinic will continue to follow.  Ileene Patrick, [facility] Patient [facility] for Infectious Disease Phone: [phone] Fax:  [phone]

## 2022-06-22 ENCOUNTER — Telehealth: Payer: Self-pay | Admitting: Pharmacist

## 2022-06-22 NOTE — Telephone Encounter (Signed)
Patient picked up 2 bottles of clofazimine on 06/09/22. Supply should last for ~3 months. Next refill due around middle of May.  Friend Dorfman L. Leaira Fullam, PharmD, BCIDP, AAHIVP, CPP Clinical Pharmacist Practitioner [place] for Infectious Disease 06/22/2022, 10:59 AM

## 2022-06-23 ENCOUNTER — Other Ambulatory Visit: Payer: Self-pay | Admitting: Orthopedic Surgery

## 2022-06-24 ENCOUNTER — Telehealth: Payer: Self-pay

## 2022-06-24 NOTE — Telephone Encounter (Signed)
Appeal sent for Nuzyra PO tablets. Awaiting response.

## 2022-06-24 NOTE — Telephone Encounter (Signed)
Sending to you all to follow up. Thanks!

## 2022-06-28 NOTE — Progress Notes (Signed)
Surgery orders requested via Epic inbox. °

## 2022-06-29 ENCOUNTER — Telehealth: Payer: Self-pay | Admitting: Pharmacist

## 2022-06-29 NOTE — Telephone Encounter (Signed)
Insurance denied [name] appeal due to needing culture/sensitivities which were faxed back to them today. Will await response.  Alfonse Spruce, PharmD, CPP, BCIDP, [place] Clinical Pharmacist Practitioner Infectious [place] for Infectious Disease

## 2022-06-30 ENCOUNTER — Telehealth: Payer: Self-pay | Admitting: Pharmacist

## 2022-06-30 ENCOUNTER — Other Ambulatory Visit (HOSPITAL_COMMUNITY): Payer: Self-pay

## 2022-06-30 LAB — ACID FAST CULTURE WITH REFLEXED SENSITIVITIES (MYCOBACTERIA)
Acid Fast Culture: NEGATIVE
Acid Fast Culture: NEGATIVE

## 2022-06-30 NOTE — Telephone Encounter (Signed)
Tia from [employer] called requesting antibiotic end date plans as they have extension set to 3/4. Reviewed that we are still pending oral Nuzyra approval, so extended to 3/18 unless we receive approval prior to then per Dr. Gale Journey.  Alfonse Spruce, PharmD, CPP, BCIDP, [place] Clinical Pharmacist Practitioner Infectious Diseases [place] for Infectious Disease

## 2022-07-04 NOTE — Progress Notes (Signed)
RUE restriction and pt has PICC to left arm  COVID Vaccine Completed:yes  Date of COVID positive in last 90 days: no  PCP - Nicholes Rough, PA Cardiologist -  Pulmonologist- Dr. Valeta Harms  Medical clearance by [name] 04/28/22 in Epic  Clearance by Dr. Valeta Harms?  Chest x-ray - 05/17/22 Epic EKG - 12/08/21 Epic Stress Test - years ago per pt ECHO - n/a Cardiac Cath - n/a Pacemaker/ICD device last checked: n/a Spinal Cord Stimulator: n/a  Bowel Prep - no  Sleep Study - n/a CPAP -   Fasting Blood Sugar - n/a Checks Blood Sugar _____ times a day  Last dose of GLP1 agonist-  N/A GLP1 instructions:  N/A   Last dose of SGLT-2 inhibitors-  N/A SGLT-2 instructions: N/A   Blood Thinner Instructions: n/a Aspirin Instructions: Last Dose:  Activity level: Can go up a flight of stairs and perform activities of daily living without stopping and without symptoms of chest pain. SOB with activity      Anesthesia review: lung lesion, copd, sob  Patient denies shortness of breath, fever, cough and chest pain at PAT appointment  Patient verbalized understanding of instructions that were given to them at the PAT appointment. Patient was also instructed that they will need to review over the PAT instructions again at home before surgery.

## 2022-07-05 ENCOUNTER — Encounter (HOSPITAL_COMMUNITY)
Admission: RE | Admit: 2022-07-05 | Discharge: 2022-07-05 | Disposition: A | Discharge status: Home / Self Care | Payer: Commercial Managed Care - PPO | Source: Ambulatory Visit | Attending: Orthopedic Surgery | Admitting: Orthopedic Surgery

## 2022-07-05 ENCOUNTER — Ambulatory Visit (HOSPITAL_COMMUNITY): Payer: Commercial Managed Care - PPO

## 2022-07-05 ENCOUNTER — Encounter (HOSPITAL_COMMUNITY): Payer: Self-pay

## 2022-07-05 VITALS — BP 140/83 | HR 83 | Resp 16 | Ht 63.0 in | Wt 147.0 lb

## 2022-07-05 DIAGNOSIS — Z85038 Personal history of other malignant neoplasm of large intestine: Secondary | ICD-10-CM | POA: Diagnosis not present

## 2022-07-05 DIAGNOSIS — Z853 Personal history of malignant neoplasm of breast: Secondary | ICD-10-CM | POA: Diagnosis not present

## 2022-07-05 DIAGNOSIS — Z01812 Encounter for preprocedural laboratory examination: Secondary | ICD-10-CM | POA: Insufficient documentation

## 2022-07-05 DIAGNOSIS — R942 Abnormal results of pulmonary function studies: Secondary | ICD-10-CM | POA: Insufficient documentation

## 2022-07-05 DIAGNOSIS — M1611 Unilateral primary osteoarthritis, right hip: Secondary | ICD-10-CM | POA: Insufficient documentation

## 2022-07-05 DIAGNOSIS — D500 Iron deficiency anemia secondary to blood loss (chronic): Secondary | ICD-10-CM

## 2022-07-05 DIAGNOSIS — J984 Other disorders of lung: Secondary | ICD-10-CM | POA: Diagnosis not present

## 2022-07-05 DIAGNOSIS — Z01818 Encounter for other preprocedural examination: Secondary | ICD-10-CM

## 2022-07-05 HISTORY — DX: Mycobacterial infection, unspecified: A31.9

## 2022-07-05 HISTORY — DX: Unspecified osteoarthritis, unspecified site: M19.90

## 2022-07-05 LAB — CBC
HCT: 34.1 % — ABNORMAL LOW (ref 36.0–46.0)
Hemoglobin: 10.8 g/dL — ABNORMAL LOW (ref 12.0–15.0)
MCH: 30.3 pg (ref 26.0–34.0)
MCHC: 31.7 g/dL (ref 30.0–36.0)
MCV: 95.5 fL (ref 80.0–100.0)
Platelets: 240 10*3/uL (ref 150–400)
RBC: 3.57 MIL/uL — ABNORMAL LOW (ref 3.87–5.11)
RDW: 14.6 % (ref 11.5–15.5)
WBC: 3.9 10*3/uL — ABNORMAL LOW (ref 4.0–10.5)
nRBC: 0 % (ref 0.0–0.2)

## 2022-07-05 LAB — BASIC METABOLIC PANEL
Anion gap: 6 (ref 5–15)
BUN: 14 mg/dL (ref 8–23)
CO2: 22 mmol/L (ref 22–32)
Calcium: 8.6 mg/dL — ABNORMAL LOW (ref 8.9–10.3)
Chloride: 109 mmol/L (ref 98–111)
Creatinine, Ser: 0.94 mg/dL (ref 0.44–1.00)
GFR, Estimated: >60 mL/min (ref >60)
Glucose, Bld: 119 mg/dL — ABNORMAL HIGH (ref 70–99)
Potassium: 3.7 mmol/L (ref 3.5–5.1)
Sodium: 137 mmol/L (ref 135–145)

## 2022-07-05 LAB — SURGICAL PCR SCREEN
MRSA, PCR: NEGATIVE
Staphylococcus aureus: NEGATIVE

## 2022-07-05 NOTE — Patient Instructions (Addendum)
SURGICAL WAITING ROOM VISITATION  Patients having surgery or a procedure may have no more than 2 support people in the waiting area - these visitors may rotate.    Children under the age of 51 must have an adult with them who is not the patient.  Due to an increase in RSV and influenza rates and associated hospitalizations, children ages 59 and under may not visit patients in [place].  If the patient needs to stay at the hospital during part of their recovery, the visitor guidelines for inpatient rooms apply. Pre-op nurse will coordinate an appropriate time for 1 support person to accompany patient in pre-op.  This support person may not rotate.    Please refer to the [facility] website for the visitor guidelines for Inpatients (after your surgery is over and you are in a regular room).    Your procedure is scheduled on: 07/07/22   Report to [facility] Main Entrance    Report to admitting at 5:15 AM   Call this number if you have problems the morning of surgery [phone]   Do not eat food :After Midnight.   After Midnight you may have the following liquids until 4:30 AM DAY OF SURGERY  Water Non-Citrus Juices (without pulp, NO RED-Apple, White grape, White cranberry) Black Coffee (NO MILK/CREAM OR CREAMERS, sugar ok)  Clear Tea (NO MILK/CREAM OR CREAMERS, sugar ok) regular and decaf                             Plain Jell-O (NO RED)                                           Fruit ices (not with fruit pulp, NO RED)                                     Popsicles (NO RED)                                                               Sports drinks like Gatorade (NO RED)               The day of surgery:  Drink ONE (1) Pre-Surgery Clear Ensure at 4:30 AM the morning of surgery. Drink in one sitting. Do not sip.  This drink was given to you during your hospital  pre-op appointment visit. Nothing else to drink after completing the  Pre-Surgery Clear Ensure.           If you have questions, please contact your surgeon's office.   FOLLOW BOWEL PREP AND ANY ADDITIONAL PRE OP INSTRUCTIONS YOU RECEIVED FROM YOUR SURGEON'S OFFICE!!!     Oral Hygiene is also important to reduce your risk of infection.                                    Remember - BRUSH YOUR TEETH THE MORNING OF SURGERY WITH YOUR REGULAR TOOTHPASTE  DENTURES WILL BE REMOVED  PRIOR TO SURGERY PLEASE DO NOT APPLY "Poly grip" OR ADHESIVES!!!   Take these medicines the morning of surgery with A SIP OF WATER: Inhalers, Alprazolam, Azithromycin, Gabapentin, Pantoprazole                              You may not have any metal on your body including hair pins, jewelry, and body piercing             Do not wear make-up, lotions, powders, perfumes/cologne, or deodorant  Do not wear nail polish including gel and S&S, artificial/acrylic nails, or any other type of covering on natural nails including finger and toenails. If you have artificial nails, gel coating, etc. that needs to be removed by a nail salon please have this removed prior to surgery or surgery may need to be canceled/ delayed if the surgeon/ anesthesia feels like they are unable to be safely monitored.   Do not shave  48 hours prior to surgery.    Do not bring valuables to the hospital. [place].   Contacts, glasses, dentures or bridgework may not be worn into surgery.  DO NOT [place]. PHARMACY WILL DISPENSE MEDICATIONS LISTED ON YOUR MEDICATION LIST TO YOU DURING YOUR ADMISSION [place]!    Patients discharged on the day of surgery will not be allowed to drive home.  Someone NEEDS to stay with you for the first 24 hours after anesthesia.              Please read over the following fact sheets you were given: IF [place] [id number] Schneiders    If you received a COVID test during your  pre-op visit  it is requested that you wear a mask when out in public, stay away from anyone that may not be feeling well and notify your surgeon if you develop symptoms. If you test positive for Covid or have been in contact with anyone that has tested positive in the last 10 days please notify you surgeon.    [place] - Preparing for Surgery Before surgery, you can play an important role.  Because skin is not sterile, your skin needs to be as free of germs as possible.  You can reduce the number of germs on your skin by washing with CHG (chlorahexidine gluconate) soap before surgery.  CHG is an antiseptic cleaner which kills germs and bonds with the skin to continue killing germs even after washing. Please DO NOT use if you have an allergy to CHG or antibacterial soaps.  If your skin becomes reddened/irritated stop using the CHG and inform your nurse when you arrive at Short Stay. Do not shave (including legs and underarms) for at least 48 hours prior to the first CHG shower.  You may shave your face/neck.  Please follow these instructions carefully:  1.  Shower with CHG Soap the night before surgery and the  morning of surgery.  2.  If you choose to wash your hair, wash your hair first as usual with your normal  shampoo.  3.  After you shampoo, rinse your hair and body thoroughly to remove the shampoo.  4.  Use CHG as you would any other liquid soap.  You can apply chg directly to the skin and wash.  Gently with a scrungie or clean washcloth.  5.  Apply the CHG Soap to your body ONLY FROM THE NECK DOWN.   Do   not use on face/ open                           Wound or open sores. Avoid contact with eyes, ears mouth and   genitals (private parts).                       Wash face,  Genitals (private parts) with your normal soap.             6.  Wash thoroughly, paying special attention to the area where your    surgery  will be performed.  7.  Thoroughly rinse your body  with warm water from the neck down.  8.  DO NOT shower/wash with your normal soap after using and rinsing off the CHG Soap.                9.  Pat yourself dry with a clean towel.            10.  Wear clean pajamas.            11.  Place clean sheets on your bed the night of your first shower and do not  sleep with pets. Day of Surgery : Do not apply any lotions/deodorants the morning of surgery.  Please wear clean clothes to the hospital/surgery center.  FAILURE TO FOLLOW THESE INSTRUCTIONS MAY RESULT IN THE CANCELLATION OF YOUR SURGERY  PATIENT SIGNATURE_________________________________  NURSE SIGNATURE__________________________________  ________________________________________________________________________  Adam Phenix  An incentive spirometer is a tool that can help keep your lungs clear and active. This tool measures how well you are filling your lungs with each breath. Taking long deep breaths may help reverse or decrease the chance of developing breathing (pulmonary) problems (especially infection) following: A long period of time when you are unable to move or be active. BEFORE THE PROCEDURE  If the spirometer includes an indicator to show your best effort, your nurse or respiratory therapist will set it to a desired goal. If possible, sit up straight or lean slightly forward. Try not to slouch. Hold the incentive spirometer in an upright position. INSTRUCTIONS FOR USE  Sit on the edge of your bed if possible, or sit up as far as you can in bed or on a chair. Hold the incentive spirometer in an upright position. Breathe out normally. Place the mouthpiece in your mouth and seal your lips tightly around it. Breathe in slowly and as deeply as possible, raising the piston or the ball toward the top of the column. Hold your breath for 3-5 seconds or for as long as possible. Allow the piston or ball to fall to the bottom of the column. Remove the mouthpiece from your mouth  and breathe out normally. Rest for a few seconds and repeat Steps 1 through 7 at least 10 times every 1-2 hours when you are awake. Take your time and take a few normal breaths between deep breaths. The spirometer may include an indicator to show your best effort. Use the indicator as a goal to work toward during each repetition. After each set of 10 deep breaths, practice coughing to be sure your  lungs are clear. If you have an incision (the cut made at the time of surgery), support your incision when coughing by placing a pillow or rolled up towels firmly against it. Once you are able to get out of bed, walk around indoors and cough well. You may stop using the incentive spirometer when instructed by your caregiver.  RISKS AND COMPLICATIONS Take your time so you do not get dizzy or light-headed. If you are in pain, you may need to take or ask for pain medication before doing incentive spirometry. It is harder to take a deep breath if you are having pain. AFTER USE Rest and breathe slowly and easily. It can be helpful to keep track of a log of your progress. Your caregiver can provide you with a simple table to help with this. If you are using the spirometer at home, follow these instructions: [place] IF:  You are having difficultly using the spirometer. You have trouble using the spirometer as often as instructed. Your pain medication is not giving enough relief while using the spirometer. You develop fever of 100.5 F (38.1 C) or higher. SEEK IMMEDIATE MEDICAL CARE IF:  You cough up bloody sputum that had not been present before. You develop fever of 102 F (38.9 C) or greater. You develop worsening pain at or near the incision site. MAKE SURE YOU:  Understand these instructions. Will watch your condition. Will get help right away if you are not doing well or get worse. Document Released: 08/29/2006 Document Revised: 07/11/2011 Document Reviewed: 10/30/2006 ExitCare Patient  Information 2014 ExitCare, [place].   ________________________________________________________________________ WHAT IS A BLOOD TRANSFUSION? Blood Transfusion Information  A transfusion is the replacement of blood or some of its parts. Blood is made up of multiple cells which provide different functions. Red blood cells carry oxygen and are used for blood loss replacement. White blood cells fight against infection. Platelets control bleeding. Plasma helps clot blood. Other blood products are available for specialized needs, such as hemophilia or other clotting disorders. BEFORE THE TRANSFUSION  Who gives blood for transfusions?  Healthy volunteers who are fully evaluated to make sure their blood is safe. This is blood bank blood. Transfusion therapy is the safest it has ever been in the practice of medicine. Before blood is taken from a donor, a complete history is taken to make sure that person has no history of diseases nor engages in risky social behavior (examples are intravenous drug use or sexual activity with multiple partners). The donor's travel history is screened to minimize risk of transmitting infections, such as malaria. The donated blood is tested for signs of infectious diseases, such as HIV and hepatitis. The blood is then tested to be sure it is compatible with you in order to minimize the chance of a transfusion reaction. If you or a relative donates blood, this is often done in anticipation of surgery and is not appropriate for emergency situations. It takes many days to process the donated blood. RISKS AND COMPLICATIONS Although transfusion therapy is very safe and saves many lives, the main dangers of transfusion include:  Getting an infectious disease. Developing a transfusion reaction. This is an allergic reaction to something in the blood you were given. Every precaution is taken to prevent this. The decision to have a blood transfusion has been considered carefully by your  caregiver before blood is given. Blood is not given unless the benefits outweigh the risks. AFTER THE TRANSFUSION Right after receiving a blood transfusion, you  will usually feel much better and more energetic. This is especially true if your red blood cells have gotten low (anemic). The transfusion raises the level of the red blood cells which carry oxygen, and this usually causes an energy increase. The nurse administering the transfusion will monitor you carefully for complications. HOME CARE INSTRUCTIONS  No special instructions are needed after a transfusion. You may find your energy is better. Speak with your caregiver about any limitations on activity for underlying diseases you may have. SEEK MEDICAL CARE IF:  Your condition is not improving after your transfusion. You develop redness or irritation at the intravenous (IV) site. SEEK IMMEDIATE MEDICAL CARE IF:  Any of the following symptoms occur over the next 12 hours: Shaking chills. You have a temperature by mouth above 102 F (38.9 C), not controlled by medicine. Chest, back, or muscle pain. People around you feel you are not acting correctly or are confused. Shortness of breath or difficulty breathing. Dizziness and fainting. You get a rash or develop hives. You have a decrease in urine output. Your urine turns a dark color or changes to pink, red, or brown. Any of the following symptoms occur over the next 10 days: You have a temperature by mouth above 102 F (38.9 C), not controlled by medicine. Shortness of breath. Weakness after normal activity. The white part of the eye turns yellow (jaundice). You have a decrease in the amount of urine or are urinating less often. Your urine turns a dark color or changes to pink, red, or brown. Document Released: 04/15/2000 Document Revised: 07/11/2011 Document Reviewed: 12/03/2007 [facility] Patient Information 2014 [place],  [place].  _______________________________________________________________________

## 2022-07-06 ENCOUNTER — Encounter (HOSPITAL_COMMUNITY): Payer: Self-pay | Admitting: Orthopedic Surgery

## 2022-07-06 MED ORDER — TRANEXAMIC ACID 1000 MG/10ML IV SOLN
2000.0000 mg | INTRAVENOUS | Status: AC
  Filled 2022-07-06: qty 20

## 2022-07-06 NOTE — Anesthesia Preprocedure Evaluation (Addendum)
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   Pulmonary exam normal        Cardiovascular negative cardio ROS Normal cardiovascular exam     Neuro/Psych  Headaches  Anxiety        GI/Hepatic Neg liver ROS,,,Hx/o Colon Ca S/P lap sigmoid colectomy   Endo/Other  Hx/o right breast Ca S/P mastectomy and AND  Renal/GU negative Renal ROS  negative genitourinary   Musculoskeletal  (+) Arthritis , Osteoarthritis,  OA right hip Osteopenia   Abdominal   Peds  Hematology  (+) Blood dyscrasia, anemia , HIV  Anesthesia Other Findings   Reproductive/Obstetrics                             Anesthesia Physical Anesthesia Plan  ASA: 3  Anesthesia Plan: Spinal   Post-op Pain Management: Minimal or no pain anticipated and Regional block*   Induction: Intravenous  PONV Risk Score and Plan: 3 and Treatment may vary due to age or medical condition, Propofol infusion and Ondansetron  Airway Management Planned: Natural Airway and Simple Face Mask  Additional Equipment: None  Intra-op Plan:   Post-operative Plan:   Informed Consent: I have reviewed the patients History and Physical, chart, labs and discussed the procedure including the risks, benefits and alternatives for the proposed anesthesia with the patient or authorized representative who has indicated his/her understanding and acceptance.     Dental advisory given  Plan Discussed with: CRNA and Anesthesiologist  Anesthesia Plan Comments: (See PAT note  07/05/2022)       Anesthesia Quick Evaluation

## 2022-07-06 NOTE — Progress Notes (Signed)
Anesthesia Chart Review   Case: [id number] Date/Time: 07/07/22 0716   Procedure: RIGHT TOTAL HIP ARTHROPLASTY ANTERIOR APPROACH (Right: Hip)   Anesthesia type: Spinal   Pre-op diagnosis: RIGHT HIP OSTEOARTHRITIS   Location[facility] ROOM 01 / [facility]   Surgeons: Georgeanna Harrison, MD       DISCUSSION:72 y.o. 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 Per 02/08/22 notes she was to do 3 months of IV antibiotics and then transition to oral with repeat bronch for sputum sample.   Seen by ID after bronch.  Per notes pt doing well, bronch sample negative. She is continued on IV omado, oral clofaz and azithromycin.  She will have need at least a year of negative samples before stopping antibiotics.   Pt seen by PCP 04/28/2022 for preop eval.  Per OV note, "Patient is scheduled for right THA. Discussed with patient that surgical procedure is of intermediate risk (ref intra/postop complications) but apart from the lung disease/infection she is in good health and functional capacity is limited only by her hip pain. My biggest concern relates to type of anesthesia - I think a spinal approach would be fine but I want her to confirm with ortho re: anesthesia and if there is remote chance they would need to do general. In the event of that I would like pulmonologist to clear as well. She agrees and will schedule "  Per pulmonologist Dr. Valeta Harms, pt ok for surgery from a pulmonology standpoint.  Anticipate pt can proceed with planned procedure barring acute status change.   VS: BP (!) 140/83   Pulse 83   Resp 16   Ht '5\' 3"'$  (1.6 m)   Wt 66.7 kg   SpO2 100%   BMI 26.04 kg/m   PROVIDERS: [name], FNP   LABS: Labs reviewed: Acceptable for surgery. (all labs ordered are listed, but only  abnormal results are displayed)  Labs Reviewed  BASIC METABOLIC PANEL - Abnormal; Notable for the following components:      Result Value   Glucose, Bld 119 (*)    Calcium 8.6 (*)    All other components within normal limits  CBC - Abnormal; Notable for the following components:   WBC 3.9 (*)    RBC 3.57 (*)    Hemoglobin 10.8 (*)    HCT 34.1 (*)    All other components within normal limits  SURGICAL PCR SCREEN  TYPE AND SCREEN     IMAGES:   EKG:   CV:  Past Medical History:  Diagnosis Date   Allergies 1995   Per patient report 11/14/18   Anemia    prior to finding colon cancer, no longer any issues with this per patient   Anxiety    Uses for sleep and long car rides   Arthritis    Breast cancer ([place])    Breast cancer, right breast ([place]) 09/2017   S/P mastectomy 12/05/2017   Colon cancer ([place])    COPD (chronic obstructive pulmonary disease) ([place])    pt reports previous MD diagnosed her with this, but takes no medications   Dyspnea    occasionally with exertion   Family history of breast cancer    Headache 1968   per patient report, started as a teenager   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   Mycobacterium infection    lung   Osteopenia 03/2018   Seen on DEXA Scan   PONV (postoperative nausea and vomiting)     Past Surgical History:  Procedure Laterality Date   BACK SURGERY  2003   Patient will occassional back pain since 2003   BIOPSY  09/23/2019   Procedure: BIOPSY;  Surgeon: Otis Brace, MD;  [facility];  Service: Gastroenterology;;  EGD and COLON   BREAST BIOPSY Right 09/2017   BRONCHIAL BIOPSY  08/31/2021   Procedure: BRONCHIAL BIOPSIES;  Surgeon: Garner Nash, DO;  Location: [place];  Service: Pulmonary;;   BRONCHIAL BIOPSY  05/17/2022   Procedure: BRONCHIAL BIOPSIES;  Surgeon: Garner Nash, DO;  Location: [place];  Service: Pulmonary;;   BRONCHIAL BRUSHINGS  08/31/2021   Procedure: BRONCHIAL BRUSHINGS;  Surgeon: Garner Nash, DO;  Location: [place];  Service: Pulmonary;;   BRONCHIAL NEEDLE ASPIRATION BIOPSY  08/31/2021   Procedure: BRONCHIAL NEEDLE ASPIRATION BIOPSIES;  Surgeon: Garner Nash, DO;  Location: [place];  Service: Pulmonary;;   BRONCHIAL WASHINGS  08/31/2021   Procedure: BRONCHIAL WASHINGS;  Surgeon: Garner Nash, DO;  Location: [place];  Service: Pulmonary;;   BRONCHIAL WASHINGS  05/17/2022   Procedure: BRONCHIAL WASHINGS;  Surgeon: Garner Nash, DO;  Location: [place];  Service: Pulmonary;;   CESAREAN SECTION  1985; 1992   COLONOSCOPY WITH PROPOFOL N/A 09/23/2019   Procedure: COLONOSCOPY WITH PROPOFOL and EGD;  Surgeon: Otis Brace, MD;  [facility];  Service: Gastroenterology;  Laterality: N/A;   DILATION AND CURETTAGE OF UTERUS     ESOPHAGOGASTRODUODENOSCOPY N/A 09/23/2019   Procedure: ESOPHAGOGASTRODUODENOSCOPY (EGD);  Surgeon: Otis Brace, MD;  Location: [facility];  Service: Gastroenterology;  Laterality: N/A;   FLEXIBLE SIGMOIDOSCOPY N/A 11/22/2019   Procedure: FLEXIBLE SIGMOIDOSCOPY;  Surgeon: [name], MD;  [facility];  Service: General;  Laterality: N/A;   LUMBAR [place] SURGERY  2003   MASTECTOMY Left 12/05/2017   PROPHYLACTIC MASTECTOMY   MASTECTOMY COMPLETE / SIMPLE W/ SENTINEL NODE BIOPSY Right 12/05/2017   WITH RADIOACTIVE SEED TARGETED RIGHT AXIILARY LYMPH NODE EXCISION AND RIGHT SENTINEL LYMPH NODE BIOPSY,    MASTECTOMY WITH RADIOACTIVE SEED GUIDED EXCISION AND AXILLARY SENTINEL LYMPH NODE BIOPSY Bilateral 12/05/2017   Procedure: RIGHT SIMPLE MASTECTOMY WITH RADIOACTIVE SEED TARGETED RIGHT AXIILARY LYMPH NODE EXCISION AND RIGHT SENTINEL LYMPH NODE BIOPSY, LEFT PROPHYLACTIC MASTECTOMY;  Surgeon: [name], MD;  Location: [place];  Service: General;  Laterality: Bilateral;   SUBMUCOSAL TATTOO  INJECTION  09/23/2019   Procedure: SUBMUCOSAL TATTOO INJECTION;  Surgeon: Otis Brace, MD;  [facility];  Service: Gastroenterology;;   VIDEO BRONCHOSCOPY WITH RADIAL ENDOBRONCHIAL ULTRASOUND  08/31/2021   Procedure: RADIAL ENDOBRONCHIAL ULTRASOUND;  Surgeon: Garner Nash, DO;  [facility];  Service: Pulmonary;;    MEDICATIONS:  albuterol (VENTOLIN HFA) 108 (90 Base) MCG/ACT inhaler   ALPRAZolam (NIRAVAM) 0.5 MG dissolvable tablet   azithromycin (ZITHROMAX) 500 MG tablet   Calcium Carbonate-Vitamin D (CALCIUM 600+D PO)   Cholecalciferol (VITAMIN D) 50 MCG (2000 UT) tablet   CLOFAZIMINE PO   cyanocobalamin (VITAMIN B12) 1000 MCG tablet   exemestane (AROMASIN) 25 MG tablet   gabapentin (NEURONTIN) 300 MG capsule   Omadacycline Tosylate (NUZYRA) 100 MG SOLR   OVER THE COUNTER MEDICATION   OVER THE COUNTER MEDICATION  OVER THE COUNTER MEDICATION   pantoprazole (PROTONIX) 40 MG tablet   Probiotic Product (PROBIOTIC PO)   vitamin E 180 MG (400 UNITS) capsule   No current facility-administered medications for this encounter.    [START ON 07/07/2022] tranexamic acid (CYKLOKAPRON) 2,000 mg in sodium chloride 0.9 % 50 mL Topical Application    [place] Ward, PA-C [facility]Surgical Testing [phone]

## 2022-07-07 ENCOUNTER — Encounter (HOSPITAL_COMMUNITY): Payer: Self-pay | Admitting: Orthopedic Surgery

## 2022-07-07 LAB — TYPE AND SCREEN
ABO/RH(D): A POS
Antibody Screen: NEGATIVE

## 2022-07-07 NOTE — Progress Notes (Signed)
Patient was called for a surgery time change on 3/6 yesterday evening.  Patient told short stay staff that she was waiting on prior authorization from her insurance company.     Morning SS staff was notified morning 3/7 of the need for prior authorization.  Patient was called at 0800 am for update on insurance prior authorization, at that time no update was given to the patient.  Patient stated that she was supposed to hear from [place] at 0900.  Patient was instructed by Short Stay staff to head to the hospital.  [facility] SS Coordinator called Cassie Freer at [phone] to speak with Surgery Verdene Lennert,  She stated it would be 15-30 more minutes.  I requested she contact SS when she has an update.    At [place] returned call to state that the prior authorization would not be ready until later this afternoon.    Patient arrive to [facility] hospital around 0915.  SS Coordinator went out to speak with patient and family that the prior authorization was not ready at that time and that it would be later this afternoon before the answer was ready.  SS Coordinator gave the patient the option to stay at hospital to wait for answer or she could go home and stay until the answer came.    Patient stated she wanted to go home and wait.    At 1002 SS staff were notified that the patients surgery was cancelled and moved to 3/12 Patient called and made aware by [facility] staff, patient verbalized understanding

## 2022-07-11 ENCOUNTER — Encounter (HOSPITAL_COMMUNITY): Payer: Self-pay | Admitting: Orthopedic Surgery

## 2022-07-11 NOTE — Progress Notes (Signed)
During preop call, (approx 1445) Pt requested I call Dr. Dierdre Highman office to see if her insurance had cleared for surgery tomorrow since this caused her to be cancelled last week. Pt stats she has called multiple times and is unable to get anyone to call her back. This nurse called Dr. Dierdre Highman office and requested a callback from his scheduler. Scheduler, Wells Guiles returned call at this time stating that they did have insurance clearance for pt's surgery tomorrow and their should not be anymore delays. Pt notified at this time.

## 2022-07-11 NOTE — Pre-Procedure Instructions (Signed)
PCP -  Cardiologist - n/a  EKG - DOS order (last 06/29/22) Chest x-ray - DOS ECHO - n/a Cardiac Cath - n/ CPAP - n/a  Blood Thinner Instructions: n/a Aspirin Instructions: n/a  ERAS Protcol - Yes 0715 COVID TEST- n/a  Anesthesia review: no Recent PAT- surgery from [facility] cancelled last week -------------  SDW INSTRUCTIONS:  Your procedure is scheduled on 07/12/22. Please report to [facility] Main Entrance "A" at [place].M., and check in at the Admitting office. Call this number if you have problems the morning of surgery: [phone]   Remember: Do not eat  after midnight the night before your surgery  You may drink clear liquids until 0715 the morning of your surgery.   Clear liquids allowed are: Water, Non-Citrus Juices (without pulp), Carbonated Beverages, Clear Tea, Black Coffee Only, and Gatorade   Medications to take morning of surgery with a sip of water include: azithromycin, clofazimine, exemestane, gabapentin, protonix   As of today, STOP taking any Aspirin (unless otherwise instructed by your surgeon), Aleve, Naproxen, Ibuprofen, Motrin, Advil, Goody's, BC's, all herbal medications, fish oil, and all vitamins.    The Morning of Surgery Do not wear jewelry, make-up or nail polish. Do not wear lotions, powders, or perfumes/colognes, or deodorant Do not bring valuables to the hospital. [facility] is not responsible for any belongings or valuables.  If you are a smoker, DO NOT Smoke 24 hours prior to surgery  If you wear a CPAP at night please bring your mask the morning of surgery   Remember that you must have someone to transport you home after your surgery, and remain with you for 24 hours if you are discharged the same day.  Please bring cases for contacts, glasses, hearing aids, dentures or bridgework because it cannot be worn into surgery.   Patients discharged the day of surgery will not be allowed to drive home.   Please shower the NIGHT BEFORE/MORNING OF  SURGERY (use antibacterial soap like DIAL soap if possible). Wear comfortable clothes the morning of surgery. Oral Hygiene is also important to reduce your risk of infection.  Remember - BRUSH YOUR TEETH THE MORNING OF SURGERY WITH YOUR REGULAR TOOTHPASTE  Patient denies shortness of breath, fever, cough and chest pain.

## 2022-07-12 ENCOUNTER — Observation Stay (HOSPITAL_COMMUNITY): Payer: Commercial Managed Care - PPO

## 2022-07-12 ENCOUNTER — Ambulatory Visit (HOSPITAL_COMMUNITY): Payer: Commercial Managed Care - PPO

## 2022-07-12 ENCOUNTER — Ambulatory Visit (HOSPITAL_COMMUNITY): Payer: Commercial Managed Care - PPO | Admitting: Physician Assistant

## 2022-07-12 ENCOUNTER — Encounter (HOSPITAL_COMMUNITY): Payer: Self-pay | Admitting: Orthopedic Surgery

## 2022-07-12 ENCOUNTER — Other Ambulatory Visit: Payer: Self-pay

## 2022-07-12 ENCOUNTER — Inpatient Hospital Stay (HOSPITAL_COMMUNITY)
Admission: AD | Admit: 2022-07-12 | Discharge: 2022-07-15 | DRG: 470 | Disposition: A | Discharge status: Home Health | Payer: Commercial Managed Care - PPO | Attending: Orthopedic Surgery | Admitting: Orthopedic Surgery

## 2022-07-12 ENCOUNTER — Other Ambulatory Visit (HOSPITAL_COMMUNITY): Payer: Self-pay

## 2022-07-12 ENCOUNTER — Encounter (HOSPITAL_COMMUNITY)
Admission: AD | Disposition: A | Discharge status: Home Health | Payer: Self-pay | Source: Home / Self Care | Attending: Orthopedic Surgery

## 2022-07-12 ENCOUNTER — Ambulatory Visit (HOSPITAL_COMMUNITY): Payer: Commercial Managed Care - PPO | Admitting: Anesthesiology

## 2022-07-12 DIAGNOSIS — Z79811 Long term (current) use of aromatase inhibitors: Secondary | ICD-10-CM

## 2022-07-12 DIAGNOSIS — J449 Chronic obstructive pulmonary disease, unspecified: Secondary | ICD-10-CM | POA: Diagnosis not present

## 2022-07-12 DIAGNOSIS — Z9013 Acquired absence of bilateral breasts and nipples: Secondary | ICD-10-CM

## 2022-07-12 DIAGNOSIS — M1611 Unilateral primary osteoarthritis, right hip: Principal | ICD-10-CM | POA: Diagnosis present

## 2022-07-12 DIAGNOSIS — M810 Age-related osteoporosis without current pathological fracture: Secondary | ICD-10-CM | POA: Diagnosis not present

## 2022-07-12 DIAGNOSIS — F419 Anxiety disorder, unspecified: Secondary | ICD-10-CM | POA: Diagnosis not present

## 2022-07-12 DIAGNOSIS — Z8619 Personal history of other infectious and parasitic diseases: Secondary | ICD-10-CM

## 2022-07-12 DIAGNOSIS — I951 Orthostatic hypotension: Secondary | ICD-10-CM | POA: Diagnosis not present

## 2022-07-12 DIAGNOSIS — Z825 Family history of asthma and other chronic lower respiratory diseases: Secondary | ICD-10-CM

## 2022-07-12 DIAGNOSIS — Z85038 Personal history of other malignant neoplasm of large intestine: Secondary | ICD-10-CM

## 2022-07-12 DIAGNOSIS — Z96649 Presence of unspecified artificial hip joint: Principal | ICD-10-CM

## 2022-07-12 DIAGNOSIS — Z803 Family history of malignant neoplasm of breast: Secondary | ICD-10-CM

## 2022-07-12 DIAGNOSIS — M21751 Unequal limb length (acquired), right femur: Secondary | ICD-10-CM | POA: Diagnosis present

## 2022-07-12 DIAGNOSIS — D62 Acute posthemorrhagic anemia: Secondary | ICD-10-CM | POA: Diagnosis not present

## 2022-07-12 DIAGNOSIS — Z9012 Acquired absence of left breast and nipple: Secondary | ICD-10-CM

## 2022-07-12 DIAGNOSIS — Z853 Personal history of malignant neoplasm of breast: Secondary | ICD-10-CM

## 2022-07-12 DIAGNOSIS — Z79899 Other long term (current) drug therapy: Secondary | ICD-10-CM

## 2022-07-12 HISTORY — PX: TOTAL HIP ARTHROPLASTY: SHX124

## 2022-07-12 SURGERY — ARTHROPLASTY, HIP, TOTAL, ANTERIOR APPROACH
Anesthesia: General | Site: Hip | Laterality: Right

## 2022-07-12 MED ORDER — ACETAMINOPHEN 500 MG PO TABS
1000.0000 mg | ORAL_TABLET | Freq: Four times a day (QID) | ORAL | Status: AC
  Administered 2022-07-12 – 2022-07-13 (×4): 1000 mg via ORAL
  Filled 2022-07-12 (×4): qty 2

## 2022-07-12 MED ORDER — SODIUM CHLORIDE (PF) 0.9 % IJ SOLN
INTRAMUSCULAR | Status: DC | PRN
  Administered 2022-07-12: 40 mL via INTRAMUSCULAR

## 2022-07-12 MED ORDER — ONDANSETRON HCL 4 MG/2ML IJ SOLN
INTRAMUSCULAR | Status: DC | PRN
  Administered 2022-07-12: 4 mg via INTRAVENOUS

## 2022-07-12 MED ORDER — HYDROMORPHONE HCL 1 MG/ML IJ SOLN: 0.5000 mg | INTRAMUSCULAR | Status: DC | PRN

## 2022-07-12 MED ORDER — SENNOSIDES-DOCUSATE SODIUM 8.6-50 MG PO TABS: 1.0000 | ORAL_TABLET | Freq: Every evening | ORAL | Status: DC | PRN

## 2022-07-12 MED ORDER — PRONTOSAN WOUND IRRIGATION OPTIME
TOPICAL | Status: DC | PRN
  Administered 2022-07-12: 2 via TOPICAL

## 2022-07-12 MED ORDER — KETAMINE HCL 50 MG/5ML IJ SOSY
PREFILLED_SYRINGE | INTRAMUSCULAR | Status: AC
  Filled 2022-07-12: qty 5

## 2022-07-12 MED ORDER — ORAL CARE MOUTH RINSE: 15.0000 mL | Freq: Once | OROMUCOSAL | Status: AC

## 2022-07-12 MED ORDER — VANCOMYCIN HCL 1000 MG IV SOLR
INTRAVENOUS | Status: AC
  Filled 2022-07-12: qty 20

## 2022-07-12 MED ORDER — EPHEDRINE 5 MG/ML INJ
INTRAVENOUS | Status: AC
  Filled 2022-07-12: qty 5

## 2022-07-12 MED ORDER — PHENYLEPHRINE 80 MCG/ML (10ML) SYRINGE FOR IV PUSH (FOR BLOOD PRESSURE SUPPORT)
PREFILLED_SYRINGE | INTRAVENOUS | Status: AC
  Filled 2022-07-12: qty 10

## 2022-07-12 MED ORDER — CHLORHEXIDINE GLUCONATE 0.12 % MT SOLN
15.0000 mL | Freq: Once | OROMUCOSAL | Status: AC
  Administered 2022-07-12: 15 mL via OROMUCOSAL
  Filled 2022-07-12: qty 15

## 2022-07-12 MED ORDER — MIDAZOLAM HCL 2 MG/2ML IJ SOLN
INTRAMUSCULAR | Status: DC | PRN
  Administered 2022-07-12: 2 mg via INTRAVENOUS

## 2022-07-12 MED ORDER — ASPIRIN 81 MG PO CHEW
81.0000 mg | CHEWABLE_TABLET | Freq: Two times a day (BID) | ORAL | Status: DC
  Administered 2022-07-12 – 2022-07-15 (×6): 81 mg via ORAL
  Filled 2022-07-12 (×7): qty 1

## 2022-07-12 MED ORDER — LACTATED RINGERS IV SOLN: INTRAVENOUS | Status: DC

## 2022-07-12 MED ORDER — POVIDONE-IODINE 10 % EX SWAB
2.0000 | Freq: Once | CUTANEOUS | Status: AC
  Administered 2022-07-12: 2 via TOPICAL

## 2022-07-12 MED ORDER — 0.9 % SODIUM CHLORIDE (POUR BTL) OPTIME
TOPICAL | Status: DC | PRN
  Administered 2022-07-12: 1000 mL

## 2022-07-12 MED ORDER — CEFAZOLIN SODIUM-DEXTROSE 1-4 GM/50ML-% IV SOLN
1.0000 g | Freq: Four times a day (QID) | INTRAVENOUS | Status: AC
  Administered 2022-07-12 – 2022-07-13 (×2): 1 g via INTRAVENOUS
  Filled 2022-07-12 (×2): qty 50

## 2022-07-12 MED ORDER — CELECOXIB 100 MG PO CAPS
100.0000 mg | ORAL_CAPSULE | Freq: Two times a day (BID) | ORAL | Status: DC
  Administered 2022-07-12 – 2022-07-15 (×6): 100 mg via ORAL
  Filled 2022-07-12 (×8): qty 1

## 2022-07-12 MED ORDER — BUPIVACAINE LIPOSOME 1.3 % IJ SUSP
INTRAMUSCULAR | Status: AC
  Filled 2022-07-12: qty 20

## 2022-07-12 MED ORDER — DOCUSATE SODIUM 100 MG PO CAPS
100.0000 mg | ORAL_CAPSULE | Freq: Two times a day (BID) | ORAL | Status: DC
  Administered 2022-07-12 – 2022-07-15 (×5): 100 mg via ORAL
  Filled 2022-07-12 (×6): qty 1

## 2022-07-12 MED ORDER — STERILE WATER FOR IRRIGATION IR SOLN
Status: DC | PRN
  Administered 2022-07-12: 1000 mL

## 2022-07-12 MED ORDER — FENTANYL CITRATE (PF) 250 MCG/5ML IJ SOLN
INTRAMUSCULAR | Status: DC | PRN
  Administered 2022-07-12 (×5): 50 ug via INTRAVENOUS

## 2022-07-12 MED ORDER — MENTHOL 3 MG MT LOZG: 1.0000 | LOZENGE | OROMUCOSAL | Status: DC | PRN

## 2022-07-12 MED ORDER — BUPIVACAINE LIPOSOME 1.3 % IJ SUSP: 20.0000 mL | Freq: Once | INTRAMUSCULAR | Status: DC

## 2022-07-12 MED ORDER — OXYCODONE HCL 5 MG PO TABS
5.0000 mg | ORAL_TABLET | Freq: Four times a day (QID) | ORAL | 0 refills | Status: DC | PRN
  Filled 2022-07-12: qty 40, 10d supply, fill #0

## 2022-07-12 MED ORDER — FENTANYL CITRATE (PF) 250 MCG/5ML IJ SOLN
INTRAMUSCULAR | Status: AC
  Filled 2022-07-12: qty 5

## 2022-07-12 MED ORDER — ACETAMINOPHEN 325 MG PO TABS: 325.0000 mg | ORAL_TABLET | Freq: Four times a day (QID) | ORAL | Status: DC | PRN

## 2022-07-12 MED ORDER — PROPOFOL 500 MG/50ML IV EMUL
INTRAVENOUS | Status: DC | PRN
  Administered 2022-07-12: 50 ug/kg/min via INTRAVENOUS

## 2022-07-12 MED ORDER — PHENYLEPHRINE HCL-NACL 20-0.9 MG/250ML-% IV SOLN
INTRAVENOUS | Status: DC | PRN
  Administered 2022-07-12: 25 ug/min via INTRAVENOUS

## 2022-07-12 MED ORDER — PROMETHAZINE HCL 12.5 MG PO TABS
12.5000 mg | ORAL_TABLET | Freq: Four times a day (QID) | ORAL | 0 refills | Status: DC | PRN
  Filled 2022-07-12: qty 30, 8d supply, fill #0

## 2022-07-12 MED ORDER — ONDANSETRON HCL 4 MG/2ML IJ SOLN
INTRAMUSCULAR | Status: AC
  Filled 2022-07-12: qty 2

## 2022-07-12 MED ORDER — PHENYLEPHRINE 80 MCG/ML (10ML) SYRINGE FOR IV PUSH (FOR BLOOD PRESSURE SUPPORT)
PREFILLED_SYRINGE | INTRAVENOUS | Status: DC | PRN
  Administered 2022-07-12: 160 ug via INTRAVENOUS
  Administered 2022-07-12: 80 ug via INTRAVENOUS
  Administered 2022-07-12: 40 ug via INTRAVENOUS
  Administered 2022-07-12 (×3): 80 ug via INTRAVENOUS

## 2022-07-12 MED ORDER — BUPIVACAINE-MELOXICAM ER 400-12 MG/14ML IJ SOLN
INTRAMUSCULAR | Status: AC
  Filled 2022-07-12: qty 1

## 2022-07-12 MED ORDER — OXYCODONE HCL 5 MG PO TABS
5.0000 mg | ORAL_TABLET | ORAL | Status: DC | PRN
  Administered 2022-07-12: 10 mg via ORAL
  Filled 2022-07-12: qty 2

## 2022-07-12 MED ORDER — PROPOFOL 1000 MG/100ML IV EMUL
INTRAVENOUS | Status: AC
  Filled 2022-07-12: qty 100

## 2022-07-12 MED ORDER — MIDAZOLAM HCL 2 MG/2ML IJ SOLN
INTRAMUSCULAR | Status: AC
  Filled 2022-07-12: qty 2

## 2022-07-12 MED ORDER — PHENOL 1.4 % MT LIQD: 1.0000 | OROMUCOSAL | Status: DC | PRN

## 2022-07-12 MED ORDER — ONDANSETRON HCL 4 MG/2ML IJ SOLN
4.0000 mg | Freq: Four times a day (QID) | INTRAMUSCULAR | Status: DC | PRN
  Administered 2022-07-12 – 2022-07-13 (×2): 4 mg via INTRAVENOUS
  Filled 2022-07-12 (×2): qty 2

## 2022-07-12 MED ORDER — SODIUM CHLORIDE 0.9 % IR SOLN
Status: DC | PRN
  Administered 2022-07-12: 1000 mL

## 2022-07-12 MED ORDER — EPHEDRINE SULFATE-NACL 50-0.9 MG/10ML-% IV SOSY
PREFILLED_SYRINGE | INTRAVENOUS | Status: DC | PRN
  Administered 2022-07-12: 5 mg via INTRAVENOUS

## 2022-07-12 MED ORDER — ALBUMIN HUMAN 5 % IV SOLN: INTRAVENOUS | Status: DC | PRN

## 2022-07-12 MED ORDER — KETAMINE HCL 50 MG/ML IJ SOLN
INTRAMUSCULAR | Status: DC | PRN
  Administered 2022-07-12: 20 mg via INTRAMUSCULAR
  Administered 2022-07-12: 10 mg via INTRAMUSCULAR
  Administered 2022-07-12: 20 mg via INTRAMUSCULAR

## 2022-07-12 MED ORDER — TRANEXAMIC ACID-NACL 1000-0.7 MG/100ML-% IV SOLN
1000.0000 mg | INTRAVENOUS | Status: AC
  Administered 2022-07-12 (×2): 1000 mg via INTRAVENOUS
  Filled 2022-07-12: qty 100

## 2022-07-12 MED ORDER — BUPIVACAINE IN DEXTROSE 0.75-8.25 % IT SOLN
INTRATHECAL | Status: DC | PRN
  Administered 2022-07-12: 1.8 mL via INTRATHECAL

## 2022-07-12 MED ORDER — CEFAZOLIN SODIUM-DEXTROSE 2-4 GM/100ML-% IV SOLN
2.0000 g | INTRAVENOUS | Status: AC
  Administered 2022-07-12 (×2): 2 g via INTRAVENOUS
  Filled 2022-07-12: qty 100

## 2022-07-12 MED ORDER — TRANEXAMIC ACID-NACL 1000-0.7 MG/100ML-% IV SOLN
INTRAVENOUS | Status: AC
  Filled 2022-07-12: qty 100

## 2022-07-12 MED ORDER — DIPHENHYDRAMINE HCL 12.5 MG/5ML PO ELIX: 12.5000 mg | ORAL_SOLUTION | ORAL | Status: DC | PRN

## 2022-07-12 MED ORDER — VANCOMYCIN HCL 1 G IV SOLR
INTRAVENOUS | Status: DC | PRN
  Administered 2022-07-12: 1000 mg via TOPICAL

## 2022-07-12 MED ORDER — ONDANSETRON HCL 4 MG PO TABS: 4.0000 mg | ORAL_TABLET | Freq: Four times a day (QID) | ORAL | Status: DC | PRN

## 2022-07-12 MED ORDER — OXYCODONE HCL 5 MG PO TABS
10.0000 mg | ORAL_TABLET | ORAL | Status: DC | PRN
  Administered 2022-07-12: 15 mg via ORAL
  Filled 2022-07-12: qty 3

## 2022-07-12 MED ORDER — CELECOXIB 100 MG PO CAPS
100.0000 mg | ORAL_CAPSULE | Freq: Two times a day (BID) | ORAL | 2 refills | Status: DC
  Filled 2022-07-12: qty 60, 30d supply, fill #0
  Filled 2022-08-05: qty 60, 30d supply, fill #1
  Filled 2022-09-06: qty 60, 30d supply, fill #2

## 2022-07-12 SURGICAL SUPPLY — 74 items
ADH SKN CLS APL DERMABOND .7 (GAUZE/BANDAGES/DRESSINGS) ×1
BAG COUNTER SPONGE SURGICOUNT (BAG) ×2 IMPLANT
BAG DECANTER FOR FLEXI CONT (MISCELLANEOUS) ×2 IMPLANT
BAG SPNG CNTER NS LX DISP (BAG) ×1
BLADE SAG 18X100X1.27 (BLADE) ×2 IMPLANT
CLSR STERI-STRIP ANTIMIC 1/2X4 (GAUZE/BANDAGES/DRESSINGS) IMPLANT
COVER PERINEAL POST (MISCELLANEOUS) ×2 IMPLANT
COVER SURGICAL LIGHT HANDLE (MISCELLANEOUS) ×2 IMPLANT
CUP ACETAB PIN MULTI 54MM (Orthopedic Implant) IMPLANT
DERMABOND ADVANCED .7 DNX12 (GAUZE/BANDAGES/DRESSINGS) IMPLANT
DRAPE C-ARM 42X72 X-RAY (DRAPES) ×2 IMPLANT
DRAPE POUCH INSTRU U-SHP 10X18 (DRAPES) ×2 IMPLANT
DRAPE STERI IOBAN 125X83 (DRAPES) ×2 IMPLANT
DRAPE U-SHAPE 47X51 STRL (DRAPES) ×4 IMPLANT
DRSG AQUACEL AG ADV 3.5X10 (GAUZE/BANDAGES/DRESSINGS) ×2 IMPLANT
DURAPREP 26ML APPLICATOR (WOUND CARE) ×4 IMPLANT
ELECT BLADE 4.0 EZ CLEAN MEGAD (MISCELLANEOUS) ×1
ELECT REM PT RETURN 9FT ADLT (ELECTROSURGICAL) ×1
ELECTRODE BLDE 4.0 EZ CLN MEGD (MISCELLANEOUS) ×2 IMPLANT
ELECTRODE REM PT RTRN 9FT ADLT (ELECTROSURGICAL) ×2 IMPLANT
ELIMINATOR HOLE APEX DEPUY (Hips) IMPLANT
GLOVE BIOGEL PI IND STRL 7.0 (GLOVE) ×4 IMPLANT
GLOVE BIOGEL PI IND STRL 7.5 (GLOVE) ×10 IMPLANT
GLOVE ECLIPSE 7.0 STRL STRAW (GLOVE) ×4 IMPLANT
GLOVE SKINSENSE STRL SZ7.5 (GLOVE) ×2 IMPLANT
GLOVE SURG SYN 7.5  E (GLOVE) ×2
GLOVE SURG SYN 7.5 E (GLOVE) ×2 IMPLANT
GLOVE SURG SYN 7.5 PF PI (GLOVE) ×4 IMPLANT
GLOVE SURG UNDER POLY LF SZ7 (GLOVE) ×6 IMPLANT
GLOVE SURG UNDER POLY LF SZ7.5 (GLOVE) ×4 IMPLANT
GOWN STRL REUS W/ TWL LRG LVL3 (GOWN DISPOSABLE) IMPLANT
GOWN STRL REUS W/ TWL XL LVL3 (GOWN DISPOSABLE) ×2 IMPLANT
GOWN STRL REUS W/TWL LRG LVL3 (GOWN DISPOSABLE)
GOWN STRL REUS W/TWL XL LVL3 (GOWN DISPOSABLE) ×1
GOWN STRL SURGICAL XL XLNG (GOWN DISPOSABLE) ×2 IMPLANT
GOWN TOGA ZIPPER T7+ PEEL AWAY (MISCELLANEOUS) ×4 IMPLANT
HANDPIECE INTERPULSE COAX TIP (DISPOSABLE) ×1
HEAD CERAMIC BIO DELTA 36 (Hips) IMPLANT
HOOD PEEL AWAY T7 (MISCELLANEOUS) ×2 IMPLANT
IV NS IRRIG 3000ML ARTHROMATIC (IV SOLUTION) ×2 IMPLANT
KIT BASIN OR (CUSTOM PROCEDURE TRAY) ×2 IMPLANT
LINER NEUTRAL 52X36MM PLUS 4 (Liner) IMPLANT
LINER NEUTRAL 54X36MM PLUS 4 (Hips) IMPLANT
MARKER SKIN DUAL TIP RULER LAB (MISCELLANEOUS) ×2 IMPLANT
NDL SPNL 18GX3.5 QUINCKE PK (NEEDLE) ×2 IMPLANT
NEEDLE SPNL 18GX3.5 QUINCKE PK (NEEDLE) ×1 IMPLANT
PACK TOTAL JOINT (CUSTOM PROCEDURE TRAY) ×2 IMPLANT
PACK UNIVERSAL I (CUSTOM PROCEDURE TRAY) ×2 IMPLANT
PIN SECTOR W/GRIP ACE CUP 52MM (Hips) IMPLANT
SCREW 6.5MMX25MM (Screw) IMPLANT
SCREW 6.5MMX30MM (Screw) IMPLANT
SCREW 6.5MMX35MM (Screw) IMPLANT
SCREW PINN CAN BONE 6.5MMX15MM (Screw) IMPLANT
SET HNDPC FAN SPRY TIP SCT (DISPOSABLE) ×2 IMPLANT
SOLUTION PRONTOSAN WOUND 350ML (IRRIGATION / IRRIGATOR) ×2 IMPLANT
STAPLER VISISTAT 35W (STAPLE) IMPLANT
STEM FEMORAL SZ 6MM STD ACTIS (Stem) IMPLANT
SUT ETHIBOND 2 V 37 (SUTURE) ×2 IMPLANT
SUT MNCRL AB 3-0 PS2 18 (SUTURE) IMPLANT
SUT MON AB 2-0 CT1 36 (SUTURE) IMPLANT
SUT PDS AB 1 CTX 36 (SUTURE) IMPLANT
SUT VIC AB 0 CT1 27 (SUTURE) ×1
SUT VIC AB 0 CT1 27XBRD ANBCTR (SUTURE) ×2 IMPLANT
SUT VIC AB 1 CTX 36 (SUTURE) ×1
SUT VIC AB 1 CTX36XBRD ANBCTR (SUTURE) ×2 IMPLANT
SUT VIC AB 2-0 CT1 27 (SUTURE) ×2
SUT VIC AB 2-0 CT1 TAPERPNT 27 (SUTURE) ×4 IMPLANT
SUT VICRYL 0 UR6 27IN ABS (SUTURE) IMPLANT
SYR 50ML LL SCALE MARK (SYRINGE) ×2 IMPLANT
TOWEL GREEN STERILE (TOWEL DISPOSABLE) ×2 IMPLANT
TRAY FOLEY W/BAG SLVR 16FR (SET/KITS/TRAYS/PACK) ×1
TRAY FOLEY W/BAG SLVR 16FR ST (SET/KITS/TRAYS/PACK) IMPLANT
TUBE SUCT ARGYLE STRL (TUBING) ×2 IMPLANT
YANKAUER SUCT BULB TIP NO VENT (SUCTIONS) ×2 IMPLANT

## 2022-07-12 NOTE — Discharge Instructions (Addendum)
Discharge instructions for Dr. Georgeanna Harrison, M.D.: Please refer to the two-sided discharge instructions paper that Dr. Mable Fill placed in the patient's paper chart. Please give this to the patient to take home after reviewing with the patient!!   General discharge instructions:  PLEASE REFER TO TWO-SIDED PAPER INSTRUCTIONS IN [place]!!!  Diet: As you were doing prior to hospitalization. Shower:  Unless otherwise specified (i.e. on two-sided paper instructions with paper chart) may shower but keep the wounds dry, use an occlusive plastic wrap, NO SOAKING IN TUB.  If the bandage gets wet, change with a clean dry gauze. Dressing:  Unless otherwise specified (i.e. on two-sided paper instructions with paper chart), may change your dressing 3-5 days after surgery.  Then change the dressing daily with sterile gauze dressing.  If there are sticky tapes (steri-strips) on your wounds and all the stitches are absorbable.  Leave the steri-strips in place when changing your dressings, they will peel off with time, usually 2-3 weeks. Activity:  Increase activity slowly as tolerated, but follow the restrictions on the two-sided paper discharge instructions sheet that Dr. Mable Fill placed in the paper chart.  No lifting or driving for 6 weeks. Weight Bearing: TOE-TOUCH WEIGHTBEARING (TTWB) on the RIGHT LOWER EXTREMITY. To prevent constipation: You may use over-the-counter stool softener(s) such as Colace (over the counter) 100 mg by mouth twice a day and/or Miralax (over the counter) for constipation as needed.  Drink plenty of fluids (prune juice may be helpful) and high fiber foods.  Itching:  If you experience itching with your medications, try taking only a single pain pill, or even half a pain pill at a time.  You can also use benadryl over the counter for itching or also to help with sleep.  Precautions:  If you experience chest pain or shortness of breath - call 911 immediately for  transfer to the hospital emergency department!!  PLEASE REFER TO TWO-SIDED PAPER INSTRUCTIONS IN PAPER CHART FOR SPECIFIC INSTRUCTIONS!!!  If you develop a fever greater that 101.1 deg F, purulent drainage from wound, increased redness or drainage from wound, or calf pain -- Call the office at [phone].

## 2022-07-12 NOTE — Plan of Care (Signed)

## 2022-07-12 NOTE — Anesthesia Postprocedure Evaluation (Signed)
Anesthesia Post Note  Patient: Sonia Sandoval  Procedure(s) Performed: RIGHT TOTAL HIP ARTHROPLASTY ANTERIOR APPROACH (Right: Hip)     Patient location during evaluation: PACU Anesthesia Type: Spinal Level of consciousness: awake and alert Pain management: pain level controlled Vital Signs Assessment: post-procedure vital signs reviewed and stable Respiratory status: spontaneous breathing, nonlabored ventilation, respiratory function stable and patient connected to nasal cannula oxygen Cardiovascular status: blood pressure returned to baseline and stable Postop Assessment: no apparent nausea or vomiting Anesthetic complications: no   No notable events documented.  Last Vitals:  Vitals:   07/12/22 0811 07/12/22 1600  BP: (!) 157/94 (!) 127/55  Pulse: 81 75  Resp: 18 14  Temp: 36.6 C 36.7 C  SpO2: 99% 100%    Last Pain:  Vitals:   07/12/22 1600  TempSrc:   PainSc: 0-No pain                 Katerine Morua

## 2022-07-12 NOTE — H&P (Signed)
PREOPERATIVE H&P  HPI: Sonia Sandoval is a 72 y.o. female who has presented today for surgery, with the diagnosis of right hip osteoarthritis.  The various methods of treatment have been discussed with the patient and family.  After consideration of risks, benefits, and other options for treatment, the patient has consented to [place] as a surgical intervention.  The patient's history has been reviewed, patient examined, no change in status, stable for surgery.  I have reviewed the patient's chart and labs.  Questions were answered to the patient's satisfaction.    PMH: Past Medical History:  Diagnosis Date   Allergies 1995   Per patient report 11/14/18   Anemia    prior to finding colon cancer, no longer any issues with this per patient   Anxiety    Uses for sleep and long car rides   Arthritis    Breast cancer ([place])    Breast cancer, right breast ([place]) 09/2017   S/P mastectomy 12/05/2017   Colon cancer ([place])    COPD (chronic obstructive pulmonary disease) ([place])    pt reports previous MD diagnosed her with this, but takes no medications   Dyspnea    occasionally with exertion   Family history of breast cancer    Headache 1968   per patient report, started as a teenager   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   Mycobacterium infection    lung   Osteopenia 03/2018   Seen on DEXA Scan   PONV (postoperative nausea and vomiting)     Home Medications Allergies  No current facility-administered medications on file prior to encounter.   Current Outpatient Medications on File Prior to Encounter  Medication Sig Dispense Refill   albuterol (VENTOLIN HFA) 108 (90 Base) MCG/ACT inhaler TAKE 2 PUFFS BY MOUTH EVERY 6 HOURS AS NEEDED FOR WHEEZE OR SHORTNESS OF BREATH 18 each 2   ALPRAZolam (NIRAVAM) 0.5 MG dissolvable tablet Take 0.5 mg by mouth  daily as needed (Sleep/Travel).     azithromycin (ZITHROMAX) 500 MG tablet Take 500 mg by mouth daily.     Calcium Carbonate-Vitamin D (CALCIUM 600+D PO) Take 600 mg by mouth daily.     Cholecalciferol (VITAMIN D) 50 MCG (2000 UT) tablet Take 2,000 Units by mouth daily.     CLOFAZIMINE PO Take 100 mg by mouth daily.     cyanocobalamin (VITAMIN B12) 1000 MCG tablet Take 1,000 mcg by mouth daily. Chew     exemestane (AROMASIN) 25 MG tablet Take 1 tablet (25 mg total) by mouth daily after breakfast. 90 tablet 1   gabapentin (NEURONTIN) 300 MG capsule Take 300 mg by mouth 2 (two) times daily.     Omadacycline Tosylate (NUZYRA) 100 MG SOLR Inject 100 mg into the vein daily. Picc line     OVER THE COUNTER MEDICATION Take 1 tablet by mouth daily. Juice plus fruit blend     OVER THE COUNTER MEDICATION Take 1 capsule by mouth daily. Juice plus berry blend     OVER THE COUNTER MEDICATION Take 1 capsule by mouth daily. Juice plus veggie blend     pantoprazole (PROTONIX) 40 MG tablet Take 40 mg by mouth every morning.     Probiotic Product (PROBIOTIC PO) Take 2 capsules by mouth daily. 800 Million CFU     vitamin E 180 MG (400 UNITS) capsule Take 400  Units by mouth daily.     Allergies  Allergen Reactions   Tigecycline Diarrhea and Nausea And Vomiting   Codeine Nausea And Vomiting and Other (See Comments)    headache   Heparin Rash    Pt states she tolerates heparin flush for PICC but gets a full body rash with drip     PSH: Past Surgical History:  Procedure Laterality Date   BACK SURGERY  2003   Patient will occassional back pain since 2003   BIOPSY  09/23/2019   Procedure: BIOPSY;  Surgeon: Otis Brace, MD;  Location: [facility];  Service: Gastroenterology;;  EGD and COLON   BREAST BIOPSY Right 09/2017   BRONCHIAL BIOPSY  08/31/2021   Procedure: BRONCHIAL BIOPSIES;  Surgeon: Garner Nash, DO;  Location: [place] ENDOSCOPY;  Service: Pulmonary;;   BRONCHIAL BIOPSY  05/17/2022   Procedure:  BRONCHIAL BIOPSIES;  Surgeon: Garner Nash, DO;  Location: [place] ENDOSCOPY;  Service: Pulmonary;;   BRONCHIAL BRUSHINGS  08/31/2021   Procedure: BRONCHIAL BRUSHINGS;  Surgeon: Garner Nash, DO;  Location: [place];  Service: Pulmonary;;   BRONCHIAL NEEDLE ASPIRATION BIOPSY  08/31/2021   Procedure: BRONCHIAL NEEDLE ASPIRATION BIOPSIES;  Surgeon: Garner Nash, DO;  Location: [place];  Service: Pulmonary;;   BRONCHIAL WASHINGS  08/31/2021   Procedure: BRONCHIAL WASHINGS;  Surgeon: Garner Nash, DO;  Location: [place];  Service: Pulmonary;;   BRONCHIAL WASHINGS  05/17/2022   Procedure: BRONCHIAL WASHINGS;  Surgeon: Garner Nash, DO;  Location: [place];  Service: Pulmonary;;   CESAREAN SECTION  1985; 1992   COLONOSCOPY WITH PROPOFOL N/A 09/23/2019   Procedure: COLONOSCOPY WITH PROPOFOL and EGD;  Surgeon: Otis Brace, MD;  Location: [facility];  Service: Gastroenterology;  Laterality: N/A;   DILATION AND CURETTAGE OF UTERUS     ESOPHAGOGASTRODUODENOSCOPY N/A 09/23/2019   Procedure: ESOPHAGOGASTRODUODENOSCOPY (EGD);  Surgeon: Otis Brace, MD;  Location: [facility];  Service: Gastroenterology;  Laterality: N/A;   FLEXIBLE SIGMOIDOSCOPY N/A 11/22/2019   Procedure: FLEXIBLE SIGMOIDOSCOPY;  Surgeon: [name], MD;  Location: [facility];  Service: General;  Laterality: N/A;   LUMBAR [place] SURGERY  2003   MASTECTOMY Left 12/05/2017   PROPHYLACTIC MASTECTOMY   MASTECTOMY COMPLETE / SIMPLE W/ SENTINEL NODE BIOPSY Right 12/05/2017   WITH RADIOACTIVE SEED TARGETED RIGHT AXIILARY LYMPH NODE EXCISION AND RIGHT SENTINEL LYMPH NODE BIOPSY,    MASTECTOMY WITH RADIOACTIVE SEED GUIDED EXCISION AND AXILLARY SENTINEL LYMPH NODE BIOPSY Bilateral 12/05/2017   Procedure: RIGHT SIMPLE MASTECTOMY WITH RADIOACTIVE SEED TARGETED RIGHT AXIILARY LYMPH NODE EXCISION AND RIGHT SENTINEL LYMPH NODE BIOPSY, LEFT PROPHYLACTIC MASTECTOMY;  Surgeon: [name], MD;  Location: [place];   Service: General;  Laterality: Bilateral;   SUBMUCOSAL TATTOO INJECTION  09/23/2019   Procedure: SUBMUCOSAL TATTOO INJECTION;  Surgeon: Otis Brace, MD;  Location[facility];  Service: Gastroenterology;;   VIDEO BRONCHOSCOPY WITH RADIAL ENDOBRONCHIAL ULTRASOUND  08/31/2021   Procedure: RADIAL ENDOBRONCHIAL ULTRASOUND;  Surgeon: Garner Nash, DO;  Location: [facility];  Service: Pulmonary;;     Family History Social History  Family History  Problem Relation Age of Onset   Dementia Mother    Lung cancer Father        smoked   Emphysema Father    Heart disease Maternal Grandfather    Cancer Maternal Aunt 53       breast cancer    Cancer Cousin        maternal first cousin with breast cancer in her 36s  Social History   Socioeconomic History   Marital status: Married    Spouse name: Not on file   Number of children: 4   Years of education: Not on file   Highest education level: Not on file  Occupational History   Not on file  Tobacco Use   Smoking status: Never   Smokeless tobacco: Never  Vaping Use   Vaping Use: Never used  Substance and Sexual Activity   Alcohol use: Yes    Alcohol/week: 7.0 standard drinks of alcohol    Types: 7 Glasses of wine per week    Comment: 2-3 3oz glasses of wine per day   Drug use: Never   Sexual activity: Not on file  Other Topics Concern   Not on file  Social History Narrative   Not on file   Social Determinants of Health   Financial Resource Strain: Not on file  Food Insecurity: Not on file  Transportation Needs: No Transportation Needs (05/03/2018)   PRAPARE - Hydrologist (Medical): No    Lack of Transportation (Non-Medical): No  Physical Activity: Not on file  Stress: Not on file  Social Connections: Not on file     Review of Systems: MSK: As noted per HPI above GI: No current Nausea/vomiting ENT: Denies sore throat, epistaxis CV: Denies chest pain Resp: No current shortness of  breath  Other than mentioned above, there are no Constitutional, Neurological, Psychiatric, ENT, Ophthalmological, Cardiovascular, Respiratory, GI, GU, Musculoskeletal, Integumentary, Lymphatic, Endocrine or Allergic issues.   Physical Examination: CV: Normal distal pulses Lungs: Unlabored respirations RLE: Skin intact and benign.  Tenderness to deep palpation around hip joint.  Pain with log roll and Stinchfield tests.  Painful limited range of motion.  +DF/PF/EHL.  SILT SP/DP/T.  2+ DP.  WWP distally.  Assessment/Plan: RIGHT TOTAL HIP ARTHROPLASTY ANTERIOR APPROACH    Georgeanna Harrison M.D. Orthopaedic Surgery [facility]  Review of this patient's medications prescribed by other providers does not in any way constitute an endorsement by this clinician of their use, indications, dosage, route, efficacy, interactions, or other clinical parameters.  Portions of the record have been created with voice recognition software.  Grammatical and punctuation errors, random word insertions, wrong-word or "sound-a-like" substitutions, pronoun errors (inaccuracies and/or substitutions), and/or incomplete sentences may have occurred due to the inherent limitations of voice recognition software.  Not all errors are caught or corrected.  Although every attempt is made to root out erroneous and incomplete transcription, the note may still not fully represent the intent or opinion of the author.  Read the chart carefully and recognize, using context, where errors/substitutions have occurred.  Any questions or concerns about the content of this note or information contained within the body of this dictation should be addressed directly with the author for clarification.

## 2022-07-12 NOTE — Transfer of Care (Signed)
Immediate Anesthesia Transfer of Care Note  Patient: Sonia Sandoval  Procedure(s) Performed: RIGHT TOTAL HIP ARTHROPLASTY ANTERIOR APPROACH (Right: Hip)  Patient Location: PACU  Anesthesia Type:General and Spinal  Level of Consciousness: drowsy  Airway & Oxygen Therapy: Patient Spontanous Breathing and Patient connected to nasal cannula oxygen  Post-op Assessment: Report given to RN and Post -op Vital signs reviewed and stable  Post vital signs: Reviewed and stable  Last Vitals:  Vitals Value Taken Time  BP 120/55 07/12/22 1556  Temp    Pulse 76 07/12/22 1558  Resp 15 07/12/22 1558  SpO2 99 % 07/12/22 1558  Vitals shown include unvalidated device data.  Last Pain:  Vitals:   07/12/22 0827  TempSrc:   PainSc: 0-No pain         Complications: No notable events documented.

## 2022-07-12 NOTE — Anesthesia Procedure Notes (Addendum)
Spinal  Patient location during procedure: OR Start time: 07/12/2022 10:24 AM End time: 07/12/2022 10:29 AM Reason for block: surgical anesthesia Staffing Performed: anesthesiologist  Anesthesiologist: Duane Boston, MD Performed by: Duane Boston, MD Authorized by: Duane Boston, MD   Preanesthetic Checklist Completed: patient identified, IV checked, risks and benefits discussed, surgical consent, monitors and equipment checked, pre-op evaluation and timeout performed Spinal Block Patient position: sitting Prep: DuraPrep Patient monitoring: cardiac monitor, continuous pulse ox and blood pressure Approach: midline Location: L2-3 Injection technique: single-shot Needle Needle type: Pencan  Needle gauge: 24 G Needle length: 9 cm Assessment Events: CSF return Additional Notes Functioning IV was confirmed and monitors were applied. Sterile prep and drape, including hand hygiene and sterile gloves were used. The patient was positioned and the spine was prepped. The skin was anesthetized with lidocaine.  Free flow of clear CSF was obtained prior to injecting local anesthetic into the CSF.  The spinal needle aspirated freely following injection.  The needle was carefully withdrawn.  The patient tolerated the procedure well.

## 2022-07-12 NOTE — Op Note (Addendum)
OPERATIVE NOTE  Sonia Sandoval female 72 y.o. 07/12/2022  PREOPERATIVE DIAGNOSIS: Right hip osteoarthritis   POSTOPERATIVE DIAGNOSIS: Right hip osteoarthritis (M16.11) Osteoporosis (M81.0)  PROCEDURE(S): Right anterior total hip arthroplasty [id number]) Operative use of fluoroscopy for above procedure(s) [id number])   SURGEON: Georgeanna Harrison, M.D.  ASSISTANT(S): Wandra Arthurs, RNFA  ANESTHESIA: Spinal, with general endotracheal  FINDINGS: Preoperative Examination: RLE: Skin intact and benign.  Tenderness to deep palpation around hip joint.  Pain with log roll and Stinchfield tests.  Painful limited range of motion.  +DF/PF/EHL.  SILT SP/DP/T.  2+ DP.  WWP distally.   Operative Findings: Slight leg length discrepancy noted on preoperative fluoroscopic evaluation, approximately 4 mm longer on the operative right side.  Osteoporosis with soft acetabular bone, requiring multihole screw cup for fixation.  Right anterior total hip arthroplasty with DePuy multihole 54 mm acetabular cup, DePuy neutral +4 poly, DePuy size 6 standard Actis femoral component, and DePuy 36 mm +12 ceramic head.  After final implantation of components, hip stable to shuck with good abductor tension, and no stability with 40 degrees extension and 90 degrees external rotation.  Slight postoperative increase in leg length discrepancy noted on final fluoroscopic evaluation, approximately 6 mm longer on the operative right side.  IMPLANTS: Implant Name Type Inv. Item Serial No. Manufacturer Lot No. LRB No. Used Action  SCREW 6.5MMX25MM - [id number] Screw SCREW 6.5MMX25MM  [employer] [id number] Right 1 Implanted  SCREW 6.5MMX25MM - [id number] Screw SCREW 6.5MMX25MM  [employer] [id number] Right 1 Implanted  CUP ACETAB PIN MULTI 54MM - [id number] Orthopedic Implant CUP ACETAB PIN MULTI 54MM  [employer]PU[employer] [id number] Right 1 Implanted  SCREW 6.5MMX35MM - [id number] Screw SCREW 6.5MMX35MM  [employer]PU[employer] [id number]  Right 1 Implanted  LINER NEUTRAL 54X36MM PLUS 4 - [id number] Hips LINER NEUTRAL 54X36MM PLUS 4  [employer]PU[employer]TH[employer] [id number] Right 1 Implanted  STEM FEMORAL SZ 6MM STD ACTIS - [id number] Stem STEM FEMORAL SZ 6MM STD ACTIS  [employer]PU[employer] ORTHOPA[employer]ICS [id number] Right 1 Implanted  HEAD CERAMIC BIO DELTA 36 - [id number] Hips HEAD CERAMIC BIO DELTA 36  [employer] [id number] Right 1 Implanted    INDICATIONS:  The patient is a 72 y.o. female with end-stage right hip osteoarthritis.  She experienced debilitating pain and symptoms despite exhaustive conservative treatment, and wished to pursue definitive surgical treatment in the form of total hip arthroplasty with an anterior approach.  She understood the risks, benefits and alternatives to surgery which include but are not limited to bleeding, wound healing complications, infection, damage to surrounding structures, persistent pain, stiffness, lack of improvement, and need for subsequent surgeries, as well as complications related to anesthesia, cardiovascular complications, and death.  Additional risks specific to arthroplasty were discussed including periprosthetic infection, periprosthetic fracture, and dislocation/instability, as well as the possibility for limb length discrepancy.  She also understood the potential for continued pain, and that there were no guarantees of acceptable outcome.  After weighing these risks the patient opted to proceed with surgery.  She also understands that I will be leaving at the end of March and will therefore not be able to see her throughout her postoperative period; nevertheless, she did still prefer that I perform her surgery as opposed to another physician.  She has recently been having some new issues with her left shoulder which sound like impingement/rotator cuff-related, so after I am gone we will plan to have her follow up with Dr. Rhona Raider for routine follow up for her right THA, in case she should benefit  from any  treatment with respect to the left shoulder.  TECHNIQUE: Patient was identified in the preoperative holding area.  The right hip was confirmed as the appropriate operative site and marked by me.  Consent was signed by myself and the patient and witnessed by the preoperative nurse.  No block was performed by anesthesia in the preoperative holding area.  Patient was taken to the operative suite and placed supine on the operative table.  Anesthesia was induced by the anesthesia team.  The patient was positioned appropriately for the procedure and all bony prominences were well padded.  A tourniquet was not used.  Preoperative antibiotics were given as well as TXA. The extremity was prepped and draped in the usual sterile fashion and surgical timeout was performed.  Preoperative fluoroscopic evaluation demonstrated preoperative leg length discrepancy of approximately 4 mm longer on the operative right side.  Surgery was performed on the Hana table.  Anterior surface anatomy was identified and marked out including the ASIS.  A 7 cm longitudinal incision was marked out 3 cm lateral and 3 cm distal to the ASIS.  Skin was incised sharply.  Underlying subcutaneous tissue was dissected with Bovie electrocautery down to the fascial layer.  The TFL sheath was identified.  This was split sharply proximally and distally.  The TFL muscle was retracted laterally.  Crossing branches of the lateral femoral circumflex artery were identified and ligated and divided.  An H capsulotomy was performed, and traction stitches were placed in each limb of the capsulotomy.  Retractors were placed superiorly and inferiorly on the femoral neck as well as anteriorly on the acetabulum.  The location for the neck cut was identified fluoroscopically.  Neck cut was performed with a sagittal saw and the head and neck were removed with a corkscrew.  Retractors were placed around the acetabulum and the labrum and pulmonary/ligamentum teres were  excised with cautery.  Sequential reaming was carried out up to 49 mm in order to accommodate a 50 mm cup.  Progression of reaming was monitored fluoroscopically.  A 50 mm GRIPTION cup was impacted into position under fluoroscopic guidance, and 2 screws were placed in the posterior superior quadrant.  However, upon impaction of the poly, the cup displaced medially and rotated into an unstable and unsatisfactory position.  Therefore the polyethylene and cup and screws were removed, and the socket was assessed both visually and fluoroscopically.  The rim remained intact, but there was a defect medially.  Given the soft poor bone quality, I elected to proceed by placing a larger size 54 mm multi screw cup.  Prior to placing this cup, I carefully reamed the acetabular rim with the 51 mm reamer under fluoroscopic guidance.  The 54 mm multihole cup was then impacted into position under fluoroscopic guidance with relative press-fit rim stability.  The cup was secured with 3 screws placed in the posterior superior quadrant with excellent purchase, using excellent stability of the acetabular component.  The acetabular component was thoroughly irrigated with pulsatile lavage, and a +4 neutral poly was seated and impacted into the acetabular component.  Anterior osteophytes were excised with a rondure.  Attention was then turned to preparation of the femur.  The proximal femur was delivered up through the incision.  The femoral canal was opened with the cookie cutter and then the canal finder.  Sequential broaching was carried out up to a 6 mm broach which achieved excellent stability.  Calcar was planed with a calcar planer, and we  proceeded with trialing.  We ultimately achieved excellent stability with a standard collar and +12 head, with smaller sizes resulting in instability at 40 degrees extension and 90 degrees external rotation.  Fluoroscopic evaluation of leg length discrepancy did reveal slight lengthening to leg  length discrepancy of 6 mm longer on the operative side which was consistent with preoperative templating, and was felt to be required for appropriate stability.  A size 6 standard offset stem was impacted into position after thoroughly irrigating the femoral canal, and trialing was repeated with the +12 head, once again resulting in excellent stability.  The femoral component Morse taper was irrigated and dried, and a +12 ceramic head was impacted into position, and the hip was reduced once again evaluated.  There was excellent stability to shock with excellent abductor tension, and no anterior instability and 40 degrees extension and 90 degrees external rotation.  The hip joint was then irrigated with Prontosan solution, which was allowed to set according to manufacturer instructions.  Solution was then suctioned, and the hip was thoroughly lavaged with normal saline pulsatile lavage.  The anterior capsulotomy was repaired with #1 PDS interrupted figure-of-eight stitches.  1 g vancomycin powder was placed deep in the wound.  The IT band sheath was closed with #1 Vicryl interrupted figure-of-eight stitches.  20 mL Exparel diluted with 20 mL normal saline was then infiltrated throughout the wound with an 18-gauge spinal needle.  The deep fat layer was closed with simple inverted interrupted #0 Vicryl, followed by simple inverted interrupted #2 Monocryl deep dermal, followed by running #3 Monocryl subcuticular.  Suture tails were secured with Steri-Strips.  Skin was sealed with Dermabond.  Aquacel dressing was placed over the incision.  Patient was awakened from anesthesia and transferred to PACU in stable condition.  She tolerated the procedure well.  No complications were noted intraoperatively.  POSTOPERATIVE INSTRUCTIONS: Mobility: Out of bed with PT/OT Pain control: Continue to wean/titrate to appropriate oral regimen DVT Prophylaxis: 81 mg aspirin twice daily for 2 weeks, then once daily for 2 more  weeks RLE: Toe-touch weightbearing for 6 weeks postoperatively Dressing care: Keep AQUACEL on and dry for up to 14 days.  Do not allow surgical area to get wet before that.  Remove AQUACEL dressing after 14 days and allow area to get wet in shower but DO NOT SUBMERGE until wound is evaluated in clinic.  In most cases skin glue is used and no additional dressing is necessary.  Disposition: PACU; overnight observation with likely discharge home 07/13/2022 Follow-up: Please call [facility] [phone]) to schedule follow-op appointment for 2 weeks after surgery.  TOURNIQUET TIME: * No tourniquets in log *  BLOOD LOSS: 700 mL         DRAINS: None         SPECIMEN: None       COMPLICATIONS:  * No complications entered in OR log *         DISPOSITION: PACU - hemodynamically stable.         CONDITION: stable   Georgeanna Harrison M.D. Orthopaedic Surgery [facility]   Portions of the record have been created with voice recognition software.  Grammatical and punctuation errors, random word insertions, wrong-word or "sound-a-like" substitutions, pronoun errors (inaccuracies and/or substitutions), and/or incomplete sentences may have occurred due to the inherent limitations of voice recognition software.  Not all errors are caught or corrected.  Although every attempt is made to root out erroneous and  incomplete transcription, the note may still not fully represent the intent or opinion of the author.  Read the chart carefully and recognize, using context, where errors/substitutions have occurred.  Any questions or concerns about the content of this note or information contained within the body of this dictation should be addressed directly with the author for clarification.

## 2022-07-13 ENCOUNTER — Other Ambulatory Visit (HOSPITAL_COMMUNITY): Payer: Self-pay

## 2022-07-13 ENCOUNTER — Encounter: Payer: Self-pay | Admitting: Hematology

## 2022-07-13 ENCOUNTER — Encounter (HOSPITAL_COMMUNITY): Payer: Self-pay | Admitting: Orthopedic Surgery

## 2022-07-13 LAB — POCT I-STAT EG7
Acid-base deficit: 5 mmol/L — ABNORMAL HIGH (ref 0.0–2.0)
Bicarbonate: 23.6 mmol/L (ref 20.0–28.0)
Calcium, Ion: 1.28 mmol/L (ref 1.15–1.40)
HCT: 34 % — ABNORMAL LOW (ref 36.0–46.0)
Hemoglobin: 11.6 g/dL — ABNORMAL LOW (ref 12.0–15.0)
O2 Saturation: 24 %
Patient temperature: 35
Potassium: 4.1 mmol/L (ref 3.5–5.1)
Sodium: 141 mmol/L (ref 135–145)
TCO2: 25 mmol/L (ref 22–32)
pCO2, Ven: 53.7 mmHg (ref 44–60)
pH, Ven: 7.241 — ABNORMAL LOW (ref 7.25–7.43)
pO2, Ven: 18 mmHg — CL (ref 32–45)

## 2022-07-13 LAB — GLUCOSE, CAPILLARY: Glucose-Capillary: 135 mg/dL — ABNORMAL HIGH (ref 70–99)

## 2022-07-13 MED ORDER — CHLORHEXIDINE GLUCONATE CLOTH 2 % EX PADS
6.0000 | MEDICATED_PAD | Freq: Every day | CUTANEOUS | Status: DC
  Administered 2022-07-13 – 2022-07-15 (×3): 6 via TOPICAL

## 2022-07-13 MED ORDER — PROMETHAZINE HCL 12.5 MG RE SUPP: 12.5000 mg | Freq: Four times a day (QID) | RECTAL | Status: DC | PRN

## 2022-07-13 MED ORDER — LACTATED RINGERS IV BOLUS
1000.0000 mL | Freq: Once | INTRAVENOUS | Status: AC
  Administered 2022-07-13: 1000 mL via INTRAVENOUS

## 2022-07-13 MED ORDER — ALPRAZOLAM 0.5 MG PO TABS: 0.5000 mg | ORAL_TABLET | Freq: Every day | ORAL | Status: DC | PRN

## 2022-07-13 MED ORDER — SODIUM CHLORIDE 0.9 % IV SOLN
12.5000 mg | Freq: Four times a day (QID) | INTRAVENOUS | Status: DC | PRN
  Administered 2022-07-13: 12.5 mg via INTRAVENOUS
  Filled 2022-07-13: qty 0.5

## 2022-07-13 MED ORDER — AZITHROMYCIN 250 MG PO TABS: 500.0000 mg | ORAL_TABLET | Freq: Every day | ORAL | Status: DC

## 2022-07-13 MED ORDER — PROMETHAZINE HCL 12.5 MG PO TABS
12.5000 mg | ORAL_TABLET | Freq: Four times a day (QID) | ORAL | Status: DC | PRN
  Filled 2022-07-13: qty 1

## 2022-07-13 MED ORDER — HYDROCODONE-ACETAMINOPHEN 5-325 MG PO TABS: 2.0000 | ORAL_TABLET | ORAL | Status: DC | PRN

## 2022-07-13 MED ORDER — HYDROCODONE-ACETAMINOPHEN 5-325 MG PO TABS
1.0000 | ORAL_TABLET | ORAL | 0 refills | Status: DC | PRN
  Filled 2022-07-13: qty 40, 4d supply, fill #0

## 2022-07-13 MED ORDER — OMADACYCLINE TOSYLATE 100 MG IV SOLR: 100.0000 mg | Freq: Every day | INTRAVENOUS | Status: DC

## 2022-07-13 MED ORDER — HYDROCODONE-ACETAMINOPHEN 5-325 MG PO TABS: 1.0000 | ORAL_TABLET | ORAL | Status: DC | PRN

## 2022-07-13 MED ORDER — GABAPENTIN 300 MG PO CAPS
300.0000 mg | ORAL_CAPSULE | Freq: Two times a day (BID) | ORAL | Status: DC
  Administered 2022-07-13 – 2022-07-15 (×5): 300 mg via ORAL
  Filled 2022-07-13 (×5): qty 1

## 2022-07-13 MED ORDER — PANTOPRAZOLE SODIUM 40 MG PO TBEC
40.0000 mg | DELAYED_RELEASE_TABLET | Freq: Every morning | ORAL | Status: DC
  Administered 2022-07-13 – 2022-07-15 (×3): 40 mg via ORAL
  Filled 2022-07-13 (×3): qty 1

## 2022-07-13 MED ORDER — EXEMESTANE 25 MG PO TABS
25.0000 mg | ORAL_TABLET | Freq: Every day | ORAL | Status: DC
  Administered 2022-07-14 – 2022-07-15 (×2): 25 mg via ORAL
  Filled 2022-07-13 (×2): qty 1

## 2022-07-13 NOTE — Progress Notes (Signed)
Pt requested to take out foley catheter after PT session. Will pass message on to day shift.

## 2022-07-13 NOTE — Evaluation (Signed)
Physical Therapy Evaluation Patient Details Name: Sonia Sandoval MRN: [id number] DOB: 09-05-1950 Today's Date: 07/13/2022  History of Present Illness  Pt is 72 yo female presenting with planned R hip arthroplasty. Currently pt is post op day 1 R THA. PMH: Anemia, anxiety, arthritis, breast cancer, colon cancer, COPD, Dyspnea, headaches, osteopenia.  Clinical Impression  Pt is presenting below baseline level of functioning. Currently pt requires assistance with bed mobility and sit to stand. She was unable to progress to transfers and gait due to presyncope and nausea; RN was notified. Due to pt current functional status, home set up and available assistance at home recommending skilled physical therapy services per physician recommendation on discharge from acute care hospital setting.        Recommendations for follow up therapy are one component of a multi-disciplinary discharge planning process, led by the attending physician.  Recommendations may be updated based on patient status, additional functional criteria and insurance authorization.  Follow Up Recommendations Follow physician's recommendations for discharge plan and follow up therapies      Assistance Recommended at Discharge Intermittent Supervision/Assistance  Patient can return home with the following  A lot of help with walking and/or transfers;Help with stairs or ramp for entrance;Assist for transportation;Assistance with cooking/housework    Equipment Recommendations Rolling walker (2 wheels)  Recommendations for Other Services       Functional Status Assessment Patient has had a recent decline in their functional status and demonstrates the ability to make significant improvements in function in a reasonable and predictable amount of time.     Precautions / Restrictions Precautions Precautions: None Restrictions Weight Bearing Restrictions: Yes RLE Weight Bearing: Touchdown weight bearing LLE Weight Bearing:  Touchdown weight bearing Other Position/Activity Restrictions: TDWB MD Concerned about acetabular bone integrity      Mobility  Bed Mobility Overal bed mobility: Needs Assistance Bed Mobility: Supine to Sit, Sit to Supine     Supine to sit: Supervision Sit to supine: Mod assist   General bed mobility comments: Pt requires supervision and assistance using gait belt to move the RLE off the EOB. Pt was Mod A at the bil LE to get back to supine due to significant nausea and feelings of pre-syncope Patient Response: Cooperative  Transfers Overall transfer level: Needs assistance Equipment used: Rolling walker (2 wheels) Transfers: Sit to/from Stand Sit to Stand: Min assist           General transfer comment: For balance after standing due to pre-syncope and nausea. Pt required verbal instruction for sfae sequencing to stand with decrease difficulty and to maintain WB precautions.    Ambulation/Gait               General Gait Details: Unable to progress today due to current nausea and pre-syncope      Balance Overall balance assessment: Needs assistance Sitting-balance support: No upper extremity supported, Bilateral upper extremity supported Sitting balance-Leahy Scale: Good Sitting balance - Comments: no overt LOB     Standing balance-Leahy Scale: Fair Standing balance comment: Min A due to recent WB restrictions and pre-syncope with standing.         Pertinent Vitals/Pain Pain Assessment Pain Assessment: 0-10 Pain Score: 4  Pain Location: R hip Pain Descriptors / Indicators: Aching, Cramping Pain Intervention(s): Monitored during session    Home Living Family/patient expects to be discharged to:: Private residence Living Arrangements: Spouse/significant other Available Help at Discharge: Available PRN/intermittently Type of Home: House Home Access: Stairs to enter Entrance Stairs-Rails: None  Entrance Stairs-Number of Steps: 1   Home Layout: One  level Home Equipment: Crutches;Grab bars - tub/shower;Cane - single point;Shower seat - built in;Toilet riser;Wheelchair - manual      Prior Function Prior Level of Function : Independent/Modified Independent;Driving             Mobility Comments: Ambulating without AD ADLs Comments: cooking, cleaning and perfoming all activities independently     Hand Dominance   Dominant Hand: Right    Extremity/Trunk Assessment   Upper Extremity Assessment Upper Extremity Assessment: Overall WFL for tasks assessed    Lower Extremity Assessment Lower Extremity Assessment: RLE deficits/detail RLE Deficits / Details: recent THA with TDW restrictions    Cervical / Trunk Assessment Cervical / Trunk Assessment: Normal  Communication   Communication: No difficulties  Cognition Arousal/Alertness: Awake/alert Behavior During Therapy: WFL for tasks assessed/performed Overall Cognitive Status: Within Functional Limits for tasks assessed        General Comments General comments (skin integrity, edema, etc.): Spouse present throughout session and states he is capable of assisting at home. Pt has friends and family that can come when her spouse is at work.        Assessment/Plan    PT Assessment Patient needs continued PT services  PT Problem List Decreased strength;Decreased mobility;Decreased balance;Pain       PT Treatment Interventions DME instruction;Functional mobility training;Balance training;Patient/family education;Gait training;Therapeutic activities;Neuromuscular re-education;Stair training;Therapeutic exercise;Manual techniques    PT Goals (Current goals can be found in the Care Plan section)  Acute Rehab PT Goals Patient Stated Goal: to return home PT Goal Formulation: With patient Time For Goal Achievement: 07/27/22 Potential to Achieve Goals: Good    Frequency 7X/week        AM-PAC PT "6 Clicks" Mobility  Outcome Measure Help needed turning from your back to  your side while in a flat bed without using bedrails?: A Little Help needed moving from lying on your back to sitting on the side of a flat bed without using bedrails?: A Little Help needed moving to and from a bed to a chair (including a wheelchair)?: A Little Help needed standing up from a chair using your arms (e.g., wheelchair or bedside chair)?: A Little Help needed to walk in hospital room?: A Lot Help needed climbing 3-5 steps with a railing? : A Lot 6 Click Score: 16    End of Session Equipment Utilized During Treatment: Gait belt Activity Tolerance: Other (comment) (limited by dizziness, nausea and presyncope) Patient left: in bed;with call bell/phone within reach;with family/visitor present Nurse Communication: Mobility status;Other (comment) (nausea/pre-syncope with standing) PT Visit Diagnosis: Other abnormalities of gait and mobility (R26.89)    Time: [id number] PT Time Calculation (min) (ACUTE ONLY): 33 min   Charges:   PT Evaluation $PT Eval Low Complexity: 1 Low PT Treatments $Therapeutic Activity: 8-22 mins      Tomma Rakers, DPT, CLT  Acute Rehabilitation Services Office: [phone] (Secure chat preferred)   Ander Purpura 07/13/2022, 11:06 AM

## 2022-07-13 NOTE — Discharge Summary (Addendum)
Patient ID: Sonia Sandoval MRN: [id number] DOB/AGE: 12/15/1950 73 y.o.  Admit date: 07/12/2022 Discharge date: 07/15/2022  Admission Diagnoses:  Principal Problem:   S/P total hip arthroplasty   Discharge Diagnoses:  Same  Past Medical History:  Diagnosis Date   Allergies 1995   Per patient report 11/14/18   Anemia    prior to finding colon cancer, no longer any issues with this per patient   Anxiety    Uses for sleep and long car rides   Arthritis    Breast cancer ([place])    Breast cancer, right breast ([place]) 09/2017   S/P mastectomy 12/05/2017   Colon cancer ([place])    COPD (chronic obstructive pulmonary disease) ([place])    pt reports previous MD diagnosed her with this, but takes no medications   Dyspnea    occasionally with exertion   Family history of breast cancer    Headache 1968   per patient report, started as a teenager   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   Mycobacterium infection    lung   Osteopenia 03/2018   Seen on DEXA Scan   PONV (postoperative nausea and vomiting)     Surgeries: Procedure(s): RIGHT TOTAL HIP ARTHROPLASTY ANTERIOR APPROACH on 07/12/2022   Consultants:   Discharged Condition: Improved  Hospital Course: CHRISTALYN STANTZ is an 72 y.o. female who was admitted 07/12/2022 for operative treatment ofS/P total hip arthroplasty. Patient has severe unremitting pain that affects sleep, daily activities, and work/hobbies. After pre-op clearance the patient was taken to the operating room on 07/12/2022 and underwent  Procedure(s): RIGHT TOTAL HIP ARTHROPLASTY ANTERIOR APPROACH.    Patient was given perioperative antibiotics:  Anti-infectives (From admission, onward)    Start     Dose/Rate Route Frequency Ordered Stop   07/13/22 1415  azithromycin (ZITHROMAX) tablet 500 mg        500 mg Oral Daily 07/13/22 1325     07/13/22 1400  azithromycin  (ZITHROMAX) tablet 500 mg  Status:  Discontinued        500 mg Oral Daily 07/13/22 1309 07/13/22 1325   07/13/22 1400  Omadacycline Tosylate SOLR 100 mg        100 mg Intravenous Daily 07/13/22 1309     07/12/22 2000  ceFAZolin (ANCEF) IVPB 1 g/50 mL premix        1 g 100 mL/hr over 30 Minutes Intravenous Every 6 hours 07/12/22 1551 07/13/22 0124   07/12/22 1451  vancomycin (VANCOCIN) powder  Status:  Discontinued          As needed 07/12/22 1451 07/12/22 1551   07/12/22 0815  ceFAZolin (ANCEF) IVPB 2g/100 mL premix        2 g 200 mL/hr over 30 Minutes Intravenous On call to O.R. 07/12/22 [id number] 07/12/22 1437        Patient was given sequential compression devices, early ambulation, and chemoprophylaxis to prevent DVT.  Patient benefited maximally from hospital stay and there were no complications.    Recent vital signs: Patient Vitals for the past 24 hrs:  BP Temp Temp src Pulse Resp SpO2  07/15/22 0733 (!) 158/62 98.2 F (36.8 C) Oral 82 17 99 %  07/15/22 0415 116/65 98.4 F (36.9 C) -- 83 18 98 %  07/14/22 2002 -- 98.9 F (37.2 C) Oral -- -- 99 %  07/14/22 1703 109/62 98.5 F (36.9  C) -- 89 16 --  07/14/22 1624 133/62 98.2 F (36.8 C) Oral 97 16 --  07/14/22 1448 127/67 98.3 F (36.8 C) Oral 89 17 100 %     Recent laboratory studies:  Recent Labs    07/12/22 1328 07/14/22 1142 07/15/22 0501  WBC  --  5.3 3.4*  HGB 11.6* 6.4* 7.0*  HCT 34.0* 19.5* 21.6*  PLT  --  169 176  NA 141  --   --   K 4.1  --   --      Discharge Medications:   Allergies as of 07/15/2022       Reactions   Tigecycline Diarrhea, Nausea And Vomiting   Codeine Nausea And Vomiting, Other (See Comments)   headache   Heparin Rash   Pt states she tolerates heparin flush for PICC but gets a full body rash with drip        Medication List     TAKE these medications    albuterol 108 (90 Base) MCG/ACT inhaler Commonly known as: VENTOLIN HFA TAKE 2 PUFFS BY MOUTH EVERY 6 HOURS AS NEEDED  FOR WHEEZE OR SHORTNESS OF BREATH   ALPRAZolam 0.5 MG dissolvable tablet Commonly known as: NIRAVAM Take 0.5 mg by mouth daily as needed (Sleep/Travel).   azithromycin 500 MG tablet Commonly known as: ZITHROMAX Take 500 mg by mouth daily.   CALCIUM 600+D PO Take 600 mg by mouth daily.   celecoxib 100 MG capsule Commonly known as: CeleBREX Take 1 capsule (100 mg total) by mouth 2 (two) times daily.   CLOFAZIMINE PO Take 100 mg by mouth daily.   cyanocobalamin 1000 MCG tablet Commonly known as: VITAMIN B12 Take 1,000 mcg by mouth daily. Chew   exemestane 25 MG tablet Commonly known as: AROMASIN Take 1 tablet (25 mg total) by mouth daily after breakfast.   gabapentin 300 MG capsule Commonly known as: NEURONTIN Take 300 mg by mouth 2 (two) times daily.   HYDROcodone-acetaminophen 5-325 MG tablet Commonly known as: NORCO/VICODIN Take 1-2 tablets by mouth every 4 (four) hours as needed for moderate pain or severe pain.   methocarbamol 500 MG tablet Commonly known as: ROBAXIN Take 1 tablet (500 mg total) by mouth every 8 (eight) hours as needed for muscle spasms.   Nuzyra 100 MG Solr Generic drug: Omadacycline Tosylate Inject 100 mg into the vein daily. Picc line   OVER THE COUNTER MEDICATION Take 1 tablet by mouth daily. Juice plus fruit blend   OVER THE COUNTER MEDICATION Take 1 capsule by mouth daily. Juice plus berry blend   OVER THE COUNTER MEDICATION Take 1 capsule by mouth daily. Juice plus veggie blend   pantoprazole 40 MG tablet Commonly known as: PROTONIX Take 40 mg by mouth every morning.   PROBIOTIC PO Take 2 capsules by mouth daily. 800 Million CFU   promethazine 12.5 MG tablet Commonly known as: PHENERGAN Take 1 tablet (12.5 mg total) by mouth every 6 (six) hours as needed for nausea or vomiting.   Vitamin D 50 MCG (2000 UT) tablet Take 2,000 Units by mouth daily.   vitamin E 180 MG (400 UNITS) capsule Take 400 Units by mouth daily.                Durable Medical Equipment  (From admission, onward)           Start     Ordered   07/14/22 1152  For home use only DME lightweight manual wheelchair with seat cushion  Once  Comments: Patient suffers from R hip arthroplasty which impairs their ability to perform daily activities like bathing, dressing  in the home.  A walker will not resolve  issue with performing activities of daily living. A wheelchair will allow patient to safely perform daily activities. Patient is not able to propel themselves in the home using a standard weight wheelchair due to general weakness. Patient can self propel in the lightweight wheelchair. Length of need 6 months . Accessories: elevating leg rests (ELRs), wheel locks, extensions and anti-tippers.   07/14/22 1153   07/13/22 1049  For home use only DME Walker rolling  Once       Question Answer Comment  Walker: With [place]   Patient needs a walker to treat with the following condition Gait abnormality      07/13/22 1050   07/13/22 1049  For home use only DME Bedside commode  Once       Comments: Confined to one  room  Question:  Patient needs a bedside commode to treat with the following condition  Answer:  Gait abnormality   07/13/22 1050   07/12/22 1551  DME Walker rolling  Once       Question Answer Comment  Walker: With 5 Inch Wheels   Patient needs a walker to treat with the following condition S/P total hip arthroplasty      07/12/22 1550            Diagnostic Studies: DG Pelvis Portable  Result Date: 07/12/2022 CLINICAL DATA:  Right hip replacement EXAM: PORTABLE PELVIS 1-2 VIEWS COMPARISON:  Radiograph dated July 12, 2018 FINDINGS: Status post right hip arthroplasty with intact hardware. No periprosthetic fracture. Subcutaneous emphysema as expected. Moderate left hip osteoarthritis. IMPRESSION: Status post right hip arthroplasty without evidence of acute complications. Electronically Signed   By: Keane Police  D.O.   On: 07/12/2022 16:50   DG HIP UNILAT WITH PELVIS 1V RIGHT  Result Date: 07/12/2022 CLINICAL DATA:  [id number] Elective surgery [id number] EXAM: DG HIP (WITH OR WITHOUT PELVIS) 1V RIGHT COMPARISON:  CT 08/06/2021 FINDINGS: A series of fluoroscopic spot images document which right hip arthroplasty. Femoral and acetabular components project in expected location on final images. No fracture or dislocation. Mild left hip DJD is noted. Bilateral pelvic phleboliths. The upper pelvis is not visualized. IMPRESSION: Right hip arthroplasty without apparent complication. Electronically Signed   By: Lucrezia Europe M.D.   On: 07/12/2022 15:19   DG C-Arm 1-60 Min-No Report  Result Date: 07/12/2022 Fluoroscopy was utilized by the requesting physician.  No radiographic interpretation.   DG C-Arm 1-60 Min-No Report  Result Date: 07/12/2022 Fluoroscopy was utilized by the requesting physician.  No radiographic interpretation.   DG C-Arm 1-60 Min-No Report  Result Date: 07/12/2022 Fluoroscopy was utilized by the requesting physician.  No radiographic interpretation.   DG C-Arm 1-60 Min-No Report  Result Date: 07/12/2022 Fluoroscopy was utilized by the requesting physician.  No radiographic interpretation.   DG Chest 2 View  Result Date: 07/12/2022 CLINICAL DATA:  Preoperative exam. EXAM: CHEST - 2 VIEW COMPARISON:  05/17/2022. FINDINGS: Unchanged left upper extremity PICC with tip terminating over the low SVC. Clear lungs. Normal heart size and mediastinal contours. No pleural effusion or pneumothorax. Visualized bones and upper abdomen are unremarkable. IMPRESSION: No evidence of acute cardiopulmonary disease. Electronically Signed   By: Emmit Alexanders M.D.   On: 07/12/2022 08:51    Disposition: Discharge disposition: 01-Home or Self Care  Discharge Instructions     Call MD / Call 911   Complete by: As directed    If you experience chest pain or shortness of breath, CALL 911 and be transported to  the hospital emergency room.  If you develope a fever above 101 F, pus (white drainage) or increased drainage or redness at the wound, or calf pain, call your surgeon's office.   Call MD / Call 911   Complete by: As directed    If you experience chest pain or shortness of breath, CALL 911 and be transported to the hospital emergency room.  If you develope a fever above 101 F, pus (white drainage) or increased drainage or redness at the wound, or calf pain, call your surgeon's office.   Constipation Prevention   Complete by: As directed    Drink plenty of fluids.  Prune juice may be helpful.  You may use a stool softener, such as Colace (over the counter) 100 mg twice a day.  Use MiraLax (over the counter) for constipation as needed.   Constipation Prevention   Complete by: As directed    Drink plenty of fluids.  Prune juice may be helpful.  You may use a stool softener, such as Colace (over the counter) 100 mg twice a day.  Use MiraLax (over the counter) for constipation as needed.   Diet - low sodium heart healthy   Complete by: As directed    Diet - low sodium heart healthy   Complete by: As directed    Increase activity slowly as tolerated   Complete by: As directed    Increase activity slowly as tolerated   Complete by: As directed    Post-operative opioid taper instructions:   Complete by: As directed    POST-OPERATIVE OPIOID TAPER INSTRUCTIONS: It is important to wean off of your opioid medication as soon as possible. If you do not need pain medication after your surgery it is ok to stop day one. Opioids include: Codeine, Hydrocodone(Norco, Vicodin), Oxycodone(Percocet, oxycontin) and hydromorphone amongst others.  Long term and even short term use of opiods can cause: Increased pain response Dependence Constipation Depression Respiratory depression And more.  Withdrawal symptoms can include Flu like symptoms Nausea, vomiting And more Techniques to manage these  symptoms Hydrate well Eat regular healthy meals Stay active Use relaxation techniques(deep breathing, meditating, yoga) Do Not substitute Alcohol to help with tapering If you have been on opioids for less than two weeks and do not have pain than it is ok to stop all together.  Plan to wean off of opioids This plan should start within one week post op of your joint replacement. Maintain the same interval or time between taking each dose and first decrease the dose.  Cut the total daily intake of opioids by one tablet each day Next start to increase the time between doses. The last dose that should be eliminated is the evening dose.      Post-operative opioid taper instructions:   Complete by: As directed    POST-OPERATIVE OPIOID TAPER INSTRUCTIONS: It is important to wean off of your opioid medication as soon as possible. If you do not need pain medication after your surgery it is ok to stop day one. Opioids include: Codeine, Hydrocodone(Norco, Vicodin), Oxycodone(Percocet, oxycontin) and hydromorphone amongst others.  Long term and even short term use of opiods can cause: Increased pain response Dependence Constipation Depression Respiratory depression And more.  Withdrawal symptoms can include Flu like symptoms Nausea, vomiting And  more Techniques to manage these symptoms Hydrate well Eat regular healthy meals Stay active Use relaxation techniques(deep breathing, meditating, yoga) Do Not substitute Alcohol to help with tapering If you have been on opioids for less than two weeks and do not have pain than it is ok to stop all together.  Plan to wean off of opioids This plan should start within one week post op of your joint replacement. Maintain the same interval or time between taking each dose and first decrease the dose.  Cut the total daily intake of opioids by one tablet each day Next start to increase the time between doses. The last dose that should be eliminated is  the evening dose.           Follow-up Information     Georgeanna Harrison, MD Follow up in 2 week(s).   Specialty: Orthopedic Surgery Contact information: [address] [place] [address] [phone]         [name], [place] Follow up.   Specialty: Nurse Practitioner Contact information: [place] [id number] [place], [facility] Follow up.   Specialty: Home Health Services Why: home health PT services will be provided by [facility], start of care within 48 hours post discharge Contact information: [address] [place] [address] [id number]                  Signed: Georgeanna Harrison 07/15/2022, 8:42 AM

## 2022-07-13 NOTE — Evaluation (Signed)
Occupational Therapy Evaluation Patient Details Name: Sonia Sandoval MRN: [id number] DOB: 04/28/1951 Today's Date: 07/13/2022   History of Present Illness Pt is 72 yo female presenting with planned R hip arthroplasty. Currently pt is post op day 1 R THA. PMH: Anemia, anxiety, arthritis, breast cancer, colon cancer, COPD, Dyspnea, headaches, osteopenia.   Clinical Impression   Sonia Sandoval was evaluated s/p the above admission list. She is indep at baseline. Upon evaluation she was limited by RLE pain and TDWB, decreased activity tolerance and orthostatic hypotension with syncope. Overall she requires min A for  transfers with RW and cues. Pt was educated on "hip kit" AE, and requires min A for LB ADLs. At the end of the session pt requested to ambulate to the restroom for BM, after hopping 75f with RW, pt reported dizziness and nausea. Pt placed in a hard back chair and then had a syncopal event and was unresponsive for ~1 minute. Pt dependently transferred from chair to bed; and was alert and talking in supine. Pt provided with bed pan. MD and team made aware. Pt will benefit from continued acute OT services. Recommend pt follow the physicians d/c plan.       Recommendations for follow up therapy are one component of a multi-disciplinary discharge planning process, led by the attending physician.  Recommendations may be updated based on patient status, additional functional criteria and insurance authorization.   Follow Up Recommendations  Follow physician's recommendations for discharge plan and follow up therapies     Assistance Recommended at Discharge Frequent or constant Supervision/Assistance  Patient can return home with the following A little help with walking and/or transfers;A little help with bathing/dressing/bathroom;Assistance with cooking/housework;Assist for transportation;Help with stairs or ramp for entrance    Functional Status Assessment  Patient has had a recent decline in their  functional status and demonstrates the ability to make significant improvements in function in a reasonable and predictable amount of time.  Equipment Recommendations  BSC/3in1 (RW)       Precautions / Restrictions Precautions Precautions: Fall Precaution Comments: watch BP Restrictions Weight Bearing Restrictions: Yes RLE Weight Bearing: Touchdown weight bearing Other Position/Activity Restrictions: TDWB MD Concerned about acetabular bone integrity      Mobility Bed Mobility Overal bed mobility: Needs Assistance Bed Mobility: Supine to Sit, Sit to Supine     Supine to sit: Supervision Sit to supine: Total assist   General bed mobility comments: total A to get back into the bed as pt fainted    Transfers Overall transfer level: Needs assistance Equipment used: Rolling walker (2 wheels) Transfers: Sit to/from Stand Sit to Stand: Min assist                  Balance Overall balance assessment: Needs assistance Sitting-balance support: No upper extremity supported, Bilateral upper extremity supported Sitting balance-Leahy Scale: Good       Standing balance-Leahy Scale: Fair                             ADL either performed or assessed with clinical judgement   ADL Overall ADL's : Needs assistance/impaired Eating/Feeding: Independent   Grooming: Set up;Sitting   Upper Body Bathing: Set up;Standing   Lower Body Bathing: Minimal assistance;Sit to/from stand   Upper Body Dressing : Set up;Sitting   Lower Body Dressing: Minimal assistance;Sit to/from stand   Toilet Transfer: Minimal assistance;Stand-pivot;BSC/3in1;Rolling walker (2 wheels)   Toileting- Clothing Manipulation and Hygiene: Min guard;Sitting/lateral  lean       Functional mobility during ADLs: Minimal assistance;Rolling walker (2 wheels) General ADL Comments: reviewed "hip kit" AE     Vision Baseline Vision/History: 0 No visual deficits Vision Assessment?: No apparent visual  deficits     Perception Perception Perception Tested?: No   Praxis Praxis Praxis tested?: Not tested    Pertinent Vitals/Pain Pain Assessment Pain Assessment: Faces Faces Pain Scale: Hurts little more Pain Location: R hip Pain Descriptors / Indicators: Aching, Cramping Pain Intervention(s): Limited activity within patient's tolerance, Monitored during session     Hand Dominance Right   Extremity/Trunk Assessment Upper Extremity Assessment Upper Extremity Assessment: Overall WFL for tasks assessed   Lower Extremity Assessment Lower Extremity Assessment: Defer to PT evaluation   Cervical / Trunk Assessment Cervical / Trunk Assessment: Normal   Communication Communication Communication: No difficulties   Cognition Arousal/Alertness: Awake/alert Behavior During Therapy: WFL for tasks assessed/performed Overall Cognitive Status: Within Functional Limits for tasks assessed             General Comments  [place] present and supportive. Pt requesting to go to the bathroom for BM, she reported dizziness after ~39f and was advised to sit. Once sitting, pt fainted. Depdently lifted to the bed            Home Living Family/patient expects to be discharged to:: Private residence Living Arrangements: Spouse/significant other Available Help at Discharge: Available PRN/intermittently Type of Home: House Home Access: Stairs to enter [employer] Steps: 1 Entrance Stairs-Rails: None Home Layout: One level     Bathroom Shower/Tub: WOccupational psychologist Standard Bathroom Accessibility: Yes   Home Equipment: Crutches;Grab bars - tub/shower;Cane - single point;Shower seat - built in;Toilet riser;Wheelchair - manual          Prior Functioning/Environment Prior Level of Function : Independent/Modified Independent;Driving             Mobility Comments: Ambulating without AD ADLs Comments: cooking, cleaning and perfoming all activities  independently        OT Problem List: Decreased strength;Decreased range of motion;Decreased activity tolerance;Impaired balance (sitting and/or standing);Decreased knowledge of precautions      OT Treatment/Interventions: Self-care/ADL training;Therapeutic exercise;DME and/or AE instruction;Therapeutic activities;Patient/family education;Balance training    OT Goals(Current goals can be found in the care plan section) Acute Rehab OT Goals Patient Stated Goal: to go home OT Goal Formulation: With patient Time For Goal Achievement: 07/13/22 Potential to Achieve Goals: Good ADL Goals Pt Will Perform Lower Body Dressing: with adaptive equipment;sit to/from stand;with supervision Pt Will Transfer to Toilet: with supervision;ambulating  OT Frequency: Min 2X/week       AM-PAC OT "6 Clicks" Daily Activity     Outcome Measure Help from another person eating meals?: None Help from another person taking care of personal grooming?: A Little Help from another person toileting, which includes using toliet, bedpan, or urinal?: A Little Help from another person bathing (including washing, rinsing, drying)?: A Little Help from another person to put on and taking off regular upper body clothing?: None Help from another person to put on and taking off regular lower body clothing?: A Little 6 Click Score: 20   End of Session Equipment Utilized During Treatment: Rolling walker (2 wheels) Nurse Communication: Mobility status  Activity Tolerance: Patient tolerated treatment well;Treatment limited secondary to medical complications (Comment) Patient left: in bed;with call bell/phone within reach;with family/visitor present  OT Visit Diagnosis: Unsteadiness on feet (R26.81);Other abnormalities of gait and mobility (R26.89);Muscle  weakness (generalized) (M62.81)                Time: [id number] OT Time Calculation (min): 45 min Charges:  OT General Charges $OT Visit: 1 Visit OT Evaluation $OT Eval  Moderate Complexity: 1 Mod OT Treatments $Self Care/Home Management : 23-37 mins  Shade Flood, OTR/L Acute Rehabilitation Services Office [phone] Secure Chat Communication Preferred   Elliot Cousin 07/13/2022, 2:42 PM

## 2022-07-13 NOTE — Progress Notes (Signed)
Orthopaedics Daily Progress Note   07/13/2022   8:21 AM  Sonia Sandoval is a 72 y.o. female 1 Day Post-Op s/p RIGHT TOTAL HIP ARTHROPLASTY ANTERIOR APPROACH  Subjective Awaiting PT.  Denies nausea, vomiting, or fevers. Pain well controlled.  Objective Vitals:   07/13/22 0401 07/13/22 0739  BP: (!) 115/48 96/60  Pulse: 92 82  Resp: 18   Temp: 98 F (36.7 C) 98.2 F (36.8 C)  SpO2: 100% 99%    Intake/Output Summary (Last 24 hours) at 07/13/2022 [id number] Last data filed at 07/13/2022 0449 Gross per 24 hour  Intake 500 ml  Output 1450 ml  Net -950 ml    Physical Exam RLE: Dressing clean, dry, and intact +DF/PF/EHL SILT SP/DP/T +DP/PT and WWP distally  Assessment 72 y.o. female s/p Procedure(s) (LRB): RIGHT TOTAL HIP ARTHROPLASTY ANTERIOR APPROACH (Right)  Plan Mobility: Out of bed with PT/OT Pain control: Continue to wean/titrate to appropriate oral regimen DVT Prophylaxis: 81 mg aspirin twice daily for 2 weeks, then once daily for 2 more weeks RLE: Toe-touch weightbearing for 6 weeks postoperatively--soft acetabular bone requiring screw fixation Dressing care: Keep AQUACEL on and dry for up to 14 days.  Do not allow surgical area to get wet before that.  Remove AQUACEL dressing after 14 days and allow area to get wet in shower but DO NOT SUBMERGE until wound is evaluated in clinic.  In most cases skin glue is used and no additional dressing is necessary.  Disposition: PACU; overnight observation with likely discharge home 07/13/2022 Follow-up: Please call [facility] [phone]) to schedule follow-op appointment for 2 weeks after surgery.   Georgeanna Harrison M.D. Orthopaedic Surgery [facility]

## 2022-07-13 NOTE — Progress Notes (Signed)
Physical Therapy Treatment Patient Details Name: Sonia Sandoval MRN: [id number] DOB: 09-03-1950 Today's Date: 07/13/2022   History of Present Illness Pt is 72 yo female presenting with planned R hip arthroplasty. Currently pt is post op day 1 R THA. PMH: Anemia, anxiety, arthritis, breast cancer, colon cancer, COPD, Dyspnea, headaches, osteopenia.    PT Comments    Pt is progressing towards goals. Currently pt requires assistance with bed mobility, sit to stand, short distance gait and stair navigation. Pt was able to navigate 1 step per home set up but has significant difficulty maintaining WB precautions despite max cueing. Due to pt current functional status, home set up and available assistance at home recommending skilled physical therapy services per physician recommendation on discharge from acute care hospital setting.       Recommendations for follow up therapy are one component of a multi-disciplinary discharge planning process, led by the attending physician.  Recommendations may be updated based on patient status, additional functional criteria and insurance authorization.  Follow Up Recommendations  Follow physician's recommendations for discharge plan and follow up therapies     Assistance Recommended at Discharge Intermittent Supervision/Assistance  Patient can return home with the following Help with stairs or ramp for entrance;Assist for transportation;Assistance with cooking/housework;A little help with walking and/or transfers   Equipment Recommendations  Rolling walker (2 wheels)    Recommendations for Other Services       Precautions / Restrictions Precautions Precautions: Fall Precaution Comments: watch BP Restrictions Weight Bearing Restrictions: Yes RLE Weight Bearing: Touchdown weight bearing Other Position/Activity Restrictions: TDWB MD Concerned about acetabular bone integrity     Mobility  Bed Mobility Overal bed mobility: Needs Assistance Bed  Mobility: Supine to Sit, Sit to Supine     Supine to sit: Supervision Sit to supine: Mod assist   General bed mobility comments: Pt is supervision for bed mobility with significant increase in time. Patient Response: Cooperative  Transfers Overall transfer level: Needs assistance Equipment used: Rolling walker (2 wheels) Transfers: Sit to/from Stand Sit to Stand: Min assist           General transfer comment: Pt continues to require verbal cues for safe sequencing and to maintain WB precautions. Pt states she is not placing weight through the RLE but visually pt has increased wgt through the leg with sit to stand as demonstrated by    Ambulation/Gait Ambulation/Gait assistance: Min assist Gait Distance (Feet): 5 Feet Assistive device: Rolling walker (2 wheels) Gait Pattern/deviations: Step-to pattern Gait velocity: very slow cadence. Gait velocity interpretation: <1.31 ft/sec, indicative of household ambulator   General Gait Details: Pt did well with maintaining WB precautions when walking fwd with turns pt continues to demonstrate increased WB through the RLE as evidenced by extension at the great toe without foot clearance when stepping.   Stairs Stairs: Yes Stairs assistance: Min assist Stair Management: Backwards, With walker Number of Stairs: 1 General stair comments: Pt had significant difficulty maintaining WB precautions during step up per home set up but was not attempted again due to presyncope and dizziness.        Balance Overall balance assessment: Needs assistance Sitting-balance support: No upper extremity supported, Bilateral upper extremity supported Sitting balance-Leahy Scale: Good Sitting balance - Comments: no overt LOB     Standing balance-Leahy Scale: Fair Standing balance comment: Min A due to recent WB restrictions and pre-syncope with standing.        Cognition Arousal/Alertness: Awake/alert Behavior During Therapy: WFL for tasks  assessed/performed Overall Cognitive Status: Within Functional Limits for tasks assessed       General Comments: slight decrease in safety awareness and not great at maintaining WB precautions.           General Comments General comments (skin integrity, edema, etc.): Husband present throughout session and states understanding of education and instruction for stair navigation.      Pertinent Vitals/Pain Pain Assessment Pain Assessment: Faces Pain Score: 4  Faces Pain Scale: Hurts even more Pain Location: R hip Pain Descriptors / Indicators: Aching, Cramping Pain Intervention(s): Monitored during session    Home Living Family/patient expects to be discharged to:: Private residence Living Arrangements: Spouse/significant other Available Help at Discharge: Available PRN/intermittently Type of Home: House Home Access: Stairs to enter Entrance Stairs-Rails: None Entrance Stairs-Number of Steps: 1   Home Layout: One level Home Equipment: Crutches;Grab bars - tub/shower;Cane - single point;Shower seat - built in;Toilet riser;Wheelchair - manual          PT Goals (current goals can now be found in the care plan section) Acute Rehab PT Goals Patient Stated Goal: to return home PT Goal Formulation: With patient Time For Goal Achievement: 07/27/22 Potential to Achieve Goals: Good Progress towards PT goals: Progressing toward goals    Frequency    7X/week      PT Plan Current plan remains appropriate       AM-PAC PT "6 Clicks" Mobility   Outcome Measure  Help needed turning from your back to your side while in a flat bed without using bedrails?: A Little Help needed moving from lying on your back to sitting on the side of a flat bed without using bedrails?: A Little Help needed moving to and from a bed to a chair (including a wheelchair)?: A Little Help needed standing up from a chair using your arms (e.g., wheelchair or bedside chair)?: A Little Help needed to  walk in hospital room?: A Lot Help needed climbing 3-5 steps with a railing? : A Lot 6 Click Score: 16    End of Session Equipment Utilized During Treatment: Gait belt Activity Tolerance: Other (comment) (limited by dizziness and presyncope) Patient left: in bed;with call bell/phone within reach;with family/visitor present Nurse Communication: Mobility status PT Visit Diagnosis: Other abnormalities of gait and mobility (R26.89)     Time: [id number] PT Time Calculation (min) (ACUTE ONLY): 15 min  Charges:  $Gait Training: 8-22 mins $Therapeutic Activity: 8-22 mins                     Tomma Rakers, DPT, [place] Office: [phone] (Secure chat preferred)    Ander Purpura 07/13/2022, 2:44 PM

## 2022-07-13 NOTE — TOC Initial Note (Signed)
Transition of Care [facility]) - Initial/Assessment Note    Patient Details  Name: Sonia Sandoval MRN: [id number] Date of Birth: 1950/06/19  Transition of [facility]) CM/SW Contact:    Sharin Mons, RN Phone Number: 07/13/2022, 11:08 AM  Clinical Narrative:                    S/p R TKA, 3/13  NCM @ bedside with pt to discuss TOC needs. Pt is from home with husband. States PTA independent with ADL's. Owns cane/ wheelchair. Per PT pt will benefit from HHPT services. Pt agreeable. Pt without provider preference. Referral made with Kerry Dory / [place] and accepted pending MD's order and face to face. Referral made with [facility] / [employer] for RW and [facility]. Equipment will be delivered to bedside prior to d/c. Husband states will assist with care once d/c.  Pt without any RX med concerns. Husband to provide transportation to home.  TOC team following and will continue to assist with TOC needs....  Expected Discharge Plan: [place] Barriers to Discharge: Continued Medical Work up   Patient Goals and CMS Choice     Choice offered to / list presented to : Patient      Expected Discharge Plan and Services       Living arrangements for the past 2 months: Single Family Home Expected Discharge Date: 07/13/22               DME Arranged: Berta Minor rolling DME Agency: [employer] Date DME Agency Contacted: 07/13/22 Time DME Agency Contacted: 38 Representative spoke with at DME Agency: Brenton Grills HH Arranged: PT [place]: [facility] Date [place]: 07/13/22 Time [place]: 59 Representative spoke with at [place]: Kerry Dory  Prior Living Arrangements/Services Living arrangements for the past 2 months: [place] Lives with:: Spouse Patient language and need for interpreter reviewed:: Yes Do you feel safe going back to the place where you live?: Yes      Need for Family Participation in Patient Care:  Yes (Comment) Care giver support system in place?: Yes (comment) Current home services: DME (cane, W/C) Criminal Activity/Legal Involvement Pertinent to Current Situation/Hospitalization: No - Comment as needed  Activities of Daily Living      Permission Sought/Granted   Permission granted to share information with : Yes, Verbal Permission Granted  Share Information with NAME: AHMARIE UN  Spouse  [id number]           Emotional Assessment Appearance:: Appears stated age Attitude/Demeanor/Rapport: Engaged Affect (typically observed): Accepting Orientation: : Oriented to Place, Oriented to Self, Oriented to  Time, Oriented to Situation Alcohol / Substance Use: Not Applicable Psych Involvement: No (comment)  Admission diagnosis:  S/P total hip arthroplasty [Z96.649] Patient Active Problem List   Diagnosis Date Noted   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  [place] [address] Phone: [phone] Fax: [place] [address] [place] [address] Phone: [phone] Fax: [phone]     Social Determinants of Health (SDOH) Social History: SDOH Screenings   Transportation Needs: No Transportation Needs (05/03/2018)  Depression (PHQ2-9): Low Risk  (06/16/2022)  Tobacco Use: Low Risk  (07/13/2022)   SDOH Interventions:      Readmission Risk Interventions     No data to display

## 2022-07-14 ENCOUNTER — Other Ambulatory Visit (HOSPITAL_COMMUNITY): Payer: Self-pay

## 2022-07-14 DIAGNOSIS — M810 Age-related osteoporosis without current pathological fracture: Secondary | ICD-10-CM | POA: Diagnosis present

## 2022-07-14 DIAGNOSIS — Z825 Family history of asthma and other chronic lower respiratory diseases: Secondary | ICD-10-CM | POA: Diagnosis not present

## 2022-07-14 DIAGNOSIS — Z79899 Other long term (current) drug therapy: Secondary | ICD-10-CM | POA: Diagnosis not present

## 2022-07-14 DIAGNOSIS — Z79811 Long term (current) use of aromatase inhibitors: Secondary | ICD-10-CM | POA: Diagnosis not present

## 2022-07-14 DIAGNOSIS — Z803 Family history of malignant neoplasm of breast: Secondary | ICD-10-CM | POA: Diagnosis not present

## 2022-07-14 DIAGNOSIS — D62 Acute posthemorrhagic anemia: Secondary | ICD-10-CM | POA: Diagnosis not present

## 2022-07-14 DIAGNOSIS — Z9012 Acquired absence of left breast and nipple: Secondary | ICD-10-CM | POA: Diagnosis not present

## 2022-07-14 DIAGNOSIS — J449 Chronic obstructive pulmonary disease, unspecified: Secondary | ICD-10-CM | POA: Diagnosis present

## 2022-07-14 DIAGNOSIS — F419 Anxiety disorder, unspecified: Secondary | ICD-10-CM | POA: Diagnosis present

## 2022-07-14 DIAGNOSIS — Z85038 Personal history of other malignant neoplasm of large intestine: Secondary | ICD-10-CM | POA: Diagnosis not present

## 2022-07-14 DIAGNOSIS — M21751 Unequal limb length (acquired), right femur: Secondary | ICD-10-CM | POA: Diagnosis present

## 2022-07-14 DIAGNOSIS — Z9013 Acquired absence of bilateral breasts and nipples: Secondary | ICD-10-CM | POA: Diagnosis not present

## 2022-07-14 DIAGNOSIS — Z853 Personal history of malignant neoplasm of breast: Secondary | ICD-10-CM | POA: Diagnosis not present

## 2022-07-14 DIAGNOSIS — I951 Orthostatic hypotension: Secondary | ICD-10-CM | POA: Diagnosis not present

## 2022-07-14 DIAGNOSIS — M1611 Unilateral primary osteoarthritis, right hip: Secondary | ICD-10-CM | POA: Diagnosis present

## 2022-07-14 DIAGNOSIS — Z8619 Personal history of other infectious and parasitic diseases: Secondary | ICD-10-CM | POA: Diagnosis not present

## 2022-07-14 LAB — CBC
HCT: 19.5 % — ABNORMAL LOW (ref 36.0–46.0)
Hemoglobin: 6.4 g/dL — CL (ref 12.0–15.0)
MCH: 31.2 pg (ref 26.0–34.0)
MCHC: 32.8 g/dL (ref 30.0–36.0)
MCV: 95.1 fL (ref 80.0–100.0)
Platelets: 169 10*3/uL (ref 150–400)
RBC: 2.05 MIL/uL — ABNORMAL LOW (ref 3.87–5.11)
RDW: 14.6 % (ref 11.5–15.5)
WBC: 5.3 10*3/uL (ref 4.0–10.5)
nRBC: 0 % (ref 0.0–0.2)

## 2022-07-14 LAB — PREPARE RBC (CROSSMATCH)

## 2022-07-14 MED ORDER — SODIUM CHLORIDE 0.9% IV SOLUTION: Freq: Once | INTRAVENOUS | Status: AC

## 2022-07-14 MED ORDER — METHOCARBAMOL 500 MG PO TABS
500.0000 mg | ORAL_TABLET | Freq: Three times a day (TID) | ORAL | 0 refills | Status: DC | PRN
  Filled 2022-07-14: qty 40, 14d supply, fill #0

## 2022-07-14 MED ORDER — METHOCARBAMOL 500 MG PO TABS: 500.0000 mg | ORAL_TABLET | Freq: Three times a day (TID) | ORAL | Status: DC | PRN

## 2022-07-14 MED ORDER — SODIUM CHLORIDE 0.9% FLUSH: 10.0000 mL | INTRAVENOUS | Status: DC | PRN

## 2022-07-14 NOTE — Progress Notes (Signed)
Occupational Therapy Evaluation Patient Details Name: Sonia Sandoval MRN: [id number] DOB: 03-24-1951 Today's Date: 07/14/2022   History of Present Illness Pt is 72 yo female presenting with planned R hip arthroplasty. Currently pt is post op day 1 R THA. PMH: Anemia, anxiety, arthritis, breast cancer, colon cancer, COPD, Dyspnea, headaches, osteopenia.   Clinical Impression   Pt limited in session due to severe fatigue from critical low levels of hemoglobin, blood transfusion to be performed later. Pt stated she attempted to walk out of room earlier with PT but was unable to, barely made it back into chair.  Pt educated on precautions, compensatory strategies with BSC and RW as Pt has obtained these items, and given handout and went over exercises for HEP today.  Pt motivated to participate and eager to return to home. Pt would benefit from continued skilled OT to assess if able to tolerate upright activities following blood transfusion.      Recommendations for follow up therapy are one component of a multi-disciplinary discharge planning process, led by the attending physician.  Recommendations may be updated based on patient status, additional functional criteria and insurance authorization.   Follow Up Recommendations  Follow physician's recommendations for discharge plan and follow up therapies     Assistance Recommended at Discharge Frequent or constant Supervision/Assistance  Patient can return home with the following A little help with walking and/or transfers;A little help with bathing/dressing/bathroom;Assistance with cooking/housework;Assist for transportation;Help with stairs or ramp for entrance    Functional Status Assessment     Equipment Recommendations  None recommended by OT (Pt. now has RW and BSC)    Recommendations for Other Services       Precautions / Restrictions Precautions Precautions: Fall Precaution Comments: watch BP Restrictions Weight Bearing  Restrictions: Yes RLE Weight Bearing: Touchdown weight bearing Other Position/Activity Restrictions: TDWB MD Concerned about acetabular bone integrity      Mobility Bed Mobility                    Transfers                          Balance                                           ADL either performed or assessed with clinical judgement   ADL Overall ADL's : Needs assistance/impaired                                             Vision         Perception     Praxis      Pertinent Vitals/Pain Pain Assessment Pain Assessment: 0-10 Pain Score: 4  Pain Location: R hip Pain Descriptors / Indicators: Aching, Operative site guarding Pain Intervention(s): Monitored during session, Repositioned     Hand Dominance     Extremity/Trunk Assessment Upper Extremity Assessment Upper Extremity Assessment: Overall WFL for tasks assessed           Communication     Cognition Arousal/Alertness: Awake/alert Behavior During Therapy: WFL for tasks assessed/performed Overall Cognitive Status: Within Functional Limits for tasks assessed  General Comments  Pt reporting dizziness once standing, however states that it is not as bad as earlier session. BP sitting 124/50 (72); standing 94/51 (64); sitting after gait trial 100/60 (73)    Exercises Exercises: General Upper Extremity General Exercises - Upper Extremity Shoulder Flexion: 15 reps, Theraband Theraband Level (Shoulder Flexion): Level 2 (Red) Shoulder Horizontal ABduction: 15 reps, Theraband Theraband Level (Shoulder Horizontal Abduction): Level 2 (Red) Elbow Flexion: 15 reps, Theraband Theraband Level (Elbow Flexion): Level 2 (Red) Elbow Extension: 15 reps, Theraband Theraband Level (Elbow Extension): Level 2 (Red)   Shoulder Instructions      Home Living                                           Prior Functioning/Environment                          OT Problem List:        OT Treatment/Interventions:      OT Goals(Current goals can be found in the care plan section) Acute Rehab OT Goals Patient Stated Goal: hopes blood transfusion decreases fatigue so she can return home OT Goal Formulation: With patient Time For Goal Achievement: 07/27/22 Potential to Achieve Goals: Good ADL Goals Pt Will Perform Lower Body Dressing: with adaptive equipment;sit to/from stand;with supervision Pt Will Transfer to Toilet: with supervision;ambulating  OT Frequency: Min 2X/week    Co-evaluation              AM-PAC OT "6 Clicks" Daily Activity     Outcome Measure Help from another person eating meals?: None Help from another person taking care of personal grooming?: A Little Help from another person toileting, which includes using toliet, bedpan, or urinal?: A Little Help from another person bathing (including washing, rinsing, drying)?: A Little Help from another person to put on and taking off regular upper body clothing?: None Help from another person to put on and taking off regular lower body clothing?: A Little 6 Click Score: 20   End of Session Nurse Communication: Precautions;Mobility status  Activity Tolerance: Patient limited by fatigue;Other (comment) (Pt. hemoglobin at critical level, blood transfusion to be completed later, did not attempt to stand from chair due to severe fatigue when standing) Patient left: in chair;with chair alarm set;with call bell/phone within reach;with family/visitor present  OT Visit Diagnosis: Unsteadiness on feet (R26.81);Other abnormalities of gait and mobility (R26.89);Muscle weakness (generalized) (M62.81)                Time: [id number] OT Time Calculation (min): 16 min Charges:  OT General Charges $OT Visit: 1 Visit OT Treatments $Therapeutic Exercise: 8-22 mins  [place] 07/14/2022, 2:16  PM

## 2022-07-14 NOTE — Progress Notes (Signed)
Physical Therapy Treatment Patient Details Name: Sonia Sandoval MRN: [id number] DOB: 1950-11-26 Today's Date: 07/14/2022   History of Present Illness Pt is 72 yo female presenting with planned R hip arthroplasty. Currently pt is post op day 1 R THA. PMH: Anemia, anxiety, arthritis, breast cancer, colon cancer, COPD, Dyspnea, headaches, osteopenia.    PT Comments    Pt was received in supine and agreeable to session. Pt reported mild dizziness once standing and orthostatics were taken as shown below. Pt able to perform short gait distance with reports of improved dizziness, however was limited by fatigue. Pt was able to maintain RLE WB precautions with cues due to pt appearing to WB more when fatigued. Educated pt and husband on assist techniques, home navigation, and activity pacing. Anticipate pt and husband will be able to manage pt's mobility needs at home once medically cleared.  Orthostatic BPs  Sitting 124/50  Standing 94/51  Sitting after gait trial 100/60       Recommendations for follow up therapy are one component of a multi-disciplinary discharge planning process, led by the attending physician.  Recommendations may be updated based on patient status, additional functional criteria and insurance authorization.  Follow Up Recommendations  Follow physician's recommendations for discharge plan and follow up therapies     Assistance Recommended at Discharge Intermittent Supervision/Assistance  Patient can return home with the following Help with stairs or ramp for entrance;Assist for transportation;Assistance with cooking/housework;A little help with walking and/or transfers   Equipment Recommendations  Rolling walker (2 wheels)    Recommendations for Other Services       Precautions / Restrictions Precautions Precautions: Fall Precaution Comments: watch BP Restrictions Weight Bearing Restrictions: Yes RLE Weight Bearing: Touchdown weight bearing Other Position/Activity  Restrictions: TDWB MD Concerned about acetabular bone integrity     Mobility  Bed Mobility Overal bed mobility: Needs Assistance Bed Mobility: Supine to Sit     Supine to sit: Supervision, HOB elevated     General bed mobility comments: Pt beginning and ending session in recliner    Transfers Overall transfer level: Needs assistance Equipment used: Rolling walker (2 wheels) Transfers: Sit to/from Stand Sit to Stand: Min guard   Step pivot transfers: Min assist       General transfer comment: Cues for RLE and hand placement    Ambulation/Gait Ambulation/Gait assistance: Min guard Gait Distance (Feet): 20 Feet Assistive device: Rolling walker (2 wheels) Gait Pattern/deviations: Step-to pattern, Trunk flexed       General Gait Details: Pt able to perform hop-to pattern with cues for maintaining RLE TDWB due to increased fatigue   Stairs         General stair comments: Discussed technique, but pt deferring trial due to fatigue      Balance Overall balance assessment: Needs assistance Sitting-balance support: No upper extremity supported, Bilateral upper extremity supported Sitting balance-Leahy Scale: Good Sitting balance - Comments: sitting in recliner   Standing balance support: Bilateral upper extremity supported, During functional activity, Reliant on assistive device for balance Standing balance-Leahy Scale: Fair Standing balance comment: with RW support                            Cognition Arousal/Alertness: Awake/alert Behavior During Therapy: WFL for tasks assessed/performed Overall Cognitive Status: Within Functional Limits for tasks assessed  Exercises Total Joint Exercises Ankle Circles/Pumps: AROM, Supine, Both, 5 reps Quad Sets: AROM, Supine, Both, 5 reps Heel Slides: AROM, Supine, Right, 5 reps    General Comments General comments (skin integrity, edema, etc.): Pt  reporting dizziness once standing, however states that it is not as bad as earlier session. BP sitting 124/50 (72); standing 94/51 (64); sitting after gait trial 100/60 (73)      Pertinent Vitals/Pain Pain Assessment Pain Assessment: Faces Faces Pain Scale: Hurts little more Pain Location: R hip Pain Descriptors / Indicators: Aching, Operative site guarding Pain Intervention(s): Limited activity within patient's tolerance, Monitored during session, Repositioned     PT Goals (current goals can now be found in the care plan section) Acute Rehab PT Goals Patient Stated Goal: to return home PT Goal Formulation: With patient Time For Goal Achievement: 07/27/22 Potential to Achieve Goals: Good Progress towards PT goals: Progressing toward goals    Frequency    7X/week      PT Plan Current plan remains appropriate       AM-PAC PT "6 Clicks" Mobility   Outcome Measure  Help needed turning from your back to your side while in a flat bed without using bedrails?: A Little Help needed moving from lying on your back to sitting on the side of a flat bed without using bedrails?: A Little Help needed moving to and from a bed to a chair (including a wheelchair)?: A Little Help needed standing up from a chair using your arms (e.g., wheelchair or bedside chair)?: A Little Help needed to walk in hospital room?: A Little Help needed climbing 3-5 steps with a railing? : A Lot 6 Click Score: 17    End of Session Equipment Utilized During Treatment: Gait belt Activity Tolerance: Patient limited by fatigue Patient left: in chair;with call bell/phone within reach;with family/visitor present Nurse Communication: Mobility status;Other (comment) (orthostatics) PT Visit Diagnosis: Other abnormalities of gait and mobility (R26.89)     Time: [id number] PT Time Calculation (min) (ACUTE ONLY): 31 min  Charges:  $Gait Training: 23-37 mins $Therapeutic Activity: 23-37 mins                      Michelle Nasuti, PTA Acute Rehabilitation Services Secure Chat Preferred  [phone]    Michelle Nasuti 07/14/2022, 12:49 PM

## 2022-07-14 NOTE — Plan of Care (Addendum)
Pt alert and oriented x 4. No syncope episodes when getting up to bsc. VS stable. Pt needed minimal assist to get to bsc uses gait belt to keep right leg from bearing weight. No pain meds given per pt request. Only scheduled gabapentin and celebrex. Orders for bladder scan d/c pt has voided x 3 since foley removed. Pt had large amount of urine when up to bsc. Small frequent urination when using bedpan on dayshift following foley removal.  Problem: Education: Goal: Knowledge of the prescribed therapeutic regimen will improve Outcome: Progressing Goal: Understanding of discharge needs will improve Outcome: Progressing Goal: Individualized Educational Video(s) Outcome: Progressing   Problem: Activity: Goal: Ability to avoid complications of mobility impairment will improve Outcome: Progressing Goal: Ability to tolerate increased activity will improve Outcome: Progressing   Problem: Clinical Measurements: Goal: Postoperative complications will be avoided or minimized Outcome: Progressing   Problem: Pain Management: Goal: Pain level will decrease with appropriate interventions Outcome: Progressing   Problem: Skin Integrity: Goal: Will show signs of wound healing Outcome: Progressing   Problem: Education: Goal: Knowledge of General Education information will improve Description: Including pain rating scale, medication(s)/side effects and non-pharmacologic comfort measures Outcome: Progressing   Problem: Health Behavior/Discharge Planning: Goal: Ability to manage health-related needs will improve Outcome: Progressing   Problem: Clinical Measurements: Goal: Ability to maintain clinical measurements within normal limits will improve Outcome: Progressing Goal: Will remain free from infection Outcome: Progressing Goal: Diagnostic test results will improve Outcome: Progressing Goal: Respiratory complications will improve Outcome: Progressing Goal: Cardiovascular complication will be  avoided Outcome: Progressing   Problem: Activity: Goal: Risk for activity intolerance will decrease Outcome: Progressing   Problem: Nutrition: Goal: Adequate nutrition will be maintained Outcome: Progressing   Problem: Coping: Goal: Level of anxiety will decrease Outcome: Progressing   Problem: Elimination: Goal: Will not experience complications related to bowel motility Outcome: Progressing Goal: Will not experience complications related to urinary retention Outcome: Progressing   Problem: Pain Managment: Goal: General experience of comfort will improve Outcome: Progressing   Problem: Safety: Goal: Ability to remain free from injury will improve Outcome: Progressing   Problem: Skin Integrity: Goal: Risk for impaired skin integrity will decrease Outcome: Progressing

## 2022-07-14 NOTE — Progress Notes (Signed)
Lab Reported critical hemoglobin of 6.4.  Sonia Harrison MD informed and was advised to give 1 unit of blood.

## 2022-07-14 NOTE — Progress Notes (Cosign Needed)
    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  A walker will not resolve  issue with performing activities of daily living. A wheelchair will allow patient to safely perform daily activities. Patient is not able to propel themselves in the home using a standard weight wheelchair due to general weakness. Patient can self propel in the lightweight wheelchair. Length of need 6 months . Accessories: elevating leg rests (ELRs), wheel locks, extensions and anti-tippers.   07/14/22 1153   07/13/22 1049  For home use only DME Walker rolling  Once       Question Answer Comment  Walker: With [place]   Patient needs a walker to treat with the following condition Gait abnormality      07/13/22 1050   07/13/22 1049  For home use only DME Bedside commode  Once       Comments: Confined to one  room  Question:  Patient needs a bedside commode to treat with the following condition  Answer:  Gait abnormality   07/13/22 1050   07/12/22 1551  DME Walker rolling  Once       Question Answer Comment  Walker: With 5 Inch Wheels   Patient needs a walker to treat with the following condition S/P total hip arthroplasty      07/12/22 1550

## 2022-07-14 NOTE — Plan of Care (Signed)

## 2022-07-14 NOTE — Progress Notes (Signed)
Physical Therapy Treatment Patient Details Name: Sonia Sandoval MRN: [id number] DOB: [birth year] Today's Date: 07/14/2022   History of Present Illness Pt is 72 yo female presenting with planned R hip arthroplasty. Currently pt is post op day 1 R THA. PMH: Anemia, anxiety, arthritis, breast cancer, colon cancer, COPD, Dyspnea, headaches, osteopenia.    PT Comments    Pt was received in supine and agreeable to session with husband present. Pt was able to progress bed mobility and standing with up to min guard for safety. Pt was able to perform a few hop-to steps with R knee flexed to maintain RLE TDWB, however pt reported increased dizziness and requested to sit in chair. Orthostatic BP was taken and further mobility was deferred due to soft BP in standing. Symptoms improved once pt was sitting in recliner. Plan to return for second session and progress gait as able. Pt continues to benefit from PT services to progress toward functional mobility goals.  Orthostatic BPs  Sitting 115/62 (77)  Standing 74/52 (60)  Sitting after transfer 99/57 (71)       Recommendations for follow up therapy are one component of a multi-disciplinary discharge planning process, led by the attending physician.  Recommendations may be updated based on patient status, additional functional criteria and insurance authorization.  Follow Up Recommendations  Follow physician's recommendations for discharge plan and follow up therapies     Assistance Recommended at Discharge Intermittent Supervision/Assistance  Patient can return home with the following Help with stairs or ramp for entrance;Assist for transportation;Assistance with cooking/housework;A little help with walking and/or transfers   Equipment Recommendations  Rolling walker (2 wheels)    Recommendations for Other Services       Precautions / Restrictions Precautions Precautions: Fall Precaution Comments: watch BP Restrictions Weight Bearing  Restrictions: Yes RLE Weight Bearing: Touchdown weight bearing Other Position/Activity Restrictions: TDWB MD Concerned about acetabular bone integrity     Mobility  Bed Mobility Overal bed mobility: Needs Assistance Bed Mobility: Supine to Sit     Supine to sit: Supervision, HOB elevated     General bed mobility comments: increased time    Transfers Overall transfer level: Needs assistance Equipment used: Rolling walker (2 wheels) Transfers: Sit to/from Stand, Bed to chair/wheelchair/BSC Sit to Stand: Min guard   Step pivot transfers: Min assist       General transfer comment: Pt with good power up and able to maintain RLE TDWB with cues. Pt able to maintain balance and follow cues to bend R knee to keep R foot off of the floor without use of a gait belt. Pt reporting dizziness after a few steps and requesting to sit in recliner, requiring min A for RW management and balance.    Ambulation/Gait               General Gait Details: Unable to progress due to dizziness      Balance Overall balance assessment: Needs assistance Sitting-balance support: No upper extremity supported, Bilateral upper extremity supported Sitting balance-Leahy Scale: Good Sitting balance - Comments: sitting EOB   Standing balance support: Bilateral upper extremity supported, During functional activity, Reliant on assistive device for balance Standing balance-Leahy Scale: Fair Standing balance comment: with RW support                            Cognition Arousal/Alertness: Awake/alert Behavior During Therapy: WFL for tasks assessed/performed Overall Cognitive Status: Within Functional Limits for tasks assessed  Exercises Total Joint Exercises Ankle Circles/Pumps: AROM, Supine, Both, 5 reps Quad Sets: AROM, Supine, Both, 5 reps Heel Slides: AROM, Supine, Right, 5 reps    General Comments General comments (skin  integrity, edema, etc.): Pt reporting increased dizziness in standing. BP sitting 115/62 (77); standing 74/52 (60); sitting after transfer 99/57 (71)      Pertinent Vitals/Pain Pain Assessment Pain Assessment: Faces Faces Pain Scale: Hurts even more Pain Location: R hip Pain Descriptors / Indicators: Aching, Operative site guarding Pain Intervention(s): Limited activity within patient's tolerance, Monitored during session, Repositioned     PT Goals (current goals can now be found in the care plan section) Acute Rehab PT Goals Patient Stated Goal: to return home PT Goal Formulation: With patient Time For Goal Achievement: 07/27/22 Potential to Achieve Goals: Good Progress towards PT goals: Progressing toward goals    Frequency    7X/week      PT Plan Current plan remains appropriate       AM-PAC PT "6 Clicks" Mobility   Outcome Measure  Help needed turning from your back to your side while in a flat bed without using bedrails?: A Little Help needed moving from lying on your back to sitting on the side of a flat bed without using bedrails?: A Little Help needed moving to and from a bed to a chair (including a wheelchair)?: A Little Help needed standing up from a chair using your arms (e.g., wheelchair or bedside chair)?: A Little Help needed to walk in hospital room?: A Little Help needed climbing 3-5 steps with a railing? : A Lot 6 Click Score: 17    End of Session Equipment Utilized During Treatment: Gait belt Activity Tolerance: Other (comment) (limited by dizziness) Patient left: in chair;with call bell/phone within reach;with family/visitor present Nurse Communication: Mobility status (orthostatics) PT Visit Diagnosis: Other abnormalities of gait and mobility (R26.89)     Time: [id number] PT Time Calculation (min) (ACUTE ONLY): 27 min  Charges:  $Therapeutic Activity: 23-37 mins                     Michelle Nasuti, PTA Acute Rehabilitation Services Secure  Chat Preferred  [phone]    Michelle Nasuti 07/14/2022, 10:51 AM

## 2022-07-14 NOTE — Progress Notes (Signed)
Orthopaedics Daily Progress Note   07/14/2022   10:53 AM  [name] is a 72 y.o. female 2 Days Post-Op s/p RIGHT TOTAL HIP ARTHROPLASTY ANTERIOR APPROACH  Subjective A little lightheaded with PT.  Experiencing some orthostatic hypotension.  Denies nausea, vomiting, or fevers. Pain well controlled.  Objective Vitals:   07/14/22 0451 07/14/22 0816  BP: (!) 132/56 (!) 139/54  Pulse: 96 84  Resp: 17 17  Temp: 98.2 F (36.8 C) 99.3 F (37.4 C)  SpO2: 99% 99%    Intake/Output Summary (Last 24 hours) at 07/14/2022 1053 Last data filed at 07/13/2022 1737 Gross per 24 hour  Intake 1050 ml  Output 234 ml  Net 816 ml     Physical Exam RLE: Dressing clean, dry, and intact +DF/PF/EHL SILT SP/DP/T +DP/PT and WWP distally  Assessment 72 y.o. female s/p Procedure(s) (LRB): RIGHT TOTAL HIP ARTHROPLASTY ANTERIOR APPROACH (Right)  Plan Check CBC; plan transfusion if indicated Encourage PO intake Mobility: Out of bed with PT/OT Pain control: Continue to wean/titrate to appropriate oral regimen DVT Prophylaxis: 81 mg aspirin twice daily for 2 weeks, then once daily for 2 more weeks RLE: Toe-touch weightbearing for 6 weeks postoperatively--soft acetabular bone requiring screw fixation Dressing care: Keep AQUACEL on and dry for up to 14 days.  Do not allow surgical area to get wet before that.  Remove AQUACEL dressing after 14 days and allow area to get wet in shower but DO NOT SUBMERGE until wound is evaluated in clinic.  In most cases skin glue is used and no additional dressing is necessary.  Disposition: PACU; overnight observation with likely discharge home 07/13/2022 Follow-up: Please call [facility] [phone]) to schedule follow-op appointment for 2 weeks after surgery.   Georgeanna Harrison M.D. Orthopaedic Surgery [facility]

## 2022-07-15 LAB — TYPE AND SCREEN
ABO/RH(D): A POS
Antibody Screen: NEGATIVE
Unit division: 0

## 2022-07-15 LAB — CBC
HCT: 21.6 % — ABNORMAL LOW (ref 36.0–46.0)
Hemoglobin: 7 g/dL — ABNORMAL LOW (ref 12.0–15.0)
MCH: 29.7 pg (ref 26.0–34.0)
MCHC: 32.4 g/dL (ref 30.0–36.0)
MCV: 91.5 fL (ref 80.0–100.0)
Platelets: 176 10*3/uL (ref 150–400)
RBC: 2.36 MIL/uL — ABNORMAL LOW (ref 3.87–5.11)
RDW: 17.4 % — ABNORMAL HIGH (ref 11.5–15.5)
WBC: 3.4 10*3/uL — ABNORMAL LOW (ref 4.0–10.5)
nRBC: 0 % (ref 0.0–0.2)

## 2022-07-15 LAB — BPAM RBC
Blood Product Expiration Date: 202404092359
ISSUE DATE / TIME: 202403141640
Unit Type and Rh: 6200

## 2022-07-15 MED ORDER — HEPARIN SOD (PORK) LOCK FLUSH 100 UNIT/ML IV SOLN
250.0000 [IU] | INTRAVENOUS | Status: AC | PRN
  Administered 2022-07-15: 250 [IU]

## 2022-07-15 NOTE — Progress Notes (Signed)
Physical Therapy Treatment Patient Details Name: Sonia Sandoval MRN: [id number] DOB: 08-19-1950 Today's Date: 07/15/2022   History of Present Illness Pt is 72 yo female presenting with planned R hip arthroplasty. Currently pt is post op day 1 R THA. PMH: Anemia, anxiety, arthritis, breast cancer, colon cancer, COPD, Dyspnea, headaches, osteopenia.    PT Comments    Pt was received in supine and agreeable to session with husband present. Pt reported improved dizziness this session. Pt was able to perform short gait distance in room, however was limited by fatigue and instance of sharp pain in R hip that pt required a seated break to recover from. Pt able to perform seated exercises, however was not able to perform seated marches with RLE due to weakness vs decreased muscle activation. Educated pt and husband on importance of activity pacing for safety. Anticipate pt and husband will be able to manage pt's mobility needs at home.    Recommendations for follow up therapy are one component of a multi-disciplinary discharge planning process, led by the attending physician.  Recommendations may be updated based on patient status, additional functional criteria and insurance authorization.  Follow Up Recommendations  Follow physician's recommendations for discharge plan and follow up therapies     Assistance Recommended at Discharge Intermittent Supervision/Assistance  Patient can return home with the following Help with stairs or ramp for entrance;Assist for transportation;Assistance with cooking/housework;A little help with walking and/or transfers   Equipment Recommendations  Rolling walker (2 wheels)    Recommendations for Other Services       Precautions / Restrictions Precautions Precautions: Fall Precaution Comments: watch BP Restrictions Weight Bearing Restrictions: Yes RLE Weight Bearing: Touchdown weight bearing Other Position/Activity Restrictions: TDWB MD Concerned about  acetabular bone integrity     Mobility  Bed Mobility Overal bed mobility: Needs Assistance Bed Mobility: Supine to Sit     Supine to sit: Supervision     General bed mobility comments: Pt using BUEs to assist RLE to EOB. Supervision for safety    Transfers Overall transfer level: Needs assistance Equipment used: Rolling walker (2 wheels) Transfers: Sit to/from Stand Sit to Stand: Min guard           General transfer comment: Cues for RLE placement and min guard for safety    Ambulation/Gait Ambulation/Gait assistance: Supervision Gait Distance (Feet): 20 Feet Assistive device: Rolling walker (2 wheels) Gait Pattern/deviations: Step-to pattern, Trunk flexed Gait velocity: slow     General Gait Details: Pt performing hop-to pattern and able to maintain RLE TDWB. Pt requiring one seated recovery break due to increased RLE pain that improved with sitting.      Balance Overall balance assessment: Needs assistance Sitting-balance support: No upper extremity supported, Bilateral upper extremity supported Sitting balance-Leahy Scale: Good Sitting balance - Comments: sitting in recliner   Standing balance support: Bilateral upper extremity supported, During functional activity, Reliant on assistive device for balance Standing balance-Leahy Scale: Fair Standing balance comment: with RW support                            Cognition Arousal/Alertness: Awake/alert Behavior During Therapy: WFL for tasks assessed/performed Overall Cognitive Status: Within Functional Limits for tasks assessed                                          Exercises Total  Joint Exercises Quad Sets: AROM, 5 reps, Right, Seated Heel Slides: AROM, Right, 5 reps, Seated Long Arc Quad: AROM, Right, Seated, 5 reps    General Comments General comments (skin integrity, edema, etc.): Pt reports mild dizziness upon standing that resolved quickly.      Pertinent  Vitals/Pain Pain Assessment Pain Assessment: Faces Faces Pain Scale: Hurts whole lot Pain Location: R hip Pain Descriptors / Indicators: Aching, Operative site guarding Pain Intervention(s): Limited activity within patient's tolerance, Monitored during session, Repositioned     PT Goals (current goals can now be found in the care plan section) Acute Rehab PT Goals Patient Stated Goal: to return home PT Goal Formulation: With patient Time For Goal Achievement: 07/27/22 Potential to Achieve Goals: Good Progress towards PT goals: Progressing toward goals    Frequency    7X/week      PT Plan Current plan remains appropriate       AM-PAC PT "6 Clicks" Mobility   Outcome Measure  Help needed turning from your back to your side while in a flat bed without using bedrails?: A Little Help needed moving from lying on your back to sitting on the side of a flat bed without using bedrails?: A Little Help needed moving to and from a bed to a chair (including a wheelchair)?: A Little Help needed standing up from a chair using your arms (e.g., wheelchair or bedside chair)?: A Little Help needed to walk in hospital room?: A Little Help needed climbing 3-5 steps with a railing? : A Lot 6 Click Score: 17    End of Session   Activity Tolerance: Patient limited by fatigue Patient left: in chair;with call bell/phone within reach;with family/visitor present Nurse Communication: Mobility status PT Visit Diagnosis: Other abnormalities of gait and mobility (R26.89)     Time: [id number] PT Time Calculation (min) (ACUTE ONLY): 25 min  Charges:  $Gait Training: 8-22 mins $Therapeutic Exercise: 8-22 mins                     Michelle Nasuti, PTA Acute Rehabilitation Services Secure Chat Preferred  [phone]    Michelle Nasuti 07/15/2022, 10:27 AM

## 2022-07-15 NOTE — Progress Notes (Signed)
Orthopaedics Daily Progress Note   07/15/2022   8:38 AM  Sonia Sandoval is a 72 y.o. female 3 Days Post-Op s/p RIGHT TOTAL HIP ARTHROPLASTY ANTERIOR APPROACH  Subjective Transfused 1 unit PRBC yesterday.  Feeling much better today--no lightheadedness.  Denies nausea, vomiting, or fevers. Pain well controlled.  Objective Vitals:   07/15/22 0415 07/15/22 0733  BP: 116/65 (!) 158/62  Pulse: 83 82  Resp: 18 17  Temp: 98.4 F (36.9 C) 98.2 F (36.8 C)  SpO2: 98% 99%    Intake/Output Summary (Last 24 hours) at 07/15/2022 [id number] Last data filed at 07/14/2022 1800 Gross per 24 hour  Intake 12.5 ml  Output --  Net 12.5 ml     Physical Exam RLE: Dressing clean, dry, and intact +DF/PF/EHL SILT SP/DP/T +DP/PT and WWP distally  Assessment 72 y.o. female s/p Procedure(s) (LRB): RIGHT TOTAL HIP ARTHROPLASTY ANTERIOR APPROACH (Right)  Acute blood loss anemia related to recent surgery  Plan Continue to encourage PO intake Plan for DC home today Mobility: Out of bed with PT/OT Pain control: Continue to wean/titrate to appropriate oral regimen DVT Prophylaxis: 81 mg aspirin twice daily for 2 weeks, then once daily for 2 more weeks RLE: Toe-touch weightbearing for 6 weeks postoperatively--soft acetabular bone requiring screw fixation Dressing care: Keep AQUACEL on and dry for up to 14 days.  Do not allow surgical area to get wet before that.  Remove AQUACEL dressing after 14 days and allow area to get wet in shower but DO NOT SUBMERGE until wound is evaluated in clinic.  In most cases skin glue is used and no additional dressing is necessary.  Disposition: PACU; overnight observation with likely discharge home 07/13/2022 Follow-up: Please call [facility] [phone]) to schedule follow-op appointment for 2 weeks after surgery.   Georgeanna Harrison M.D. Orthopaedic Surgery [facility]

## 2022-07-15 NOTE — Progress Notes (Signed)
Per Dr. Mable Fill pt is leaving with PICC since she came in with it to the hospital.

## 2022-07-19 ENCOUNTER — Telehealth: Payer: Self-pay

## 2022-07-19 NOTE — Telephone Encounter (Signed)
OPAT extension orders for additional one week given to Carolynn Sayers, RN with [employer] per Dr. Gale Journey.  Beryle Flock, RN

## 2022-07-20 NOTE — Telephone Encounter (Signed)
Please see message . Thank you .

## 2022-07-22 ENCOUNTER — Telehealth: Payer: Self-pay | Admitting: Pharmacist

## 2022-07-22 NOTE — Telephone Encounter (Signed)
Sounds good

## 2022-07-22 NOTE — Telephone Encounter (Signed)
Received fax today from [facility] stating [place] appeal was denied because they needed C&S data. Spoke with customer service representative over the phone today discussing that this was faxed to them on 06/29/22. She found the attachment in their patient profile and will send back over to the PA team for review.  Discussed that this is an urgent matter as patient continues on IV Nuzyra with a PICC line in place which presents its own complications and risks to the patient. She labeled the request as urgent. Stated we should expect a call back from the team today.   Alfonse Spruce, PharmD, CPP, BCIDP, [place] Clinical Pharmacist Practitioner Infectious [place] for Infectious Disease

## 2022-07-22 NOTE — Telephone Encounter (Signed)
Patient's hemoglobin resulted low at 6.6 from faxed labs on 3/19. Recently underwent right THA. Please advise.  Alfonse Spruce, PharmD, CPP, BCIDP, [place] Clinical Pharmacist Practitioner Infectious [place] for Infectious Disease

## 2022-07-26 ENCOUNTER — Ambulatory Visit (HOSPITAL_COMMUNITY): Payer: Commercial Managed Care - PPO

## 2022-08-01 ENCOUNTER — Telehealth: Payer: Self-pay

## 2022-08-01 NOTE — Telephone Encounter (Signed)
Kuppelweiser, Gillian Shields, RPH-CPP  Leary Roca, LPN; Brooks Sailors, Diminique T, [place]; Onnie Alatorre, Truett Mainland, RN; Speight, Tiffany P, CMA; 1 other  Please extend another week. We have been on the phone all week trying to get PO approved. Still trying.       Previous Messages    ----- Message ----- From: Larry Sierras Sent: 07/29/2022   9:37 AM EDT To: Darletta Moll, RPH-CPP; * Subject: Renelda Mom                                  Just wanted to get an update on Mrs. Westfall's transition to PO regimen. She has completed the current orders pending auth for po regimen.  Just let us know how to proceed. Teah, will advise the pt we will connect when you are back in he office on Monday.  Thanks. Pam

## 2022-08-04 ENCOUNTER — Other Ambulatory Visit (HOSPITAL_COMMUNITY): Payer: Self-pay

## 2022-08-04 ENCOUNTER — Telehealth: Payer: Self-pay

## 2022-08-04 NOTE — Telephone Encounter (Signed)
[facility] Patient Advocate Encounter  Prior Authorization for Elesa Hacker has been approved.    PA# [id number] Effective dates: 08/04/22 through 02/03/23  Patients co-pay is $0.00.   [facility] will continue to follow.  Ileene Patrick, [facility] Patient [facility] for Infectious Disease Phone: [phone] Fax:  [phone]

## 2022-08-04 NOTE — Telephone Encounter (Signed)
It's a miracle!

## 2022-08-05 ENCOUNTER — Encounter: Payer: Self-pay | Admitting: Pharmacist

## 2022-08-05 ENCOUNTER — Telehealth: Payer: Self-pay

## 2022-08-05 ENCOUNTER — Telehealth: Payer: Self-pay | Admitting: Pharmacist

## 2022-08-05 ENCOUNTER — Other Ambulatory Visit: Payer: Self-pay | Admitting: Pharmacist

## 2022-08-05 DIAGNOSIS — A318 Other mycobacterial infections: Secondary | ICD-10-CM

## 2022-08-05 MED ORDER — OMADACYCLINE TOSYLATE 150 MG PO TABS: 300.0000 mg | ORAL_TABLET | Freq: Every day | ORAL | 5 refills | Status: DC

## 2022-08-05 NOTE — Telephone Encounter (Signed)
Spoke with Jeri Modena and Tia through [employer] to have PICC pulled ASAP after last dose of IV omadacycline this Sunday (08/07/22) per Dr. Renold Don as patient's oral omadacycline has been approved. Will send oral omadacycline rx as soon as pharmacy team speaks with patient for preferred pharmacy.   Margarite Gouge, PharmD, CPP, BCIDP, AAHIVP Clinical Pharmacist Practitioner Infectious Diseases Clinical Pharmacist [facility] for Infectious Disease

## 2022-08-05 NOTE — Telephone Encounter (Signed)
Attempted to call to let [name] know that her oral omadacycline was approved and provide counseling. No answer, left HIPAA compliant voicemail with our callback number.    Blane Ohara, PharmD  PGY1 Pharmacy Resident

## 2022-08-08 ENCOUNTER — Telehealth: Payer: Self-pay

## 2022-08-08 NOTE — Telephone Encounter (Signed)
[facility] called our office stating that they received the omadacycline prescription and received an allergy alert flag because the patient has a documented allergy to tigecycline. Assured the pharmacist at [facility] that the allergy is known by our clinic and that we expect the patient to tolerate the oral omadacycline better than the IV tigecycline.   Blane Ohara, PharmD  PGY1 Pharmacy Resident

## 2022-08-24 ENCOUNTER — Other Ambulatory Visit: Payer: Self-pay | Admitting: Hematology

## 2022-08-24 ENCOUNTER — Other Ambulatory Visit: Payer: Self-pay | Admitting: Internal Medicine

## 2022-08-24 ENCOUNTER — Ambulatory Visit (HOSPITAL_COMMUNITY)
Admission: RE | Admit: 2022-08-24 | Discharge: 2022-08-24 | Disposition: A | Discharge status: Home / Self Care | Payer: Commercial Managed Care - PPO | Source: Ambulatory Visit | Attending: Internal Medicine | Admitting: Internal Medicine

## 2022-08-24 DIAGNOSIS — A318 Other mycobacterial infections: Secondary | ICD-10-CM | POA: Insufficient documentation

## 2022-08-26 ENCOUNTER — Encounter: Payer: Self-pay | Admitting: Hematology

## 2022-08-30 ENCOUNTER — Other Ambulatory Visit: Payer: Self-pay

## 2022-08-30 DIAGNOSIS — C187 Malignant neoplasm of sigmoid colon: Secondary | ICD-10-CM

## 2022-09-14 ENCOUNTER — Encounter: Payer: Self-pay | Admitting: Internal Medicine

## 2022-09-14 ENCOUNTER — Other Ambulatory Visit: Payer: Self-pay

## 2022-09-14 ENCOUNTER — Ambulatory Visit (INDEPENDENT_AMBULATORY_CARE_PROVIDER_SITE_OTHER): Payer: Commercial Managed Care - PPO | Admitting: Internal Medicine

## 2022-09-14 VITALS — BP 146/88 | HR 84 | Resp 16 | Ht 63.0 in | Wt 146.0 lb

## 2022-09-14 DIAGNOSIS — Z96641 Presence of right artificial hip joint: Secondary | ICD-10-CM | POA: Diagnosis not present

## 2022-09-14 DIAGNOSIS — A318 Other mycobacterial infections: Secondary | ICD-10-CM

## 2022-09-14 DIAGNOSIS — J181 Lobar pneumonia, unspecified organism: Secondary | ICD-10-CM | POA: Diagnosis not present

## 2022-09-14 NOTE — Progress Notes (Signed)
[facility]  Cc - m-abscessus lung    Patient Active Problem List   Diagnosis Date Noted   S/P total hip arthroplasty 07/12/2022   Cavitary lesion of lung 08/08/2021   S/P laparoscopic-assisted sigmoidectomy 11/22/2019   Genetic testing 10/30/2019   Family history of breast cancer    Cancer of sigmoid colon (HCC) 09/26/2019   Iron deficiency anemia due to chronic blood loss 09/26/2019   Symptomatic anemia 09/21/2019   GI bleed 09/20/2019   Malignant neoplasm of lower-outer quadrant of right breast of female, estrogen receptor positive (HCC) 10/27/2017    Cc -- reason for consult = ntm cavitary lung disease  HPI: Sonia Sandoval is a 72 y.o. 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 Was supposed to be seen several months prior but didn't show.    08/31/21 underwent bronch: "The distinct navigation pathways prepared prior to this procedure were then utilized to navigate to patient's lesion identified on CT scan. The robotic catheter was secured into place and the vision probe was withdrawn.  Lesion location was approximated using fluoroscopy and radial endobronchial ultrasound for peripheral targeting. Under fluoroscopic guidance transbronchial needle brushings, transbronchial needle biopsies, and transbronchial forceps biopsies were performed to be sent for cytology and pathology. A bronchioalveolar lavage was performed in the right upper lobe and sent for microbiology.  Additional transbronchial biopsies  sent for tissue culture.   At the end of the procedure a general airway inspection was performed and there was no evidence of active bleeding. The bronchoscope was removed.  The patient tolerated the procedure well. There was no significant blood loss and there were no obvious complications. A post-procedural chest x-ray is pending."  Cytology didn't see cancer cells  On micro -- something is growing that looks like ntm and the MALDI technology calls it abscessus but it was sent on 5/8th to reference lab to identify   Patient without respiratory sx outside of stable mild dyspnea on exertion 4-5 months. She would encounter this walking up a hill No b sx Has watery eyes and a recent dry cough but query if it's allergy No reflux No decreased appetite No weight loss   11/04/21 id clinic f/u Patient is back for f/u post susceptibility testing which show azithromycin/amikacin sensitive m abscessus She continues to feel well without b sx, cough, dyspnea, chest pain She is accompanied by her significant others today  12/28/21 id clinic f/u Patient developed itchy rash about 10 days ago involving trunk/ext. Takes benadryl and topical hydrocortisone. 2 benadryls to help sleep. Also takes claritin. Rash is getting worse Opat labs elevated lft but improving (moderate in 100s), without eosinophilia Amikacin trough 8/24 is 0.8 with creatinine 0.98 No fever chill Occasional cough Went to ed 8/08 for sob after extreme exertion and chest ct was done -- no pe. Improved cavitation.  No joint pain/muscle aching No new vision change; she endorses having cataracts Has chronic leg cramps which she has been using tonic water for 6 months. Question of some tingling in hands/feet  very mild  At some point she has recurrent elevation ak trough and cr and we had planned to substitute amikacin with linezolid but she done well as above with AK....  01/21/22 id clinic f/u Cefoxitin was hold first week of 12/2021 Her  rash is much better She does at one point had yeast vaginal infection and thrush about 2 weeks ago (she got fluconazole for vaginal yeast dose and some other topical gel otc for her mouth). She still feel some taste change on her tongue/feels raw as well with hot temperature especially No visual changes No numbness/tingling hands feet Current regimen: Amikacin Azith Clofazimine Linezolid (Doxy) Waiting for omadocycline approval again from insurance No n/v/diarrhea Rare cough Stable short of breath  She takes b6 daily for a while now  She had submitted 3 afb sputum but said the sputum are mostly upper oral. She can't get sputum up   02/08/22 id clinic f/u Patient last took abx a week ago. She was on tigycline at that time and had severe intolerable nausea/vomiting The doxycycline is out of the question is it should never be used to m-abscessus  She is ready to start again  03/16/22 id clinic f/u Patient started on amikacin, omado, clofaz, and azithromycin within a few days of 02/08/22 visit She is doing fine on those without any side effect Cough minimal/stable No b sx No weight loss -- weight gain actually No dyspnea Will have body ct to f/u breast cancer this December Can't get sputum up    06/16/22 id clinic f/u Had bronch mid jan and afb cx negative Chest ct 04/2022 showed stable cavitary changes sign but had become confluent No f/c/nightsweat, cough Not able to get up any sputum Reviewed opat labs. Normal creatinine and crp No n/v/diarrhea No rash  Skin is more pigmented and she asked why    09/14/22 id clinic f/u Had elective right hip arthroplasty 07/12/22. Not able to fully bear weight yet but improving. The incision had not fully closed -- there is one area about 1 cm along the incision inferiorly that is "gold" color on bandage. Size smaller. No pus, fever, chill. She is following up with [facility]; 2 weeks prior to this visit there was a probe into the  wound opening and testing was sent but she is not sure what was tested. No new abx She is tolerating clofaz/omado/clarithro No n/v/diarrhea No rash No cough She has seasonal allergy spring and fall bad 3 weeks prior developed pressure sensation in her head and had muffled hearing since. No facial pain. No purulent discharge from nose.  No teeth pain. She is being referred to ent Intermittent dry cough with her allergy   Review of Systems: ROS All other ros negative      Past Medical History:  Diagnosis Date   Allergies 1995   Per patient report 11/14/18   Anemia    prior to finding colon cancer, no longer any issues with this per patient   Anxiety    Uses for sleep and long car rides   Arthritis    Breast cancer [facility])    Breast cancer, right breast (HCC) 09/2017   S/P mastectomy 12/05/2017   Colon cancer (HCC)    COPD (chronic obstructive pulmonary disease) (HCC)    pt reports previous MD diagnosed her with this, but takes no medications   Dyspnea    occasionally with exertion   Family history of breast cancer    Headache 1968   per  patient report, started as a teenager   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   Mycobacterium infection    lung   Osteopenia 03/2018   Seen on DEXA Scan   PONV (postoperative nausea and vomiting)     Social History   Tobacco Use   Smoking status: Never   Smokeless tobacco: Never  Vaping Use   Vaping Use: Never used  Substance Use Topics   Alcohol use: Yes    Alcohol/week: 7.0 standard drinks of alcohol    Types: 7 Glasses of wine per week    Comment: 2-3 3oz glasses of wine per day   Drug use: Never    Family History  Problem Relation Age of Onset   Dementia Mother    Lung cancer Father        smoked   Emphysema Father    Heart disease Maternal Grandfather    Cancer Maternal Aunt 51       breast cancer     Cancer Cousin        maternal first cousin with breast cancer in her 31s    Allergies  Allergen Reactions   Tigecycline Diarrhea and Nausea And Vomiting   Codeine Nausea And Vomiting and Other (See Comments)    headache   Heparin Rash    Pt states she tolerates heparin flush for PICC but gets a full body rash with drip    OBJECTIVE: Vitals:   09/14/22 1507 09/14/22 1512  BP: (!) 152/88 (!) 146/88  Pulse: 84   Resp: 16   SpO2: 98%   Weight: 146 lb (66.2 kg)   Height: 5\' 3"  (1.6 m)    Body mass index is 25.86 kg/m.   Physical Exam General/constitutional: no distress, pleasant HEENT: Normocephalic, PER, Conj Clear, EOMI, Oropharynx clear -- ear canals with minimal dry ear wax but normal appearing TM lining without evidence of fluid behind tm Neck supple CV: rrr no mrg Lungs: clear to auscultation, normal respiratory effort Abd: Soft, Nontender Ext: no edema Skin: No Rash Neuro: nonfocal MSK: no peripheral joint swelling/tenderness/warmth; back spines nontender         Lab: Lab Results  Component Value Date   WBC 3.4 (L) 07/15/2022   HGB 7.0 (L) 07/15/2022   HCT 21.6 (L) 07/15/2022   MCV 91.5 07/15/2022   PLT 176 07/15/2022   Last metabolic panel Lab Results  Component Value Date   GLUCOSE 119 (H) 07/05/2022   NA 141 07/12/2022   K 4.1 07/12/2022   CL 109 07/05/2022   CO2 22 07/05/2022   BUN 14 07/05/2022   CREATININE 0.94 07/05/2022   GFRNONAA >60 07/05/2022   CALCIUM 8.6 (L) 07/05/2022   PHOS 3.6 11/14/2018   PROT 6.2 (L) 04/13/2022   ALBUMIN 4.1 04/13/2022   BILITOT 0.4 04/13/2022   ALKPHOS 53 04/13/2022   AST 23 04/13/2022   ALT 18 04/13/2022   ANIONGAP 6 07/05/2022    Microbiology:  Serology:  Imaging: I have reviewed the imaging personally and incorporated into decision making  08/06/21 chest abd pelv ct with contrast FINDINGS: CT CHEST FINDINGS   Cardiovascular: Aortic atherosclerosis. Normal heart size. No pericardial  effusion.   Mediastinum/Nodes: No enlarged mediastinal, hilar, or axillary lymph nodes. Thyroid gland, trachea, and esophagus demonstrate no significant findings.   Lungs/Pleura: Interval increase in wall thickening and cavitation within the right pulmonary apex, overall  lesion now confluent and measuring 3.8 x 3.1 cm (series 4, image 24). Increased clustered centrilobular nodularity and small consolidations in this vicinity (series 4, image 37). Residual bandlike scarring in the bilateral lung bases (series 4, image 109, 122). Occasional small nodules of the left lower lobe measuring up to 0.5 cm (series 4, image 21). No pleural effusion or pneumothorax.   Musculoskeletal: No chest wall mass or suspicious osseous lesions identified. Status post bilateral mastectomy.   CT ABDOMEN PELVIS FINDINGS   Hepatobiliary: No solid liver abnormality is seen. No gallstones, gallbladder wall thickening, or biliary dilatation.   Pancreas: Unremarkable. No pancreatic ductal dilatation or surrounding inflammatory changes.   Spleen: Normal in size without significant abnormality.   Adrenals/Urinary Tract: Adrenal glands are unremarkable. Punctuate nonobstructive calculus in the inferior pole of the right kidney (series 5, image 89). No left-sided calculi, ureteral calculi, or hydronephrosis. Bladder is unremarkable.   Stomach/Bowel: Stomach is within normal limits. Appendix is not clearly visualized and may be surgically absent. The cecum is anterior to the liver. Status post sigmoid colon resection and reanastomosis. No evidence of bowel wall thickening, distention, or inflammatory changes.   Vascular/Lymphatic: Aortic atherosclerosis. No enlarged abdominal or pelvic lymph nodes.   Reproductive: No mass or other abnormality.   Other: No abdominal wall hernia or abnormality. No ascites.   Musculoskeletal: No acute osseous findings.   IMPRESSION: 1. Status post sigmoid colon resection  and reanastomosis. No evidence of lymphadenopathy or metastatic disease in the chest, abdomen, or pelvis. 2. Interval increase in wall thickening and cavitation within the right pulmonary apex, overall lesion now confluent and measuring 3.8 x 3.1 cm. Increased clustered centrilobular nodularity and small consolidations in this vicinity. Findings are consistent with ongoing, worsened cavitary infection. 3. Additional small nodules of the left lower lobe measuring up to 0.5 cm, unchanged and almost certainly benign, incidental sequelae of infection inflammation. 4. Nonobstructive right nephrolithiasis.   12/07/21  chest cta 1. No pulmonary embolism. No acute intrathoracic pathology identified. 2. Slight interval decrease in size of a cavitary lesion within the right upper lobe in keeping with at least partial response to therapy. Repeat imaging in 6-12 months would be helpful in confirming stability or resolution exclude the possibility of an underlying cavitating malignancy 3. Stable 5 mm subpleural pulmonary nodule within the left lower lobe, safely considered benign.   Assessment/plan: Problem List Items Addressed This Visit   None Abx: 10/10 - c omadocycline, clofaz, azith  02/08/22-06/16/22 amikacin (9/7-10/03 doxy) 9/6-10/03 linezolid 7/6-10/03 amikacin 7/6-10/03 azith 7/6-10/03 clofazimine  7/6-9/07 cefoxitin    Discussed natural history, epidimiology, pathogenesis, diagnosis criteria and treatment indication for ntm lungs  Discussed resistant nature of abscessus, cure rate, relapse rate  At this time she is completely assymptomatic but imaging has indicated progression of cavitary lesion. On cytology there was no malignant cells. She had hx of colon cancer and breast cancer at least 3 years prior to this admission, and all are in remission  At this time culture is still pending final ID confirmation and susceptibility testing  I would treat her based on  progression of ct scan  Will await susceptibility and follow up with her in around 4-6 weeks to decide on treatment regimen    7/6 assessment Doing well Due to cavitary progression will do 2 iv's and 2 po's  Initial regimen will be iv amikacin, cefoxitin and po clofazimine and azithromycin  The initial regimen will be at least 1-3 months as long as she can  tolerate.  After the 3 months potentially we can keep 1 iv and the 2 PO's or transition her iv's to inhaled amikacin with the 2 po's  Our pharmacist team speaks with her today. Will start clofaz and azith today  Once picc is in she can start the cefoxitin continuous daily infusion and tiw amikacin   Repeat chest ct in 6-8 weeks Repeat afb sputum x3 1 month after starting medication   8/29 assessment Ct improved on 8/8 Rash ?cefoxitin. No sign of DRESS or arthus reaction otherwise. Will look in to the newer tetracycline along with bedaquilline for her. Can continue as is for now her current drug regimen 3 sputum early morning ordered for afb testing -- discussed to do it before eating/drinking/brushing/gargling Repeat chest ct in 3-6 months   Discussed with our id pharmacy team   ----------- 01/21/22 id assessment For some reason insurance is making it difficult in getting omadocycline approved. They want her to try doxycycline first  There is no evidence doxycycline works for [employer], and omadocycline or tigecycline would need to be the back bone of treatment (tigecycline of course much more adverse side effect).  Our pharmacist is trying to get this approved with insurance. Twice now, awaiting result  She is on amikacin, azith, clofazimine, linezolid now along with doxy (as requested by the insurance).  Cefoxitin stopped due to rash thought to be related to it.  She is tolerating it  Follow up with me in 4-6 weeks  I will ask dr Tonia Brooms about repeating a bronch in 3-6 months to get sputum again as she  can't get good sputum by herself. I am not confident I can trust her expectorated sputum for sterility check  Will plan on repeat chest ct in another 3 months and see if dr Tonia Brooms could help get some sputum in 3-6 months  02/08/22 id assessment Patient ready to start abx again. Couldn't tolerate tigecycline Iv omadocycline is covered by her insurance. They had several times unreasonably and illegally denied oral omadocycline  Will resume amikacin and omadocycline iv, with oral azithromycin and clofazimine  Plan 3 months iv then transition to oral hopefully by that time her sputum has sterilization of m-abscessus (so around 05/2022)  03/16/22 id assessment Patient to arrange body ct with her oncologist this December She'll be at 3 months of current regimen for m-abscessus by 05/2022 which I think she should have sputum assessment again I'll ask dr Tonia Brooms to do bronchoscopy to get afb sputum culture at that time (around end of December or early 05/2022 F/u with me after 4-6 weeks after bronchoscopy done so we can decide if we need to drop one of the iv antibiotics   06/16/22 id assessment Doing well Ct chest would say fairly stable 05/2022 bronch sample afb culture negative She can't get sputum so will have to repeat bronch in 08/2022 and need at least a year of negative sample before we can stop all antibiotics  For now we can stop amikacin Will continue iv omado, oral clofaz and azithromycin  Will check with our pharmacy team to see if transitioning from iv to PO omadocycline an option (insurance issue). And will also see if inhaled amikacin liposomal an option  Will let dr icard know to bronch er in 08/2022 Chest ct in 4-6 weeks  See me 4 weeks after bronchoscopy   09/14/22 id assessment We are about due for repeat sputum afb culture which she'll need bronchoscopy due to inability to get  expectorated sputum. She'll follow up with dr Tonia Brooms to get this done by June 2024 Right hip  arthroplasty 3/15 with still not fully healed incision -- not sure if there is concern for hardware take issue or infection; I asked patient to get record and any recent swab testing done of the wound Continue current 3 antibiotics clofaz/azith/omadocycline Routine blood test for abx monitoring F/u with me 2-4 weeks after egd is done  Sign of eustachian tube dysfunction pending ent evaluation. Will send chart to our pharmacy team too to inquire about abx side effect but I do not think that's the case   Chart to be sent to dr Icard asking for bronchoscopy/afb cx by 10/2022   ----------- Addendum 09/15/22 received records from ortho 4/24 swab cx of the wound opening on the right hip --> no organisms seen; rare wbc; cx negative 4/24 sed rate 11; crp < 3 (<8)   Follow-up: Return in about 3 months (around 12/15/2022).  Raymondo Band, MD [facility] 09/14/2022, 3:19 PM

## 2022-09-14 NOTE — Addendum Note (Signed)
Addended byRutha Bouchard T on: 09/14/2022 03:46 PM   Modules accepted: Orders

## 2022-09-14 NOTE — Patient Instructions (Signed)
Please get recent progress note and any lab testing from ortho clinic regarding your hip   I'll forward chart to dr Tonia Brooms to get bronchospy and sputum culture by 10/2022  See me 3-4 weeks after that is done  Labs today

## 2022-09-15 ENCOUNTER — Telehealth: Payer: Self-pay

## 2022-09-15 LAB — COMPLETE METABOLIC PANEL WITH GFR
AG Ratio: 2 (calc) (ref 1.0–2.5)
ALT: 30 U/L — ABNORMAL HIGH (ref 6–29)
AST: 31 U/L (ref 10–35)
Albumin: 4 g/dL (ref 3.6–5.1)
Alkaline phosphatase (APISO): 75 U/L (ref 37–153)
BUN: 21 mg/dL (ref 7–25)
CO2: 24 mmol/L (ref 20–32)
Calcium: 9.1 mg/dL (ref 8.6–10.4)
Chloride: 108 mmol/L (ref 98–110)
Creat: 0.75 mg/dL (ref 0.60–1.00)
Globulin: 2 g/dL (calc) (ref 1.9–3.7)
Glucose, Bld: 90 mg/dL (ref 65–99)
Potassium: 4.2 mmol/L (ref 3.5–5.3)
Sodium: 141 mmol/L (ref 135–146)
Total Bilirubin: 0.3 mg/dL (ref 0.2–1.2)
Total Protein: 6 g/dL — ABNORMAL LOW (ref 6.1–8.1)
eGFR: 86 mL/min/{1.73_m2} (ref >=60)

## 2022-09-15 LAB — CBC
HCT: 28.1 % — ABNORMAL LOW (ref 35.0–45.0)
Hemoglobin: 8.5 g/dL — ABNORMAL LOW (ref 11.7–15.5)
MCH: 24.8 pg — ABNORMAL LOW (ref 27.0–33.0)
MCHC: 30.2 g/dL — ABNORMAL LOW (ref 32.0–36.0)
MCV: 81.9 fL (ref 80.0–100.0)
MPV: 10.1 fL (ref 7.5–12.5)
Platelets: 265 10*3/uL (ref 140–400)
RBC: 3.43 10*6/uL — ABNORMAL LOW (ref 3.80–5.10)
RDW: 14.8 % (ref 11.0–15.0)
WBC: 3.4 10*3/uL — ABNORMAL LOW (ref 3.8–10.8)

## 2022-09-15 NOTE — Telephone Encounter (Signed)
Received records of labs from Orthopedics.

## 2022-09-19 ENCOUNTER — Telehealth: Payer: Self-pay

## 2022-09-19 NOTE — Telephone Encounter (Signed)
Nope.

## 2022-09-19 NOTE — Telephone Encounter (Signed)
[place] [facility] [phone]) patient's physical therapist called asking if patient has any limitations or is it activities for PT as tolerated  since her CT of the chest.  Please advise

## 2022-09-19 NOTE — Telephone Encounter (Signed)
LVM for Sonia Sandoval advising her no limitations for the patient. Alferd Obryant T Pricilla Loveless

## 2022-09-26 ENCOUNTER — Encounter: Payer: Self-pay | Admitting: Internal Medicine

## 2022-09-27 ENCOUNTER — Telehealth: Payer: Self-pay | Admitting: Pharmacist

## 2022-09-27 NOTE — Telephone Encounter (Signed)
Patient picked up 2 bottles of clofazimine on 09/27/22. Supply should last for ~3 months. Next refill due around the end of August/beginning of September.  Mickenzie Stolar L. Mellisa Arshad, PharmD, BCIDP, AAHIVP, CPP Clinical Pharmacist Practitioner Infectious Diseases Clinical Pharmacist [facility] 09/27/2022, 2:54 PM

## 2022-09-27 NOTE — Telephone Encounter (Signed)
Sonia Sandoval reached out to the [facility] for omadacycline, and she stated there are no reports demonstrating this with omadacycline ( they found one in the database about concerns but patient was on other medications as well). I did not realize she was experiencing actual hearing loss; thought was more of a vestibular type presentation from her recent visit. I hope the actual change in her hearing is because of this "pressure" she is describing and not because of azithro. Marchelle Folks

## 2022-09-28 NOTE — Telephone Encounter (Signed)
Physical therapist with [facility] called to inform Dr.Vu that patient has stopped taking Nuzyra yesterday because her daughter is getting married this week and she is afraid she is going to go deaf.  Patient told physical therapist she may start medication back next week but as of now she isn't taking it.

## 2022-09-29 ENCOUNTER — Other Ambulatory Visit (HOSPITAL_COMMUNITY): Payer: Self-pay

## 2022-09-29 ENCOUNTER — Telehealth: Payer: Self-pay | Admitting: Pharmacist

## 2022-09-29 DIAGNOSIS — A318 Other mycobacterial infections: Secondary | ICD-10-CM

## 2022-09-29 MED ORDER — LINEZOLID 600 MG PO TABS: 600.0000 mg | ORAL_TABLET | Freq: Every day | ORAL | 5 refills | Status: DC

## 2022-09-29 NOTE — Telephone Encounter (Signed)
Sounds good. Sonia Sandoval just talked to her and she is on board with the plan. Do you mind putting in a referral for ENT? She said her PCP was supposed to but she doesn't think they did.

## 2022-09-29 NOTE — Telephone Encounter (Signed)
I called the patient to discuss medication management for her mycobacterium lung infection. She was currently on azithromycin, omadacycline, and clofazimine when she started to experience hearing loss. The patient feels it was due to the omadacycline given the time she started it and when her hearing loss was first noticed. There have been no reports documented of hearing loss with omadacyclne unlike azithromycin where case reports have been documented showing hearing loss. I explained this to the patient and given her long duration on the azithromycin that it could potentially be the culprit. [name] understood and was accepting to stopping the azithromycin. She had recently stopped taking the omadacycline and has not taken the medication since Sunday 5/26. Her hearing loss had not gotten any better since stopping the medication.  With stopping the azithromycin, we added linezolid to her regimen. I counseled her take 1 tablet (600 mg) once daily with or without food. I went over common side effects such as headache and nausea/diarrhea and to take with food if she experiences nausea. She knows the importance of following up for lab work after a few weeks of treatment and stated she will get lab work at the cancer center on June 10 and will not need an appointment at the clinic. Her medication was sent to [facility] per patient request. She is aware to reach out prior to her lab work if she needs anything or wants to follow up in person with Cassie.  She does not have an appointment scheduled yet with the ENT office to discuss her hearing loss. She agreed to have Dr. Renold Don send in a referral to get her an appointment.  Thanks,  Arabella Merles, PharmD. [facility] PGY-1 09/29/2022 12:07 PM

## 2022-09-29 NOTE — Telephone Encounter (Signed)
I told her there has never been a report of hearing loss with omada. If any medication is causing it, it is probably the azithromycin. We've seen that several times lately with NTM and long courses. Ugh *sighhhh*

## 2022-10-04 ENCOUNTER — Other Ambulatory Visit: Payer: Self-pay

## 2022-10-04 DIAGNOSIS — H9193 Unspecified hearing loss, bilateral: Secondary | ICD-10-CM

## 2022-10-04 NOTE — Telephone Encounter (Signed)
REFERRAL PLACED FOR THE PATIENT TO SEE  DR. Ezzard Standing. PATIENT GIVEN THE OFFICE NUMBER TO CALL

## 2022-10-07 ENCOUNTER — Other Ambulatory Visit: Payer: Self-pay

## 2022-10-07 DIAGNOSIS — Z170 Estrogen receptor positive status [ER+]: Secondary | ICD-10-CM

## 2022-10-07 DIAGNOSIS — C187 Malignant neoplasm of sigmoid colon: Secondary | ICD-10-CM

## 2022-10-10 ENCOUNTER — Telehealth: Payer: Self-pay

## 2022-10-10 ENCOUNTER — Inpatient Hospital Stay: Payer: Commercial Managed Care - PPO

## 2022-10-10 ENCOUNTER — Ambulatory Visit (HOSPITAL_COMMUNITY): Payer: Commercial Managed Care - PPO

## 2022-10-10 NOTE — Telephone Encounter (Signed)
Sonia Sandoval called, she started the linezolid and has not been feeling well since. She's been fatigued and nauseous for the last week. She had to cancel her lab appointment and CT that was scheduled for today. She is wondering if she can go on an antibiotic holiday again. Will route to provider.   Sandie Ano, RN

## 2022-10-11 ENCOUNTER — Other Ambulatory Visit (HOSPITAL_COMMUNITY): Payer: Self-pay

## 2022-10-11 NOTE — Telephone Encounter (Signed)
Spoke with Sonia Sandoval, discussed that Dr. Renold Don was okay with her holding the linezolid and just taking the nuzyra and clofazamine as long as she follows up with [place] soon after her ENT appointment. She sees ENT on 7/23, she is scheduled to see Dr. Renold Don on 7/25.  She now states that she hasn't been taking any of her three medications due to sores on the corners of her mouth and reports that the "lower parts of me are scarlet." She says she needs a break from the treatment as she feels "drained."   Sandie Ano, RN

## 2022-10-11 NOTE — Telephone Encounter (Signed)
We told her to stop the azithromycin and not the nuzyra. So, she should be taking nuzyra, clofazimine, and linezolid. Whether she is doing that or not, I am not sure haha.

## 2022-10-13 ENCOUNTER — Telehealth: Payer: Commercial Managed Care - PPO | Admitting: Hematology

## 2022-10-18 ENCOUNTER — Ambulatory Visit: Payer: Commercial Managed Care - PPO | Admitting: Pulmonary Disease

## 2022-10-21 ENCOUNTER — Other Ambulatory Visit: Payer: Self-pay

## 2022-10-21 ENCOUNTER — Inpatient Hospital Stay: Payer: Commercial Managed Care - PPO | Attending: Hematology

## 2022-10-21 ENCOUNTER — Other Ambulatory Visit: Payer: Self-pay | Admitting: Hematology

## 2022-10-21 ENCOUNTER — Ambulatory Visit (HOSPITAL_COMMUNITY)
Admission: RE | Admit: 2022-10-21 | Discharge: 2022-10-21 | Disposition: A | Discharge status: Home / Self Care | Payer: Commercial Managed Care - PPO | Source: Ambulatory Visit | Attending: Hematology | Admitting: Hematology

## 2022-10-21 DIAGNOSIS — Z170 Estrogen receptor positive status [ER+]: Secondary | ICD-10-CM | POA: Insufficient documentation

## 2022-10-21 DIAGNOSIS — C187 Malignant neoplasm of sigmoid colon: Secondary | ICD-10-CM

## 2022-10-21 DIAGNOSIS — C50511 Malignant neoplasm of lower-outer quadrant of right female breast: Secondary | ICD-10-CM | POA: Diagnosis present

## 2022-10-21 DIAGNOSIS — Z79899 Other long term (current) drug therapy: Secondary | ICD-10-CM | POA: Diagnosis not present

## 2022-10-21 DIAGNOSIS — N200 Calculus of kidney: Secondary | ICD-10-CM | POA: Insufficient documentation

## 2022-10-21 LAB — CMP (CANCER CENTER ONLY)
ALT: 38 U/L (ref 0–44)
AST: 29 U/L (ref 15–41)
Albumin: 3.6 g/dL (ref 3.5–5.0)
Alkaline Phosphatase: 84 U/L (ref 38–126)
Anion gap: 5 (ref 5–15)
BUN: 14 mg/dL (ref 8–23)
CO2: 25 mmol/L (ref 22–32)
Calcium: 8.8 mg/dL — ABNORMAL LOW (ref 8.9–10.3)
Chloride: 111 mmol/L (ref 98–111)
Creatinine: 0.86 mg/dL (ref 0.44–1.00)
GFR, Estimated: >60 mL/min (ref >60)
Glucose, Bld: 110 mg/dL — ABNORMAL HIGH (ref 70–99)
Potassium: 4.1 mmol/L (ref 3.5–5.1)
Sodium: 141 mmol/L (ref 135–145)
Total Bilirubin: 0.3 mg/dL (ref 0.3–1.2)
Total Protein: 5.5 g/dL — ABNORMAL LOW (ref 6.5–8.1)

## 2022-10-21 LAB — CBC WITH DIFFERENTIAL (CANCER CENTER ONLY)
Abs Immature Granulocytes: 0 10*3/uL (ref 0.00–0.07)
Basophils Absolute: 0 10*3/uL (ref 0.0–0.1)
Basophils Relative: 0 %
Eosinophils Absolute: 0.1 10*3/uL (ref 0.0–0.5)
Eosinophils Relative: 4 %
HCT: 25.3 % — ABNORMAL LOW (ref 36.0–46.0)
Hemoglobin: 8 g/dL — ABNORMAL LOW (ref 12.0–15.0)
Immature Granulocytes: 0 %
Lymphocytes Relative: 22 %
Lymphs Abs: 0.6 10*3/uL — ABNORMAL LOW (ref 0.7–4.0)
MCH: 25.3 pg — ABNORMAL LOW (ref 26.0–34.0)
MCHC: 31.6 g/dL (ref 30.0–36.0)
MCV: 80.1 fL (ref 80.0–100.0)
Monocytes Absolute: 0.3 10*3/uL (ref 0.1–1.0)
Monocytes Relative: 12 %
Neutro Abs: 1.7 10*3/uL (ref 1.7–7.7)
Neutrophils Relative %: 62 %
Platelet Count: 249 10*3/uL (ref 150–400)
RBC: 3.16 MIL/uL — ABNORMAL LOW (ref 3.87–5.11)
RDW: 19.7 % — ABNORMAL HIGH (ref 11.5–15.5)
WBC Count: 2.8 10*3/uL — ABNORMAL LOW (ref 4.0–10.5)
nRBC: 0 % (ref 0.0–0.2)

## 2022-10-21 MED ORDER — SODIUM CHLORIDE (PF) 0.9 % IJ SOLN
INTRAMUSCULAR | Status: AC
  Filled 2022-10-21: qty 50

## 2022-10-21 MED ORDER — IOHEXOL 300 MG/ML  SOLN: 100.0000 mL | Freq: Once | INTRAMUSCULAR | Status: DC | PRN

## 2022-10-21 MED ORDER — IOHEXOL 9 MG/ML PO SOLN
ORAL | Status: AC
  Filled 2022-10-21: qty 1000

## 2022-10-21 MED ORDER — IOHEXOL 9 MG/ML PO SOLN
500.0000 mL | ORAL | Status: AC
  Administered 2022-10-21 (×2): 500 mL via ORAL

## 2022-10-31 ENCOUNTER — Encounter: Payer: Self-pay | Admitting: Internal Medicine

## 2022-11-01 NOTE — Telephone Encounter (Signed)
Will wait on Dr. Renold Don to weigh in.

## 2022-11-03 NOTE — Assessment & Plan Note (Signed)
pT3N0M0, stage II -diagnosed in 08/2019 by colonoscopy for anemia. 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 Margins and lymph nodes negative. -Adjuvant chemotherapy was not recommended -GuardianReveal in 04/2020 was negative. Repeat not performed due to insurance. 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 I personally reviewed the scan and discussed the findings with pt -she is 3 years out from initial diagnosis, risk of recurrence is low now, I do not plan to repeat surveillance CTs. -Continue lab and follow-up for additional 2 years.

## 2022-11-03 NOTE — Assessment & Plan Note (Signed)
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 S/p B/l mastectomies by Dr Luisa Hart and adjuvant radiation by Dr Mitzi Hansen -She started on Anastrozole in 04/2018, switched to exemestane in 08/2020 due to concerns with joint pain. Ultimately discontinued 07/2021 due to worsening joint pain from osteoarthritis.  -We previously discussed switching to tamoxifen, patient declined due to concern for side effects.  Her arthralgia did not improve after she came off exemestane, so she restarted exemestane in 01/2022. She declined Tamoxifen

## 2022-11-03 NOTE — Assessment & Plan Note (Signed)
nodules initially seen on staging CT for colon cancer 08/2019, developed new RLL consolidation on CT 04/2020 concerning for inflammation. PET scan 07/01/20 showed hypermetabolism to the now 3.3 cm RLL mass. 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 She was referred to ID and started on IV and oral antibiotics -f/u with ID  -Present CT chest from April 2024 reviewed, which showed overall slight improvement.

## 2022-11-04 ENCOUNTER — Inpatient Hospital Stay: Payer: Commercial Managed Care - PPO | Attending: Hematology | Admitting: Hematology

## 2022-11-04 ENCOUNTER — Encounter: Payer: Self-pay | Admitting: Hematology

## 2022-11-04 DIAGNOSIS — K648 Other hemorrhoids: Secondary | ICD-10-CM | POA: Insufficient documentation

## 2022-11-04 DIAGNOSIS — N200 Calculus of kidney: Secondary | ICD-10-CM | POA: Insufficient documentation

## 2022-11-04 DIAGNOSIS — C7800 Secondary malignant neoplasm of unspecified lung: Secondary | ICD-10-CM | POA: Insufficient documentation

## 2022-11-04 DIAGNOSIS — M85851 Other specified disorders of bone density and structure, right thigh: Secondary | ICD-10-CM | POA: Insufficient documentation

## 2022-11-04 DIAGNOSIS — I2510 Atherosclerotic heart disease of native coronary artery without angina pectoris: Secondary | ICD-10-CM | POA: Insufficient documentation

## 2022-11-04 DIAGNOSIS — Z170 Estrogen receptor positive status [ER+]: Secondary | ICD-10-CM | POA: Insufficient documentation

## 2022-11-04 DIAGNOSIS — Z923 Personal history of irradiation: Secondary | ICD-10-CM | POA: Insufficient documentation

## 2022-11-04 DIAGNOSIS — D500 Iron deficiency anemia secondary to blood loss (chronic): Secondary | ICD-10-CM | POA: Insufficient documentation

## 2022-11-04 DIAGNOSIS — K449 Diaphragmatic hernia without obstruction or gangrene: Secondary | ICD-10-CM | POA: Insufficient documentation

## 2022-11-04 DIAGNOSIS — Z9013 Acquired absence of bilateral breasts and nipples: Secondary | ICD-10-CM | POA: Insufficient documentation

## 2022-11-04 DIAGNOSIS — R58 Hemorrhage, not elsewhere classified: Secondary | ICD-10-CM | POA: Insufficient documentation

## 2022-11-04 DIAGNOSIS — M1990 Unspecified osteoarthritis, unspecified site: Secondary | ICD-10-CM | POA: Insufficient documentation

## 2022-11-04 DIAGNOSIS — I700 Atherosclerosis of aorta: Secondary | ICD-10-CM | POA: Insufficient documentation

## 2022-11-04 DIAGNOSIS — Z79899 Other long term (current) drug therapy: Secondary | ICD-10-CM | POA: Insufficient documentation

## 2022-11-04 DIAGNOSIS — Z888 Allergy status to other drugs, medicaments and biological substances status: Secondary | ICD-10-CM | POA: Insufficient documentation

## 2022-11-04 DIAGNOSIS — D649 Anemia, unspecified: Secondary | ICD-10-CM

## 2022-11-04 DIAGNOSIS — Z79811 Long term (current) use of aromatase inhibitors: Secondary | ICD-10-CM | POA: Insufficient documentation

## 2022-11-04 DIAGNOSIS — C187 Malignant neoplasm of sigmoid colon: Secondary | ICD-10-CM | POA: Insufficient documentation

## 2022-11-04 DIAGNOSIS — J984 Other disorders of lung: Secondary | ICD-10-CM

## 2022-11-04 DIAGNOSIS — K644 Residual hemorrhoidal skin tags: Secondary | ICD-10-CM | POA: Insufficient documentation

## 2022-11-04 DIAGNOSIS — Z885 Allergy status to narcotic agent status: Secondary | ICD-10-CM | POA: Insufficient documentation

## 2022-11-04 DIAGNOSIS — C50511 Malignant neoplasm of lower-outer quadrant of right female breast: Secondary | ICD-10-CM | POA: Insufficient documentation

## 2022-11-04 NOTE — Progress Notes (Signed)
[facility]   Telephone:(336) [phone] Fax:(336) [phone]   Clinic Follow up Note   Patient Care Team: [name] Palau, FNP as PCP - General (Nurse Practitioner) Harriette Bouillon, MD as Consulting Physician (General Surgery) Magrinat, Valentino Hue, MD (Inactive) as Consulting Physician (Oncology) Dorothy Puffer, MD as Consulting Physician (Radiation Oncology) Albin Fischer, RN as Registered Nurse Farris Has, MD as Referring Physician (Dermatology) Radonna Ricker, RN (Inactive) as Oncology Nurse Navigator Malachy Mood, MD as Consulting Physician (Hematology) Andria Meuse, MD as Consulting Physician (General Surgery) Malachy Mood, MD as Consulting Physician (Hematology) Barnett Abu, MD as Consulting Physician (Neurosurgery)  Date of Service:  11/04/2022  I connected with Sonia Sandoval on 11/04/2022 at  8:20 AM EDT by telephone visit and verified that I am speaking with the correct person using two identifiers.  I discussed the limitations, risks, security and privacy concerns of performing an evaluation and management service by telephone and the availability of in person appointments. I also discussed with the patient that there may be a patient responsible charge related to this service. The patient expressed understanding and agreed to proceed.   Other persons participating in the visit and their role in the encounter:  No  Patient's location:  Home Provider's location:  [facility] Office  CHIEF COMPLAINT: f/u of colon cancer, h/o breast cancer     CURRENT THERAPY:  Surveillance  ASSESSMENT & PLAN:   Sonia Sandoval is a 72 y.o. female with     Cancer of sigmoid colon (HCC) pT3N0M0, stage II -diagnosed in 08/2019 by colonoscopy for anemia. 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 Margins and lymph nodes  negative. -Adjuvant chemotherapy was not recommended -GuardianReveal in 04/2020 was negative. Repeat not performed due to insurance. 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 I personally reviewed the scan and discussed the findings with pt -she is 3 years out from initial diagnosis, risk of recurrence is low now, I do not plan to repeat surveillance CTs. -Continue lab and follow-up for additional 2 years.  Malignant neoplasm of lower-outer quadrant of right breast of female, estrogen receptor positive (HCC) -Stage IIA,pT2N1M0, ER+/PR+/HER2-, G3 -diagnosed in 09/2017. S/p B/l mastectomies by Dr Luisa Hart and adjuvant radiation by Dr Mitzi Hansen -She started on Anastrozole in 04/2018, switched to exemestane in 08/2020 due to concerns with joint pain. Ultimately discontinued 07/2021 due to worsening joint pain from osteoarthritis.  -We previously discussed switching to tamoxifen, patient declined due to concern for side effects.  Her arthralgia did not improve after she came off exemestane, so she restarted exemestane in 01/2022. She declined Tamoxifen   Cavitary lesion of lung nodules initially seen on staging CT for colon cancer 08/2019, developed new RLL consolidation on CT 04/2020 concerning for inflammation. PET scan 07/01/20 showed hypermetabolism to the now 3.3 cm RLL mass. 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 She was referred to ID and started on IV and oral antibiotics -f/u with ID  -Present CT chest from April 2024 reviewed, which showed overall slight improvement.  Anemia -She developed a significant anemia after her hip surgery 3 months ago, required 1 unit of blood transfusion. -Her anemia may also be related to her long course antibiotics -I will obtain anemia workup, including iron study, B12, methylmalonic acid level, folate, reticular panel, and haptoglobin, next week   PLAN: -lab reviewed - CT scan reviewed with pt -  Monitor pt for Anemia -Hg<8 -blood transfusion  needed - lab and f/u with phone visit after lab next week   SUMMARY OF ONCOLOGIC HISTORY: Oncology History Overview Note  Cancer Staging Cancer of sigmoid colon [facility]) Staging form: Colon and Rectum, AJCC 8th Edition - Pathologic stage from 11/22/2019: Stage IIA (pT3, pN0, cM0) - Signed by Malachy Mood, MD on 12/17/2019  Malignant neoplasm of lower-outer quadrant of right breast of female, estrogen receptor positive (HCC) Staging form: Breast, AJCC 8th Edition - Clinical: Stage IIB (cT2, cN1, cM0, G3, ER+, PR+, HER2-) - Unsigned - Pathologic: Stage IIA (pT2, pN1(sn), cM0, G3, ER+, PR+, HER2-) - Unsigned    Malignant neoplasm of lower-outer quadrant of right breast of female, estrogen receptor positive (HCC)  10/23/2017 Initial Diagnosis    right breast lower outer quadrant biopsy 10/23/2017 for a clinical T2 N1, stage IIB invasive ductal carcinoma, grade 3, estrogen and progesterone receptor positive, HER-2 not amplified, with an MIB-1 of 20%   11/07/2017 Miscellaneous   Mammaprint on was read as low risk, predicting no significant chemotherapy benefit with a 5-year distant disease-free survival in the 97-98% range with hormone therapy alone   12/05/2017 Surgery   status post bilateral mastectomies on 12/05/2017 showing             (a) on the left, no evidence of malignancy             (b) on the right, a pT2 pN1, stage IIB invasive ductal carcinoma, grade 3, with negative margins. By Dr Luisa Hart   01/15/2018 - 02/28/2018 Radiation Therapy   adjuvant radiation 01/15/2018 - 02/28/2018 with Dr Mitzi Hansen              Site/dose: The patient initially received a dose of 50.4 Gy in 28 fractions to the chest wall and supraclavicular region. This was delivered using a 3-D conformal, 4 field technique. The patient then received a boost to the mastectomy scar. This delivered an additional 10 Gy in 5 fractions using an en face electron field. The total dose was 60.4 Gy.    04/01/2018 -  Anti-estrogen oral therapy    started anastrozole in 04/2018. Switched to Exemestane in 08/2020 due to joint pain.             (a) bone density 03/07/2018 with a T score of -1.8. Her 04/21/20 DEXA showed osteopenia -1.8 at right total femur, with low risk for fracture.   10/28/2019 Genetic Testing   Negative genetic testing.  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  The Common Hereditary Gene Panel offered by [facility] includes sequencing and/or deletion duplication testing of the following 48 genes: APC, ATM, AXIN2, BARD1, BMPR1A, BRCA1, BRCA2, BRIP1, CDH1, CDK4, CDKN2A (p14ARF), CDKN2A (p16INK4a), CHEK2, CTNNA1, DICER1, EPCAM (Deletion/duplication testing only), GREM1 (promoter region deletion/duplication testing only), KIT, MEN1, MLH1, MSH2, MSH3, MSH6, MUTYH, NBN, NF1, NHTL1, PALB2, PDGFRA, PMS2, POLD1, POLE, PTEN, RAD50, RAD51C, RAD51D, RNF43, SDHB, SDHC, SDHD, SMAD4, SMARCA4. STK11, TP53, TSC1, TSC2, and VHL.  The following genes were evaluated for sequence changes only: SDHA and HOXB13 c.251G>A variant only. The report date is October 28, 2019.   03/08/2021 Imaging   CT Chest w/o contrast  IMPRESSION: 1. The previous FDG avid lung mass within the anterior right lower lobe has decreased in size compared with previous exam compatible with response to therapy. The cavitary lung nodule within the right apex is slightly increased in size from previous exam. Additionally, there is a new irregular nodule  within the medial right upper lobe measuring 6 mm. Previously 3 mm. 2. Interval increase in size of cavitary nodule within the right lung apex. This measures 2.3 x 1.5 cm, compared with 1.3 x 0.9 cm previously. Underlying malignancy cannot be excluded. 3. New small nodule within the right upper lobe measures 6 mm. This has a nonspecific appearance but warrants attention on follow-up imaging. 4. The remaining small pulmonary nodules noted previously are stable in the interval. 5. Coronary artery  calcifications noted. 6. Aortic Atherosclerosis (ICD10-I70.0).   Cancer of sigmoid colon (HCC)  09/23/2019 Tumor Marker   Baseline CEA 1.4 on 09/23/19    09/23/2019 Procedure   Colonoscopy and Upper endoscopy with Dr Levora Angel 09/23/19  Upper Endoscopy Impression - Z-line regular, 35 cm from the incisors. - Small hiatal hernia. - Non-bleeding gastric ulcer. Biopsied. - Hematin (altered blood/coffee-ground-like material) in the gastric body. - Normal duodenal bulb, first portion of the duodenum and second portion of - Normal duodenal bulb, first portion of the duodenum and second portion of the duodenum.  Colonoscopy Impression - Preparation of the colon was poor. - Perianal skin tags found on perianal exam. - Likely malignant tumor in the recto-sigmoid colon. Biopsied. Tattooed. - Internal hemorrhoids.   09/23/2019 Initial Biopsy   FINAL MICROSCOPIC DIAGNOSIS: 09/23/19 A. STOMACH, ULCER, BIOPSY:  - Gastric antral mucosa with mild nonspecific reactive gastropathy  - Warthin Starry stain is negative for Helicobacter pylori   B. COLON, RECTOSIGMOID, MASS, BIOPSY:  - Adenocarcinoma, see comment  COMMENT:   B.  Immunohistochemical stains for MMR-related proteins are pending and  will be reported in an addendum. Dr. Luisa Hart reviewed the case and  concurs with the diagnosis. Dr. Levora Angel was notified on 09/24/2019.     ADDENDUM:  Mismatch Repair Protein (IHC)  SUMMARY INTERPRETATION: NORMAL  There is preserved expression of the major MMR proteins. There is a very  low probability that microsatellite instability (MSI) is present.  However, certain clinically significant MMR protein mutations may result  in preservation of nuclear expression. It is recommended that the  preservation of protein expression be correlated with molecular based  MSI testing.   IHC EXPRESSION RESULTS  TEST           RESULT  MLH1:          Preserved nuclear expression  MSH2:          Preserved nuclear  expression  MSH6:          Preserved nuclear expression  PMS2:          Preserved nuclear expression    09/23/2019 Imaging   CT CAP W Contrast 09/23/19  IMPRESSION: 1. There is eccentric wall thickening of the rectosigmoid colon, in keeping with mass identified and biopsied by colonoscopy.   2. There are prominent subcentimeter lymph nodes in the sigmoid mesocolon, suspicious for nodal metastatic disease. There is no other lymphadenopathy in the abdomen or pelvis.   3. There are multiple small bilateral pulmonary nodules measuring up to 4 mm, nonspecific although very suspicious for pulmonary metastatic disease. Given history of breast malignancy, previous imaging of the chest would be very helpful to assess for stability. This report may be addended for comparison if prior imaging can be obtained.   4. Status post bilateral mastectomy with irregular subpleural opacity of the anterior right upper lobe, consistent with history of breast malignancy and radiation fibrosis.   5. There is adjacent nodularity of the right pulmonary apex, the largest discrete nodular component  measuring up to 1.1 cm. This is more favored infectious or inflammatory although nonspecific and metastatic disease is not excluded. As as above, prior imaging would be very helpful to assess.   6. Small focus of nonspecific infectious or inflammatory airspace disease of the perihilar left upper lobe.   09/26/2019 Initial Diagnosis   Cancer of sigmoid colon (HCC)   10/28/2019 PET scan   IMPRESSION: 1. Increased tracer uptake within the pelvis corresponding to wall thickening of the rectosigmoid colon favored to represent primary colorectal carcinoma. No findings of FDG avid nodal metastasis or solid organ metastasis within the abdomen or pelvis. 2. Within the right lung apex there is a cluster of 3 subpleural nodular densities which exhibit varying degrees of mild FDG uptake with SUV max between 1.9 and 3.1.  Morphologically the appearance is suggestive of benign postinflammatory pleuroparenchymal scarring. This would be an atypical pattern of metastatic disease, which is considered less favored, but not excluded. Attention in this area on serial follow-up imaging is advised. 3. Two small, less than 5 mm left lower lobe lung nodules are too small to reliably characterize. 4.  Aortic Atherosclerosis (ICD10-I70.0). 5. Nonobstructing right renal calculus.     11/22/2019 Cancer Staging   Staging form: Colon and Rectum, AJCC 8th Edition - Pathologic stage from 11/22/2019: Stage IIA (pT3, pN0, cM0) - Signed by Malachy Mood, MD on 12/17/2019   11/22/2019 Surgery   XI ROBOT ASSISTED SIGMOIDECTOMY and FLEXIBLE SIGMOIDOSCOPY by Dr white and Dr Maisie Fus   11/22/2019 Pathology Results   FINAL MICROSCOPIC DIAGNOSIS:   A. SIGMOID COLON, RESECTION:  - Invasive moderately differentiated adenocarcinoma, 4.5 cm, involving distal sigmoid colon  - Carcinoma focally invades into pericolonic soft tissue  - Resection margins are negative for carcinoma  - Negative for lymphovascular or perineural invasion  - Eighteen benign lymph nodes (0/18)  - See oncology table   B. FINAL DISTAL MARGIN, EXCISION:  - Colonic donut, negative for carcinoma    04/15/2020 Imaging   CT Chest  IMPRESSION: 1. New nodular 1.9 cm focus of consolidation in the anterior right lower lobe with surrounding mild patchy tree-in-bud opacity, nonspecific but favoring infectious/inflammatory etiology. 2. Clustered nodularity in the apical right upper lobe is stable in size and newly cavitary. These findings are indeterminate for atypical infection such as MAI versus response to therapy of pulmonary metastases. Additional scattered tiny left pulmonary nodules are stable. Suggest close chest CT follow-up in 3 months. 3. No thoracic adenopathy. 4. Stable dilated main pulmonary artery, suggesting chronic pulmonary arterial hypertension. 5. Aortic  Atherosclerosis (ICD10-I70.0).   04/17/2020 Tumor Marker   Her Guardant Reveal showed negative - ctDNA not detected.    07/08/2020 Imaging   CT Chest  IMPRESSION: Slight reduction in size of hypermetabolic right upper lobe pulmonary nodule, nonspecific though favored to represent area of infection/inflammation as was initially suggested on chest CT performed 04/15/2020. No biopsy attempted at this time.   03/08/2021 Imaging   CT Chest w/o contrast  IMPRESSION: 1. The previous FDG avid lung mass within the anterior right lower lobe has decreased in size compared with previous exam compatible with response to therapy. The cavitary lung nodule within the right apex is slightly increased in size from previous exam. Additionally, there is a new irregular nodule within the medial right upper lobe measuring 6 mm. Previously 3 mm. 2. Interval increase in size of cavitary nodule within the right lung apex. This measures 2.3 x 1.5 cm, compared with 1.3 x 0.9 cm previously.  Underlying malignancy cannot be excluded. 3. New small nodule within the right upper lobe measures 6 mm. This has a nonspecific appearance but warrants attention on follow-up imaging. 4. The remaining small pulmonary nodules noted previously are stable in the interval. 5. Coronary artery calcifications noted. 6. Aortic Atherosclerosis (ICD10-I70.0).   04/04/2022 Imaging    IMPRESSION: 1. Cavitary right apical lesion has become confluent in the interval, measuring minimally smaller at 3.1 x 2.1 cm compared to 3.2 x 2.8 cm previously. 2. Stable left lower lobe pulmonary nodules measuring up to 6 mm. 3. Enlargement of the pulmonary outflow tract/main pulmonary arteries suggests pulmonary arterial hypertension. 4.  Aortic Atherosclerosis (ICD10-I70.0).   10/21/2022 Imaging    IMPRESSION: No evidence of recurrent or metastatic carcinoma within the abdomen or pelvis.   Tiny right renal calculus. No evidence of ureteral  calculi or hydronephrosis.   Large stool burden noted; recommend correlation for symptoms or signs of constipation.   Stable sub-cm left lower lobe pulmonary nodules. Recommend continued attention on follow-up imaging.        INTERVAL HISTORY:  Sonia Sandoval was contacted for a follow up of colon cancer, h/o breast cancer .She was last seen by me on 04/13/2022. Pt report that she is still on oral antibiotics. Pt sate that her CBC are low. Pt state that she feels very fatigue. Pt state that she had to use a walker temporarily., but she is not using it any more. Pt state that she had lab 6/21.   All other systems were reviewed with the patient and are negative.  MEDICAL HISTORY:  Past Medical History:  Diagnosis Date   Allergies 1995   Per patient report 11/14/18   Anemia    prior to finding colon cancer, no longer any issues with this per patient   Anxiety    Uses for sleep and long car rides   Arthritis    Breast cancer (HCC)    Breast cancer, right breast (HCC) 09/2017   S/P mastectomy 12/05/2017   Colon cancer (HCC)    COPD (chronic obstructive pulmonary disease) (HCC)    pt reports previous MD diagnosed her with this, but takes no medications   Dyspnea    occasionally with exertion   Family history of breast cancer    Headache 1968   per patient report, started as a teenager   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   Mycobacterium infection    lung   Osteopenia 03/2018   Seen on DEXA Scan   PONV (postoperative nausea and vomiting)     SURGICAL HISTORY: Past Surgical History:  Procedure Laterality Date   BACK SURGERY  2003   Patient will occassional back pain since 2003   BIOPSY  09/23/2019   Procedure: BIOPSY;  Surgeon: Kathi Der, MD;  Location: [facility];  Service: Gastroenterology;;  EGD and COLON   BREAST BIOPSY Right 09/2017   BRONCHIAL  BIOPSY  08/31/2021   Procedure: BRONCHIAL BIOPSIES;  Surgeon: Josephine Igo, DO;  Location: [facility];  Service: Pulmonary;;   BRONCHIAL BIOPSY  05/17/2022   Procedure: BRONCHIAL BIOPSIES;  Surgeon: Josephine Igo, DO;  Location: [facility];  Service: Pulmonary;;   BRONCHIAL BRUSHINGS  08/31/2021   Procedure: BRONCHIAL BRUSHINGS;  Surgeon: Josephine Igo, DO;  Location: [facility];  Service: Pulmonary;;   BRONCHIAL NEEDLE ASPIRATION BIOPSY  08/31/2021  Procedure: BRONCHIAL NEEDLE ASPIRATION BIOPSIES;  Surgeon: Josephine Igo, DO;  Location: [facility];  Service: Pulmonary;;   BRONCHIAL WASHINGS  08/31/2021   Procedure: BRONCHIAL WASHINGS;  Surgeon: Josephine Igo, DO;  Location: [facility];  Service: Pulmonary;;   BRONCHIAL WASHINGS  05/17/2022   Procedure: BRONCHIAL WASHINGS;  Surgeon: Josephine Igo, DO;  Location: [facility];  Service: Pulmonary;;   CESAREAN SECTION  1985; 1992   COLONOSCOPY WITH PROPOFOL N/A 09/23/2019   Procedure: COLONOSCOPY WITH PROPOFOL and EGD;  Surgeon: Kathi Der, MD;  Location: [facility];  Service: Gastroenterology;  Laterality: N/A;   DILATION AND CURETTAGE OF UTERUS     ESOPHAGOGASTRODUODENOSCOPY N/A 09/23/2019   Procedure: ESOPHAGOGASTRODUODENOSCOPY (EGD);  Surgeon: Kathi Der, MD;  Location: [facility];  Service: Gastroenterology;  Laterality: N/A;   FLEXIBLE SIGMOIDOSCOPY N/A 11/22/2019   Procedure: FLEXIBLE SIGMOIDOSCOPY;  Surgeon: Andria Meuse, MD;  Location: [facility];  Service: General;  Laterality: N/A;   LUMBAR DISC SURGERY  2003   MASTECTOMY Left 12/05/2017   PROPHYLACTIC MASTECTOMY   MASTECTOMY COMPLETE / SIMPLE W/ SENTINEL NODE BIOPSY Right 12/05/2017   WITH RADIOACTIVE SEED TARGETED RIGHT AXIILARY LYMPH NODE EXCISION AND RIGHT SENTINEL LYMPH NODE BIOPSY,    MASTECTOMY WITH RADIOACTIVE SEED GUIDED EXCISION AND AXILLARY SENTINEL LYMPH NODE BIOPSY Bilateral 12/05/2017   Procedure: RIGHT SIMPLE MASTECTOMY WITH  RADIOACTIVE SEED TARGETED RIGHT AXIILARY LYMPH NODE EXCISION AND RIGHT SENTINEL LYMPH NODE BIOPSY, LEFT PROPHYLACTIC MASTECTOMY;  Surgeon: Harriette Bouillon, MD;  Location: [facility];  Service: General;  Laterality: Bilateral;   SUBMUCOSAL TATTOO INJECTION  09/23/2019   Procedure: SUBMUCOSAL TATTOO INJECTION;  Surgeon: Kathi Der, MD;  Location: [facility];  Service: Gastroenterology;;   TOTAL HIP ARTHROPLASTY Right 07/12/2022   Procedure: RIGHT TOTAL HIP ARTHROPLASTY ANTERIOR APPROACH;  Surgeon: Ernestina Columbia, MD;  Location: [facility];  Service: Orthopedics;  Laterality: Right;   VIDEO BRONCHOSCOPY WITH RADIAL ENDOBRONCHIAL ULTRASOUND  08/31/2021   Procedure: RADIAL ENDOBRONCHIAL ULTRASOUND;  Surgeon: Josephine Igo, DO;  Location: [facility];  Service: Pulmonary;;    I have reviewed the social history and family history with the patient and they are unchanged from previous note.  ALLERGIES:  is allergic to tigecycline, codeine, and heparin.  MEDICATIONS:  Current Outpatient Medications  Medication Sig Dispense Refill   albuterol (VENTOLIN HFA) 108 (90 Base) MCG/ACT inhaler TAKE 2 PUFFS BY MOUTH EVERY 6 HOURS AS NEEDED FOR WHEEZE OR SHORTNESS OF BREATH 18 each 2   ALPRAZolam (NIRAVAM) 0.5 MG dissolvable tablet Take 0.5 mg by mouth daily as needed (Sleep/Travel).     azithromycin (ZITHROMAX) 500 MG tablet TAKE 1 TABLET (500 MG TOTAL) BY MOUTH DAILY. 90 tablet 3   Calcium Carbonate-Vitamin D (CALCIUM 600+D PO) Take 600 mg by mouth daily.     celecoxib (CELEBREX) 100 MG capsule Take 1 capsule (100 mg total) by mouth 2 (two) times daily. 60 capsule 2   Cholecalciferol (VITAMIN D) 50 MCG (2000 UT) tablet Take 2,000 Units by mouth daily.     CLOFAZIMINE PO Take 100 mg by mouth daily.     cyanocobalamin (VITAMIN B12) 1000 MCG tablet Take 1,000 mcg by mouth daily. Chew     exemestane (AROMASIN) 25 MG tablet TAKE 1 TABLET (25 MG TOTAL) BY MOUTH DAILY AFTER BREAKFAST. 90 tablet 1   gabapentin  (NEURONTIN) 300 MG capsule Take 300 mg by mouth 2 (two) times daily.     linezolid (ZYVOX) 600 MG tablet Take 1 tablet (600 mg total) by  mouth daily. 30 tablet 5   Omadacycline Tosylate 150 MG TABS Take 2 tablets (300 mg total) by mouth daily. Fast for 4 hours before taking and 2 hours after taking. 60 tablet 5   OVER THE COUNTER MEDICATION Take 1 tablet by mouth daily. Juice plus fruit blend     OVER THE COUNTER MEDICATION Take 1 capsule by mouth daily. Juice plus berry blend     OVER THE COUNTER MEDICATION Take 1 capsule by mouth daily. Juice plus veggie blend     Probiotic Product (PROBIOTIC PO) Take 2 capsules by mouth daily. 800 Million CFU     promethazine (PHENERGAN) 12.5 MG tablet Take 1 tablet (12.5 mg total) by mouth every 6 (six) hours as needed for nausea or vomiting. 30 tablet 0   vitamin E 180 MG (400 UNITS) capsule Take 400 Units by mouth daily.     No current facility-administered medications for this visit.    PHYSICAL EXAMINATION: ECOG PERFORMANCE STATUS: 1 - Symptomatic but completely ambulatory  There were no vitals filed for this visit. Wt Readings from Last 3 Encounters:  09/14/22 146 lb (66.2 kg)  07/12/22 145 lb (65.8 kg)  07/05/22 147 lb (66.7 kg)     No vitals taken today, Exam not performed today  LABORATORY DATA:  I have reviewed the data as listed    Latest Ref Rng & Units 10/21/2022   11:24 AM 09/14/2022    3:55 AM 07/15/2022    5:01 AM  CBC  WBC 4.0 - 10.5 K/uL 2.8  3.4  3.4   Hemoglobin 12.0 - 15.0 g/dL 8.0  8.5  7.0   Hematocrit 36.0 - 46.0 % 25.3  28.1  21.6   Platelets 150 - 400 K/uL 249  265  176         Latest Ref Rng & Units 10/21/2022   11:24 AM 09/14/2022    3:55 AM 07/12/2022    1:28 PM  CMP  Glucose 70 - 99 mg/dL 409  90    BUN 8 - 23 mg/dL 14  21    Creatinine 8.11 - 1.00 mg/dL 9.14  7.82    Sodium 956 - 145 mmol/L 141  141  141   Potassium 3.5 - 5.1 mmol/L 4.1  4.2  4.1   Chloride 98 - 111 mmol/L 111  108    CO2 22 - 32 mmol/L  25  24    Calcium 8.9 - 10.3 mg/dL 8.8  9.1    Total Protein 6.5 - 8.1 g/dL 5.5  6.0    Total Bilirubin 0.3 - 1.2 mg/dL 0.3  0.3    Alkaline Phos 38 - 126 U/L 84     AST 15 - 41 U/L 29  31    ALT 0 - 44 U/L 38  30        RADIOGRAPHIC STUDIES: I have personally reviewed the radiological images as listed and agreed with the findings in the report. No results found.    Orders Placed This Encounter  Procedures   Ferritin    Standing Status:   Standing    Number of Occurrences:   20    Standing Expiration Date:   11/04/2023   Retic Panel    Standing Status:   Future    Standing Expiration Date:   11/04/2023   TSH    Standing Status:   Future    Standing Expiration Date:   11/04/2023   Vitamin B12    Standing Status:  Future    Standing Expiration Date:   11/04/2023   Methylmalonic acid, serum    Standing Status:   Future    Standing Expiration Date:   11/04/2023   Folate RBC    Standing Status:   Future    Standing Expiration Date:   11/04/2023   Erythropoietin    Standing Status:   Future    Standing Expiration Date:   11/04/2023   Haptoglobin    Standing Status:   Future    Standing Expiration Date:   11/04/2023   Type and screen         Standing Status:   Future    Standing Expiration Date:   11/04/2023   All questions were answered. The patient knows to call the clinic with any problems, questions or concerns. No barriers to learning was detected. The total time spent in the appointment was 25 minutes.     Malachy Mood, MD 11/04/2022   Carolin Coy am acting as scribe for Malachy Mood, MD.   I have reviewed the above documentation for accuracy and completeness, and I agree with the above.

## 2022-11-07 ENCOUNTER — Telehealth: Payer: Self-pay | Admitting: Hematology

## 2022-11-17 ENCOUNTER — Ambulatory Visit (INDEPENDENT_AMBULATORY_CARE_PROVIDER_SITE_OTHER): Payer: Commercial Managed Care - PPO | Admitting: Pulmonary Disease

## 2022-11-17 ENCOUNTER — Encounter: Payer: Self-pay | Admitting: Pulmonary Disease

## 2022-11-17 VITALS — BP 120/80 | HR 71 | Ht 63.0 in | Wt 144.8 lb

## 2022-11-17 DIAGNOSIS — A310 Pulmonary mycobacterial infection: Secondary | ICD-10-CM | POA: Insufficient documentation

## 2022-11-17 DIAGNOSIS — R911 Solitary pulmonary nodule: Secondary | ICD-10-CM | POA: Insufficient documentation

## 2022-11-17 DIAGNOSIS — J984 Other disorders of lung: Secondary | ICD-10-CM | POA: Diagnosis not present

## 2022-11-17 NOTE — H&P (View-Only) (Signed)
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  Presents for evaluation of a right upper lobe cavitary lesion.  She denies cough sputum production.  No incarceration, no travel outside of the country.  No known exposures to TB.  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  She is here today for follow-up of this lung nodule.  She has a CT scan scheduled for Friday.  She was going to see me last fall but there was plans for changing of her insurance and so she pushed off the appointment until the following year.  And so she is here today to talk about this she does have a follow-up appointment with medical oncology next week.  OV 12/29/2021: Here today for follow-up.Last seen in the office by Jairo Ben, NP.  Seen by Dr. Renold Don from infectious disease.  Office note reviewed from yesterday.  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  Follow-up CT imaging was canceled because she had a CT completed in the emergency room on 12/07/2021.  This follow-up CT revealed slight interval decrease in the size of the cavity within the right upper lobe.  Doing well today.  Unfortunately had a recent drug reaction with skin manifestations.  Was stopped yesterday by one of her medicines.  She has follow-up with Dr. Renold Don relating this.  She had PFTs completed prior to today's office visit which we reviewed today.  She does have a ratio of 63 with an FEV1 of 67% predicted prebronchodilator and 68% change with postbronchodilator, TLC of 126%, RV of 141%, DLCO 125%.   She has history of secondhand smoke exposure from her parents and a history of allergies as a kid.  From a respiratory standpoint she occasionally gets short of breath with significant exertion but otherwise no other symptomatology.  OV 11/17/2022: Here today for follow-up.  Initial cultures for her cavitary lesion came back with Mycobacterium abscessus she has been on a prolonged antibiotic attic course with infectious disease.  Here today for setting up of a repeat bronchoscopy for cultures that she is unable to produce significant amount of sputum to analyze.    Oncology History Overview Note  Cancer Staging Cancer of sigmoid colon [facility]) Staging form: Colon and Rectum, AJCC 8th Edition - Pathologic stage from 11/22/2019: Stage IIA (pT3, pN0, cM0) - Signed by Malachy Mood, MD on 12/17/2019  Malignant neoplasm of lower-outer quadrant of right breast of female, estrogen receptor positive (HCC) Staging form: Breast, AJCC 8th Edition - Clinical: Stage IIB (cT2, cN1, cM0, G3, ER+, PR+, HER2-) - Unsigned - Pathologic: Stage IIA (pT2, pN1(sn), cM0, G3, ER+, PR+, HER2-) - Unsigned    Malignant neoplasm of lower-outer quadrant of right breast of female, estrogen receptor positive (HCC)  10/23/2017 Initial Diagnosis    right breast lower outer quadrant biopsy 10/23/2017 for a clinical T2 N1, stage IIB invasive ductal carcinoma, grade 3, estrogen and progesterone receptor positive, HER-2 not amplified, with an MIB-1 of 20%   11/07/2017 Miscellaneous   Mammaprint on was read  as low risk, predicting no significant chemotherapy benefit with a 5-year distant disease-free survival in the 97-98% range with hormone therapy alone   12/05/2017 Surgery   status post bilateral mastectomies on 12/05/2017 showing             (a) on the left, no evidence of malignancy             (b) on the right, a pT2 pN1, stage IIB invasive ductal carcinoma, grade 3, with negative margins. By Dr Luisa Hart   01/15/2018 - 02/28/2018  Radiation Therapy   adjuvant radiation 01/15/2018 - 02/28/2018 with Dr Mitzi Hansen              Site/dose: The patient initially received a dose of 50.4 Gy in 28 fractions to the chest wall and supraclavicular region. This was delivered using a 3-D conformal, 4 field technique. The patient then received a boost to the mastectomy scar. This delivered an additional 10 Gy in 5 fractions using an en face electron field. The total dose was 60.4 Gy.    04/01/2018 -  Anti-estrogen oral therapy   started anastrozole in 04/2018. Switched to Exemestane in 08/2020 due to joint pain.             (a) bone density 03/07/2018 with a T score of -1.8. Her 04/21/20 DEXA showed osteopenia -1.8 at right total femur, with low risk for fracture.   10/28/2019 Genetic Testing   Negative genetic testing.  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  The Common Hereditary Gene Panel offered by [facility] includes sequencing and/or deletion duplication testing of the following 48 genes: APC, ATM, AXIN2, BARD1, BMPR1A, BRCA1, BRCA2, BRIP1, CDH1, CDK4, CDKN2A (p14ARF), CDKN2A (p16INK4a), CHEK2, CTNNA1, DICER1, EPCAM (Deletion/duplication testing only), GREM1 (promoter region deletion/duplication testing only), KIT, MEN1, MLH1, MSH2, MSH3, MSH6, MUTYH, NBN, NF1, NHTL1, PALB2, PDGFRA, PMS2, POLD1, POLE, PTEN, RAD50, RAD51C, RAD51D, RNF43, SDHB, SDHC, SDHD, SMAD4, SMARCA4. STK11, TP53, TSC1, TSC2, and VHL.  The following genes were evaluated for sequence changes only: SDHA and HOXB13 c.251G>A variant only. The report date is October 28, 2019.   03/08/2021 Imaging   CT Chest w/o contrast  IMPRESSION: 1. The previous FDG avid lung mass within the anterior right lower lobe has decreased in size compared with previous exam compatible with response to therapy. The cavitary lung nodule within the right apex is slightly increased in size from previous exam. Additionally, there is a new irregular nodule within the medial right  upper lobe measuring 6 mm. Previously 3 mm. 2. Interval increase in size of cavitary nodule within the right lung apex. This measures 2.3 x 1.5 cm, compared with 1.3 x 0.9 cm previously. Underlying malignancy cannot be excluded. 3. New small nodule within the right upper lobe measures 6 mm. This has a nonspecific appearance but warrants attention on follow-up imaging. 4. The remaining small pulmonary nodules noted previously are stable in the interval. 5. Coronary artery calcifications noted. 6. Aortic Atherosclerosis (ICD10-I70.0).   Cancer of sigmoid colon (HCC)  09/23/2019 Tumor Marker   Baseline CEA 1.4 on 09/23/19    09/23/2019 Procedure   Colonoscopy and Upper endoscopy with Dr Levora Angel 09/23/19  Upper Endoscopy Impression - Z-line regular, 35 cm from the incisors. - Small hiatal hernia. - Non-bleeding gastric ulcer. Biopsied. - Hematin (altered blood/coffee-ground-like material) in the gastric body. - Normal duodenal bulb, first portion of the duodenum and second portion of - Normal duodenal bulb, first portion of the duodenum and second portion  of the duodenum.  Colonoscopy Impression - Preparation of the colon was poor. - Perianal skin tags found on perianal exam. - Likely malignant tumor in the recto-sigmoid colon. Biopsied. Tattooed. - Internal hemorrhoids.   09/23/2019 Initial Biopsy   FINAL MICROSCOPIC DIAGNOSIS: 09/23/19 A. STOMACH, ULCER, BIOPSY:  - Gastric antral mucosa with mild nonspecific reactive gastropathy  - Warthin Starry stain is negative for Helicobacter pylori   B. COLON, RECTOSIGMOID, MASS, BIOPSY:  - Adenocarcinoma, see comment  COMMENT:   B.  Immunohistochemical stains for MMR-related proteins are pending and  will be reported in an addendum. Dr. Luisa Hart reviewed the case and  concurs with the diagnosis. Dr. Levora Angel was notified on 09/24/2019.     ADDENDUM:  Mismatch Repair Protein (IHC)  SUMMARY INTERPRETATION: NORMAL  There is preserved  expression of the major MMR proteins. There is a very  low probability that microsatellite instability (MSI) is present.  However, certain clinically significant MMR protein mutations may result  in preservation of nuclear expression. It is recommended that the  preservation of protein expression be correlated with molecular based  MSI testing.   IHC EXPRESSION RESULTS  TEST           RESULT  MLH1:          Preserved nuclear expression  MSH2:          Preserved nuclear expression  MSH6:          Preserved nuclear expression  PMS2:          Preserved nuclear expression    09/23/2019 Imaging   CT CAP W Contrast 09/23/19  IMPRESSION: 1. There is eccentric wall thickening of the rectosigmoid colon, in keeping with mass identified and biopsied by colonoscopy.   2. There are prominent subcentimeter lymph nodes in the sigmoid mesocolon, suspicious for nodal metastatic disease. There is no other lymphadenopathy in the abdomen or pelvis.   3. There are multiple small bilateral pulmonary nodules measuring up to 4 mm, nonspecific although very suspicious for pulmonary metastatic disease. Given history of breast malignancy, previous imaging of the chest would be very helpful to assess for stability. This report may be addended for comparison if prior imaging can be obtained.   4. Status post bilateral mastectomy with irregular subpleural opacity of the anterior right upper lobe, consistent with history of breast malignancy and radiation fibrosis.   5. There is adjacent nodularity of the right pulmonary apex, the largest discrete nodular component measuring up to 1.1 cm. This is more favored infectious or inflammatory although nonspecific and metastatic disease is not excluded. As as above, prior imaging would be very helpful to assess.   6. Small focus of nonspecific infectious or inflammatory airspace disease of the perihilar left upper lobe.   09/26/2019 Initial Diagnosis   Cancer of  sigmoid colon (HCC)   10/28/2019 PET scan   IMPRESSION: 1. Increased tracer uptake within the pelvis corresponding to wall thickening of the rectosigmoid colon favored to represent primary colorectal carcinoma. No findings of FDG avid nodal metastasis or solid organ metastasis within the abdomen or pelvis. 2. Within the right lung apex there is a cluster of 3 subpleural nodular densities which exhibit varying degrees of mild FDG uptake with SUV max between 1.9 and 3.1. Morphologically the appearance is suggestive of benign postinflammatory pleuroparenchymal scarring. This would be an atypical pattern of metastatic disease, which is considered less favored, but not excluded. Attention in this area on serial follow-up imaging is advised. 3. Two small,  less than 5 mm left lower lobe lung nodules are too small to reliably characterize. 4.  Aortic Atherosclerosis (ICD10-I70.0). 5. Nonobstructing right renal calculus.     11/22/2019 Cancer Staging   Staging form: Colon and Rectum, AJCC 8th Edition - Pathologic stage from 11/22/2019: Stage IIA (pT3, pN0, cM0) - Signed by Malachy Mood, MD on 12/17/2019   11/22/2019 Surgery   XI ROBOT ASSISTED SIGMOIDECTOMY and FLEXIBLE SIGMOIDOSCOPY by Dr white and Dr Maisie Fus   11/22/2019 Pathology Results   FINAL MICROSCOPIC DIAGNOSIS:   A. SIGMOID COLON, RESECTION:  - Invasive moderately differentiated adenocarcinoma, 4.5 cm, involving distal sigmoid colon  - Carcinoma focally invades into pericolonic soft tissue  - Resection margins are negative for carcinoma  - Negative for lymphovascular or perineural invasion  - Eighteen benign lymph nodes (0/18)  - See oncology table   B. FINAL DISTAL MARGIN, EXCISION:  - Colonic donut, negative for carcinoma    04/15/2020 Imaging   CT Chest  IMPRESSION: 1. New nodular 1.9 cm focus of consolidation in the anterior right lower lobe with surrounding mild patchy tree-in-bud opacity, nonspecific but favoring  infectious/inflammatory etiology. 2. Clustered nodularity in the apical right upper lobe is stable in size and newly cavitary. These findings are indeterminate for atypical infection such as MAI versus response to therapy of pulmonary metastases. Additional scattered tiny left pulmonary nodules are stable. Suggest close chest CT follow-up in 3 months. 3. No thoracic adenopathy. 4. Stable dilated main pulmonary artery, suggesting chronic pulmonary arterial hypertension. 5. Aortic Atherosclerosis (ICD10-I70.0).   04/17/2020 Tumor Marker   Her Guardant Reveal showed negative - ctDNA not detected.    07/08/2020 Imaging   CT Chest  IMPRESSION: Slight reduction in size of hypermetabolic right upper lobe pulmonary nodule, nonspecific though favored to represent area of infection/inflammation as was initially suggested on chest CT performed 04/15/2020. No biopsy attempted at this time.   03/08/2021 Imaging   CT Chest w/o contrast  IMPRESSION: 1. The previous FDG avid lung mass within the anterior right lower lobe has decreased in size compared with previous exam compatible with response to therapy. The cavitary lung nodule within the right apex is slightly increased in size from previous exam. Additionally, there is a new irregular nodule within the medial right upper lobe measuring 6 mm. Previously 3 mm. 2. Interval increase in size of cavitary nodule within the right lung apex. This measures 2.3 x 1.5 cm, compared with 1.3 x 0.9 cm previously. Underlying malignancy cannot be excluded. 3. New small nodule within the right upper lobe measures 6 mm. This has a nonspecific appearance but warrants attention on follow-up imaging. 4. The remaining small pulmonary nodules noted previously are stable in the interval. 5. Coronary artery calcifications noted. 6. Aortic Atherosclerosis (ICD10-I70.0).   04/04/2022 Imaging    IMPRESSION: 1. Cavitary right apical lesion has become confluent in  the interval, measuring minimally smaller at 3.1 x 2.1 cm compared to 3.2 x 2.8 cm previously. 2. Stable left lower lobe pulmonary nodules measuring up to 6 mm. 3. Enlargement of the pulmonary outflow tract/main pulmonary arteries suggests pulmonary arterial hypertension. 4.  Aortic Atherosclerosis (ICD10-I70.0).   10/21/2022 Imaging    IMPRESSION: No evidence of recurrent or metastatic carcinoma within the abdomen or pelvis.   Tiny right renal calculus. No evidence of ureteral calculi or hydronephrosis.   Large stool burden noted; recommend correlation for symptoms or signs of constipation.   Stable sub-cm left lower lobe pulmonary nodules. Recommend continued attention on follow-up imaging.  Past Medical History:  Diagnosis Date   Allergies 1995   Per patient report 11/14/18   Anemia    prior to finding colon cancer, no longer any issues with this per patient   Anxiety    Uses for sleep and long car rides   Arthritis    Breast cancer 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   Mycobacterium infection    lung   Osteopenia 03/2018   Seen on DEXA Scan   PONV (postoperative nausea and vomiting)      Family History  Problem Relation Age of Onset   Dementia Mother    Lung cancer Father        smoked   Emphysema Father    Heart disease Maternal Grandfather    Cancer Maternal Aunt 60       breast cancer    Cancer Cousin        maternal first cousin with breast cancer in her 46s     Past  Surgical History:  Procedure Laterality Date   BACK SURGERY  2003   Patient will occassional back pain since 2003   BIOPSY  09/23/2019   Procedure: BIOPSY;  Surgeon: Kathi Der, MD;  Location: [facility];  Service: Gastroenterology;;  EGD and COLON   BREAST BIOPSY Right 09/2017   BRONCHIAL BIOPSY  08/31/2021   Procedure: BRONCHIAL BIOPSIES;  Surgeon: Josephine Igo, DO;  Location: [facility];  Service: Pulmonary;;   BRONCHIAL BIOPSY  05/17/2022   Procedure: BRONCHIAL BIOPSIES;  Surgeon: Josephine Igo, DO;  Location: [facility];  Service: Pulmonary;;   BRONCHIAL BRUSHINGS  08/31/2021   Procedure: BRONCHIAL BRUSHINGS;  Surgeon: Josephine Igo, DO;  Location: [facility];  Service: Pulmonary;;   BRONCHIAL NEEDLE ASPIRATION BIOPSY  08/31/2021   Procedure: BRONCHIAL NEEDLE ASPIRATION BIOPSIES;  Surgeon: Josephine Igo, DO;  Location: [facility];  Service: Pulmonary;;   BRONCHIAL WASHINGS  08/31/2021   Procedure: BRONCHIAL WASHINGS;  Surgeon: Josephine Igo, DO;  [facility]: [facility];  Service: Pulmonary;;   BRONCHIAL WASHINGS  05/17/2022   Procedure: BRONCHIAL WASHINGS;  Surgeon: Josephine Igo, DO;  Location: [facility];  Service: Pulmonary;;   CESAREAN SECTION  1985; 1992   COLONOSCOPY WITH PROPOFOL N/A 09/23/2019   Procedure: COLONOSCOPY WITH PROPOFOL and EGD;  Surgeon: Kathi Der, MD;  Location: [facility];  Service: Gastroenterology;  Laterality: N/A;   DILATION AND CURETTAGE OF UTERUS     ESOPHAGOGASTRODUODENOSCOPY N/A 09/23/2019   Procedure: ESOPHAGOGASTRODUODENOSCOPY (EGD);  Surgeon: Kathi Der, MD;  Location: [facility];  Service: Gastroenterology;  Laterality: N/A;   FLEXIBLE SIGMOIDOSCOPY N/A 11/22/2019   Procedure: FLEXIBLE SIGMOIDOSCOPY;  Surgeon: Andria Meuse, MD;  Location: [facility];  Service: General;  Laterality: N/A;   LUMBAR DISC SURGERY  2003   MASTECTOMY Left 12/05/2017   PROPHYLACTIC MASTECTOMY   MASTECTOMY COMPLETE / SIMPLE W/  SENTINEL NODE BIOPSY Right 12/05/2017   WITH RADIOACTIVE SEED TARGETED  RIGHT AXIILARY LYMPH NODE EXCISION AND RIGHT SENTINEL LYMPH NODE BIOPSY,    MASTECTOMY WITH RADIOACTIVE SEED GUIDED EXCISION AND AXILLARY SENTINEL LYMPH NODE BIOPSY Bilateral 12/05/2017   Procedure: RIGHT SIMPLE MASTECTOMY WITH RADIOACTIVE SEED TARGETED RIGHT AXIILARY LYMPH NODE EXCISION AND RIGHT SENTINEL LYMPH NODE BIOPSY, LEFT PROPHYLACTIC MASTECTOMY;  Surgeon: Harriette Bouillon, MD;  Location: [facility];  Service: General;  Laterality: Bilateral;   SUBMUCOSAL TATTOO INJECTION  09/23/2019   Procedure: SUBMUCOSAL TATTOO INJECTION;  Surgeon: Kathi Der, MD;  Location: [facility];  Service: Gastroenterology;;   TOTAL HIP ARTHROPLASTY Right 07/12/2022   Procedure: RIGHT TOTAL HIP ARTHROPLASTY ANTERIOR APPROACH;  Surgeon: Ernestina Columbia, MD;  Location: [facility];  Service: Orthopedics;  Laterality: Right;   VIDEO BRONCHOSCOPY WITH RADIAL ENDOBRONCHIAL ULTRASOUND  08/31/2021   Procedure: RADIAL ENDOBRONCHIAL ULTRASOUND;  Surgeon: Josephine Igo, DO;  [facility];  Service: Pulmonary;;    Social History   Socioeconomic History   Marital status: Married    Spouse name: Not on file   Number of children: 4   Years of education: Not on file   Highest education level: Not on file  Occupational History   Not on file  Tobacco Use   Smoking status: Never   Smokeless tobacco: Never  Vaping Use   Vaping status: Never Used  Substance and Sexual Activity   Alcohol use: Yes    Alcohol/week: 7.0 standard drinks of alcohol    Types: 7 Glasses of wine per week    Comment: 2-3 3oz glasses of wine per day   Drug use: Never   Sexual activity: Not on file  Other Topics Concern   Not on file  Social History Narrative   Not on file   Social Determinants of Health   Financial Resource Strain: Low Risk  (08/23/2022)   Received from [facility], [facility]   Overall Financial Resource Strain (CARDIA)    Difficulty of  Paying Living Expenses: Not hard at all  Food Insecurity: No Food Insecurity (08/23/2022)   Received from [facility], [facility]   Hunger Vital Sign    Worried About Running Out of Food in the Last Year: Never true    Ran Out of Food in the Last Year: Never true  Transportation Needs: No Transportation Needs (08/23/2022)   Received from [facility], [facility]   PRAPARE - Transportation    Lack of Transportation (Medical): No    Lack of Transportation (Non-Medical): No  Physical Activity: Insufficiently Active (08/23/2022)   Received from [facility], [facility]   Exercise Vital Sign    Days of Exercise per Week: 4 days    Minutes of Exercise per Session: 10 min  Stress: Stress Concern Present (08/23/2022)   Received from [facility], [facility] - Occupational Stress Questionnaire    Feeling of Stress : To some extent  Social Connections: Socially Integrated (08/23/2022)   Received from [facility], [facility]   Social Network    How would you rate your social network (family, work, friends)?: Good participation with social networks  Intimate Partner Violence: Not At Risk (08/23/2022)   Received from [facility], [facility]   HITS    Over the last 12 months how often did your partner physically hurt you?: 1    Over the last 12 months how often did your partner insult you or talk down to you?: 1    Over the last 12 months how often  did your partner threaten you with physical harm?: 1    Over the last 12 months how often did your partner scream or curse at you?: 1     Allergies  Allergen Reactions   Tigecycline Diarrhea and Nausea And Vomiting   Codeine Nausea And Vomiting and Other (See Comments)    headache   Heparin Rash    Pt states she tolerates heparin flush for PICC but gets a full body rash with drip     Outpatient Medications Prior to Visit  Medication Sig Dispense Refill   albuterol (VENTOLIN HFA)  108 (90 Base) MCG/ACT inhaler TAKE 2 PUFFS BY MOUTH EVERY 6 HOURS AS NEEDED FOR WHEEZE OR SHORTNESS OF BREATH 18 each 2   ALPRAZolam (NIRAVAM) 0.5 MG dissolvable tablet Take 0.5 mg by mouth daily as needed (Sleep/Travel).     Calcium Carbonate-Vitamin D (CALCIUM 600+D PO) Take 600 mg by mouth daily.     celecoxib (CELEBREX) 100 MG capsule Take 1 capsule (100 mg total) by mouth 2 (two) times daily. 60 capsule 2   Cholecalciferol (VITAMIN D) 50 MCG (2000 UT) tablet Take 2,000 Units by mouth daily.     CLOFAZIMINE PO Take 100 mg by mouth daily.     cyanocobalamin (VITAMIN B12) 1000 MCG tablet Take 1,000 mcg by mouth daily. Chew     exemestane (AROMASIN) 25 MG tablet TAKE 1 TABLET (25 MG TOTAL) BY MOUTH DAILY AFTER BREAKFAST. 90 tablet 1   gabapentin (NEURONTIN) 300 MG capsule Take 300 mg by mouth 2 (two) times daily.     Omadacycline Tosylate 150 MG TABS Take 2 tablets (300 mg total) by mouth daily. Fast for 4 hours before taking and 2 hours after taking. 60 tablet 5   OVER THE COUNTER MEDICATION Take 1 tablet by mouth daily. Juice plus fruit blend     OVER THE COUNTER MEDICATION Take 1 capsule by mouth daily. Juice plus berry blend     OVER THE COUNTER MEDICATION Take 1 capsule by mouth daily. Juice plus veggie blend     Probiotic Product (PROBIOTIC PO) Take 2 capsules by mouth daily. 800 Million CFU     vitamin E 180 MG (400 UNITS) capsule Take 400 Units by mouth daily.     azithromycin (ZITHROMAX) 500 MG tablet TAKE 1 TABLET (500 MG TOTAL) BY MOUTH DAILY. 90 tablet 3   linezolid (ZYVOX) 600 MG tablet Take 1 tablet (600 mg total) by mouth daily. 30 tablet 5   promethazine (PHENERGAN) 12.5 MG tablet Take 1 tablet (12.5 mg total) by mouth every 6 (six) hours as needed for nausea or vomiting. 30 tablet 0   No facility-administered medications prior to visit.    Review of Systems  Constitutional:  Negative for chills, fever, malaise/fatigue and weight loss.  HENT:  Positive for hearing loss.  Negative for sore throat and tinnitus.   Eyes:  Negative for blurred vision and double vision.  Respiratory:  Negative for cough, hemoptysis, sputum production, shortness of breath, wheezing and stridor.   Cardiovascular:  Negative for chest pain, palpitations, orthopnea, leg swelling and PND.  Gastrointestinal:  Negative for abdominal pain, constipation, diarrhea, heartburn, nausea and vomiting.  Genitourinary:  Negative for dysuria, hematuria and urgency.  Musculoskeletal:  Negative for joint pain and myalgias.  Skin:  Negative for itching and rash.  Neurological:  Negative for dizziness, tingling, weakness and headaches.  Endo/Heme/Allergies:  Negative for environmental allergies. Does not bruise/bleed easily.  Psychiatric/Behavioral:  Negative for depression. The patient is  not nervous/anxious and does not have insomnia.   All other systems reviewed and are negative.    Objective:  Physical Exam Vitals reviewed.  Constitutional:      General: She is not in acute distress.    Appearance: She is well-developed.  HENT:     Head: Normocephalic and atraumatic.  Eyes:     General: No scleral icterus.    Conjunctiva/sclera: Conjunctivae normal.     Pupils: Pupils are equal, round, and reactive to light.  Neck:     Vascular: No JVD.     Trachea: No tracheal deviation.  Cardiovascular:     Rate and Rhythm: Normal rate and regular rhythm.     Heart sounds: Normal heart sounds. No murmur heard. Pulmonary:     Effort: Pulmonary effort is normal. No tachypnea, accessory muscle usage or respiratory distress.     Breath sounds: Normal breath sounds. No stridor. No wheezing, rhonchi or rales.  Abdominal:     General: Bowel sounds are normal. There is no distension.     Palpations: Abdomen is soft.     Tenderness: There is no abdominal tenderness.  Musculoskeletal:        General: No tenderness.     Cervical back: Neck supple.  Lymphadenopathy:     Cervical: No cervical adenopathy.   Skin:    General: Skin is warm and dry.     Capillary Refill: Capillary refill takes less than 2 seconds.     Findings: No rash.  Neurological:     Mental Status: She is alert and oriented to person, place, and time.  Psychiatric:        Behavior: Behavior normal.      Vitals:   11/17/22 0913  BP: 120/80  Pulse: 71  SpO2: 98%  Weight: 144 lb 12.8 oz (65.7 kg)  Height: 5\' 3"  (1.6 m)    98% on RA BMI Readings from Last 3 Encounters:  11/17/22 25.65 kg/m  09/14/22 25.86 kg/m  07/12/22 25.69 kg/m   Wt Readings from Last 3 Encounters:  11/17/22 144 lb 12.8 oz (65.7 kg)  09/14/22 146 lb (66.2 kg)  07/12/22 145 lb (65.8 kg)     CBC    Component Value Date/Time   WBC 2.8 (L) 10/21/2022 1124   WBC 3.4 (L) 09/14/2022 0355   RBC 3.16 (L) 10/21/2022 1124   HGB 8.0 (L) 10/21/2022 1124   HCT 25.3 (L) 10/21/2022 1124   PLT 249 10/21/2022 1124   MCV 80.1 10/21/2022 1124   MCH 25.3 (L) 10/21/2022 1124   MCHC 31.6 10/21/2022 1124   RDW 19.7 (H) 10/21/2022 1124   LYMPHSABS 0.6 (L) 10/21/2022 1124   MONOABS 0.3 10/21/2022 1124   EOSABS 0.1 10/21/2022 1124   BASOSABS 0.0 10/21/2022 1124    Chest Imaging: CT chest November 2022: Right upper lobe cavitary lesion and smaller pulmonary nodules bilaterally.  Increased size of the nodule in the right lung. The patient's images have been independently reviewed by me.    April 2024: Lung nodule in the right upper lobe more solid now, less cavitary. CT imaging reviewed independently by me today in the office.   Pulmonary Functions Testing Results:    Latest Ref Rng & Units 12/29/2021   10:50 AM  PFT Results  FVC-Pre L 3.07   FVC-Predicted Pre % 105   FVC-Post L 3.30   FVC-Predicted Post % 113   Pre FEV1/FVC % % 48   Post FEV1/FCV % % 76   FEV1-Pre  L 1.48   FEV1-Predicted Pre % 67   FEV1-Post L 2.50   DLCO uncorrected ml/min/mmHg 23.71   DLCO UNC% % 125   DLCO corrected ml/min/mmHg 24.58   DLCO COR %Predicted %  130   DLVA Predicted % 118   TLC L 6.18   TLC % Predicted % 126   RV % Predicted % 141     FeNO: none  Pathology: none  Echocardiogram: none  Heart Catheterization: none    Assessment & Plan:     ICD-10-CM   1. Lung nodule  R91.1 Procedural/ Surgical Case Request: ROBOTIC ASSISTED NAVIGATIONAL BRONCHOSCOPY    Ambulatory referral to Pulmonology    2. Cavitary lesion of lung  J98.4 Procedural/ Surgical Case Request: ROBOTIC ASSISTED NAVIGATIONAL BRONCHOSCOPY    Ambulatory referral to Pulmonology    3. Mycobacterial disease, pulmonary (HCC)  A31.0 Procedural/ Surgical Case Request: ROBOTIC ASSISTED NAVIGATIONAL BRONCHOSCOPY    Ambulatory referral to Pulmonology       Discussion:  This is a 72 year old female, evaluation of right upper lobe cavitary lesion that was taken for bronchoscopy and diagnosed with Mycobacterium abscessus.  She has a history of breast cancer and colon cancer a lifelong non-smoker.  She does have secondhand smoke exposure and she had PFTs with no evidence of obstruction but does have some reversibility with evidence of air trapping and mildly elevated DLCO.  Recent cultures from sputum have been negative and she is very difficult to produce sputum infectious disease would like to have repeat bronchoscopy with cultures.  Plan: She was stopped off her azithromycin due to recent changes in hearing. She is still on therapy for her Mycobacterium abscessus including some new oral antibiotic regimens. I have communicated with infectious disease and we will plan with a repeat bronchoscopy. Tentative bronchoscopy date will be on December 05, 2022. We appreciate help [facility] scheduling.  She does not need to follow-up with [place] immediately after bronchoscopy but would schedule follow-up 4 weeks after bronchoscopy with Dr. Bartholomew Crews and infectious disease to be able to review culture results and make decisions regarding ongoing antimicrobial therapy.    Current Outpatient  Medications:    albuterol (VENTOLIN HFA) 108 (90 Base) MCG/ACT inhaler, TAKE 2 PUFFS BY MOUTH EVERY 6 HOURS AS NEEDED FOR WHEEZE OR SHORTNESS OF BREATH, Disp: 18 each, Rfl: 2   ALPRAZolam (NIRAVAM) 0.5 MG dissolvable tablet, Take 0.5 mg by mouth daily as needed (Sleep/Travel)., Disp: , Rfl:    Calcium Carbonate-Vitamin D (CALCIUM 600+D PO), Take 600 mg by mouth daily., Disp: , Rfl:    celecoxib (CELEBREX) 100 MG capsule, Take 1 capsule (100 mg total) by mouth 2 (two) times daily., Disp: 60 capsule, Rfl: 2   Cholecalciferol (VITAMIN D) 50 MCG (2000 UT) tablet, Take 2,000 Units by mouth daily., Disp: , Rfl:    CLOFAZIMINE PO, Take 100 mg by mouth daily., Disp: , Rfl:    cyanocobalamin (VITAMIN B12) 1000 MCG tablet, Take 1,000 mcg by mouth daily. Chew, Disp: , Rfl:    exemestane (AROMASIN) 25 MG tablet, TAKE 1 TABLET (25 MG TOTAL) BY MOUTH DAILY AFTER BREAKFAST., Disp: 90 tablet, Rfl: 1   gabapentin (NEURONTIN) 300 MG capsule, Take 300 mg by mouth 2 (two) times daily., Disp: , Rfl:    Omadacycline Tosylate 150 MG TABS, Take 2 tablets (300 mg total) by mouth daily. Fast for 4 hours before taking and 2 hours after taking., Disp: 60 tablet, Rfl: 5   OVER THE COUNTER MEDICATION, Take 1 tablet by  mouth daily. Juice plus fruit blend, Disp: , Rfl:    OVER THE COUNTER MEDICATION, Take 1 capsule by mouth daily. Juice plus berry blend, Disp: , Rfl:    OVER THE COUNTER MEDICATION, Take 1 capsule by mouth daily. Juice plus veggie blend, Disp: , Rfl:    Probiotic Product (PROBIOTIC PO), Take 2 capsules by mouth daily. 800 Million CFU, Disp: , Rfl:    vitamin E 180 MG (400 UNITS) capsule, Take 400 Units by mouth daily., Disp: , Rfl:    Josephine Igo, DO [facility] 11/17/2022 9:27 AM

## 2022-11-17 NOTE — Patient Instructions (Signed)
Thank you for visiting Dr. Tonia Brooms at [facility]. Today we recommend the following:  Orders Placed This Encounter  Procedures   Procedural/ Surgical Case Request: ROBOTIC ASSISTED NAVIGATIONAL BRONCHOSCOPY   Ambulatory referral to Pulmonology   Return in about 6 weeks (around 12/26/2022). With Dr. Renold Don from ID     Please do your part to reduce the spread of COVID-19.

## 2022-11-17 NOTE — Progress Notes (Signed)
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  Presents for evaluation of a right upper lobe cavitary lesion.  She denies cough sputum production.  No incarceration, no travel outside of the country.  No known exposures to TB.  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  She is here today for follow-up of this lung nodule.  She has a CT scan scheduled for Friday.  She was going to see me last fall but there was plans for changing of her insurance and so she pushed off the appointment until the following year.  And so she is here today to talk about this she does have a follow-up appointment with medical oncology next week.  OV 12/29/2021: Here today for follow-up.Last seen in the office by Jairo Ben, NP.  Seen by Dr. Renold Don from infectious disease.  Office note reviewed from yesterday.  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  Follow-up CT imaging was canceled because she had a CT completed in the emergency room on 12/07/2021.  This follow-up CT revealed slight interval decrease in the size of the cavity within the right upper lobe.  Doing well today.  Unfortunately had a recent drug reaction with skin manifestations.  Was stopped yesterday by one of her medicines.  She has follow-up with Dr. Renold Don relating this.  She had PFTs completed prior to today's office visit which we reviewed today.  She does have a ratio of 63 with an FEV1 of 67% predicted prebronchodilator and 68% change with postbronchodilator, TLC of 126%, RV of 141%, DLCO 125%.   She has history of secondhand smoke exposure from her parents and a history of allergies as a kid.  From a respiratory standpoint she occasionally gets short of breath with significant exertion but otherwise no other symptomatology.  OV 11/17/2022: Here today for follow-up.  Initial cultures for her cavitary lesion came back with Mycobacterium abscessus she has been on a prolonged antibiotic attic course with infectious disease.  Here today for setting up of a repeat bronchoscopy for cultures that she is unable to produce significant amount of sputum to analyze.    Oncology History Overview Note  Cancer Staging Cancer of sigmoid colon [facility]) Staging form: Colon and Rectum, AJCC 8th Edition - Pathologic stage from 11/22/2019: Stage IIA (pT3, pN0, cM0) - Signed by Malachy Mood, MD on 12/17/2019  Malignant neoplasm of lower-outer quadrant of right breast of female, estrogen receptor positive (HCC) Staging form: Breast, AJCC 8th Edition - Clinical: Stage IIB (cT2, cN1, cM0, G3, ER+, PR+, HER2-) - Unsigned - Pathologic: Stage IIA (pT2, pN1(sn), cM0, G3, ER+, PR+, HER2-) - Unsigned    Malignant neoplasm of lower-outer quadrant of right breast of female, estrogen receptor positive (HCC)  10/23/2017 Initial Diagnosis    right breast lower outer quadrant biopsy 10/23/2017 for a clinical T2 N1, stage IIB invasive ductal carcinoma, grade 3, estrogen and progesterone receptor positive, HER-2 not amplified, with an MIB-1 of 20%   11/07/2017 Miscellaneous   Mammaprint on was read  as low risk, predicting no significant chemotherapy benefit with a 5-year distant disease-free survival in the 97-98% range with hormone therapy alone   12/05/2017 Surgery   status post bilateral mastectomies on 12/05/2017 showing             (a) on the left, no evidence of malignancy             (b) on the right, a pT2 pN1, stage IIB invasive ductal carcinoma, grade 3, with negative margins. By Dr Luisa Hart   01/15/2018 - 02/28/2018  Radiation Therapy   adjuvant radiation 01/15/2018 - 02/28/2018 with Dr Mitzi Hansen              Site/dose: The patient initially received a dose of 50.4 Gy in 28 fractions to the chest wall and supraclavicular region. This was delivered using a 3-D conformal, 4 field technique. The patient then received a boost to the mastectomy scar. This delivered an additional 10 Gy in 5 fractions using an en face electron field. The total dose was 60.4 Gy.    04/01/2018 -  Anti-estrogen oral therapy   started anastrozole in 04/2018. Switched to Exemestane in 08/2020 due to joint pain.             (a) bone density 03/07/2018 with a T score of -1.8. Her 04/21/20 DEXA showed osteopenia -1.8 at right total femur, with low risk for fracture.   10/28/2019 Genetic Testing   Negative genetic testing.  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  The Common Hereditary Gene Panel offered by [facility] includes sequencing and/or deletion duplication testing of the following 48 genes: APC, ATM, AXIN2, BARD1, BMPR1A, BRCA1, BRCA2, BRIP1, CDH1, CDK4, CDKN2A (p14ARF), CDKN2A (p16INK4a), CHEK2, CTNNA1, DICER1, EPCAM (Deletion/duplication testing only), GREM1 (promoter region deletion/duplication testing only), KIT, MEN1, MLH1, MSH2, MSH3, MSH6, MUTYH, NBN, NF1, NHTL1, PALB2, PDGFRA, PMS2, POLD1, POLE, PTEN, RAD50, RAD51C, RAD51D, RNF43, SDHB, SDHC, SDHD, SMAD4, SMARCA4. STK11, TP53, TSC1, TSC2, and VHL.  The following genes were evaluated for sequence changes only: SDHA and HOXB13 c.251G>A variant only. The report date is October 28, 2019.   03/08/2021 Imaging   CT Chest w/o contrast  IMPRESSION: 1. The previous FDG avid lung mass within the anterior right lower lobe has decreased in size compared with previous exam compatible with response to therapy. The cavitary lung nodule within the right apex is slightly increased in size from previous exam. Additionally, there is a new irregular nodule within the medial right  upper lobe measuring 6 mm. Previously 3 mm. 2. Interval increase in size of cavitary nodule within the right lung apex. This measures 2.3 x 1.5 cm, compared with 1.3 x 0.9 cm previously. Underlying malignancy cannot be excluded. 3. New small nodule within the right upper lobe measures 6 mm. This has a nonspecific appearance but warrants attention on follow-up imaging. 4. The remaining small pulmonary nodules noted previously are stable in the interval. 5. Coronary artery calcifications noted. 6. Aortic Atherosclerosis (ICD10-I70.0).   Cancer of sigmoid colon (HCC)  09/23/2019 Tumor Marker   Baseline CEA 1.4 on 09/23/19    09/23/2019 Procedure   Colonoscopy and Upper endoscopy with Dr Levora Angel 09/23/19  Upper Endoscopy Impression - Z-line regular, 35 cm from the incisors. - Small hiatal hernia. - Non-bleeding gastric ulcer. Biopsied. - Hematin (altered blood/coffee-ground-like material) in the gastric body. - Normal duodenal bulb, first portion of the duodenum and second portion of - Normal duodenal bulb, first portion of the duodenum and second portion  of the duodenum.  Colonoscopy Impression - Preparation of the colon was poor. - Perianal skin tags found on perianal exam. - Likely malignant tumor in the recto-sigmoid colon. Biopsied. Tattooed. - Internal hemorrhoids.   09/23/2019 Initial Biopsy   FINAL MICROSCOPIC DIAGNOSIS: 09/23/19 A. STOMACH, ULCER, BIOPSY:  - Gastric antral mucosa with mild nonspecific reactive gastropathy  - Warthin Starry stain is negative for Helicobacter pylori   B. COLON, RECTOSIGMOID, MASS, BIOPSY:  - Adenocarcinoma, see comment  COMMENT:   B.  Immunohistochemical stains for MMR-related proteins are pending and  will be reported in an addendum. Dr. Luisa Hart reviewed the case and  concurs with the diagnosis. Dr. Levora Angel was notified on 09/24/2019.     ADDENDUM:  Mismatch Repair Protein (IHC)  SUMMARY INTERPRETATION: NORMAL  There is preserved  expression of the major MMR proteins. There is a very  low probability that microsatellite instability (MSI) is present.  However, certain clinically significant MMR protein mutations may result  in preservation of nuclear expression. It is recommended that the  preservation of protein expression be correlated with molecular based  MSI testing.   IHC EXPRESSION RESULTS  TEST           RESULT  MLH1:          Preserved nuclear expression  MSH2:          Preserved nuclear expression  MSH6:          Preserved nuclear expression  PMS2:          Preserved nuclear expression    09/23/2019 Imaging   CT CAP W Contrast 09/23/19  IMPRESSION: 1. There is eccentric wall thickening of the rectosigmoid colon, in keeping with mass identified and biopsied by colonoscopy.   2. There are prominent subcentimeter lymph nodes in the sigmoid mesocolon, suspicious for nodal metastatic disease. There is no other lymphadenopathy in the abdomen or pelvis.   3. There are multiple small bilateral pulmonary nodules measuring up to 4 mm, nonspecific although very suspicious for pulmonary metastatic disease. Given history of breast malignancy, previous imaging of the chest would be very helpful to assess for stability. This report may be addended for comparison if prior imaging can be obtained.   4. Status post bilateral mastectomy with irregular subpleural opacity of the anterior right upper lobe, consistent with history of breast malignancy and radiation fibrosis.   5. There is adjacent nodularity of the right pulmonary apex, the largest discrete nodular component measuring up to 1.1 cm. This is more favored infectious or inflammatory although nonspecific and metastatic disease is not excluded. As as above, prior imaging would be very helpful to assess.   6. Small focus of nonspecific infectious or inflammatory airspace disease of the perihilar left upper lobe.   09/26/2019 Initial Diagnosis   Cancer of  sigmoid colon (HCC)   10/28/2019 PET scan   IMPRESSION: 1. Increased tracer uptake within the pelvis corresponding to wall thickening of the rectosigmoid colon favored to represent primary colorectal carcinoma. No findings of FDG avid nodal metastasis or solid organ metastasis within the abdomen or pelvis. 2. Within the right lung apex there is a cluster of 3 subpleural nodular densities which exhibit varying degrees of mild FDG uptake with SUV max between 1.9 and 3.1. Morphologically the appearance is suggestive of benign postinflammatory pleuroparenchymal scarring. This would be an atypical pattern of metastatic disease, which is considered less favored, but not excluded. Attention in this area on serial follow-up imaging is advised. 3. Two small,  less than 5 mm left lower lobe lung nodules are too small to reliably characterize. 4.  Aortic Atherosclerosis (ICD10-I70.0). 5. Nonobstructing right renal calculus.     11/22/2019 Cancer Staging   Staging form: Colon and Rectum, AJCC 8th Edition - Pathologic stage from 11/22/2019: Stage IIA (pT3, pN0, cM0) - Signed by Malachy Mood, MD on 12/17/2019   11/22/2019 Surgery   XI ROBOT ASSISTED SIGMOIDECTOMY and FLEXIBLE SIGMOIDOSCOPY by Dr white and Dr Maisie Fus   11/22/2019 Pathology Results   FINAL MICROSCOPIC DIAGNOSIS:   A. SIGMOID COLON, RESECTION:  - Invasive moderately differentiated adenocarcinoma, 4.5 cm, involving distal sigmoid colon  - Carcinoma focally invades into pericolonic soft tissue  - Resection margins are negative for carcinoma  - Negative for lymphovascular or perineural invasion  - Eighteen benign lymph nodes (0/18)  - See oncology table   B. FINAL DISTAL MARGIN, EXCISION:  - Colonic donut, negative for carcinoma    04/15/2020 Imaging   CT Chest  IMPRESSION: 1. New nodular 1.9 cm focus of consolidation in the anterior right lower lobe with surrounding mild patchy tree-in-bud opacity, nonspecific but favoring  infectious/inflammatory etiology. 2. Clustered nodularity in the apical right upper lobe is stable in size and newly cavitary. These findings are indeterminate for atypical infection such as MAI versus response to therapy of pulmonary metastases. Additional scattered tiny left pulmonary nodules are stable. Suggest close chest CT follow-up in 3 months. 3. No thoracic adenopathy. 4. Stable dilated main pulmonary artery, suggesting chronic pulmonary arterial hypertension. 5. Aortic Atherosclerosis (ICD10-I70.0).   04/17/2020 Tumor Marker   Her Guardant Reveal showed negative - ctDNA not detected.    07/08/2020 Imaging   CT Chest  IMPRESSION: Slight reduction in size of hypermetabolic right upper lobe pulmonary nodule, nonspecific though favored to represent area of infection/inflammation as was initially suggested on chest CT performed 04/15/2020. No biopsy attempted at this time.   03/08/2021 Imaging   CT Chest w/o contrast  IMPRESSION: 1. The previous FDG avid lung mass within the anterior right lower lobe has decreased in size compared with previous exam compatible with response to therapy. The cavitary lung nodule within the right apex is slightly increased in size from previous exam. Additionally, there is a new irregular nodule within the medial right upper lobe measuring 6 mm. Previously 3 mm. 2. Interval increase in size of cavitary nodule within the right lung apex. This measures 2.3 x 1.5 cm, compared with 1.3 x 0.9 cm previously. Underlying malignancy cannot be excluded. 3. New small nodule within the right upper lobe measures 6 mm. This has a nonspecific appearance but warrants attention on follow-up imaging. 4. The remaining small pulmonary nodules noted previously are stable in the interval. 5. Coronary artery calcifications noted. 6. Aortic Atherosclerosis (ICD10-I70.0).   04/04/2022 Imaging    IMPRESSION: 1. Cavitary right apical lesion has become confluent in  the interval, measuring minimally smaller at 3.1 x 2.1 cm compared to 3.2 x 2.8 cm previously. 2. Stable left lower lobe pulmonary nodules measuring up to 6 mm. 3. Enlargement of the pulmonary outflow tract/main pulmonary arteries suggests pulmonary arterial hypertension. 4.  Aortic Atherosclerosis (ICD10-I70.0).   10/21/2022 Imaging    IMPRESSION: No evidence of recurrent or metastatic carcinoma within the abdomen or pelvis.   Tiny right renal calculus. No evidence of ureteral calculi or hydronephrosis.   Large stool burden noted; recommend correlation for symptoms or signs of constipation.   Stable sub-cm left lower lobe pulmonary nodules. Recommend continued attention on follow-up imaging.  Past Medical History:  Diagnosis Date   Allergies 1995   Per patient report 11/14/18   Anemia    prior to finding colon cancer, no longer any issues with this per patient   Anxiety    Uses for sleep and long car rides   Arthritis    Breast cancer 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   Mycobacterium infection    lung   Osteopenia 03/2018   Seen on DEXA Scan   PONV (postoperative nausea and vomiting)      Family History  Problem Relation Age of Onset   Dementia Mother    Lung cancer Father        smoked   Emphysema Father    Heart disease Maternal Grandfather    Cancer Maternal Aunt 60       breast cancer    Cancer Cousin        maternal first cousin with breast cancer in her 46s     Past  Surgical History:  Procedure Laterality Date   BACK SURGERY  2003   Patient will occassional back pain since 2003   BIOPSY  09/23/2019   Procedure: BIOPSY;  Surgeon: Kathi Der, MD;  Location: [facility];  Service: Gastroenterology;;  EGD and COLON   BREAST BIOPSY Right 09/2017   BRONCHIAL BIOPSY  08/31/2021   Procedure: BRONCHIAL BIOPSIES;  Surgeon: Josephine Igo, DO;  Location: [facility];  Service: Pulmonary;;   BRONCHIAL BIOPSY  05/17/2022   Procedure: BRONCHIAL BIOPSIES;  Surgeon: Josephine Igo, DO;  Location: [facility];  Service: Pulmonary;;   BRONCHIAL BRUSHINGS  08/31/2021   Procedure: BRONCHIAL BRUSHINGS;  Surgeon: Josephine Igo, DO;  Location: [facility];  Service: Pulmonary;;   BRONCHIAL NEEDLE ASPIRATION BIOPSY  08/31/2021   Procedure: BRONCHIAL NEEDLE ASPIRATION BIOPSIES;  Surgeon: Josephine Igo, DO;  Location: [facility];  Service: Pulmonary;;   BRONCHIAL WASHINGS  08/31/2021   Procedure: BRONCHIAL WASHINGS;  Surgeon: Josephine Igo, DO;  [facility]: [facility];  Service: Pulmonary;;   BRONCHIAL WASHINGS  05/17/2022   Procedure: BRONCHIAL WASHINGS;  Surgeon: Josephine Igo, DO;  Location: [facility];  Service: Pulmonary;;   CESAREAN SECTION  1985; 1992   COLONOSCOPY WITH PROPOFOL N/A 09/23/2019   Procedure: COLONOSCOPY WITH PROPOFOL and EGD;  Surgeon: Kathi Der, MD;  Location: [facility];  Service: Gastroenterology;  Laterality: N/A;   DILATION AND CURETTAGE OF UTERUS     ESOPHAGOGASTRODUODENOSCOPY N/A 09/23/2019   Procedure: ESOPHAGOGASTRODUODENOSCOPY (EGD);  Surgeon: Kathi Der, MD;  Location: [facility];  Service: Gastroenterology;  Laterality: N/A;   FLEXIBLE SIGMOIDOSCOPY N/A 11/22/2019   Procedure: FLEXIBLE SIGMOIDOSCOPY;  Surgeon: Andria Meuse, MD;  Location: [facility];  Service: General;  Laterality: N/A;   LUMBAR DISC SURGERY  2003   MASTECTOMY Left 12/05/2017   PROPHYLACTIC MASTECTOMY   MASTECTOMY COMPLETE / SIMPLE W/  SENTINEL NODE BIOPSY Right 12/05/2017   WITH RADIOACTIVE SEED TARGETED  RIGHT AXIILARY LYMPH NODE EXCISION AND RIGHT SENTINEL LYMPH NODE BIOPSY,    MASTECTOMY WITH RADIOACTIVE SEED GUIDED EXCISION AND AXILLARY SENTINEL LYMPH NODE BIOPSY Bilateral 12/05/2017   Procedure: RIGHT SIMPLE MASTECTOMY WITH RADIOACTIVE SEED TARGETED RIGHT AXIILARY LYMPH NODE EXCISION AND RIGHT SENTINEL LYMPH NODE BIOPSY, LEFT PROPHYLACTIC MASTECTOMY;  Surgeon: Harriette Bouillon, MD;  Location: [facility];  Service: General;  Laterality: Bilateral;   SUBMUCOSAL TATTOO INJECTION  09/23/2019   Procedure: SUBMUCOSAL TATTOO INJECTION;  Surgeon: Kathi Der, MD;  Location: [facility];  Service: Gastroenterology;;   TOTAL HIP ARTHROPLASTY Right 07/12/2022   Procedure: RIGHT TOTAL HIP ARTHROPLASTY ANTERIOR APPROACH;  Surgeon: Ernestina Columbia, MD;  Location: [facility];  Service: Orthopedics;  Laterality: Right;   VIDEO BRONCHOSCOPY WITH RADIAL ENDOBRONCHIAL ULTRASOUND  08/31/2021   Procedure: RADIAL ENDOBRONCHIAL ULTRASOUND;  Surgeon: Josephine Igo, DO;  [facility];  Service: Pulmonary;;    Social History   Socioeconomic History   Marital status: Married    Spouse name: Not on file   Number of children: 4   Years of education: Not on file   Highest education level: Not on file  Occupational History   Not on file  Tobacco Use   Smoking status: Never   Smokeless tobacco: Never  Vaping Use   Vaping status: Never Used  Substance and Sexual Activity   Alcohol use: Yes    Alcohol/week: 7.0 standard drinks of alcohol    Types: 7 Glasses of wine per week    Comment: 2-3 3oz glasses of wine per day   Drug use: Never   Sexual activity: Not on file  Other Topics Concern   Not on file  Social History Narrative   Not on file   Social Determinants of Health   Financial Resource Strain: Low Risk  (08/23/2022)   Received from [facility], [facility]   Overall Financial Resource Strain (CARDIA)    Difficulty of  Paying Living Expenses: Not hard at all  Food Insecurity: No Food Insecurity (08/23/2022)   Received from [facility], [facility]   Hunger Vital Sign    Worried About Running Out of Food in the Last Year: Never true    Ran Out of Food in the Last Year: Never true  Transportation Needs: No Transportation Needs (08/23/2022)   Received from [facility], [facility]   PRAPARE - Transportation    Lack of Transportation (Medical): No    Lack of Transportation (Non-Medical): No  Physical Activity: Insufficiently Active (08/23/2022)   Received from [facility], [facility]   Exercise Vital Sign    Days of Exercise per Week: 4 days    Minutes of Exercise per Session: 10 min  Stress: Stress Concern Present (08/23/2022)   Received from [facility], [facility] - Occupational Stress Questionnaire    Feeling of Stress : To some extent  Social Connections: Socially Integrated (08/23/2022)   Received from [facility], [facility]   Social Network    How would you rate your social network (family, work, friends)?: Good participation with social networks  Intimate Partner Violence: Not At Risk (08/23/2022)   Received from [facility], [facility]   HITS    Over the last 12 months how often did your partner physically hurt you?: 1    Over the last 12 months how often did your partner insult you or talk down to you?: 1    Over the last 12 months how often  did your partner threaten you with physical harm?: 1    Over the last 12 months how often did your partner scream or curse at you?: 1     Allergies  Allergen Reactions   Tigecycline Diarrhea and Nausea And Vomiting   Codeine Nausea And Vomiting and Other (See Comments)    headache   Heparin Rash    Pt states she tolerates heparin flush for PICC but gets a full body rash with drip     Outpatient Medications Prior to Visit  Medication Sig Dispense Refill   albuterol (VENTOLIN HFA)  108 (90 Base) MCG/ACT inhaler TAKE 2 PUFFS BY MOUTH EVERY 6 HOURS AS NEEDED FOR WHEEZE OR SHORTNESS OF BREATH 18 each 2   ALPRAZolam (NIRAVAM) 0.5 MG dissolvable tablet Take 0.5 mg by mouth daily as needed (Sleep/Travel).     Calcium Carbonate-Vitamin D (CALCIUM 600+D PO) Take 600 mg by mouth daily.     celecoxib (CELEBREX) 100 MG capsule Take 1 capsule (100 mg total) by mouth 2 (two) times daily. 60 capsule 2   Cholecalciferol (VITAMIN D) 50 MCG (2000 UT) tablet Take 2,000 Units by mouth daily.     CLOFAZIMINE PO Take 100 mg by mouth daily.     cyanocobalamin (VITAMIN B12) 1000 MCG tablet Take 1,000 mcg by mouth daily. Chew     exemestane (AROMASIN) 25 MG tablet TAKE 1 TABLET (25 MG TOTAL) BY MOUTH DAILY AFTER BREAKFAST. 90 tablet 1   gabapentin (NEURONTIN) 300 MG capsule Take 300 mg by mouth 2 (two) times daily.     Omadacycline Tosylate 150 MG TABS Take 2 tablets (300 mg total) by mouth daily. Fast for 4 hours before taking and 2 hours after taking. 60 tablet 5   OVER THE COUNTER MEDICATION Take 1 tablet by mouth daily. Juice plus fruit blend     OVER THE COUNTER MEDICATION Take 1 capsule by mouth daily. Juice plus berry blend     OVER THE COUNTER MEDICATION Take 1 capsule by mouth daily. Juice plus veggie blend     Probiotic Product (PROBIOTIC PO) Take 2 capsules by mouth daily. 800 Million CFU     vitamin E 180 MG (400 UNITS) capsule Take 400 Units by mouth daily.     azithromycin (ZITHROMAX) 500 MG tablet TAKE 1 TABLET (500 MG TOTAL) BY MOUTH DAILY. 90 tablet 3   linezolid (ZYVOX) 600 MG tablet Take 1 tablet (600 mg total) by mouth daily. 30 tablet 5   promethazine (PHENERGAN) 12.5 MG tablet Take 1 tablet (12.5 mg total) by mouth every 6 (six) hours as needed for nausea or vomiting. 30 tablet 0   No facility-administered medications prior to visit.    Review of Systems  Constitutional:  Negative for chills, fever, malaise/fatigue and weight loss.  HENT:  Positive for hearing loss.  Negative for sore throat and tinnitus.   Eyes:  Negative for blurred vision and double vision.  Respiratory:  Negative for cough, hemoptysis, sputum production, shortness of breath, wheezing and stridor.   Cardiovascular:  Negative for chest pain, palpitations, orthopnea, leg swelling and PND.  Gastrointestinal:  Negative for abdominal pain, constipation, diarrhea, heartburn, nausea and vomiting.  Genitourinary:  Negative for dysuria, hematuria and urgency.  Musculoskeletal:  Negative for joint pain and myalgias.  Skin:  Negative for itching and rash.  Neurological:  Negative for dizziness, tingling, weakness and headaches.  Endo/Heme/Allergies:  Negative for environmental allergies. Does not bruise/bleed easily.  Psychiatric/Behavioral:  Negative for depression. The patient is  not nervous/anxious and does not have insomnia.   All other systems reviewed and are negative.    Objective:  Physical Exam Vitals reviewed.  Constitutional:      General: She is not in acute distress.    Appearance: She is well-developed.  HENT:     Head: Normocephalic and atraumatic.  Eyes:     General: No scleral icterus.    Conjunctiva/sclera: Conjunctivae normal.     Pupils: Pupils are equal, round, and reactive to light.  Neck:     Vascular: No JVD.     Trachea: No tracheal deviation.  Cardiovascular:     Rate and Rhythm: Normal rate and regular rhythm.     Heart sounds: Normal heart sounds. No murmur heard. Pulmonary:     Effort: Pulmonary effort is normal. No tachypnea, accessory muscle usage or respiratory distress.     Breath sounds: Normal breath sounds. No stridor. No wheezing, rhonchi or rales.  Abdominal:     General: Bowel sounds are normal. There is no distension.     Palpations: Abdomen is soft.     Tenderness: There is no abdominal tenderness.  Musculoskeletal:        General: No tenderness.     Cervical back: Neck supple.  Lymphadenopathy:     Cervical: No cervical adenopathy.   Skin:    General: Skin is warm and dry.     Capillary Refill: Capillary refill takes less than 2 seconds.     Findings: No rash.  Neurological:     Mental Status: She is alert and oriented to person, place, and time.  Psychiatric:        Behavior: Behavior normal.      Vitals:   11/17/22 0913  BP: 120/80  Pulse: 71  SpO2: 98%  Weight: 144 lb 12.8 oz (65.7 kg)  Height: 5\' 3"  (1.6 m)    98% on RA BMI Readings from Last 3 Encounters:  11/17/22 25.65 kg/m  09/14/22 25.86 kg/m  07/12/22 25.69 kg/m   Wt Readings from Last 3 Encounters:  11/17/22 144 lb 12.8 oz (65.7 kg)  09/14/22 146 lb (66.2 kg)  07/12/22 145 lb (65.8 kg)     CBC    Component Value Date/Time   WBC 2.8 (L) 10/21/2022 1124   WBC 3.4 (L) 09/14/2022 0355   RBC 3.16 (L) 10/21/2022 1124   HGB 8.0 (L) 10/21/2022 1124   HCT 25.3 (L) 10/21/2022 1124   PLT 249 10/21/2022 1124   MCV 80.1 10/21/2022 1124   MCH 25.3 (L) 10/21/2022 1124   MCHC 31.6 10/21/2022 1124   RDW 19.7 (H) 10/21/2022 1124   LYMPHSABS 0.6 (L) 10/21/2022 1124   MONOABS 0.3 10/21/2022 1124   EOSABS 0.1 10/21/2022 1124   BASOSABS 0.0 10/21/2022 1124    Chest Imaging: CT chest November 2022: Right upper lobe cavitary lesion and smaller pulmonary nodules bilaterally.  Increased size of the nodule in the right lung. The patient's images have been independently reviewed by me.    April 2024: Lung nodule in the right upper lobe more solid now, less cavitary. CT imaging reviewed independently by me today in the office.   Pulmonary Functions Testing Results:    Latest Ref Rng & Units 12/29/2021   10:50 AM  PFT Results  FVC-Pre L 3.07   FVC-Predicted Pre % 105   FVC-Post L 3.30   FVC-Predicted Post % 113   Pre FEV1/FVC % % 48   Post FEV1/FCV % % 76   FEV1-Pre  L 1.48   FEV1-Predicted Pre % 67   FEV1-Post L 2.50   DLCO uncorrected ml/min/mmHg 23.71   DLCO UNC% % 125   DLCO corrected ml/min/mmHg 24.58   DLCO COR %Predicted %  130   DLVA Predicted % 118   TLC L 6.18   TLC % Predicted % 126   RV % Predicted % 141     FeNO: none  Pathology: none  Echocardiogram: none  Heart Catheterization: none    Assessment & Plan:     ICD-10-CM   1. Lung nodule  R91.1 Procedural/ Surgical Case Request: ROBOTIC ASSISTED NAVIGATIONAL BRONCHOSCOPY    Ambulatory referral to Pulmonology    2. Cavitary lesion of lung  J98.4 Procedural/ Surgical Case Request: ROBOTIC ASSISTED NAVIGATIONAL BRONCHOSCOPY    Ambulatory referral to Pulmonology    3. Mycobacterial disease, pulmonary (HCC)  A31.0 Procedural/ Surgical Case Request: ROBOTIC ASSISTED NAVIGATIONAL BRONCHOSCOPY    Ambulatory referral to Pulmonology       Discussion:  This is a 72 year old female, evaluation of right upper lobe cavitary lesion that was taken for bronchoscopy and diagnosed with Mycobacterium abscessus.  She has a history of breast cancer and colon cancer a lifelong non-smoker.  She does have secondhand smoke exposure and she had PFTs with no evidence of obstruction but does have some reversibility with evidence of air trapping and mildly elevated DLCO.  Recent cultures from sputum have been negative and she is very difficult to produce sputum infectious disease would like to have repeat bronchoscopy with cultures.  Plan: She was stopped off her azithromycin due to recent changes in hearing. She is still on therapy for her Mycobacterium abscessus including some new oral antibiotic regimens. I have communicated with infectious disease and we will plan with a repeat bronchoscopy. Tentative bronchoscopy date will be on December 05, 2022. We appreciate help [facility] scheduling.  She does not need to follow-up with [place] immediately after bronchoscopy but would schedule follow-up 4 weeks after bronchoscopy with Dr. Bartholomew Crews and infectious disease to be able to review culture results and make decisions regarding ongoing antimicrobial therapy.    Current Outpatient  Medications:    albuterol (VENTOLIN HFA) 108 (90 Base) MCG/ACT inhaler, TAKE 2 PUFFS BY MOUTH EVERY 6 HOURS AS NEEDED FOR WHEEZE OR SHORTNESS OF BREATH, Disp: 18 each, Rfl: 2   ALPRAZolam (NIRAVAM) 0.5 MG dissolvable tablet, Take 0.5 mg by mouth daily as needed (Sleep/Travel)., Disp: , Rfl:    Calcium Carbonate-Vitamin D (CALCIUM 600+D PO), Take 600 mg by mouth daily., Disp: , Rfl:    celecoxib (CELEBREX) 100 MG capsule, Take 1 capsule (100 mg total) by mouth 2 (two) times daily., Disp: 60 capsule, Rfl: 2   Cholecalciferol (VITAMIN D) 50 MCG (2000 UT) tablet, Take 2,000 Units by mouth daily., Disp: , Rfl:    CLOFAZIMINE PO, Take 100 mg by mouth daily., Disp: , Rfl:    cyanocobalamin (VITAMIN B12) 1000 MCG tablet, Take 1,000 mcg by mouth daily. Chew, Disp: , Rfl:    exemestane (AROMASIN) 25 MG tablet, TAKE 1 TABLET (25 MG TOTAL) BY MOUTH DAILY AFTER BREAKFAST., Disp: 90 tablet, Rfl: 1   gabapentin (NEURONTIN) 300 MG capsule, Take 300 mg by mouth 2 (two) times daily., Disp: , Rfl:    Omadacycline Tosylate 150 MG TABS, Take 2 tablets (300 mg total) by mouth daily. Fast for 4 hours before taking and 2 hours after taking., Disp: 60 tablet, Rfl: 5   OVER THE COUNTER MEDICATION, Take 1 tablet by  mouth daily. Juice plus fruit blend, Disp: , Rfl:    OVER THE COUNTER MEDICATION, Take 1 capsule by mouth daily. Juice plus berry blend, Disp: , Rfl:    OVER THE COUNTER MEDICATION, Take 1 capsule by mouth daily. Juice plus veggie blend, Disp: , Rfl:    Probiotic Product (PROBIOTIC PO), Take 2 capsules by mouth daily. 800 Million CFU, Disp: , Rfl:    vitamin E 180 MG (400 UNITS) capsule, Take 400 Units by mouth daily., Disp: , Rfl:    Josephine Igo, DO [facility] 11/17/2022 9:27 AM

## 2022-11-21 ENCOUNTER — Inpatient Hospital Stay: Payer: Commercial Managed Care - PPO

## 2022-11-21 ENCOUNTER — Other Ambulatory Visit: Payer: Self-pay

## 2022-11-21 DIAGNOSIS — K648 Other hemorrhoids: Secondary | ICD-10-CM | POA: Diagnosis not present

## 2022-11-21 DIAGNOSIS — M85851 Other specified disorders of bone density and structure, right thigh: Secondary | ICD-10-CM | POA: Diagnosis not present

## 2022-11-21 DIAGNOSIS — R58 Hemorrhage, not elsewhere classified: Secondary | ICD-10-CM | POA: Diagnosis not present

## 2022-11-21 DIAGNOSIS — I700 Atherosclerosis of aorta: Secondary | ICD-10-CM | POA: Diagnosis not present

## 2022-11-21 DIAGNOSIS — C50511 Malignant neoplasm of lower-outer quadrant of right female breast: Secondary | ICD-10-CM | POA: Diagnosis not present

## 2022-11-21 DIAGNOSIS — K449 Diaphragmatic hernia without obstruction or gangrene: Secondary | ICD-10-CM | POA: Diagnosis not present

## 2022-11-21 DIAGNOSIS — Z79811 Long term (current) use of aromatase inhibitors: Secondary | ICD-10-CM | POA: Diagnosis not present

## 2022-11-21 DIAGNOSIS — Z9013 Acquired absence of bilateral breasts and nipples: Secondary | ICD-10-CM | POA: Diagnosis not present

## 2022-11-21 DIAGNOSIS — Z923 Personal history of irradiation: Secondary | ICD-10-CM | POA: Diagnosis not present

## 2022-11-21 DIAGNOSIS — I2510 Atherosclerotic heart disease of native coronary artery without angina pectoris: Secondary | ICD-10-CM | POA: Diagnosis not present

## 2022-11-21 DIAGNOSIS — M1990 Unspecified osteoarthritis, unspecified site: Secondary | ICD-10-CM | POA: Diagnosis not present

## 2022-11-21 DIAGNOSIS — Z79899 Other long term (current) drug therapy: Secondary | ICD-10-CM | POA: Diagnosis not present

## 2022-11-21 DIAGNOSIS — C187 Malignant neoplasm of sigmoid colon: Secondary | ICD-10-CM | POA: Diagnosis present

## 2022-11-21 DIAGNOSIS — Z888 Allergy status to other drugs, medicaments and biological substances status: Secondary | ICD-10-CM | POA: Diagnosis not present

## 2022-11-21 DIAGNOSIS — K644 Residual hemorrhoidal skin tags: Secondary | ICD-10-CM | POA: Diagnosis not present

## 2022-11-21 DIAGNOSIS — N200 Calculus of kidney: Secondary | ICD-10-CM | POA: Diagnosis not present

## 2022-11-21 DIAGNOSIS — D500 Iron deficiency anemia secondary to blood loss (chronic): Secondary | ICD-10-CM | POA: Diagnosis not present

## 2022-11-21 DIAGNOSIS — Z170 Estrogen receptor positive status [ER+]: Secondary | ICD-10-CM | POA: Diagnosis not present

## 2022-11-21 DIAGNOSIS — Z885 Allergy status to narcotic agent status: Secondary | ICD-10-CM | POA: Diagnosis not present

## 2022-11-21 DIAGNOSIS — D649 Anemia, unspecified: Secondary | ICD-10-CM

## 2022-11-21 DIAGNOSIS — C7800 Secondary malignant neoplasm of unspecified lung: Secondary | ICD-10-CM | POA: Diagnosis not present

## 2022-11-21 LAB — CMP (CANCER CENTER ONLY)
ALT: 20 U/L (ref 0–44)
AST: 23 U/L (ref 15–41)
Albumin: 4 g/dL (ref 3.5–5.0)
Alkaline Phosphatase: 76 U/L (ref 38–126)
Anion gap: 6 (ref 5–15)
BUN: 16 mg/dL (ref 8–23)
CO2: 24 mmol/L (ref 22–32)
Calcium: 9.4 mg/dL (ref 8.9–10.3)
Chloride: 109 mmol/L (ref 98–111)
Creatinine: 0.74 mg/dL (ref 0.44–1.00)
GFR, Estimated: >60 mL/min (ref >60)
Glucose, Bld: 88 mg/dL (ref 70–99)
Potassium: 4.4 mmol/L (ref 3.5–5.1)
Sodium: 139 mmol/L (ref 135–145)
Total Bilirubin: 0.4 mg/dL (ref 0.3–1.2)
Total Protein: 6.1 g/dL — ABNORMAL LOW (ref 6.5–8.1)

## 2022-11-21 LAB — TYPE AND SCREEN
ABO/RH(D): A POS
Antibody Screen: NEGATIVE

## 2022-11-21 LAB — CBC WITH DIFFERENTIAL (CANCER CENTER ONLY)
Abs Immature Granulocytes: 0.01 10*3/uL (ref 0.00–0.07)
Basophils Absolute: 0 10*3/uL (ref 0.0–0.1)
Basophils Relative: 1 %
Eosinophils Absolute: 0.1 10*3/uL (ref 0.0–0.5)
Eosinophils Relative: 4 %
HCT: 30.8 % — ABNORMAL LOW (ref 36.0–46.0)
Hemoglobin: 9.6 g/dL — ABNORMAL LOW (ref 12.0–15.0)
Immature Granulocytes: 0 %
Lymphocytes Relative: 24 %
Lymphs Abs: 0.7 10*3/uL (ref 0.7–4.0)
MCH: 24.8 pg — ABNORMAL LOW (ref 26.0–34.0)
MCHC: 31.2 g/dL (ref 30.0–36.0)
MCV: 79.6 fL — ABNORMAL LOW (ref 80.0–100.0)
Monocytes Absolute: 0.3 10*3/uL (ref 0.1–1.0)
Monocytes Relative: 10 %
Neutro Abs: 1.9 10*3/uL (ref 1.7–7.7)
Neutrophils Relative %: 61 %
Platelet Count: 290 10*3/uL (ref 150–400)
RBC: 3.87 MIL/uL (ref 3.87–5.11)
RDW: 18.6 % — ABNORMAL HIGH (ref 11.5–15.5)
WBC Count: 3.1 10*3/uL — ABNORMAL LOW (ref 4.0–10.5)
nRBC: 0 % (ref 0.0–0.2)

## 2022-11-21 LAB — VITAMIN B12: Vitamin B-12: 1397 pg/mL — ABNORMAL HIGH (ref 180–914)

## 2022-11-21 LAB — FERRITIN: Ferritin: 8 ng/mL — ABNORMAL LOW (ref 11–307)

## 2022-11-21 LAB — RETIC PANEL
Immature Retic Fract: 11.7 % (ref 2.3–15.9)
RBC.: 3.78 MIL/uL — ABNORMAL LOW (ref 3.87–5.11)
Retic Count, Absolute: 46.9 10*3/uL (ref 19.0–186.0)
Retic Ct Pct: 1.2 % (ref 0.4–3.1)
Reticulocyte Hemoglobin: 27.7 pg — ABNORMAL LOW (ref >27.9)

## 2022-11-21 LAB — TSH: TSH: 1.436 u[IU]/mL (ref 0.350–4.500)

## 2022-11-22 ENCOUNTER — Other Ambulatory Visit: Payer: Commercial Managed Care - PPO

## 2022-11-22 LAB — FOLATE RBC
Folate, Hemolysate: 341 ng/mL
Folate, RBC: 1093 ng/mL (ref >498)
Hematocrit: 31.2 % — ABNORMAL LOW (ref 34.0–46.6)

## 2022-11-22 LAB — ERYTHROPOIETIN: Erythropoietin: 45.3 m[IU]/mL — ABNORMAL HIGH (ref 2.6–18.5)

## 2022-11-23 LAB — HAPTOGLOBIN: Haptoglobin: 103 mg/dL (ref 37–355)

## 2022-11-24 ENCOUNTER — Ambulatory Visit (INDEPENDENT_AMBULATORY_CARE_PROVIDER_SITE_OTHER): Payer: Commercial Managed Care - PPO | Admitting: Internal Medicine

## 2022-11-24 ENCOUNTER — Other Ambulatory Visit: Payer: Self-pay

## 2022-11-24 VITALS — BP 153/89 | HR 73 | Temp 98.2°F | Wt 145.0 lb

## 2022-11-24 DIAGNOSIS — A318 Other mycobacterial infections: Secondary | ICD-10-CM

## 2022-11-24 LAB — METHYLMALONIC ACID, SERUM: Methylmalonic Acid, Quantitative: 230 nmol/L (ref 0–378)

## 2022-11-24 NOTE — Patient Instructions (Signed)
Let's keep on the nyzura and clofazimine and not add any other antibiotics   Will see if the sputum turns negative    I will refer you to [facility] as well to have second opinion on your antibiotics regimen   Your white cell count is a little low but not continuously trending down. And I don't believe the 2 antibiotics have anything to do with that

## 2022-11-24 NOTE — Progress Notes (Signed)
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 Was supposed to be seen several months prior but didn't show.    08/31/21 underwent bronch: "The distinct navigation pathways prepared prior to this procedure were then utilized to navigate to patient's lesion identified on CT scan. The robotic catheter was secured into place and the vision probe was withdrawn.  Lesion location was approximated using fluoroscopy and radial endobronchial ultrasound for peripheral targeting. Under fluoroscopic guidance transbronchial needle brushings, transbronchial needle biopsies, and transbronchial forceps biopsies were performed to be sent for cytology and pathology. A bronchioalveolar lavage was performed in the  right upper lobe and sent for microbiology.  Additional transbronchial biopsies sent for tissue culture.   At the end of the procedure a general airway inspection was performed and there was no evidence of active bleeding. The bronchoscope was removed.  The patient tolerated the procedure well. There was no significant blood loss and there were no obvious complications. A post-procedural chest x-ray is pending."  Cytology didn't see cancer cells  On micro -- something is growing that looks like ntm and the MALDI technology calls it abscessus but it was sent on 5/8th to reference lab to identify   Patient without respiratory sx outside of stable mild dyspnea on exertion 4-5 months. She would encounter this walking up a hill No b sx Has watery eyes and a recent dry cough but query if it's allergy No reflux No decreased appetite No weight loss   11/04/21 id clinic f/u Patient is back for f/u post susceptibility testing which show azithromycin/amikacin sensitive m abscessus She continues to feel well without b sx, cough, dyspnea, chest pain She is accompanied by her significant others today  12/28/21 id clinic f/u Patient developed itchy rash about 10 days ago involving trunk/ext. Takes benadryl and topical hydrocortisone. 2 benadryls to help sleep. Also takes claritin. Rash is getting worse Opat labs elevated lft but improving (moderate in 100s), without eosinophilia Amikacin trough 8/24 is 0.8 with creatinine 0.98 No fever chill Occasional cough Went to ed 8/08 for sob after extreme exertion and chest ct was done -- no pe. Improved cavitation.  No joint pain/muscle aching No new vision change; she endorses having cataracts Has chronic leg cramps which she has been  using tonic water for 6 months. Question of some tingling in hands/feet very mild  At some point she has recurrent elevation ak trough and cr and we had planned to substitute amikacin with linezolid but she done well as above  with AK....  01/21/22 id clinic f/u Cefoxitin was hold first week of 12/2021 Her rash is much better She does at one point had yeast vaginal infection and thrush about 2 weeks ago (she got fluconazole for vaginal yeast dose and some other topical gel otc for her mouth). She still feel some taste change on her tongue/feels raw as well with hot temperature especially No visual changes No numbness/tingling hands feet Current regimen: Amikacin Azith Clofazimine Linezolid (Doxy) Waiting for omadocycline approval again from insurance No n/v/diarrhea Rare cough Stable short of breath  She takes b6 daily for a while now  She had submitted 3 afb sputum but said the sputum are mostly upper oral. She can't get sputum up   02/08/22 id clinic f/u Patient last took abx a week ago. She was on tigycline at that time and had severe intolerable nausea/vomiting The doxycycline is out of the question is it should never be used to m-abscessus  She is ready to start again  03/16/22 id clinic f/u Patient started on amikacin, omado, clofaz, and azithromycin within a few days of 02/08/22 visit She is doing fine on those without any side effect Cough minimal/stable No b sx No weight loss -- weight gain actually No dyspnea Will have body ct to f/u breast cancer this December Can't get sputum up    06/16/22 id clinic f/u Had bronch mid jan and afb cx negative Chest ct 04/2022 showed stable cavitary changes sign but had become confluent No f/c/nightsweat, cough Not able to get up any sputum Reviewed opat labs. Normal creatinine and crp No n/v/diarrhea No rash  Skin is more pigmented and she asked why    09/14/22 id clinic f/u Had elective right hip arthroplasty 07/12/22. Not able to fully bear weight yet but improving. The incision had not fully closed -- there is one area about 1 cm along the incision inferiorly that is "gold" color on bandage. Size smaller. No pus, fever, chill. She is  following up with [facility]; 2 weeks prior to this visit there was a probe into the wound opening and testing was sent but she is not sure what was tested. No new abx She is tolerating clofaz/omado/clarithro No n/v/diarrhea No rash No cough She has seasonal allergy spring and fall bad 3 weeks prior developed pressure sensation in her head and had muffled hearing since. No facial pain. No purulent discharge from nose.  No teeth pain. She is being referred to ent Intermittent dry cough with her allergy      11/24/22 id clinic f/u   Review of Systems: ROS All other ros negative      Past Medical History:  Diagnosis Date   Allergies 1995   Per patient report 11/14/18   Anemia    prior to finding colon cancer, no longer any issues with this per patient   Anxiety    Uses for sleep and long car rides   Arthritis    Breast cancer (HCC)    Breast cancer, right breast (HCC) 09/2017   S/P mastectomy 12/05/2017   Colon cancer (HCC)    COPD (chronic obstructive pulmonary disease) (HCC)    pt reports previous MD diagnosed her with this, but takes no medications   Dyspnea  occasionally with exertion   Family history of breast cancer    Headache 1968   per patient report, started as a teenager   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   Mycobacterium infection    lung   Osteopenia 03/2018   Seen on DEXA Scan   PONV (postoperative nausea and vomiting)     Social History   Tobacco Use   Smoking status: Never   Smokeless tobacco: Never  Vaping Use   Vaping status: Never Used  Substance Use Topics   Alcohol use: Yes    Alcohol/week: 7.0 standard drinks of alcohol    Types: 7 Glasses of wine per week    Comment: 2-3 3oz glasses of wine per day   Drug use: Never    Family History  Problem Relation Age of Onset   Dementia Mother    Lung cancer Father         smoked   Emphysema Father    Heart disease Maternal Grandfather    Cancer Maternal Aunt 58       breast cancer    Cancer Cousin        maternal first cousin with breast cancer in her 26s    Allergies  Allergen Reactions   Tigecycline Diarrhea and Nausea And Vomiting   Codeine Nausea And Vomiting and Other (See Comments)    headache   Heparin Rash    Pt states she tolerates heparin flush for PICC but gets a full body rash with drip    OBJECTIVE: Vitals:   11/24/22 1550  BP: (!) 153/89  Pulse: 73  Temp: 98.2 F (36.8 C)  TempSrc: Oral  SpO2: 99%  Weight: 145 lb (65.8 kg)   Body mass index is 25.69 kg/m.   Physical Exam General/constitutional: no distress, pleasant HEENT: Normocephalic, PER, Conj Clear, EOMI, Oropharynx clear -- ear canals with minimal dry ear wax but normal appearing TM lining without evidence of fluid behind tm Neck supple CV: rrr no mrg Lungs: clear to auscultation, normal respiratory effort Abd: Soft, Nontender Ext: no edema Skin: No Rash Neuro: nonfocal MSK: no peripheral joint swelling/tenderness/warmth; back spines nontender         Lab: Lab Results  Component Value Date   WBC 3.1 (L) 11/21/2022   HGB 9.6 (L) 11/21/2022   HCT 31.2 (L) 11/21/2022   HCT 30.8 (L) 11/21/2022   MCV 79.6 (L) 11/21/2022   PLT 290 11/21/2022   Last metabolic panel Lab Results  Component Value Date   GLUCOSE 88 11/21/2022   NA 139 11/21/2022   K 4.4 11/21/2022   CL 109 11/21/2022   CO2 24 11/21/2022   BUN 16 11/21/2022   CREATININE 0.74 11/21/2022   GFRNONAA >60 11/21/2022   CALCIUM 9.4 11/21/2022   PHOS 3.6 11/14/2018   PROT 6.1 (L) 11/21/2022   ALBUMIN 4.0 11/21/2022   BILITOT 0.4 11/21/2022   ALKPHOS 76 11/21/2022   AST 23 11/21/2022   ALT 20 11/21/2022   ANIONGAP 6 11/21/2022    Microbiology:  Serology:  Imaging: I have reviewed the imaging personally and incorporated into decision making  08/06/21 chest abd pelv ct with  contrast FINDINGS: CT CHEST FINDINGS   Cardiovascular: Aortic atherosclerosis. Normal heart size. No pericardial effusion.   Mediastinum/Nodes: No enlarged mediastinal, hilar, or axillary lymph nodes. Thyroid gland, trachea, and esophagus demonstrate no significant findings.  Lungs/Pleura: Interval increase in wall thickening and cavitation within the right pulmonary apex, overall lesion now confluent and measuring 3.8 x 3.1 cm (series 4, image 24). Increased clustered centrilobular nodularity and small consolidations in this vicinity (series 4, image 37). Residual bandlike scarring in the bilateral lung bases (series 4, image 109, 122). Occasional small nodules of the left lower lobe measuring up to 0.5 cm (series 4, image 21). No pleural effusion or pneumothorax.   Musculoskeletal: No chest wall mass or suspicious osseous lesions identified. Status post bilateral mastectomy.   CT ABDOMEN PELVIS FINDINGS   Hepatobiliary: No solid liver abnormality is seen. No gallstones, gallbladder wall thickening, or biliary dilatation.   Pancreas: Unremarkable. No pancreatic ductal dilatation or surrounding inflammatory changes.   Spleen: Normal in size without significant abnormality.   Adrenals/Urinary Tract: Adrenal glands are unremarkable. Punctuate nonobstructive calculus in the inferior pole of the right kidney (series 5, image 89). No left-sided calculi, ureteral calculi, or hydronephrosis. Bladder is unremarkable.   Stomach/Bowel: Stomach is within normal limits. Appendix is not clearly visualized and may be surgically absent. The cecum is anterior to the liver. Status post sigmoid colon resection and reanastomosis. No evidence of bowel wall thickening, distention, or inflammatory changes.   Vascular/Lymphatic: Aortic atherosclerosis. No enlarged abdominal or pelvic lymph nodes.   Reproductive: No mass or other abnormality.   Other: No abdominal wall hernia or  abnormality. No ascites.   Musculoskeletal: No acute osseous findings.   IMPRESSION: 1. Status post sigmoid colon resection and reanastomosis. No evidence of lymphadenopathy or metastatic disease in the chest, abdomen, or pelvis. 2. Interval increase in wall thickening and cavitation within the right pulmonary apex, overall lesion now confluent and measuring 3.8 x 3.1 cm. Increased clustered centrilobular nodularity and small consolidations in this vicinity. Findings are consistent with ongoing, worsened cavitary infection. 3. Additional small nodules of the left lower lobe measuring up to 0.5 cm, unchanged and almost certainly benign, incidental sequelae of infection inflammation. 4. Nonobstructive right nephrolithiasis.   12/07/21  chest cta 1. No pulmonary embolism. No acute intrathoracic pathology identified. 2. Slight interval decrease in size of a cavitary lesion within the right upper lobe in keeping with at least partial response to therapy. Repeat imaging in 6-12 months would be helpful in confirming stability or resolution exclude the possibility of an underlying cavitating malignancy 3. Stable 5 mm subpleural pulmonary nodule within the left lower lobe, safely considered benign.   Assessment/plan: Problem List Items Addressed This Visit   None Abx: 10/10 - c omadocycline, clofaz, azith  02/08/22-06/16/22 amikacin (9/7-10/03 doxy) 9/6-10/03 linezolid 7/6-10/03 amikacin 7/6-10/03 azith 7/6-10/03 clofazimine  7/6-9/07 cefoxitin    Discussed natural history, epidimiology, pathogenesis, diagnosis criteria and treatment indication for ntm lungs  Discussed resistant nature of abscessus, cure rate, relapse rate  At this time she is completely assymptomatic but imaging has indicated progression of cavitary lesion. On cytology there was no malignant cells. She had hx of colon cancer and breast cancer at least 3 years prior to this admission, and all are in  remission  At this time culture is still pending final ID confirmation and susceptibility testing  I would treat her based on progression of ct scan  Will await susceptibility and follow up with her in around 4-6 weeks to decide on treatment regimen    7/6 assessment Doing well Due to cavitary progression will do 2 iv's and 2 po's  Initial regimen will be iv amikacin, cefoxitin and po clofazimine and azithromycin  The initial regimen will be at least 1-3 months as long as she can tolerate.  After the 3 months potentially we can keep 1 iv and the 2 PO's or transition her iv's to inhaled amikacin with the 2 po's  Our pharmacist team speaks with her today. Will start clofaz and azith today  Once picc is in she can start the cefoxitin continuous daily infusion and tiw amikacin   Repeat chest ct in 6-8 weeks Repeat afb sputum x3 1 month after starting medication   8/29 assessment Ct improved on 8/8 Rash ?cefoxitin. No sign of DRESS or arthus reaction otherwise. Will look in to the newer tetracycline along with bedaquilline for her. Can continue as is for now her current drug regimen 3 sputum early morning ordered for afb testing -- discussed to do it before eating/drinking/brushing/gargling Repeat chest ct in 3-6 months   Discussed with our id pharmacy team   ----------- 01/21/22 id assessment For some reason insurance is making it difficult in getting omadocycline approved. They want her to try doxycycline first  There is no evidence doxycycline works for [employer], and omadocycline or tigecycline would need to be the back bone of treatment (tigecycline of course much more adverse side effect).  Our pharmacist is trying to get this approved with insurance. Twice now, awaiting result  She is on amikacin, azith, clofazimine, linezolid now along with doxy (as requested by the insurance).  Cefoxitin stopped due to rash thought to be related to it.  She is tolerating  it  Follow up with me in 4-6 weeks  I will ask dr Tonia Brooms about repeating a bronch in 3-6 months to get sputum again as she can't get good sputum by herself. I am not confident I can trust her expectorated sputum for sterility check  Will plan on repeat chest ct in another 3 months and see if dr Tonia Brooms could help get some sputum in 3-6 months  02/08/22 id assessment Patient ready to start abx again. Couldn't tolerate tigecycline Iv omadocycline is covered by her insurance. They had several times unreasonably and illegally denied oral omadocycline  Will resume amikacin and omadocycline iv, with oral azithromycin and clofazimine  Plan 3 months iv then transition to oral hopefully by that time her sputum has sterilization of m-abscessus (so around 05/2022)  03/16/22 id assessment Patient to arrange body ct with her oncologist this December She'll be at 3 months of current regimen for m-abscessus by 05/2022 which I think she should have sputum assessment again I'll ask dr Tonia Brooms to do bronchoscopy to get afb sputum culture at that time (around end of December or early 05/2022 F/u with me after 4-6 weeks after bronchoscopy done so we can decide if we need to drop one of the iv antibiotics   06/16/22 id assessment Doing well Ct chest would say fairly stable 05/2022 bronch sample afb culture negative She can't get sputum so will have to repeat bronch in 08/2022 and need at least a year of negative sample before we can stop all antibiotics  For now we can stop amikacin Will continue iv omado, oral clofaz and azithromycin  Will check with our pharmacy team to see if transitioning from iv to PO omadocycline an option (insurance issue). And will also see if inhaled amikacin liposomal an option  Will let dr icard know to bronch er in 08/2022 Chest ct in 4-6 weeks  See me 4 weeks after bronchoscopy   09/14/22 id assessment We are about due  for repeat sputum afb culture which she'll need bronchoscopy  due to inability to get expectorated sputum. She'll follow up with dr Tonia Brooms to get this done by June 2024 Right hip arthroplasty 3/15 with still not fully healed incision -- not sure if there is concern for hardware take issue or infection; I asked patient to get record and any recent swab testing done of the wound Continue current 3 antibiotics clofaz/azith/omadocycline Routine blood test for abx monitoring F/u with me 2-4 weeks after egd is done  Sign of eustachian tube dysfunction pending ent evaluation. Will send chart to our pharmacy team too to inquire about abx side effect but I do not think that's the case   Chart to be sent to dr Icard asking for bronchoscopy/afb cx by 10/2022   ----------- Addendum 09/15/22 received records from ortho 4/24 swab cx of the wound opening on the right hip --> no organisms seen; rare wbc; cx negative 4/24 sed rate 11; crp < 3 (<8)    11/24/22 id clinic assessment She went off azithromycin during may visit with me and continued on omadocycline/clofazimine End of 08/2022 linezolid added but she only took 2 weeks as she felt nauseous.  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  Her hip surgery had recovered.   Her hearing hasn't changed since stopping azithromycin    At this time, oral options to make it a 3 drug regimen seems limited. She doesn't want to add back iv antibiotics. I do not want to start her on arikayce in the setting of hearing loss  She mentions quality of life is more important   I would wait before adding any other antibiotics at this time, pending the bronchoscopy which is in early 12/2022  I will also refer her to [facility] to have second opinion on treatment plan   I will see her early 01/2023  I would also want to repeat  sputum/chest ct early next year   Follow-up: No follow-ups on file.  Raymondo Band, MD [facility] 11/24/2022, 4:10 PM

## 2022-11-25 ENCOUNTER — Inpatient Hospital Stay: Payer: Commercial Managed Care - PPO | Admitting: Hematology

## 2022-11-25 ENCOUNTER — Encounter: Payer: Self-pay | Admitting: Hematology

## 2022-11-25 DIAGNOSIS — C187 Malignant neoplasm of sigmoid colon: Secondary | ICD-10-CM

## 2022-11-25 DIAGNOSIS — Z170 Estrogen receptor positive status [ER+]: Secondary | ICD-10-CM

## 2022-11-25 DIAGNOSIS — D500 Iron deficiency anemia secondary to blood loss (chronic): Secondary | ICD-10-CM

## 2022-11-25 DIAGNOSIS — C50511 Malignant neoplasm of lower-outer quadrant of right female breast: Secondary | ICD-10-CM

## 2022-11-25 NOTE — Assessment & Plan Note (Signed)
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 S/p B/l mastectomies by Dr Luisa Hart and adjuvant radiation by Dr Mitzi Hansen -She started on Anastrozole in 04/2018, switched to exemestane in 08/2020 due to concerns with joint pain. Ultimately discontinued 07/2021 due to worsening joint pain from osteoarthritis.  -We previously discussed switching to tamoxifen, patient declined due to concern for side effects.  Her arthralgia did not improve after she came off exemestane, so she restarted exemestane in 01/2022. She declined Tamoxifen  -She will complete 5 years of aromatase inhibitor in July 2025, I discussed the benefit of extended adjuvant AI.  I recommend breast cancer index test to see if she would benefit from extended AI.  She agrees.

## 2022-11-25 NOTE — Progress Notes (Signed)
[facility]   Telephone:([phone] Fax:(336) [phone]   Clinic Follow up Note   Patient Care Team: [name] Palau, FNP as PCP - General (Nurse Practitioner) Harriette Bouillon, MD as Consulting Physician (General Surgery) Magrinat, Valentino Hue, MD (Inactive) as Consulting Physician (Oncology) Dorothy Puffer, MD as Consulting Physician (Radiation Oncology) Albin Fischer, RN as Registered Nurse Farris Has, MD as Referring Physician (Dermatology) Radonna Ricker, RN (Inactive) as Oncology Nurse Navigator Malachy Mood, MD as Consulting Physician (Hematology) Andria Meuse, MD as Consulting Physician (General Surgery) Malachy Mood, MD as Consulting Physician (Hematology) Barnett Abu, MD as Consulting Physician (Neurosurgery)  Date of Service:  11/25/2022  I connected with Roosevelt Locks on 11/25/2022 at  2:40 PM EDT by telephone visit and verified that I am speaking with the correct person using two identifiers.  I discussed the limitations, risks, security and privacy concerns of performing an evaluation and management service by telephone and the availability of in person appointments. I also discussed with the patient that there may be a patient responsible charge related to this service. The patient expressed understanding and agreed to proceed.   Other persons participating in the visit and their role in the encounter:  Sharlette Dense CMA  Patient's location:  home Provider's location:  In office  CHIEF COMPLAINT: f/u of colon cancer, h/o breast cancer      CURRENT THERAPY:  Surveillance  ASSESSMENT & PLAN:  Sonia Sandoval is a 72 y.o. female with   Malignant neoplasm of lower-outer quadrant of right breast of female, estrogen receptor positive (HCC) -Stage IIA,pT2N1M0, ER+/PR+/HER2-, G3 -diagnosed in 09/2017. S/p B/l mastectomies by Dr Luisa Hart and adjuvant radiation by Dr Mitzi Hansen -She started on Anastrozole in 04/2018, switched to exemestane in 08/2020 due  to concerns with joint pain. Ultimately discontinued 07/2021 due to worsening joint pain from osteoarthritis.  -We previously discussed switching to tamoxifen, patient declined due to concern for side effects.  Her arthralgia did not improve after she came off exemestane, so she restarted exemestane in 01/2022. She declined Tamoxifen  -She will complete 5 years of aromatase inhibitor in July 2025, I discussed the benefit of extended adjuvant AI.  I recommend breast cancer index test to see if she would benefit from extended AI.  She agrees.  Cancer of sigmoid colon (HCC) pT3N0M0, stage II -diagnosed in 08/2019 by colonoscopy for anemia. 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 Margins and lymph nodes negative. -Adjuvant chemotherapy was not recommended -GuardianReveal in 04/2020 was negative. Repeat not performed due to insurance. 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  -she is 3 years out from initial diagnosis, risk of recurrence is low now, I do not plan to repeat surveillance CTs. -Continue lab and follow-up for additional 2 years.  Iron deficiency anemia due to chronic blood loss -She had a history of iron deficient anemia in the past -She developed a significant anemia after her hip surgery 3 months ago, required 1 unit of blood transfusion. -Anemia workup from November 21, 2022 showed significantly low ferritin level, normal B12 and folate, low reticulocyte count, normal haptoglobin, no evidence of hemolysis.  This is consistent with iron deficient anemia. -Her anemia has improved, but not consistent, she has been on oral iron.  I recommend IV iron, but the patient has poor IV access, and would like to try oral iron first.  She  will take over-the-counter ferric sulfate 2 tablets a day for a month, then changed to 1 tablet daily. -Monitor her CBC and iron  level closely   PLAN: - reviewed labs - recommend otc oral iron - take iron two times a day for month then once a day after - increase iron rich food - f/u labs in 2 months - recommend endoscopy     SUMMARY OF ONCOLOGIC HISTORY: Oncology History Overview Note  Cancer Staging Cancer of sigmoid colon [facility]) Staging form: Colon and Rectum, AJCC 8th Edition - Pathologic stage from 11/22/2019: Stage IIA (pT3, pN0, cM0) - Signed by Malachy Mood, MD on 12/17/2019  Malignant neoplasm of lower-outer quadrant of right breast of female, estrogen receptor positive (HCC) Staging form: Breast, AJCC 8th Edition - Clinical: Stage IIB (cT2, cN1, cM0, G3, ER+, PR+, HER2-) - Unsigned - Pathologic: Stage IIA (pT2, pN1(sn), cM0, G3, ER+, PR+, HER2-) - Unsigned    Malignant neoplasm of lower-outer quadrant of right breast of female, estrogen receptor positive (HCC)  10/23/2017 Initial Diagnosis    right breast lower outer quadrant biopsy 10/23/2017 for a clinical T2 N1, stage IIB invasive ductal carcinoma, grade 3, estrogen and progesterone receptor positive, HER-2 not amplified, with an MIB-1 of 20%   11/07/2017 Miscellaneous   Mammaprint on was read as low risk, predicting no significant chemotherapy benefit with a 5-year distant disease-free survival in the 97-98% range with hormone therapy alone   12/05/2017 Surgery   status post bilateral mastectomies on 12/05/2017 showing             (a) on the left, no evidence of malignancy             (b) on the right, a pT2 pN1, stage IIB invasive ductal carcinoma, grade 3, with negative margins. By Dr Luisa Hart   01/15/2018 - 02/28/2018 Radiation Therapy   adjuvant radiation 01/15/2018 - 02/28/2018 with Dr Mitzi Hansen              Site/dose: The patient initially received a dose of 50.4 Gy in 28 fractions to the chest wall and supraclavicular region. This was delivered using a 3-D conformal, 4 field technique. The patient then received a boost to the mastectomy scar. This  delivered an additional 10 Gy in 5 fractions using an en face electron field. The total dose was 60.4 Gy.    04/01/2018 -  Anti-estrogen oral therapy   started anastrozole in 04/2018. Switched to Exemestane in 08/2020 due to joint pain.             (a) bone density 03/07/2018 with a T score of -1.8. Her 04/21/20 DEXA showed osteopenia -1.8 at right total femur, with low risk for fracture.   10/28/2019 Genetic Testing   Negative genetic testing.  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  The Common Hereditary Gene Panel offered by [facility] includes sequencing and/or deletion duplication testing of the following 48 genes: APC, ATM, AXIN2, BARD1, BMPR1A, BRCA1, BRCA2, BRIP1, CDH1, CDK4, CDKN2A (p14ARF), CDKN2A (p16INK4a), CHEK2, CTNNA1, DICER1, EPCAM (Deletion/duplication testing only), GREM1 (promoter region deletion/duplication testing only), KIT, MEN1, MLH1, MSH2, MSH3, MSH6, MUTYH, NBN, NF1, NHTL1, PALB2, PDGFRA, PMS2, POLD1, POLE, PTEN, RAD50, RAD51C, RAD51D, RNF43, SDHB, SDHC, SDHD, SMAD4, SMARCA4. STK11, TP53, TSC1, TSC2, and VHL.  The following genes were evaluated for sequence changes only: SDHA and HOXB13 c.251G>A variant only. The report date is October 28, 2019.   03/08/2021 Imaging   CT Chest w/o contrast  IMPRESSION: 1. The  previous FDG avid lung mass within the anterior right lower lobe has decreased in size compared with previous exam compatible with response to therapy. The cavitary lung nodule within the right apex is slightly increased in size from previous exam. Additionally, there is a new irregular nodule within the medial right upper lobe measuring 6 mm. Previously 3 mm. 2. Interval increase in size of cavitary nodule within the right lung apex. This measures 2.3 x 1.5 cm, compared with 1.3 x 0.9 cm previously. Underlying malignancy cannot be excluded. 3. New small nodule within the right upper lobe measures 6 mm. This has a nonspecific appearance but  warrants attention on follow-up imaging. 4. The remaining small pulmonary nodules noted previously are stable in the interval. 5. Coronary artery calcifications noted. 6. Aortic Atherosclerosis (ICD10-I70.0).   Cancer of sigmoid colon (HCC)  09/23/2019 Tumor Marker   Baseline CEA 1.4 on 09/23/19    09/23/2019 Procedure   Colonoscopy and Upper endoscopy with Dr Levora Angel 09/23/19  Upper Endoscopy Impression - Z-line regular, 35 cm from the incisors. - Small hiatal hernia. - Non-bleeding gastric ulcer. Biopsied. - Hematin (altered blood/coffee-ground-like material) in the gastric body. - Normal duodenal bulb, first portion of the duodenum and second portion of - Normal duodenal bulb, first portion of the duodenum and second portion of the duodenum.  Colonoscopy Impression - Preparation of the colon was poor. - Perianal skin tags found on perianal exam. - Likely malignant tumor in the recto-sigmoid colon. Biopsied. Tattooed. - Internal hemorrhoids.   09/23/2019 Initial Biopsy   FINAL MICROSCOPIC DIAGNOSIS: 09/23/19 A. STOMACH, ULCER, BIOPSY:  - Gastric antral mucosa with mild nonspecific reactive gastropathy  - Warthin Starry stain is negative for Helicobacter pylori   B. COLON, RECTOSIGMOID, MASS, BIOPSY:  - Adenocarcinoma, see comment  COMMENT:   B.  Immunohistochemical stains for MMR-related proteins are pending and  will be reported in an addendum. Dr. Luisa Hart reviewed the case and  concurs with the diagnosis. Dr. Levora Angel was notified on 09/24/2019.     ADDENDUM:  Mismatch Repair Protein (IHC)  SUMMARY INTERPRETATION: NORMAL  There is preserved expression of the major MMR proteins. There is a very  low probability that microsatellite instability (MSI) is present.  However, certain clinically significant MMR protein mutations may result  in preservation of nuclear expression. It is recommended that the  preservation of protein expression be correlated with molecular  based  MSI testing.   IHC EXPRESSION RESULTS  TEST           RESULT  MLH1:          Preserved nuclear expression  MSH2:          Preserved nuclear expression  MSH6:          Preserved nuclear expression  PMS2:          Preserved nuclear expression    09/23/2019 Imaging   CT CAP W Contrast 09/23/19  IMPRESSION: 1. There is eccentric wall thickening of the rectosigmoid colon, in keeping with mass identified and biopsied by colonoscopy.   2. There are prominent subcentimeter lymph nodes in the sigmoid mesocolon, suspicious for nodal metastatic disease. There is no other lymphadenopathy in the abdomen or pelvis.   3. There are multiple small bilateral pulmonary nodules measuring up to 4 mm, nonspecific although very suspicious for pulmonary metastatic disease. Given history of breast malignancy, previous imaging of the chest would be very helpful to assess for stability. This report may be addended for comparison if prior  imaging can be obtained.   4. Status post bilateral mastectomy with irregular subpleural opacity of the anterior right upper lobe, consistent with history of breast malignancy and radiation fibrosis.   5. There is adjacent nodularity of the right pulmonary apex, the largest discrete nodular component measuring up to 1.1 cm. This is more favored infectious or inflammatory although nonspecific and metastatic disease is not excluded. As as above, prior imaging would be very helpful to assess.   6. Small focus of nonspecific infectious or inflammatory airspace disease of the perihilar left upper lobe.   09/26/2019 Initial Diagnosis   Cancer of sigmoid colon (HCC)   10/28/2019 PET scan   IMPRESSION: 1. Increased tracer uptake within the pelvis corresponding to wall thickening of the rectosigmoid colon favored to represent primary colorectal carcinoma. No findings of FDG avid nodal metastasis or solid organ metastasis within the abdomen or pelvis. 2. Within the  right lung apex there is a cluster of 3 subpleural nodular densities which exhibit varying degrees of mild FDG uptake with SUV max between 1.9 and 3.1. Morphologically the appearance is suggestive of benign postinflammatory pleuroparenchymal scarring. This would be an atypical pattern of metastatic disease, which is considered less favored, but not excluded. Attention in this area on serial follow-up imaging is advised. 3. Two small, less than 5 mm left lower lobe lung nodules are too small to reliably characterize. 4.  Aortic Atherosclerosis (ICD10-I70.0). 5. Nonobstructing right renal calculus.     11/22/2019 Cancer Staging   Staging form: Colon and Rectum, AJCC 8th Edition - Pathologic stage from 11/22/2019: Stage IIA (pT3, pN0, cM0) - Signed by Malachy Mood, MD on 12/17/2019   11/22/2019 Surgery   XI ROBOT ASSISTED SIGMOIDECTOMY and FLEXIBLE SIGMOIDOSCOPY by Dr white and Dr Maisie Fus   11/22/2019 Pathology Results   FINAL MICROSCOPIC DIAGNOSIS:   A. SIGMOID COLON, RESECTION:  - Invasive moderately differentiated adenocarcinoma, 4.5 cm, involving distal sigmoid colon  - Carcinoma focally invades into pericolonic soft tissue  - Resection margins are negative for carcinoma  - Negative for lymphovascular or perineural invasion  - Eighteen benign lymph nodes (0/18)  - See oncology table   B. FINAL DISTAL MARGIN, EXCISION:  - Colonic donut, negative for carcinoma    04/15/2020 Imaging   CT Chest  IMPRESSION: 1. New nodular 1.9 cm focus of consolidation in the anterior right lower lobe with surrounding mild patchy tree-in-bud opacity, nonspecific but favoring infectious/inflammatory etiology. 2. Clustered nodularity in the apical right upper lobe is stable in size and newly cavitary. These findings are indeterminate for atypical infection such as MAI versus response to therapy of pulmonary metastases. Additional scattered tiny left pulmonary nodules are stable. Suggest close chest CT  follow-up in 3 months. 3. No thoracic adenopathy. 4. Stable dilated main pulmonary artery, suggesting chronic pulmonary arterial hypertension. 5. Aortic Atherosclerosis (ICD10-I70.0).   04/17/2020 Tumor Marker   Her Guardant Reveal showed negative - ctDNA not detected.    07/08/2020 Imaging   CT Chest  IMPRESSION: Slight reduction in size of hypermetabolic right upper lobe pulmonary nodule, nonspecific though favored to represent area of infection/inflammation as was initially suggested on chest CT performed 04/15/2020. No biopsy attempted at this time.   03/08/2021 Imaging   CT Chest w/o contrast  IMPRESSION: 1. The previous FDG avid lung mass within the anterior right lower lobe has decreased in size compared with previous exam compatible with response to therapy. The cavitary lung nodule within the right apex is slightly increased in size from  previous exam. Additionally, there is a new irregular nodule within the medial right upper lobe measuring 6 mm. Previously 3 mm. 2. Interval increase in size of cavitary nodule within the right lung apex. This measures 2.3 x 1.5 cm, compared with 1.3 x 0.9 cm previously. Underlying malignancy cannot be excluded. 3. New small nodule within the right upper lobe measures 6 mm. This has a nonspecific appearance but warrants attention on follow-up imaging. 4. The remaining small pulmonary nodules noted previously are stable in the interval. 5. Coronary artery calcifications noted. 6. Aortic Atherosclerosis (ICD10-I70.0).   04/04/2022 Imaging    IMPRESSION: 1. Cavitary right apical lesion has become confluent in the interval, measuring minimally smaller at 3.1 x 2.1 cm compared to 3.2 x 2.8 cm previously. 2. Stable left lower lobe pulmonary nodules measuring up to 6 mm. 3. Enlargement of the pulmonary outflow tract/main pulmonary arteries suggests pulmonary arterial hypertension. 4.  Aortic Atherosclerosis (ICD10-I70.0).   10/21/2022 Imaging     IMPRESSION: No evidence of recurrent or metastatic carcinoma within the abdomen or pelvis.   Tiny right renal calculus. No evidence of ureteral calculi or hydronephrosis.   Large stool burden noted; recommend correlation for symptoms or signs of constipation.   Stable sub-cm left lower lobe pulmonary nodules. Recommend continued attention on follow-up imaging.        INTERVAL HISTORY:  Sonia Sandoval was contacted for a follow up of Colon Cancer. She was last seen by me on 11/04/2022. She presents to clinic via phone visit. Patient reports feeling better.   All other systems were reviewed with the patient and are negative.  MEDICAL HISTORY:  Past Medical History:  Diagnosis Date   Allergies 1995   Per patient report 11/14/18   Anemia    prior to finding colon cancer, no longer any issues with this per patient   Anxiety    Uses for sleep and long car rides   Arthritis    Breast cancer (HCC)    Breast cancer, right breast (HCC) 09/2017   S/P mastectomy 12/05/2017   Colon cancer (HCC)    COPD (chronic obstructive pulmonary disease) (HCC)    pt reports previous MD diagnosed her with this, but takes no medications   Dyspnea    occasionally with exertion   Family history of breast cancer    Headache 1968   per patient report, started as a teenager   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   Mycobacterium infection    lung   Osteopenia 03/2018   Seen on DEXA Scan   PONV (postoperative nausea and vomiting)     SURGICAL HISTORY: Past Surgical History:  Procedure Laterality Date   BACK SURGERY  2003   Patient will occassional back pain since 2003   BIOPSY  09/23/2019   Procedure: BIOPSY;  Surgeon: Kathi Der, MD;  Location: [facility];  Service: Gastroenterology;;  EGD and COLON   BREAST BIOPSY Right 09/2017   BRONCHIAL BIOPSY  08/31/2021   Procedure:  BRONCHIAL BIOPSIES;  Surgeon: Josephine Igo, DO;  Location: [facility];  Service: Pulmonary;;   BRONCHIAL BIOPSY  05/17/2022   Procedure: BRONCHIAL BIOPSIES;  Surgeon: Josephine Igo, DO;  Location: [facility];  Service: Pulmonary;;   BRONCHIAL BRUSHINGS  08/31/2021   Procedure: BRONCHIAL BRUSHINGS;  Surgeon: Josephine Igo, DO;  Location: [facility];  Service: Pulmonary;;   BRONCHIAL  NEEDLE ASPIRATION BIOPSY  08/31/2021   Procedure: BRONCHIAL NEEDLE ASPIRATION BIOPSIES;  Surgeon: Josephine Igo, DO;  Location: [facility];  Service: Pulmonary;;   BRONCHIAL WASHINGS  08/31/2021   Procedure: BRONCHIAL WASHINGS;  Surgeon: Josephine Igo, DO;  Location: [facility];  Service: Pulmonary;;   BRONCHIAL WASHINGS  05/17/2022   Procedure: BRONCHIAL WASHINGS;  Surgeon: Josephine Igo, DO;  Location: [facility];  Service: Pulmonary;;   CESAREAN SECTION  1985; 1992   COLONOSCOPY WITH PROPOFOL N/A 09/23/2019   Procedure: COLONOSCOPY WITH PROPOFOL and EGD;  Surgeon: Kathi Der, MD;  Location: [facility];  Service: Gastroenterology;  Laterality: N/A;   DILATION AND CURETTAGE OF UTERUS     ESOPHAGOGASTRODUODENOSCOPY N/A 09/23/2019   Procedure: ESOPHAGOGASTRODUODENOSCOPY (EGD);  Surgeon: Kathi Der, MD;  Location: [facility];  Service: Gastroenterology;  Laterality: N/A;   FLEXIBLE SIGMOIDOSCOPY N/A 11/22/2019   Procedure: FLEXIBLE SIGMOIDOSCOPY;  Surgeon: Andria Meuse, MD;  Location: [facility];  Service: General;  Laterality: N/A;   LUMBAR DISC SURGERY  2003   MASTECTOMY Left 12/05/2017   PROPHYLACTIC MASTECTOMY   MASTECTOMY COMPLETE / SIMPLE W/ SENTINEL NODE BIOPSY Right 12/05/2017   WITH RADIOACTIVE SEED TARGETED RIGHT AXIILARY LYMPH NODE EXCISION AND RIGHT SENTINEL LYMPH NODE BIOPSY,    MASTECTOMY WITH RADIOACTIVE SEED GUIDED EXCISION AND AXILLARY SENTINEL LYMPH NODE BIOPSY Bilateral 12/05/2017   Procedure: RIGHT SIMPLE MASTECTOMY WITH RADIOACTIVE SEED TARGETED RIGHT  AXIILARY LYMPH NODE EXCISION AND RIGHT SENTINEL LYMPH NODE BIOPSY, LEFT PROPHYLACTIC MASTECTOMY;  Surgeon: Harriette Bouillon, MD;  Location: [facility];  Service: General;  Laterality: Bilateral;   SUBMUCOSAL TATTOO INJECTION  09/23/2019   Procedure: SUBMUCOSAL TATTOO INJECTION;  Surgeon: Kathi Der, MD;  Location: [facility];  Service: Gastroenterology;;   TOTAL HIP ARTHROPLASTY Right 07/12/2022   Procedure: RIGHT TOTAL HIP ARTHROPLASTY ANTERIOR APPROACH;  Surgeon: Ernestina Columbia, MD;  Location: [facility];  Service: Orthopedics;  Laterality: Right;   VIDEO BRONCHOSCOPY WITH RADIAL ENDOBRONCHIAL ULTRASOUND  08/31/2021   Procedure: RADIAL ENDOBRONCHIAL ULTRASOUND;  Surgeon: Josephine Igo, DO;  Location: [facility];  Service: Pulmonary;;    I have reviewed the social history and family history with the patient and they are unchanged from previous note.  ALLERGIES:  is allergic to tigecycline, codeine, and heparin.  MEDICATIONS:  Current Outpatient Medications  Medication Sig Dispense Refill   albuterol (VENTOLIN HFA) 108 (90 Base) MCG/ACT inhaler TAKE 2 PUFFS BY MOUTH EVERY 6 HOURS AS NEEDED FOR WHEEZE OR SHORTNESS OF BREATH 18 each 2   ALPRAZolam (NIRAVAM) 0.5 MG dissolvable tablet Take 0.5 mg by mouth daily as needed (Sleep/Travel).     Calcium Carbonate-Vitamin D (CALCIUM 600+D PO) Take 600 mg by mouth daily.     celecoxib (CELEBREX) 100 MG capsule Take 1 capsule (100 mg total) by mouth 2 (two) times daily. 60 capsule 2   Cholecalciferol (VITAMIN D) 50 MCG (2000 UT) tablet Take 2,000 Units by mouth daily.     CLOFAZIMINE PO Take 100 mg by mouth daily.     cyanocobalamin (VITAMIN B12) 1000 MCG tablet Take 1,000 mcg by mouth daily. Chew     exemestane (AROMASIN) 25 MG tablet TAKE 1 TABLET (25 MG TOTAL) BY MOUTH DAILY AFTER BREAKFAST. 90 tablet 1   gabapentin (NEURONTIN) 300 MG capsule Take 300 mg by mouth 2 (two) times daily.     Omadacycline Tosylate 150 MG TABS Take 2 tablets (300 mg total)  by mouth daily. Fast for 4 hours before taking and 2 hours after  taking. 60 tablet 5   OVER THE COUNTER MEDICATION Take 1 tablet by mouth daily. Juice plus fruit blend     OVER THE COUNTER MEDICATION Take 1 capsule by mouth daily. Juice plus berry blend     OVER THE COUNTER MEDICATION Take 1 capsule by mouth daily. Juice plus veggie blend     Probiotic Product (PROBIOTIC PO) Take 2 capsules by mouth daily. 800 Million CFU     vitamin E 180 MG (400 UNITS) capsule Take 400 Units by mouth daily.     No current facility-administered medications for this visit.    PHYSICAL EXAMINATION: ECOG PERFORMANCE STATUS: 1 - Symptomatic but completely ambulatory  There were no vitals filed for this visit. Wt Readings from Last 3 Encounters:  11/24/22 145 lb (65.8 kg)  11/17/22 144 lb 12.8 oz (65.7 kg)  09/14/22 146 lb (66.2 kg)     No vitals taken today, Exam not performed today  LABORATORY DATA:  I have reviewed the data as listed    Latest Ref Rng & Units 11/21/2022   11:23 AM 10/21/2022   11:24 AM 09/14/2022    3:55 AM  CBC  WBC 4.0 - 10.5 K/uL 3.1  2.8  3.4   Hemoglobin 12.0 - 15.0 g/dL 9.6  8.0  8.5   Hematocrit 34.0 - 46.6 % 36.0 - 46.0 % 31.2    30.8  25.3  28.1   Platelets 150 - 400 K/uL 290  249  265         Latest Ref Rng & Units 11/21/2022   11:23 AM 10/21/2022   11:24 AM 09/14/2022    3:55 AM  CMP  Glucose 70 - 99 mg/dL 88  161  90   BUN 8 - 23 mg/dL 16  14  21    Creatinine 0.44 - 1.00 mg/dL 0.96  0.45  4.09   Sodium 135 - 145 mmol/L 139  141  141   Potassium 3.5 - 5.1 mmol/L 4.4  4.1  4.2   Chloride 98 - 111 mmol/L 109  111  108   CO2 22 - 32 mmol/L 24  25  24    Calcium 8.9 - 10.3 mg/dL 9.4  8.8  9.1   Total Protein 6.5 - 8.1 g/dL 6.1  5.5  6.0   Total Bilirubin 0.3 - 1.2 mg/dL 0.4  0.3  0.3   Alkaline Phos 38 - 126 U/L 76  84    AST 15 - 41 U/L 23  29  31    ALT 0 - 44 U/L 20  38  30       RADIOGRAPHIC STUDIES: I have personally reviewed the radiological images  as listed and agreed with the findings in the report. No results found.    No orders of the defined types were placed in this encounter.  All questions were answered. The patient knows to call the clinic with any problems, questions or concerns. No barriers to learning was detected. The total time spent in the appointment was 15 minutes.     Malachy Mood, MD 11/25/2022   I, Sharlette Dense, am acting as scribe for Malachy Mood, MD.   I have reviewed the above documentation for accuracy and completeness, and I agree with the above.

## 2022-11-25 NOTE — Assessment & Plan Note (Signed)
pT3N0M0, stage II -diagnosed in 08/2019 by colonoscopy for anemia. 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 Margins and lymph nodes negative. -Adjuvant chemotherapy was not recommended -GuardianReveal in 04/2020 was negative. Repeat not performed due to insurance. 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  -she is 3 years out from initial diagnosis, risk of recurrence is low now, I do not plan to repeat surveillance CTs. -Continue lab and follow-up for additional 2 years.

## 2022-11-25 NOTE — Assessment & Plan Note (Addendum)
-  She had a history of iron deficient anemia in the past -She developed a significant anemia after her hip surgery 3 months ago, required 1 unit of blood transfusion. -Anemia workup from November 21, 2022 showed significantly low ferritin level, normal B12 and folate, low reticulocyte count, normal haptoglobin, no evidence of hemolysis.  This is consistent with iron deficient anemia. -Her anemia has improved, but not consistent, she has been on oral iron.  I recommend IV iron, but the patient has poor IV access, and would like to try oral iron first.  She will take over-the-counter ferric sulfate 2 tablets a day for a month, then changed to 1 tablet daily. -Monitor her CBC and iron level closely

## 2022-11-27 IMAGING — CT CT CHEST-ABD-PELV W/ CM
2 of 6 series · 14 of 36 positions shown, 16 images · IV contrast (APPLIED)
Comparison: 03/08/2021

CLINICAL DATA: Colorectal cancer surveillance sigmoid colon cancer
status post resection, additional history of breast cancer status
post bilateral mastectomy * Tracking Code: BO *

EXAM:
CT CHEST, ABDOMEN, AND PELVIS WITH CONTRAST
TECHNIQUE: Multidetector CT imaging of the chest, abdomen and pelvis was
performed following the standard protocol during bolus
administration of intravenous contrast.

[Series 3: thins · axial · 0.98mm/px · z∈[-877,-296]mm · 11 of 924 slices shown, 13 images]
[im 47/924  mediastinal]
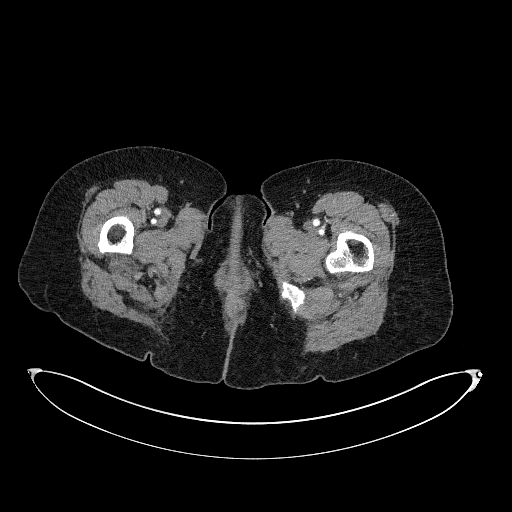
[im 47/924  bone]
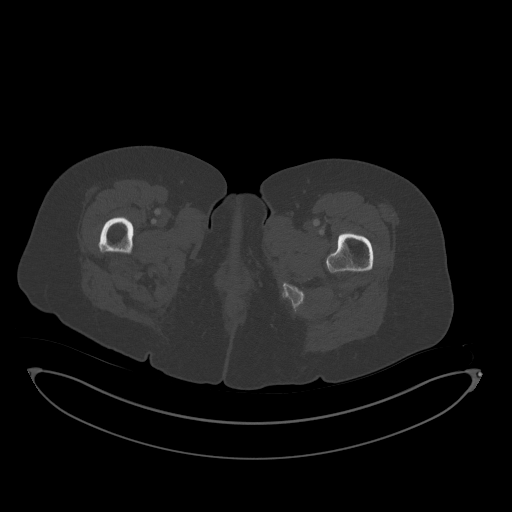
[im 139/924  mediastinal]
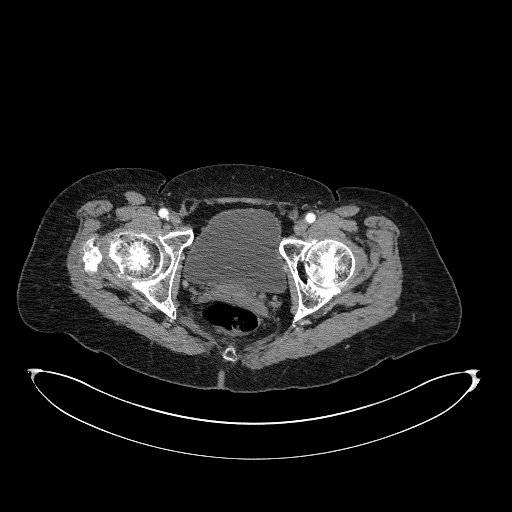
[im 231/924  mediastinal]
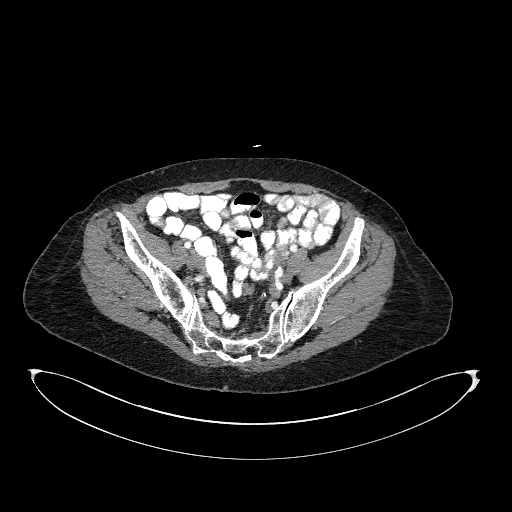
[im 324/924  mediastinal]
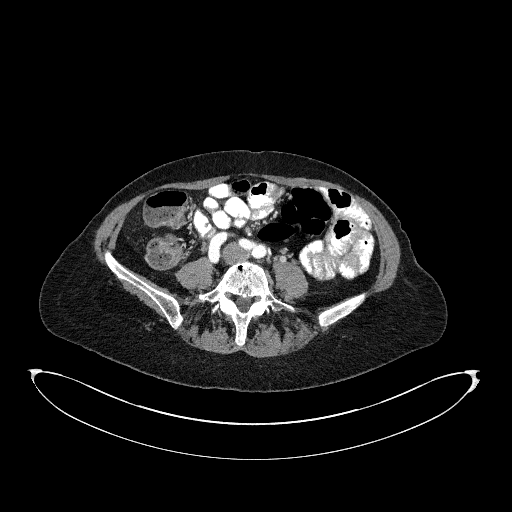
[im 370/924  mediastinal]
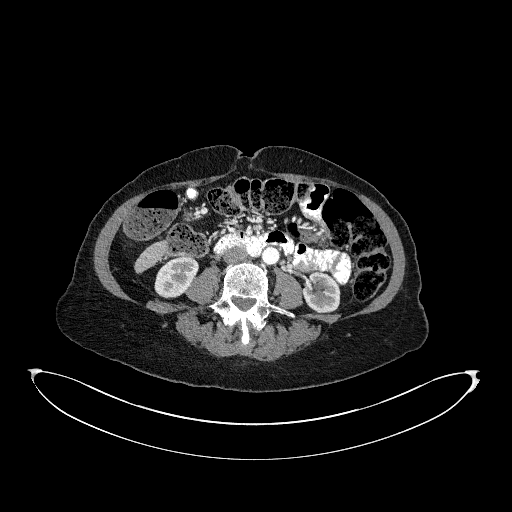
[im 462/924  mediastinal]
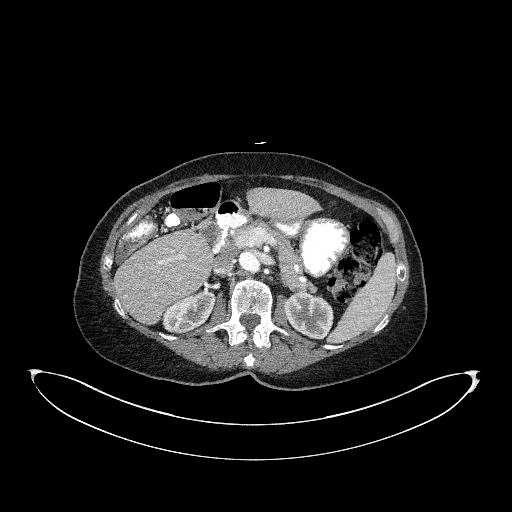
[im 554/924  mediastinal]
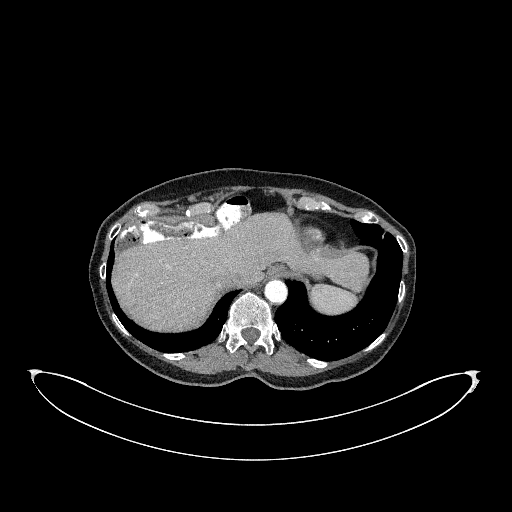
[im 600/924  mediastinal]
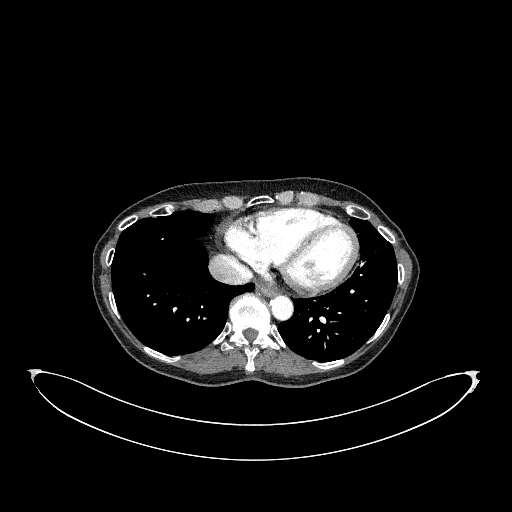
[im 693/924  mediastinal]
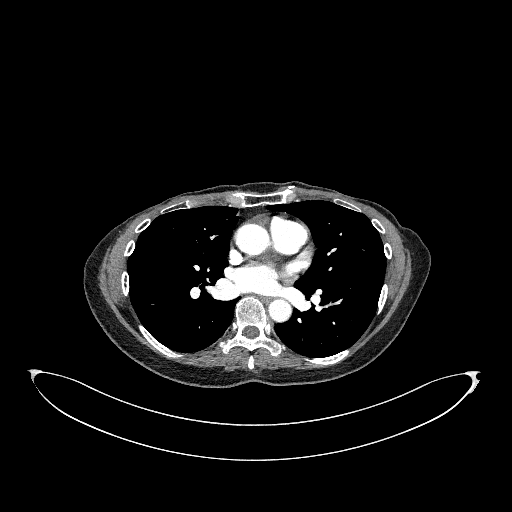
[im 693/924  bone]
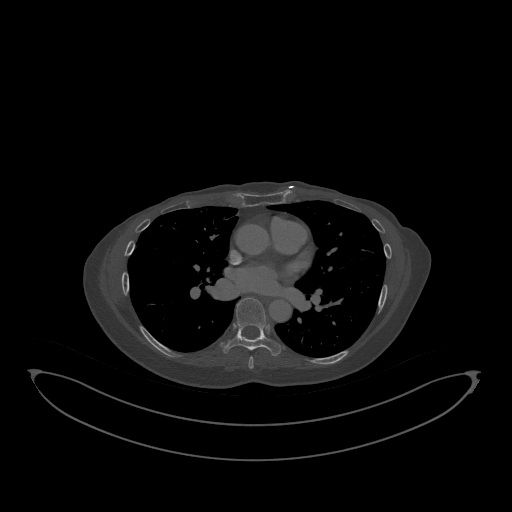
[im 785/924  mediastinal]
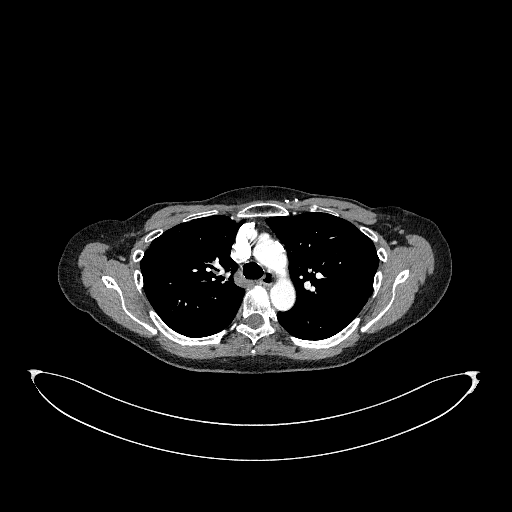
[im 877/924  mediastinal]
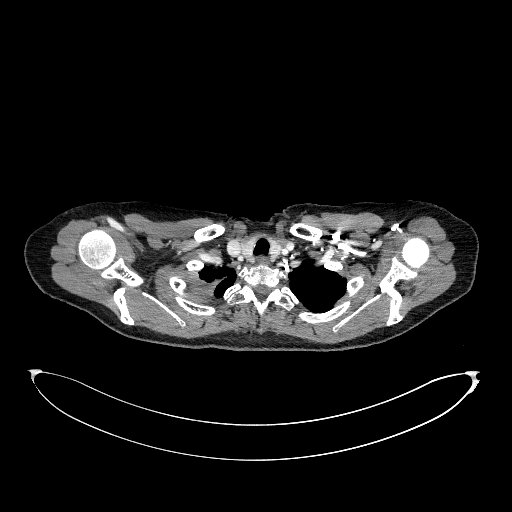

[Series 5: coronals · coronal · 0.93mm/px · 3 of 144 slices shown]
[im 29/144  mediastinal]
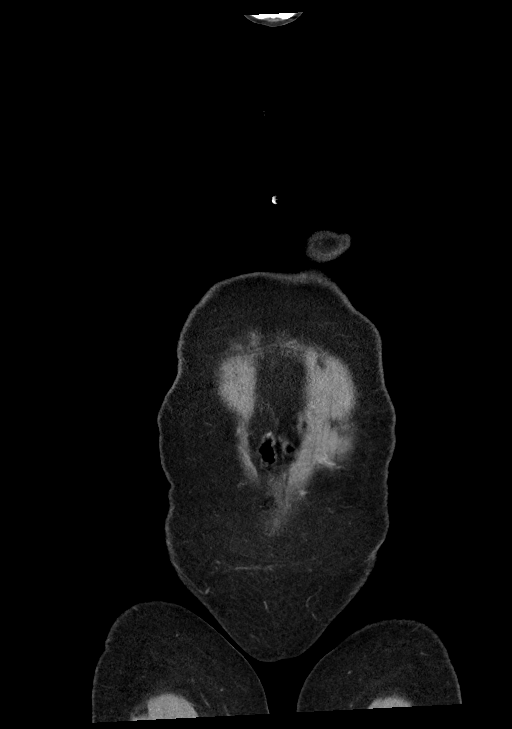
[im 58/144  mediastinal]
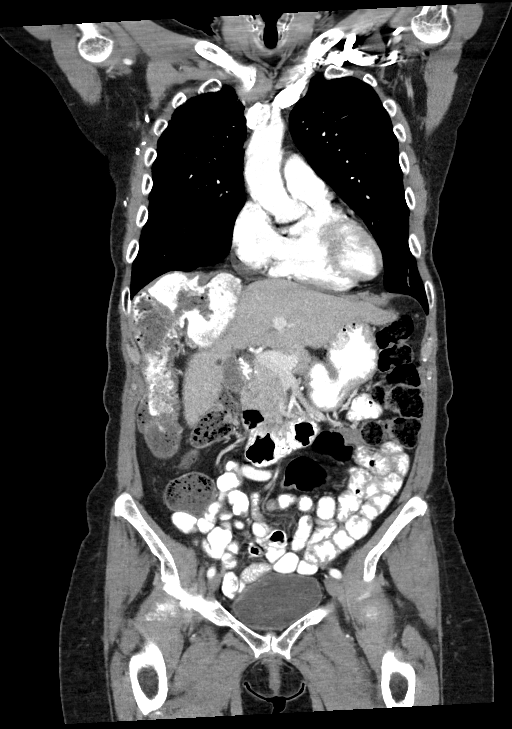
[im 86/144  mediastinal]
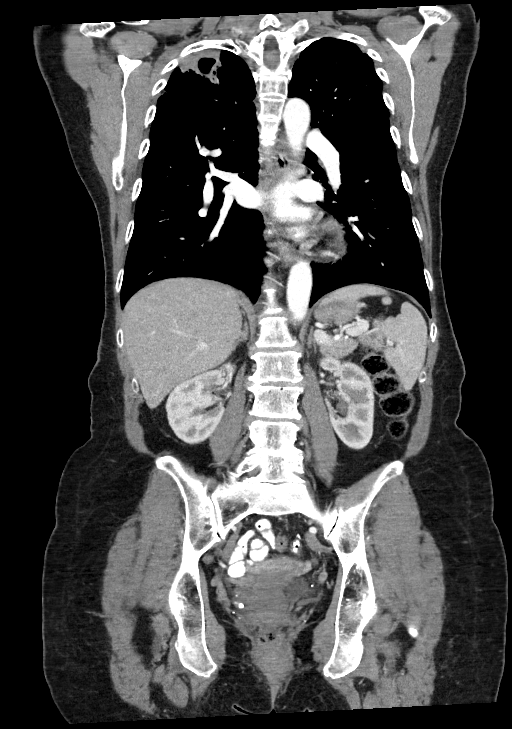

[14 of 36 positions shown; findings below may reference images not displayed]

RADIATION DOSE REDUCTION: This exam was performed according to the
departmental dose-optimization program which includes automated
exposure control, adjustment of the mA and/or kV according to
patient size and/or use of iterative reconstruction technique.

CONTRAST:  100mL OMNIPAQUE IOHEXOL 300 MG/ML SOLN, additional oral
enteric contrast
FINDINGS: CT CHEST FINDINGS

Cardiovascular: Aortic atherosclerosis. Normal heart size. No
pericardial effusion.

Mediastinum/Nodes: No enlarged mediastinal, hilar, or axillary lymph
nodes. Thyroid gland, trachea, and esophagus demonstrate no
significant findings.

Lungs/Pleura: Interval increase in wall thickening and cavitation
within the right pulmonary apex, overall lesion now confluent and
measuring 3.8 x 3.1 cm (series 4, image 24). Increased clustered
centrilobular nodularity and small consolidations in this vicinity
(series 4, image 37). Residual bandlike scarring in the bilateral
lung bases (series 4, image 109, 122). Occasional small nodules of
the left lower lobe measuring up to 0.5 cm (series 4, image 21). No
pleural effusion or pneumothorax.

Musculoskeletal: No chest wall mass or suspicious osseous lesions
identified. Status post bilateral mastectomy.

CT ABDOMEN PELVIS FINDINGS

Hepatobiliary: No solid liver abnormality is seen. No gallstones,
gallbladder wall thickening, or biliary dilatation.

Pancreas: Unremarkable. No pancreatic ductal dilatation or
surrounding inflammatory changes.

Spleen: Normal in size without significant abnormality.

Adrenals/Urinary Tract: Adrenal glands are unremarkable. Punctuate
nonobstructive calculus in the inferior pole of the right kidney
(series 5, image 89). No left-sided calculi, ureteral calculi, or
hydronephrosis. Bladder is unremarkable.

Stomach/Bowel: Stomach is within normal limits. Appendix is not
clearly visualized and may be surgically absent. The cecum is
anterior to the liver. Status post sigmoid colon resection and
reanastomosis. No evidence of bowel wall thickening, distention, or
inflammatory changes.

Vascular/Lymphatic: Aortic atherosclerosis. No enlarged abdominal or
pelvic lymph nodes.

Reproductive: No mass or other abnormality.

Other: No abdominal wall hernia or abnormality. No ascites.

Musculoskeletal: No acute osseous findings.
IMPRESSION: 1. Status post sigmoid colon resection and reanastomosis. No
evidence of lymphadenopathy or metastatic disease in the chest,
abdomen, or pelvis.
2. Interval increase in wall thickening and cavitation within the
right pulmonary apex, overall lesion now confluent and measuring
x 3.1 cm. Increased clustered centrilobular nodularity and small
consolidations in this vicinity. Findings are consistent with
ongoing, worsened cavitary infection.
3. Additional small nodules of the left lower lobe measuring up to
0.5 cm, unchanged and almost certainly benign, incidental sequelae
of infection inflammation.
4. Nonobstructive right nephrolithiasis.

Aortic Atherosclerosis ([id number]).

## 2022-11-28 ENCOUNTER — Telehealth: Payer: Self-pay | Admitting: Hematology

## 2022-12-01 ENCOUNTER — Telehealth: Payer: Self-pay

## 2022-12-01 NOTE — Telephone Encounter (Signed)
Since she just started the oral iron, I feel like you are correct. That is probably why the stools are black. I recommend she continue to monitor this and if it gets worse or there seems to be blood noted, she should let us know.

## 2022-12-01 NOTE — Telephone Encounter (Signed)
Pt called stating she's having black/dark stools.  Pt stated she had 3 dark stool on 11/30/2022 and had 1 today.  Pt is a Breast cancer pt who received IV Iron in the past.  Pt is not currently receiving chemotherapy or IV Iron.  Pt is taking AI therapy (Exemestane).  Pt is seeking advise regarding stools.  Notified Santiago Glad, NP and Vincent Gros, NP of pt's call.

## 2022-12-01 NOTE — Telephone Encounter (Signed)
Do you know if the patient is taking oral iron? She has a couple of over the counter medications listed, but it doesn't say what they are. With oral iron, that could cause the stool to be dark or even black.

## 2022-12-01 NOTE — Telephone Encounter (Signed)
Spoke with pt via telephone regarding previous call about dark stools.  Asked pt if she was taking PO Iron.  Pt stated 'Yes".  Pt stated she started taking Nature's Bounty PO iron 65mg  about 4 to 5 days ago.  Informed pt that PO Iron will cause dark stools.  Instructed pt to also take a stool softner daily d/t PO Iron can cause constipation.  Pt verbalized understanding and had no further questions or concerns at this time.

## 2022-12-02 ENCOUNTER — Encounter (HOSPITAL_COMMUNITY): Payer: Self-pay | Admitting: Pulmonary Disease

## 2022-12-02 ENCOUNTER — Other Ambulatory Visit: Payer: Self-pay

## 2022-12-02 NOTE — Progress Notes (Signed)
SDW call  Patient was given pre-op instructions over the phone. Patient verbalized understanding of instructions provided.   PCP - Ladora Daniel, PA-C Cardiologist - denies Pulmonary:    PPM/ICD - denies Device Orders - n/a Rep Notified - n/a   Chest x-ray - 07/12/2022 EKG -  8/9/20023 Stress Test -  ECHO -  Cardiac Cath -   Sleep Study/sleep apnea/CPAP: denies  Non-diabetic  Blood thinner instructions: denies Aspirin Instructions:denies   ERAS Protcol - No, NPO   COVID TEST- n/a    Anesthesia review: No   Patient denies shortness of breath, fever, cough and chest pain over the phone call  Your procedure is scheduled on Monday December 05, 2022  Report to [facility] Main Entrance "A" at  0630  A.M., then check in with the Admitting office.  Call this number if you have problems the morning of surgery:  [phone]   If you have any questions prior to your surgery date call [phone]: Open Monday-Friday 8am-4pm If you experience any cold or flu symptoms such as cough, fever, chills, shortness of breath, etc. between now and your scheduled surgery, please notify us at the above number    Remember:  Do not eat or drink after midnight the night before your surgery  Take these medicines the morning of surgery with A SIP OF WATER:  Patient states she does not take any medications the morning of her surgeries  As needed: albuterol  As of today, STOP taking any Aspirin (unless otherwise instructed by your surgeon) Aleve, Naproxen, Ibuprofen, Motrin, Advil, Goody's, BC's, all herbal medications, fish oil, and all vitamins. This includes your Celebrex

## 2022-12-05 ENCOUNTER — Ambulatory Visit (HOSPITAL_COMMUNITY)
Admission: RE | Admit: 2022-12-05 | Discharge: 2022-12-05 | Disposition: A | Discharge status: Home / Self Care | Payer: Commercial Managed Care - PPO | Attending: Pulmonary Disease | Admitting: Pulmonary Disease

## 2022-12-05 ENCOUNTER — Encounter (HOSPITAL_COMMUNITY): Payer: Self-pay | Admitting: Pulmonary Disease

## 2022-12-05 ENCOUNTER — Encounter (HOSPITAL_COMMUNITY)
Admission: RE | Disposition: A | Discharge status: Home / Self Care | Payer: Self-pay | Source: Home / Self Care | Attending: Pulmonary Disease

## 2022-12-05 ENCOUNTER — Ambulatory Visit (HOSPITAL_COMMUNITY): Payer: Commercial Managed Care - PPO | Admitting: Anesthesiology

## 2022-12-05 ENCOUNTER — Ambulatory Visit (HOSPITAL_COMMUNITY): Payer: Commercial Managed Care - PPO

## 2022-12-05 ENCOUNTER — Ambulatory Visit (HOSPITAL_BASED_OUTPATIENT_CLINIC_OR_DEPARTMENT_OTHER): Payer: Commercial Managed Care - PPO | Admitting: Anesthesiology

## 2022-12-05 ENCOUNTER — Other Ambulatory Visit: Payer: Self-pay

## 2022-12-05 DIAGNOSIS — J449 Chronic obstructive pulmonary disease, unspecified: Secondary | ICD-10-CM

## 2022-12-05 DIAGNOSIS — Z853 Personal history of malignant neoplasm of breast: Secondary | ICD-10-CM | POA: Insufficient documentation

## 2022-12-05 DIAGNOSIS — R911 Solitary pulmonary nodule: Secondary | ICD-10-CM | POA: Diagnosis not present

## 2022-12-05 DIAGNOSIS — A310 Pulmonary mycobacterial infection: Secondary | ICD-10-CM

## 2022-12-05 DIAGNOSIS — Z9889 Other specified postprocedural states: Secondary | ICD-10-CM | POA: Insufficient documentation

## 2022-12-05 DIAGNOSIS — Z7951 Long term (current) use of inhaled steroids: Secondary | ICD-10-CM | POA: Diagnosis not present

## 2022-12-05 DIAGNOSIS — F419 Anxiety disorder, unspecified: Secondary | ICD-10-CM | POA: Diagnosis not present

## 2022-12-05 DIAGNOSIS — M1990 Unspecified osteoarthritis, unspecified site: Secondary | ICD-10-CM | POA: Diagnosis not present

## 2022-12-05 DIAGNOSIS — I700 Atherosclerosis of aorta: Secondary | ICD-10-CM | POA: Insufficient documentation

## 2022-12-05 DIAGNOSIS — Z85038 Personal history of other malignant neoplasm of large intestine: Secondary | ICD-10-CM | POA: Diagnosis not present

## 2022-12-05 DIAGNOSIS — J984 Other disorders of lung: Secondary | ICD-10-CM | POA: Diagnosis not present

## 2022-12-05 DIAGNOSIS — A319 Mycobacterial infection, unspecified: Secondary | ICD-10-CM | POA: Diagnosis not present

## 2022-12-05 DIAGNOSIS — I2510 Atherosclerotic heart disease of native coronary artery without angina pectoris: Secondary | ICD-10-CM | POA: Diagnosis not present

## 2022-12-05 HISTORY — PX: BRONCHIAL WASHINGS: SHX5105

## 2022-12-05 HISTORY — PX: BRONCHIAL BIOPSY: SHX5109

## 2022-12-05 LAB — AEROBIC/ANAEROBIC CULTURE W GRAM STAIN (SURGICAL/DEEP WOUND)

## 2022-12-05 SURGERY — BRONCHOSCOPY, WITH BIOPSY USING ELECTROMAGNETIC NAVIGATION
Anesthesia: General | Laterality: Bilateral

## 2022-12-05 MED ORDER — PROMETHAZINE HCL 25 MG/ML IJ SOLN: 6.2500 mg | INTRAMUSCULAR | Status: DC | PRN

## 2022-12-05 MED ORDER — OXYCODONE HCL 5 MG/5ML PO SOLN: 5.0000 mg | Freq: Once | ORAL | Status: DC | PRN

## 2022-12-05 MED ORDER — OXYCODONE HCL 5 MG PO TABS: 5.0000 mg | ORAL_TABLET | Freq: Once | ORAL | Status: DC | PRN

## 2022-12-05 MED ORDER — CHLORHEXIDINE GLUCONATE 0.12 % MT SOLN
15.0000 mL | Freq: Once | OROMUCOSAL | Status: AC
  Administered 2022-12-05: 15 mL via OROMUCOSAL
  Filled 2022-12-05: qty 15

## 2022-12-05 MED ORDER — PROPOFOL 500 MG/50ML IV EMUL
INTRAVENOUS | Status: DC | PRN
  Administered 2022-12-05: 200 ug/kg/min via INTRAVENOUS
  Administered 2022-12-05: 125 ug/kg/min via INTRAVENOUS

## 2022-12-05 MED ORDER — ONDANSETRON HCL 4 MG/2ML IJ SOLN
INTRAMUSCULAR | Status: DC | PRN
  Administered 2022-12-05: 4 mg via INTRAVENOUS

## 2022-12-05 MED ORDER — SUCCINYLCHOLINE CHLORIDE 200 MG/10ML IV SOSY
PREFILLED_SYRINGE | INTRAVENOUS | Status: DC | PRN
  Administered 2022-12-05: 120 mg via INTRAVENOUS

## 2022-12-05 MED ORDER — LACTATED RINGERS IV SOLN: INTRAVENOUS | Status: DC

## 2022-12-05 MED ORDER — DEXAMETHASONE SODIUM PHOSPHATE 10 MG/ML IJ SOLN
INTRAMUSCULAR | Status: DC | PRN
  Administered 2022-12-05: 4 mg via INTRAVENOUS

## 2022-12-05 MED ORDER — FENTANYL CITRATE (PF) 100 MCG/2ML IJ SOLN: 25.0000 ug | INTRAMUSCULAR | Status: DC | PRN

## 2022-12-05 MED ORDER — PROPOFOL 10 MG/ML IV BOLUS
INTRAVENOUS | Status: DC | PRN
Start: 2022-12-05 — End: 2022-12-05
  Administered 2022-12-05: 140 mg via INTRAVENOUS
  Administered 2022-12-05 (×3): 10 mg via INTRAVENOUS
  Administered 2022-12-05: 20 mg via INTRAVENOUS

## 2022-12-05 MED ORDER — MIDAZOLAM HCL 2 MG/2ML IJ SOLN: 0.5000 mg | Freq: Once | INTRAMUSCULAR | Status: DC | PRN

## 2022-12-05 MED ORDER — LIDOCAINE 2% (20 MG/ML) 5 ML SYRINGE
INTRAMUSCULAR | Status: DC | PRN
  Administered 2022-12-05: 40 mg via INTRAVENOUS

## 2022-12-05 NOTE — Interval H&P Note (Signed)
History and Physical Interval Note:  12/05/2022 7:41 AM  Sonia Sandoval  has presented today for surgery, with the diagnosis of lung nodule, NTM.  The various methods of treatment have been discussed with the patient and family. After consideration of risks, benefits and other options for treatment, the patient has consented to  Procedure(s): ROBOTIC ASSISTED NAVIGATIONAL BRONCHOSCOPY (Bilateral) as a surgical intervention.  The patient's history has been reviewed, patient examined, no change in status, stable for surgery.  I have reviewed the patient's chart and labs.  Questions were answered to the patient's satisfaction.     Rachel Bo 

## 2022-12-05 NOTE — Discharge Instructions (Signed)

## 2022-12-05 NOTE — Anesthesia Procedure Notes (Signed)
Procedure Name: Intubation Date/Time: 12/05/2022 8:40 AM  Performed by: Rachel Moulds, CRNAPre-anesthesia Checklist: Emergency Drugs available, Patient identified, Suction available, Patient being monitored and Timeout performed Patient Re-evaluated:Patient Re-evaluated prior to induction Oxygen Delivery Method: Circle system utilized Preoxygenation: Pre-oxygenation with 100% oxygen Induction Type: IV induction Ventilation: Mask ventilation without difficulty Laryngoscope Size: Mac and 3 Grade View: Grade II Tube type: Oral Tube size: 8.5 mm Number of attempts: 1 Airway Equipment and Method: Stylet Placement Confirmation: ETT inserted through vocal cords under direct vision, positive ETCO2, CO2 detector and breath sounds checked- equal and bilateral Secured at: 21 cm Tube secured with: Tape Dental Injury: Teeth and Oropharynx as per pre-operative assessment

## 2022-12-05 NOTE — Op Note (Signed)
Video Bronchoscopy with Robotic Assisted Bronchoscopic Navigation   Date of Operation: 12/05/2022   Pre-op Diagnosis: lung nodule, Mycobacterium abscessus  Post-op Diagnosis: Lung nodule, Mycobacterium abscessus  Surgeon: Josephine Igo, DO  Assistants: None   Anesthesia: General endotracheal anesthesia  Operation: Flexible video fiberoptic bronchoscopy with robotic assistance and biopsies.  Estimated Blood Loss: Minimal  Complications: None  Indications and History: Sonia Sandoval is a 72 y.o. female with history of lung nodule, Mycobacterium abscessus. The risks, benefits, complications, treatment options and expected outcomes were discussed with the patient.  The possibilities of pneumothorax, pneumonia, reaction to medication, pulmonary aspiration, perforation of a viscus, bleeding, failure to diagnose a condition and creating a complication requiring transfusion or operation were discussed with the patient who freely signed the consent.    Description of Procedure: The patient was seen in the Preoperative Area, was examined and was deemed appropriate to proceed.  The patient was taken to [facility] endoscopy room 3, identified as Sonia Sandoval and the procedure verified as Flexible Video Fiberoptic Bronchoscopy.  A Time Out was held and the above information confirmed.   Prior to the date of the procedure a high-resolution CT scan of the chest was performed. Utilizing ION software program a virtual tracheobronchial tree was generated to allow the creation of distinct navigation pathways to the patient's parenchymal abnormalities. After being taken to the operating room general anesthesia was initiated and the patient  was orally intubated. The video fiberoptic bronchoscope was introduced via the endotracheal tube and a general inspection was performed which showed normal right and left lung anatomy, aspiration of the bilateral mainstems was completed to remove any remaining secretions. Robotic  catheter inserted into patient's endotracheal tube.   Target #1 right upper lobe pulmonary nodule, Mycobacterium abscessus: The distinct navigation pathways prepared prior to this procedure were then utilized to navigate to patient's lesion identified on CT scan. The robotic catheter was secured into place and the vision probe was withdrawn.  Lesion location was approximated using fluoroscopy and for peripheral targeting. Under fluoroscopic guidance transbronchial forceps biopsies were performed to be sent for cytology and pathology. A bronchioalveolar lavage was performed in the right upper lobe and sent for cultures.  BAL was complete following robotic portion of the case to be sent for cultures using the standard therapeutic scope.  At the end of the procedure a general airway inspection was performed and there was no evidence of active bleeding. The bronchoscope was removed.  The patient tolerated the procedure well. There was no significant blood loss and there were no obvious complications. A post-procedural chest x-ray is pending.  Samples Target #1: 1. Transbronchial forceps biopsies from RUL 2. Bronchoalveolar lavage from RUL  Plans:  The patient will be discharged from the PACU to home when recovered from anesthesia and after chest x-ray is reviewed. We will review the microbiology results with the patient when they become available. Outpatient followup will be with Dr. Renold Don from [facility].   Josephine Igo, DO [facility] 12/05/2022 9:03 AM

## 2022-12-05 NOTE — Anesthesia Postprocedure Evaluation (Signed)
Anesthesia Post Note  Patient: Sonia Sandoval  Procedure(s) Performed: ROBOTIC ASSISTED NAVIGATIONAL BRONCHOSCOPY (Bilateral) BRONCHIAL BIOPSIES BRONCHIAL WASHINGS     Patient location during evaluation: PACU Anesthesia Type: General Level of consciousness: awake and alert, patient cooperative and oriented Pain management: pain level controlled Vital Signs Assessment: post-procedure vital signs reviewed and stable Respiratory status: spontaneous breathing, nonlabored ventilation and respiratory function stable Cardiovascular status: blood pressure returned to baseline and stable Postop Assessment: no apparent nausea or vomiting and able to ambulate Anesthetic complications: no   No notable events documented.  Last Vitals:  Vitals:   12/05/22 0915 12/05/22 0930  BP: 129/63 124/83  Pulse: 75 71  Resp: 14 18  Temp:  37.3 C  SpO2: 100% 99%    Last Pain:  Vitals:   12/05/22 0904  TempSrc:   PainSc: 0-No pain                 ,E. 

## 2022-12-05 NOTE — Anesthesia Preprocedure Evaluation (Addendum)
Anesthesia Evaluation  Patient identified by MRN, date of birth, ID band Patient awake    Reviewed: Allergy & Precautions, NPO status , Patient's Chart, lab work & pertinent test results  History of Anesthesia Complications (+) PONV  Airway Mallampati: I  TM Distance: >3 FB Neck ROM: Full    Dental  (+) Dental Advisory Given   Pulmonary shortness of breath, COPD,  COPD inhaler Lung nodule Cavitary lesion of lung: mycobacterium   breath sounds clear to auscultation       Cardiovascular negative cardio ROS  Rhythm:Regular Rate:Normal     Neuro/Psych  Headaches  Anxiety        GI/Hepatic Neg liver ROS,,,H/o colon cancer   Endo/Other  negative endocrine ROS    Renal/GU negative Renal ROS     Musculoskeletal  (+) Arthritis ,    Abdominal   Peds  Hematology  (+) Blood dyscrasia, anemia Hb 9.6, plt 290k   Anesthesia Other Findings H/o breast cancer  Reproductive/Obstetrics                             Anesthesia Physical Anesthesia Plan  ASA: 3  Anesthesia Plan: General   Post-op Pain Management: Tylenol PO (pre-op)*   Induction: Intravenous  PONV Risk Score and Plan: 4 or greater and Ondansetron, Dexamethasone and TIVA  Airway Management Planned: Oral ETT  Additional Equipment: None  Intra-op Plan:   Post-operative Plan: Extubation in OR  Informed Consent: I have reviewed the patients History and Physical, chart, labs and discussed the procedure including the risks, benefits and alternatives for the proposed anesthesia with the patient or authorized representative who has indicated his/her understanding and acceptance.     Dental advisory given  Plan Discussed with: CRNA and Surgeon  Anesthesia Plan Comments:         Anesthesia Quick Evaluation

## 2022-12-05 NOTE — Transfer of Care (Signed)
Immediate Anesthesia Transfer of Care Note  Patient: Sonia Sandoval  Procedure(s) Performed: ROBOTIC ASSISTED NAVIGATIONAL BRONCHOSCOPY (Bilateral) BRONCHIAL BIOPSIES BRONCHIAL WASHINGS  Patient Location: PACU  Anesthesia Type:General  Level of Consciousness: awake, alert , and oriented  Airway & Oxygen Therapy: Patient Spontanous Breathing and Patient connected to nasal cannula oxygen  Post-op Assessment: Report given to RN and Post -op Vital signs reviewed and stable  Post vital signs: Reviewed and stable  Last Vitals:  Vitals Value Taken Time  BP 133/74   Temp    Pulse 72 12/05/22 0905  Resp 18   SpO2 100 % 12/05/22 0905  Vitals shown include unfiled device data.  Last Pain:  Vitals:   12/05/22 0742  TempSrc:   PainSc: 0-No pain         Complications: No notable events documented.

## 2022-12-08 ENCOUNTER — Encounter: Payer: Self-pay | Admitting: Pharmacist

## 2022-12-08 ENCOUNTER — Encounter (HOSPITAL_COMMUNITY): Payer: Self-pay | Admitting: Pulmonary Disease

## 2022-12-08 LAB — CULTURE, BAL-QUANTITATIVE W GRAM STAIN
Culture: NO GROWTH
Gram Stain: NONE SEEN

## 2022-12-09 ENCOUNTER — Telehealth: Payer: Self-pay | Admitting: Pharmacist

## 2022-12-09 NOTE — Telephone Encounter (Signed)
Patient picked up 2 bottles of clofazimine on 12/09/22. Supply should last for ~3 months. Next refill due around the middle/end of November.   L. Jannette Fogo, PharmD, BCIDP, AAHIVP, CPP Clinical Pharmacist Practitioner Infectious Diseases Clinical Pharmacist [facility] 12/09/2022, 11:31 AM

## 2022-12-11 LAB — AEROBIC/ANAEROBIC CULTURE W GRAM STAIN (SURGICAL/DEEP WOUND)
Culture: NORMAL
Gram Stain: NONE SEEN

## 2022-12-19 ENCOUNTER — Ambulatory Visit: Payer: Commercial Managed Care - PPO | Admitting: Pulmonary Disease

## 2022-12-19 ENCOUNTER — Encounter: Payer: Self-pay | Admitting: Pulmonary Disease

## 2022-12-19 VITALS — BP 120/72 | HR 78 | Ht 63.0 in | Wt 146.4 lb

## 2022-12-19 DIAGNOSIS — R911 Solitary pulmonary nodule: Secondary | ICD-10-CM

## 2022-12-19 DIAGNOSIS — R0600 Dyspnea, unspecified: Secondary | ICD-10-CM | POA: Diagnosis not present

## 2022-12-19 DIAGNOSIS — A310 Pulmonary mycobacterial infection: Secondary | ICD-10-CM | POA: Diagnosis not present

## 2022-12-19 DIAGNOSIS — L989 Disorder of the skin and subcutaneous tissue, unspecified: Secondary | ICD-10-CM

## 2022-12-19 DIAGNOSIS — J984 Other disorders of lung: Secondary | ICD-10-CM

## 2022-12-19 DIAGNOSIS — Z85038 Personal history of other malignant neoplasm of large intestine: Secondary | ICD-10-CM

## 2022-12-19 DIAGNOSIS — Z853 Personal history of malignant neoplasm of breast: Secondary | ICD-10-CM

## 2022-12-19 NOTE — Patient Instructions (Signed)
Thank you for visiting Dr. Tonia Brooms at [facility]. Today we recommend the following:  Follow up with [facility]   Return if symptoms worsen or fail to improve.    Please do your part to reduce the spread of COVID-19.

## 2022-12-19 NOTE — Progress Notes (Signed)
Synopsis: Referred in April 2023 for lung nodule by Ladora Daniel, PA-C  Subjective:   PATIENT ID: Sonia Sandoval GENDER: female DOB: 28-Feb-1951, MRN: [id number]  Chief Complaint  Patient presents with   Follow-up    F/up on bronch.    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  Presents for evaluation of a right upper lobe cavitary lesion.  She denies cough sputum production.  No incarceration, no travel outside of the country.  No known exposures to TB.  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  She is here today for follow-up of this lung nodule.  She has a CT scan scheduled for Friday.  She was going to see me last fall but there was plans for changing of her insurance and so she pushed off the appointment until the following year.  And so she is here today to talk about this she does have a follow-up appointment with medical oncology next week.  OV 12/29/2021: Here today for follow-up.Last seen in the office by Jairo Ben, NP.  Seen by Dr. Renold Don from infectious disease.  Office note reviewed from yesterday.  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  Follow-up CT imaging was canceled because she had a CT completed in the emergency room on 12/07/2021.  This follow-up CT revealed slight interval decrease in the size of the cavity within the right upper lobe.  Doing well today.  Unfortunately had a recent drug reaction with skin manifestations.  Was stopped yesterday by one of her medicines.  She has follow-up with Dr. Renold Don relating this.  She had PFTs completed prior to today's office visit which we reviewed today.  She does have a ratio of 63 with an FEV1 of 67% predicted prebronchodilator and 68% change with postbronchodilator, TLC of 126%, RV of 141%, DLCO 125%.  She has  history of secondhand smoke exposure from her parents and a history of allergies as a kid.  From a respiratory standpoint she occasionally gets short of breath with significant exertion but otherwise no other symptomatology.  OV 11/17/2022: Here today for follow-up.  Initial cultures for her cavitary lesion came back with Mycobacterium abscessus she has been on a prolonged antibiotic attic course with infectious disease.  Here today for setting up of a repeat bronchoscopy for cultures that she is unable to produce significant amount of sputum to analyze.  OV 12/19/2022: Here for post bronchoscopy follow-up.  Cultures so far for NTM have no growth to date.  She does have some fungal elements seen on cultures.  Has follow-up with ID already scheduled.    Oncology History Overview Note  Cancer Staging Cancer of sigmoid colon [facility]) Staging form: Colon and Rectum, AJCC 8th Edition - Pathologic stage from 11/22/2019: Stage IIA (pT3, pN0, cM0) - Signed by Malachy Mood, MD on 12/17/2019  Malignant neoplasm of lower-outer quadrant of right breast of female, estrogen receptor positive (HCC) Staging form: Breast, AJCC 8th Edition - Clinical: Stage IIB (cT2, cN1, cM0, G3, ER+, PR+, HER2-) - Unsigned - Pathologic: Stage IIA (pT2, pN1(sn), cM0, G3, ER+, PR+, HER2-) - Unsigned    Malignant neoplasm of lower-outer quadrant of right breast of female, estrogen receptor positive (HCC)  10/23/2017 Initial Diagnosis    right breast lower outer quadrant biopsy 10/23/2017  for a clinical T2 N1, stage IIB invasive ductal carcinoma, grade 3, estrogen and progesterone receptor positive, HER-2 not amplified, with an MIB-1 of 20%   11/07/2017 Miscellaneous   Mammaprint on was read as low risk, predicting no significant chemotherapy benefit with a 5-year distant disease-free survival in the 97-98% range with hormone therapy alone   12/05/2017 Surgery   status post bilateral mastectomies on 12/05/2017 showing             (a) on  the left, no evidence of malignancy             (b) on the right, a pT2 pN1, stage IIB invasive ductal carcinoma, grade 3, with negative margins. By Dr Luisa Hart   01/15/2018 - 02/28/2018 Radiation Therapy   adjuvant radiation 01/15/2018 - 02/28/2018 with Dr Mitzi Hansen              Site/dose: The patient initially received a dose of 50.4 Gy in 28 fractions to the chest wall and supraclavicular region. This was delivered using a 3-D conformal, 4 field technique. The patient then received a boost to the mastectomy scar. This delivered an additional 10 Gy in 5 fractions using an en face electron field. The total dose was 60.4 Gy.    04/01/2018 -  Anti-estrogen oral therapy   started anastrozole in 04/2018. Switched to Exemestane in 08/2020 due to joint pain.             (a) bone density 03/07/2018 with a T score of -1.8. Her 04/21/20 DEXA showed osteopenia -1.8 at right total femur, with low risk for fracture.   10/28/2019 Genetic Testing   Negative genetic testing.  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  The Common Hereditary Gene Panel offered by [facility] includes sequencing and/or deletion duplication testing of the following 48 genes: APC, ATM, AXIN2, BARD1, BMPR1A, BRCA1, BRCA2, BRIP1, CDH1, CDK4, CDKN2A (p14ARF), CDKN2A (p16INK4a), CHEK2, CTNNA1, DICER1, EPCAM (Deletion/duplication testing only), GREM1 (promoter region deletion/duplication testing only), KIT, MEN1, MLH1, MSH2, MSH3, MSH6, MUTYH, NBN, NF1, NHTL1, PALB2, PDGFRA, PMS2, POLD1, POLE, PTEN, RAD50, RAD51C, RAD51D, RNF43, SDHB, SDHC, SDHD, SMAD4, SMARCA4. STK11, TP53, TSC1, TSC2, and VHL.  The following genes were evaluated for sequence changes only: SDHA and HOXB13 c.251G>A variant only. The report date is October 28, 2019.   03/08/2021 Imaging   CT Chest w/o contrast  IMPRESSION: 1. The previous FDG avid lung mass within the anterior right lower lobe has decreased in size compared with previous exam compatible  with response to therapy. The cavitary lung nodule within the right apex is slightly increased in size from previous exam. Additionally, there is a new irregular nodule within the medial right upper lobe measuring 6 mm. Previously 3 mm. 2. Interval increase in size of cavitary nodule within the right lung apex. This measures 2.3 x 1.5 cm, compared with 1.3 x 0.9 cm previously. Underlying malignancy cannot be excluded. 3. New small nodule within the right upper lobe measures 6 mm. This has a nonspecific appearance but warrants attention on follow-up imaging. 4. The remaining small pulmonary nodules noted previously are stable in the interval. 5. Coronary artery calcifications noted. 6. Aortic Atherosclerosis (ICD10-I70.0).   Cancer of sigmoid colon (HCC)  09/23/2019 Tumor Marker   Baseline CEA 1.4 on 09/23/19    09/23/2019 Procedure   Colonoscopy and Upper endoscopy with Dr Levora Angel 09/23/19  Upper Endoscopy Impression - Z-line regular, 35 cm from the incisors. - Small hiatal hernia. - Non-bleeding gastric ulcer.  Biopsied. - Hematin (altered blood/coffee-ground-like material) in the gastric body. - Normal duodenal bulb, first portion of the duodenum and second portion of - Normal duodenal bulb, first portion of the duodenum and second portion of the duodenum.  Colonoscopy Impression - Preparation of the colon was poor. - Perianal skin tags found on perianal exam. - Likely malignant tumor in the recto-sigmoid colon. Biopsied. Tattooed. - Internal hemorrhoids.   09/23/2019 Initial Biopsy   FINAL MICROSCOPIC DIAGNOSIS: 09/23/19 A. STOMACH, ULCER, BIOPSY:  - Gastric antral mucosa with mild nonspecific reactive gastropathy  - Warthin Starry stain is negative for Helicobacter pylori   B. COLON, RECTOSIGMOID, MASS, BIOPSY:  - Adenocarcinoma, see comment  COMMENT:   B.  Immunohistochemical stains for MMR-related proteins are pending and  will be reported in an addendum. Dr. Luisa Hart  reviewed the case and  concurs with the diagnosis. Dr. Levora Angel was notified on 09/24/2019.     ADDENDUM:  Mismatch Repair Protein (IHC)  SUMMARY INTERPRETATION: NORMAL  There is preserved expression of the major MMR proteins. There is a very  low probability that microsatellite instability (MSI) is present.  However, certain clinically significant MMR protein mutations may result  in preservation of nuclear expression. It is recommended that the  preservation of protein expression be correlated with molecular based  MSI testing.   IHC EXPRESSION RESULTS  TEST           RESULT  MLH1:          Preserved nuclear expression  MSH2:          Preserved nuclear expression  MSH6:          Preserved nuclear expression  PMS2:          Preserved nuclear expression    09/23/2019 Imaging   CT CAP W Contrast 09/23/19  IMPRESSION: 1. There is eccentric wall thickening of the rectosigmoid colon, in keeping with mass identified and biopsied by colonoscopy.   2. There are prominent subcentimeter lymph nodes in the sigmoid mesocolon, suspicious for nodal metastatic disease. There is no other lymphadenopathy in the abdomen or pelvis.   3. There are multiple small bilateral pulmonary nodules measuring up to 4 mm, nonspecific although very suspicious for pulmonary metastatic disease. Given history of breast malignancy, previous imaging of the chest would be very helpful to assess for stability. This report may be addended for comparison if prior imaging can be obtained.   4. Status post bilateral mastectomy with irregular subpleural opacity of the anterior right upper lobe, consistent with history of breast malignancy and radiation fibrosis.   5. There is adjacent nodularity of the right pulmonary apex, the largest discrete nodular component measuring up to 1.1 cm. This is more favored infectious or inflammatory although nonspecific and metastatic disease is not excluded. As as above, prior  imaging would be very helpful to assess.   6. Small focus of nonspecific infectious or inflammatory airspace disease of the perihilar left upper lobe.   09/26/2019 Initial Diagnosis   Cancer of sigmoid colon (HCC)   10/28/2019 PET scan   IMPRESSION: 1. Increased tracer uptake within the pelvis corresponding to wall thickening of the rectosigmoid colon favored to represent primary colorectal carcinoma. No findings of FDG avid nodal metastasis or solid organ metastasis within the abdomen or pelvis. 2. Within the right lung apex there is a cluster of 3 subpleural nodular densities which exhibit varying degrees of mild FDG uptake with SUV max between 1.9 and 3.1. Morphologically the appearance is suggestive  of benign postinflammatory pleuroparenchymal scarring. This would be an atypical pattern of metastatic disease, which is considered less favored, but not excluded. Attention in this area on serial follow-up imaging is advised. 3. Two small, less than 5 mm left lower lobe lung nodules are too small to reliably characterize. 4.  Aortic Atherosclerosis (ICD10-I70.0). 5. Nonobstructing right renal calculus.     11/22/2019 Cancer Staging   Staging form: Colon and Rectum, AJCC 8th Edition - Pathologic stage from 11/22/2019: Stage IIA (pT3, pN0, cM0) - Signed by Malachy Mood, MD on 12/17/2019   11/22/2019 Surgery   XI ROBOT ASSISTED SIGMOIDECTOMY and FLEXIBLE SIGMOIDOSCOPY by Dr white and Dr Maisie Fus   11/22/2019 Pathology Results   FINAL MICROSCOPIC DIAGNOSIS:   A. SIGMOID COLON, RESECTION:  - Invasive moderately differentiated adenocarcinoma, 4.5 cm, involving distal sigmoid colon  - Carcinoma focally invades into pericolonic soft tissue  - Resection margins are negative for carcinoma  - Negative for lymphovascular or perineural invasion  - Eighteen benign lymph nodes (0/18)  - See oncology table   B. FINAL DISTAL MARGIN, EXCISION:  - Colonic donut, negative for carcinoma    04/15/2020  Imaging   CT Chest  IMPRESSION: 1. New nodular 1.9 cm focus of consolidation in the anterior right lower lobe with surrounding mild patchy tree-in-bud opacity, nonspecific but favoring infectious/inflammatory etiology. 2. Clustered nodularity in the apical right upper lobe is stable in size and newly cavitary. These findings are indeterminate for atypical infection such as MAI versus response to therapy of pulmonary metastases. Additional scattered tiny left pulmonary nodules are stable. Suggest close chest CT follow-up in 3 months. 3. No thoracic adenopathy. 4. Stable dilated main pulmonary artery, suggesting chronic pulmonary arterial hypertension. 5. Aortic Atherosclerosis (ICD10-I70.0).   04/17/2020 Tumor Marker   Her Guardant Reveal showed negative - ctDNA not detected.    07/08/2020 Imaging   CT Chest  IMPRESSION: Slight reduction in size of hypermetabolic right upper lobe pulmonary nodule, nonspecific though favored to represent area of infection/inflammation as was initially suggested on chest CT performed 04/15/2020. No biopsy attempted at this time.   03/08/2021 Imaging   CT Chest w/o contrast  IMPRESSION: 1. The previous FDG avid lung mass within the anterior right lower lobe has decreased in size compared with previous exam compatible with response to therapy. The cavitary lung nodule within the right apex is slightly increased in size from previous exam. Additionally, there is a new irregular nodule within the medial right upper lobe measuring 6 mm. Previously 3 mm. 2. Interval increase in size of cavitary nodule within the right lung apex. This measures 2.3 x 1.5 cm, compared with 1.3 x 0.9 cm previously. Underlying malignancy cannot be excluded. 3. New small nodule within the right upper lobe measures 6 mm. This has a nonspecific appearance but warrants attention on follow-up imaging. 4. The remaining small pulmonary nodules noted previously are stable in the  interval. 5. Coronary artery calcifications noted. 6. Aortic Atherosclerosis (ICD10-I70.0).   04/04/2022 Imaging    IMPRESSION: 1. Cavitary right apical lesion has become confluent in the interval, measuring minimally smaller at 3.1 x 2.1 cm compared to 3.2 x 2.8 cm previously. 2. Stable left lower lobe pulmonary nodules measuring up to 6 mm. 3. Enlargement of the pulmonary outflow tract/main pulmonary arteries suggests pulmonary arterial hypertension. 4.  Aortic Atherosclerosis (ICD10-I70.0).   10/21/2022 Imaging    IMPRESSION: No evidence of recurrent or metastatic carcinoma within the abdomen or pelvis.   Tiny right renal calculus. No  evidence of ureteral calculi or hydronephrosis.   Large stool burden noted; recommend correlation for symptoms or signs of constipation.   Stable sub-cm left lower lobe pulmonary nodules. Recommend continued attention on follow-up imaging.        Past Medical History:  Diagnosis Date   Allergies 1995   Per patient report 11/14/18   Anemia    prior to finding colon cancer, no longer any issues with this per patient   Anxiety    Uses for sleep and long car rides   Arthritis    Breast cancer [facility])    Breast cancer, right breast (HCC) 09/2017   S/P mastectomy 12/05/2017   Colon cancer (HCC)    COPD (chronic obstructive pulmonary disease) (HCC)    pt reports previous MD diagnosed her with this, but takes no medications   Dyspnea    occasionally with exertion   Family history of breast cancer    Headache 1968   per patient report, started as a teenager   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   Mycobacterium infection    lung   Osteopenia 03/2018   Seen on DEXA Scan   PONV (postoperative nausea and vomiting)      Family History  Problem Relation Age of Onset   Dementia Mother    Lung cancer Father        smoked    Emphysema Father    Heart disease Maternal Grandfather    Cancer Maternal Aunt 43       breast cancer    Cancer Cousin        maternal first cousin with breast cancer in her 14s     Past Surgical History:  Procedure Laterality Date   BACK SURGERY  2003   Patient will occassional back pain since 2003   BIOPSY  09/23/2019   Procedure: BIOPSY;  Surgeon: Kathi Der, MD;  Location: [facility];  Service: Gastroenterology;;  EGD and COLON   BREAST BIOPSY Right 09/2017   BRONCHIAL BIOPSY  08/31/2021   Procedure: BRONCHIAL BIOPSIES;  Surgeon: Josephine Igo, DO;  Location: [facility];  Service: Pulmonary;;   BRONCHIAL BIOPSY  05/17/2022   Procedure: BRONCHIAL BIOPSIES;  Surgeon: Josephine Igo, DO;  Location: [facility];  Service: Pulmonary;;   BRONCHIAL BIOPSY  12/05/2022   Procedure: BRONCHIAL BIOPSIES;  Surgeon: Josephine Igo, DO;  Location: [facility];  Service: Pulmonary;;   BRONCHIAL BRUSHINGS  08/31/2021   Procedure: BRONCHIAL BRUSHINGS;  Surgeon: Josephine Igo, DO;  [facility]: [facility];  Service: Pulmonary;;   BRONCHIAL NEEDLE ASPIRATION BIOPSY  08/31/2021   Procedure: BRONCHIAL NEEDLE ASPIRATION BIOPSIES;  Surgeon: Josephine Igo, DO;  [facility]: [facility];  Service: Pulmonary;;   BRONCHIAL WASHINGS  08/31/2021   Procedure: BRONCHIAL WASHINGS;  Surgeon: Josephine Igo, DO;  [facility]: [facility];  Service: Pulmonary;;   BRONCHIAL WASHINGS  05/17/2022   Procedure: BRONCHIAL WASHINGS;  Surgeon: Josephine Igo, DO;  [facility];  Service: Pulmonary;;   BRONCHIAL WASHINGS  12/05/2022   Procedure: BRONCHIAL WASHINGS;  Surgeon: Josephine Igo, DO;  [facility]: [facility];  Service: Pulmonary;;   CESAREAN SECTION  1985; 1992   COLONOSCOPY WITH PROPOFOL N/A 09/23/2019   Procedure: COLONOSCOPY WITH PROPOFOL and EGD;  Surgeon: Kathi Der, MD;  Location: [facility];  Service: Gastroenterology;  Laterality: N/A;   DILATION AND CURETTAGE OF  UTERUS      ESOPHAGOGASTRODUODENOSCOPY N/A 09/23/2019   Procedure: ESOPHAGOGASTRODUODENOSCOPY (EGD);  Surgeon: Kathi Der, MD;  Location: [facility];  Service: Gastroenterology;  Laterality: N/A;   FLEXIBLE SIGMOIDOSCOPY N/A 11/22/2019   Procedure: FLEXIBLE SIGMOIDOSCOPY;  Surgeon: Andria Meuse, MD;  Location: [facility];  Service: General;  Laterality: N/A;   LUMBAR DISC SURGERY  2003   MASTECTOMY Left 12/05/2017   PROPHYLACTIC MASTECTOMY   MASTECTOMY COMPLETE / SIMPLE W/ SENTINEL NODE BIOPSY Right 12/05/2017   WITH RADIOACTIVE SEED TARGETED RIGHT AXIILARY LYMPH NODE EXCISION AND RIGHT SENTINEL LYMPH NODE BIOPSY,    MASTECTOMY WITH RADIOACTIVE SEED GUIDED EXCISION AND AXILLARY SENTINEL LYMPH NODE BIOPSY Bilateral 12/05/2017   Procedure: RIGHT SIMPLE MASTECTOMY WITH RADIOACTIVE SEED TARGETED RIGHT AXIILARY LYMPH NODE EXCISION AND RIGHT SENTINEL LYMPH NODE BIOPSY, LEFT PROPHYLACTIC MASTECTOMY;  Surgeon: Harriette Bouillon, MD;  Location: [facility];  Service: General;  Laterality: Bilateral;   SUBMUCOSAL TATTOO INJECTION  09/23/2019   Procedure: SUBMUCOSAL TATTOO INJECTION;  Surgeon: Kathi Der, MD;  Location: [facility];  Service: Gastroenterology;;   TOTAL HIP ARTHROPLASTY Right 07/12/2022   Procedure: RIGHT TOTAL HIP ARTHROPLASTY ANTERIOR APPROACH;  Surgeon: Ernestina Columbia, MD;  [facility]: [facility];  Service: Orthopedics;  Laterality: Right;   VIDEO BRONCHOSCOPY WITH RADIAL ENDOBRONCHIAL ULTRASOUND  08/31/2021   Procedure: RADIAL ENDOBRONCHIAL ULTRASOUND;  Surgeon: Josephine Igo, DO;  [facility]: [facility];  Service: Pulmonary;;    Social History   Socioeconomic History   Marital status: Married    Spouse name: Not on file   Number of children: 4   Years of education: Not on file   Highest education level: Not on file  Occupational History   Not on file  Tobacco Use   Smoking status: Never   Smokeless tobacco: Never  Vaping Use   Vaping status: Never Used  Substance and Sexual Activity    Alcohol use: Yes    Alcohol/week: 7.0 standard drinks of alcohol    Types: 7 Glasses of wine per week    Comment: 2-3 3oz glasses of wine per day   Drug use: Never   Sexual activity: Not on file  Other Topics Concern   Not on file  Social History Narrative   Not on file   Social Determinants of Health   Financial Resource Strain: Low Risk  (08/23/2022)   Received from [facility], [facility]   Overall Financial Resource Strain (CARDIA)    Difficulty of Paying Living Expenses: Not hard at all  Food Insecurity: No Food Insecurity (08/23/2022)   Received from [facility], [facility]   Hunger Vital Sign    Worried About Running Out of Food in the Last Year: Never true    Ran Out of Food in the Last Year: Never true  Transportation Needs: No Transportation Needs (08/23/2022)   Received from [facility], [facility]   PRAPARE - Transportation    Lack of Transportation (Medical): No    Lack of Transportation (Non-Medical): No  Physical Activity: Insufficiently Active (08/23/2022)   Received from [facility], [facility]   Exercise Vital Sign    Days of Exercise per Week: 4 days    Minutes of Exercise per Session: 10 min  Stress: Stress Concern Present (08/23/2022)   Received from [facility], [facility] - Occupational Stress Questionnaire    Feeling of Stress : To some extent  Social Connections: Socially Integrated (08/23/2022)   Received from [facility], [facility]   Social Network    How would you rate your social network (family, work, friends)?: Good participation with social networks  Intimate Partner Violence: Not At Risk (08/23/2022)   Received from [facility], [facility]   HITS    Over the last 12 months how often did your partner physically hurt you?: 1    Over the last 12 months how often did your partner insult you or talk down to you?: 1    Over the last 12 months how often did your partner  threaten you with physical harm?: 1    Over the last 12 months how often did your partner scream or curse at you?: 1     Allergies  Allergen Reactions   Tigecycline Diarrhea and Nausea And Vomiting   Codeine Nausea And Vomiting and Other (See Comments)    headache   Heparin Rash    Pt states she tolerates heparin flush for PICC but gets a full body rash with drip     Outpatient Medications Prior to Visit  Medication Sig Dispense Refill   albuterol (VENTOLIN HFA) 108 (90 Base) MCG/ACT inhaler TAKE 2 PUFFS BY MOUTH EVERY 6 HOURS AS NEEDED FOR WHEEZE OR SHORTNESS OF BREATH 18 each 2   ALPRAZolam (NIRAVAM) 0.5 MG dissolvable tablet Take 0.5 mg by mouth daily as needed (Sleep/Travel).     Calcium Carbonate-Vitamin D (CALCIUM 600+D PO) Take 600 mg by mouth daily.     Cholecalciferol (VITAMIN D) 50 MCG (2000 UT) tablet Take 2,000 Units by mouth daily.     CLOFAZIMINE PO Take 100 mg by mouth daily.     cyanocobalamin (VITAMIN B12) 1000 MCG tablet Take 1,000 mcg by mouth daily. Chew     exemestane (AROMASIN) 25 MG tablet TAKE 1 TABLET (25 MG TOTAL) BY MOUTH DAILY AFTER BREAKFAST. 90 tablet 1   gabapentin (NEURONTIN) 300 MG capsule Take 300 mg by mouth daily.     Omadacycline Tosylate 150 MG TABS Take 2 tablets (300 mg total) by mouth daily. Fast for 4 hours before taking and 2 hours after taking. 60 tablet 5   OVER THE COUNTER MEDICATION Take 1 tablet by mouth daily. Juice plus fruit blend     OVER THE COUNTER MEDICATION Take 1 capsule by mouth daily. Juice plus berry blend     OVER THE COUNTER MEDICATION Take 1 capsule by mouth daily. Juice plus veggie blend     Probiotic Product (PROBIOTIC PO) Take 2 capsules by mouth daily. 800 Million CFU     vitamin E 180 MG (400 UNITS) capsule Take 400 Units by mouth daily.     ferrous sulfate 324 MG TBEC Take 324 mg by mouth daily. (Patient not taking: Reported on 12/19/2022)     celecoxib (CELEBREX) 100 MG capsule Take 1 capsule (100 mg total) by mouth 2  (two) times daily. 60 capsule 2   No facility-administered medications prior to visit.    Review of Systems  Constitutional:  Negative for chills, fever, malaise/fatigue and weight loss.  HENT:  Negative for hearing loss, sore throat and tinnitus.   Eyes:  Negative for blurred vision and double vision.  Respiratory:  Negative for cough, hemoptysis, sputum production, shortness of breath, wheezing and stridor.   Cardiovascular:  Negative for chest pain, palpitations, orthopnea, leg swelling and PND.  Gastrointestinal:  Negative for abdominal pain, constipation, diarrhea, heartburn, nausea and vomiting.  Genitourinary:  Negative for dysuria, hematuria and urgency.  Musculoskeletal:  Negative for joint pain  and myalgias.  Skin:  Negative for itching and rash.  Neurological:  Negative for dizziness, tingling, weakness and headaches.  Endo/Heme/Allergies:  Negative for environmental allergies. Does not bruise/bleed easily.  Psychiatric/Behavioral:  Negative for depression. The patient is not nervous/anxious and does not have insomnia.   All other systems reviewed and are negative.    Objective:  Physical Exam Vitals reviewed.  Constitutional:      General: She is not in acute distress.    Appearance: She is well-developed.  HENT:     Head: Normocephalic and atraumatic.  Eyes:     General: No scleral icterus.    Conjunctiva/sclera: Conjunctivae normal.     Pupils: Pupils are equal, round, and reactive to light.  Neck:     Vascular: No JVD.     Trachea: No tracheal deviation.  Cardiovascular:     Rate and Rhythm: Normal rate and regular rhythm.     Heart sounds: Normal heart sounds. No murmur heard. Pulmonary:     Effort: Pulmonary effort is normal. No tachypnea, accessory muscle usage or respiratory distress.     Breath sounds: No stridor. No wheezing, rhonchi or rales.  Abdominal:     General: There is no distension.     Palpations: Abdomen is soft.     Tenderness: There is  no abdominal tenderness.  Musculoskeletal:        General: No tenderness.     Cervical back: Neck supple.  Lymphadenopathy:     Cervical: No cervical adenopathy.  Skin:    General: Skin is warm and dry.     Capillary Refill: Capillary refill takes less than 2 seconds.     Findings: No rash.     Comments: Left forearm, black lesion with lobulated margin  Neurological:     Mental Status: She is alert and oriented to person, place, and time.  Psychiatric:        Behavior: Behavior normal.      Vitals:   12/19/22 1550  BP: 120/72  Pulse: 78  SpO2: 98%  Weight: 146 lb 6.4 oz (66.4 kg)  Height: 5\' 3"  (1.6 m)    98% on RA BMI Readings from Last 3 Encounters:  12/19/22 25.93 kg/m  12/05/22 24.80 kg/m  11/24/22 25.69 kg/m   Wt Readings from Last 3 Encounters:  12/19/22 146 lb 6.4 oz (66.4 kg)  12/05/22 140 lb (63.5 kg)  11/24/22 145 lb (65.8 kg)     CBC    Component Value Date/Time   WBC 3.1 (L) 11/21/2022 1123   WBC 3.4 (L) 09/14/2022 0355   RBC 3.78 (L) 11/21/2022 1123   RBC 3.87 11/21/2022 1123   HGB 9.6 (L) 11/21/2022 1123   HCT 31.2 (L) 11/21/2022 1123   HCT 30.8 (L) 11/21/2022 1123   PLT 290 11/21/2022 1123   MCV 79.6 (L) 11/21/2022 1123   MCH 24.8 (L) 11/21/2022 1123   MCHC 31.2 11/21/2022 1123   RDW 18.6 (H) 11/21/2022 1123   LYMPHSABS 0.7 11/21/2022 1123   MONOABS 0.3 11/21/2022 1123   EOSABS 0.1 11/21/2022 1123   BASOSABS 0.0 11/21/2022 1123    Chest Imaging: CT chest November 2022: Right upper lobe cavitary lesion and smaller pulmonary nodules bilaterally.  Increased size of the nodule in the right lung. The patient's images have been independently reviewed by me.    April 2024: Lung nodule in the right upper lobe more solid now, less cavitary. CT imaging reviewed independently by me today in the office.  Pulmonary Functions Testing Results:    Latest Ref Rng & Units 12/29/2021   10:50 AM  PFT Results  FVC-Pre L 3.07   FVC-Predicted  Pre % 105   FVC-Post L 3.30   FVC-Predicted Post % 113   Pre FEV1/FVC % % 48   Post FEV1/FCV % % 76   FEV1-Pre L 1.48   FEV1-Predicted Pre % 67   FEV1-Post L 2.50   DLCO uncorrected ml/min/mmHg 23.71   DLCO UNC% % 125   DLCO corrected ml/min/mmHg 24.58   DLCO COR %Predicted % 130   DLVA Predicted % 118   TLC L 6.18   TLC % Predicted % 126   RV % Predicted % 141     FeNO: none  Pathology: none  Echocardiogram: none  Heart Catheterization: none    Assessment & Plan:     ICD-10-CM   1. Lung nodule  R91.1     2. Cavitary lesion of lung  J98.4     3. 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 Dyspnea, unspecified type  R06.00     5. History of breast cancer  Z85.3     6. 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  Repeat bronchoscopy with cultures still pending to see if treatments are working like they should.  She has follow-up with ID already scheduled. She has a skin lesion on the anterior portion of her left arm. She is going to need follow-up to see a dermatologist with this.  I encouraged her to make an appointment ASAP because I am concerned that this could be malignant.  She is good to call her dermatology office and get an appointment ASAP she sees a gentleman in [facility].  Plan: Keep ID follow-up appointment. Keep appointment with [facility] as scheduled. I am assuming they will let me know if they need any other repeat cultures in the future. Follow-up with [place] as needed.    Current Outpatient Medications:    albuterol (VENTOLIN HFA) 108 (90 Base) MCG/ACT inhaler, TAKE 2 PUFFS BY MOUTH EVERY 6 HOURS AS NEEDED FOR WHEEZE OR SHORTNESS OF BREATH, Disp: 18 each, Rfl: 2   ALPRAZolam (NIRAVAM) 0.5 MG dissolvable tablet, Take 0.5 mg by mouth daily as needed  (Sleep/Travel)., Disp: , Rfl:    Calcium Carbonate-Vitamin D (CALCIUM 600+D PO), Take 600 mg by mouth daily., Disp: , Rfl:    Cholecalciferol (VITAMIN D) 50 MCG (2000 UT) tablet, Take 2,000 Units by mouth daily., Disp: , Rfl:    CLOFAZIMINE PO, Take 100 mg by mouth daily., Disp: , Rfl:    cyanocobalamin (VITAMIN B12) 1000 MCG tablet, Take 1,000 mcg by mouth daily. Chew, Disp: , Rfl:    exemestane (AROMASIN) 25 MG tablet, TAKE 1 TABLET (25 MG TOTAL) BY MOUTH DAILY AFTER BREAKFAST., Disp: 90 tablet, Rfl: 1   gabapentin (NEURONTIN) 300 MG capsule, Take 300 mg by mouth daily., Disp: , Rfl:    Omadacycline Tosylate 150 MG TABS, Take 2 tablets (300 mg total) by mouth daily. Fast for 4 hours before taking and 2 hours after taking., Disp: 60 tablet, Rfl: 5   OVER THE COUNTER MEDICATION, Take 1 tablet by mouth daily. Juice plus fruit blend, Disp: , Rfl:    OVER THE COUNTER MEDICATION, Take 1 capsule by  mouth daily. Juice plus berry blend, Disp: , Rfl:    OVER THE COUNTER MEDICATION, Take 1 capsule by mouth daily. Juice plus veggie blend, Disp: , Rfl:    Probiotic Product (PROBIOTIC PO), Take 2 capsules by mouth daily. 800 Million CFU, Disp: , Rfl:    vitamin E 180 MG (400 UNITS) capsule, Take 400 Units by mouth daily., Disp: , Rfl:    ferrous sulfate 324 MG TBEC, Take 324 mg by mouth daily. (Patient not taking: Reported on 12/19/2022), Disp: , Rfl:    Josephine Igo, DO [facility] 12/19/2022 4:57 PM

## 2022-12-22 IMAGING — DX DG CHEST 1V PORT
1 series · 1 of 1 positions shown · non-contrast
Comparison: Chest radiograph 09/20/2019, CT chest 08/06/2021

CLINICAL DATA: Status post bronchoscopy with biopsy

EXAM:
PORTABLE CHEST 1 VIEW

[chest]
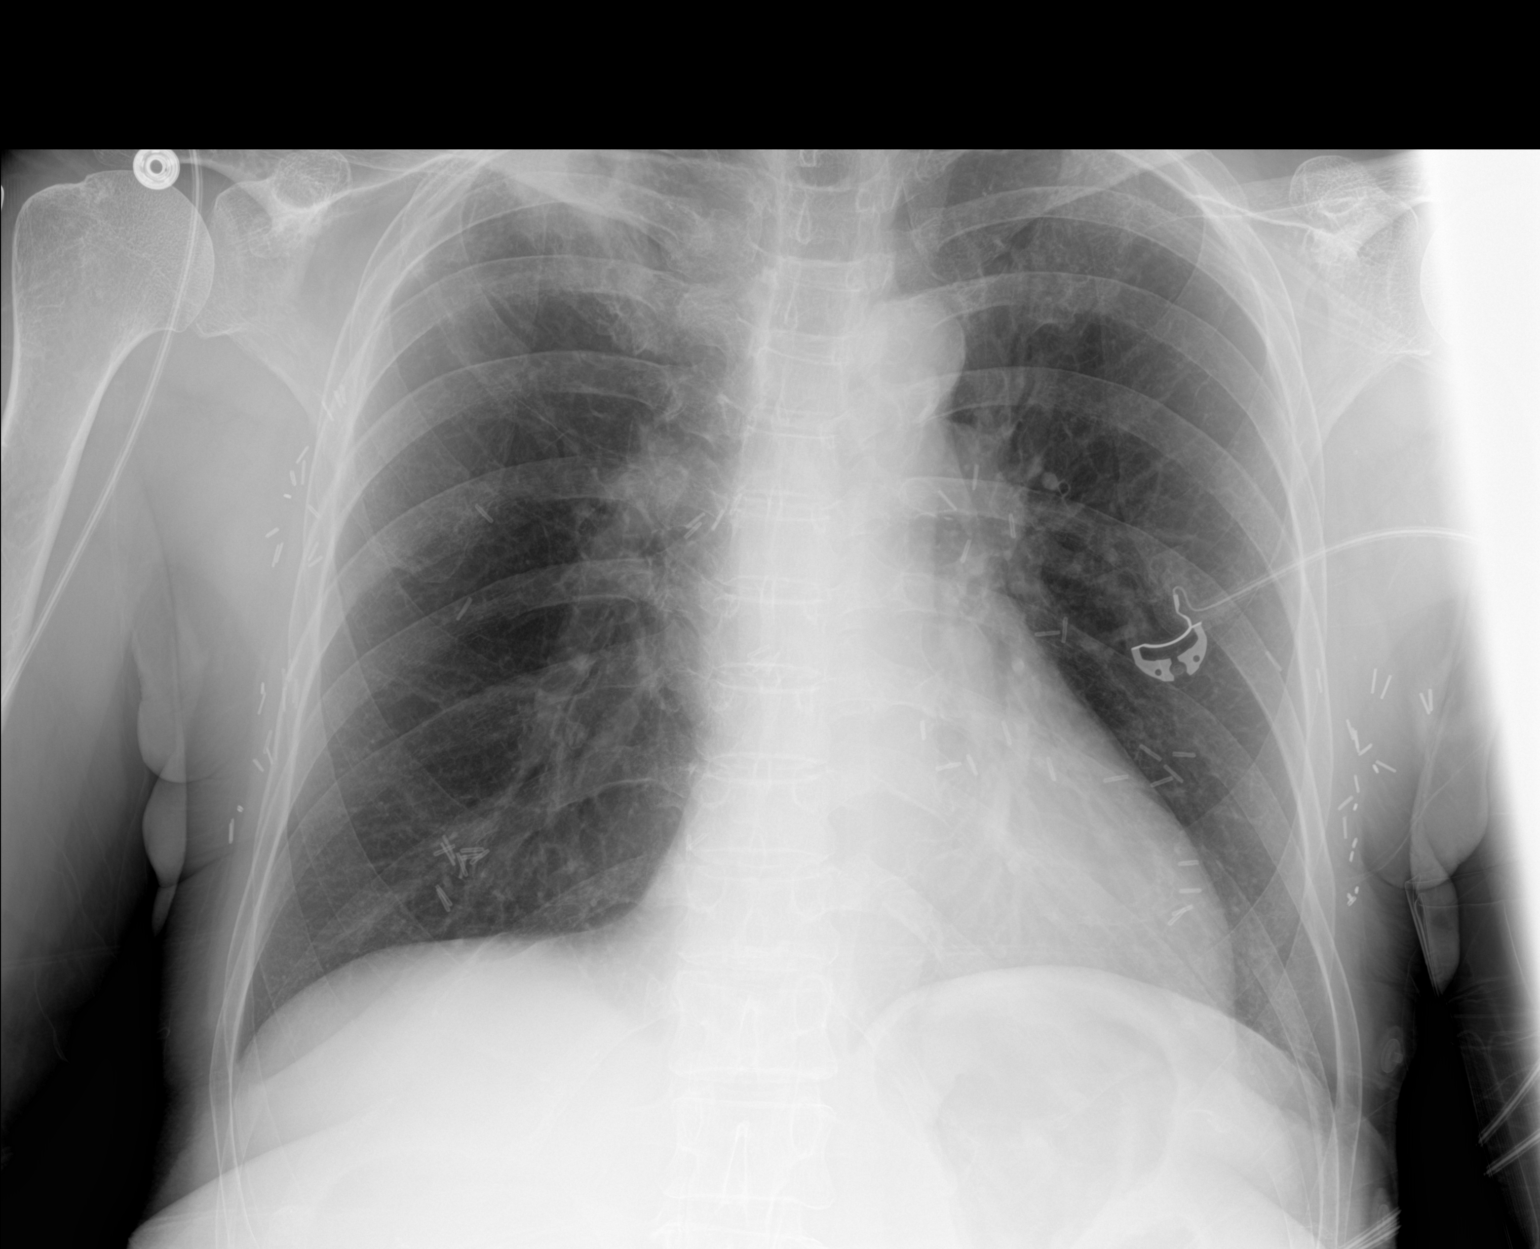

[1 of 1 positions shown; findings below may reference images not displayed]

FINDINGS: The cardiomediastinal silhouette is stable is within normal limits.

There is confluent opacity with central lucency in the right lung
apex corresponding to the cavitary lesion seen on the prior chest
CT. There is no evidence of postprocedural complication. No
pneumothorax is identified. There is no other focal airspace
disease. There is no pleural effusion. There is no left pneumothorax

There is no acute osseous abnormality. Numerous surgical clips are
noted in the axilla and chest wall.
IMPRESSION: No evidence of complication following right upper lobe cavitary
lesion biopsy. No pneumothorax.

## 2023-01-03 ENCOUNTER — Ambulatory Visit: Payer: Commercial Managed Care - PPO | Admitting: Internal Medicine

## 2023-01-11 ENCOUNTER — Other Ambulatory Visit (HOSPITAL_COMMUNITY): Payer: Self-pay

## 2023-01-19 LAB — ACID FAST CULTURE WITH REFLEXED SENSITIVITIES (MYCOBACTERIA)
Acid Fast Culture: NEGATIVE
Acid Fast Culture: NEGATIVE

## 2023-01-25 ENCOUNTER — Inpatient Hospital Stay: Payer: Commercial Managed Care - PPO | Attending: Hematology

## 2023-01-25 ENCOUNTER — Other Ambulatory Visit: Payer: Self-pay

## 2023-01-25 DIAGNOSIS — Z170 Estrogen receptor positive status [ER+]: Secondary | ICD-10-CM | POA: Insufficient documentation

## 2023-01-25 DIAGNOSIS — D649 Anemia, unspecified: Secondary | ICD-10-CM

## 2023-01-25 DIAGNOSIS — C50511 Malignant neoplasm of lower-outer quadrant of right female breast: Secondary | ICD-10-CM | POA: Insufficient documentation

## 2023-01-25 DIAGNOSIS — Z79899 Other long term (current) drug therapy: Secondary | ICD-10-CM | POA: Diagnosis not present

## 2023-01-25 DIAGNOSIS — C187 Malignant neoplasm of sigmoid colon: Secondary | ICD-10-CM | POA: Insufficient documentation

## 2023-01-25 LAB — CBC WITH DIFFERENTIAL (CANCER CENTER ONLY)
Abs Immature Granulocytes: 0.01 10*3/uL (ref 0.00–0.07)
Basophils Absolute: 0 10*3/uL (ref 0.0–0.1)
Basophils Relative: 1 %
Eosinophils Absolute: 0.1 10*3/uL (ref 0.0–0.5)
Eosinophils Relative: 3 %
HCT: 34.3 % — ABNORMAL LOW (ref 36.0–46.0)
Hemoglobin: 11 g/dL — ABNORMAL LOW (ref 12.0–15.0)
Immature Granulocytes: 0 %
Lymphocytes Relative: 26 %
Lymphs Abs: 0.9 10*3/uL (ref 0.7–4.0)
MCH: 27.4 pg (ref 26.0–34.0)
MCHC: 32.1 g/dL (ref 30.0–36.0)
MCV: 85.5 fL (ref 80.0–100.0)
Monocytes Absolute: 0.3 10*3/uL (ref 0.1–1.0)
Monocytes Relative: 10 %
Neutro Abs: 2.1 10*3/uL (ref 1.7–7.7)
Neutrophils Relative %: 60 %
Platelet Count: 267 10*3/uL (ref 150–400)
RBC: 4.01 MIL/uL (ref 3.87–5.11)
RDW: 17.7 % — ABNORMAL HIGH (ref 11.5–15.5)
WBC Count: 3.5 10*3/uL — ABNORMAL LOW (ref 4.0–10.5)
nRBC: 0 % (ref 0.0–0.2)

## 2023-01-25 LAB — CMP (CANCER CENTER ONLY)
ALT: 29 U/L (ref 0–44)
AST: 27 U/L (ref 15–41)
Albumin: 3.8 g/dL (ref 3.5–5.0)
Alkaline Phosphatase: 82 U/L (ref 38–126)
Anion gap: 5 (ref 5–15)
BUN: 16 mg/dL (ref 8–23)
CO2: 27 mmol/L (ref 22–32)
Calcium: 9 mg/dL (ref 8.9–10.3)
Chloride: 108 mmol/L (ref 98–111)
Creatinine: 0.82 mg/dL (ref 0.44–1.00)
GFR, Estimated: >60 mL/min (ref >60)
Glucose, Bld: 106 mg/dL — ABNORMAL HIGH (ref 70–99)
Potassium: 4.1 mmol/L (ref 3.5–5.1)
Sodium: 140 mmol/L (ref 135–145)
Total Bilirubin: 0.4 mg/dL (ref 0.3–1.2)
Total Protein: 6.3 g/dL — ABNORMAL LOW (ref 6.5–8.1)

## 2023-01-25 LAB — FERRITIN: Ferritin: 10 ng/mL — ABNORMAL LOW (ref 11–307)

## 2023-01-26 ENCOUNTER — Telehealth: Payer: Self-pay

## 2023-01-26 ENCOUNTER — Ambulatory Visit (INDEPENDENT_AMBULATORY_CARE_PROVIDER_SITE_OTHER): Payer: Commercial Managed Care - PPO | Admitting: Internal Medicine

## 2023-01-26 ENCOUNTER — Other Ambulatory Visit: Payer: Self-pay

## 2023-01-26 VITALS — BP 146/89 | HR 74 | Temp 96.9°F | Resp 16 | Wt 148.0 lb

## 2023-01-26 DIAGNOSIS — Z2239 Carrier of other specified bacterial diseases: Secondary | ICD-10-CM

## 2023-01-26 DIAGNOSIS — A318 Other mycobacterial infections: Secondary | ICD-10-CM

## 2023-01-26 NOTE — Patient Instructions (Addendum)
Let's stop all antibiotics   So far the repeat sputum cx from the bronchoscopy hadn't grown anything. So technically this is microbiologic cure  As you never had much symptoms, just make sure given the high relapse rate, that you let us know if fever, chill, progressive short of breath, increased cough that last a long time   See me again before summer next year just to make sure you are still doing ok. I am not planning on any labs/imaging unless you have severe syptoms

## 2023-01-26 NOTE — Progress Notes (Signed)
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 Was supposed to be seen several months prior but didn't show.    08/31/21 underwent bronch: "The distinct navigation pathways prepared prior to this procedure were then utilized to navigate to patient's lesion identified on CT scan. The robotic catheter was secured into place and the vision probe was withdrawn.  Lesion location was approximated using fluoroscopy and radial endobronchial ultrasound for peripheral targeting. Under fluoroscopic guidance transbronchial needle brushings, transbronchial needle biopsies, and transbronchial forceps biopsies were performed to be sent for cytology and pathology. A bronchioalveolar lavage was performed in the  right upper lobe and sent for microbiology.  Additional transbronchial biopsies sent for tissue culture.   At the end of the procedure a general airway inspection was performed and there was no evidence of active bleeding. The bronchoscope was removed.  The patient tolerated the procedure well. There was no significant blood loss and there were no obvious complications. A post-procedural chest x-ray is pending."  Cytology didn't see cancer cells  On micro -- something is growing that looks like ntm and the MALDI technology calls it abscessus but it was sent on 5/8th to reference lab to identify   Patient without respiratory sx outside of stable mild dyspnea on exertion 4-5 months. She would encounter this walking up a hill No b sx Has watery eyes and a recent dry cough but query if it's allergy No reflux No decreased appetite No weight loss   11/04/21 id clinic f/u Patient is back for f/u post susceptibility testing which show azithromycin/amikacin sensitive m abscessus She continues to feel well without b sx, cough, dyspnea, chest pain She is accompanied by her significant others today  12/28/21 id clinic f/u Patient developed itchy rash about 10 days ago involving trunk/ext. Takes benadryl and topical hydrocortisone. 2 benadryls to help sleep. Also takes claritin. Rash is getting worse Opat labs elevated lft but improving (moderate in 100s), without eosinophilia Amikacin trough 8/24 is 0.8 with creatinine 0.98 No fever chill Occasional cough Went to ed 8/08 for sob after extreme exertion and chest ct was done -- no pe. Improved cavitation.  No joint pain/muscle aching No new vision change; she endorses having cataracts Has chronic leg cramps which she has been  using tonic water for 6 months. Question of some tingling in hands/feet very mild  At some point she has recurrent elevation ak trough and cr and we had planned to substitute amikacin with linezolid but she done well as above  with AK....  01/21/22 id clinic f/u Cefoxitin was hold first week of 12/2021 Her rash is much better She does at one point had yeast vaginal infection and thrush about 2 weeks ago (she got fluconazole for vaginal yeast dose and some other topical gel otc for her mouth). She still feel some taste change on her tongue/feels raw as well with hot temperature especially No visual changes No numbness/tingling hands feet Current regimen: Amikacin Azith Clofazimine Linezolid (Doxy) Waiting for omadocycline approval again from insurance No n/v/diarrhea Rare cough Stable short of breath  She takes b6 daily for a while now  She had submitted 3 afb sputum but said the sputum are mostly upper oral. She can't get sputum up   02/08/22 id clinic f/u Patient last took abx a week ago. She was on tigycline at that time and had severe intolerable nausea/vomiting The doxycycline is out of the question is it should never be used to m-abscessus  She is ready to start again  03/16/22 id clinic f/u Patient started on amikacin, omado, clofaz, and azithromycin within a few days of 02/08/22 visit She is doing fine on those without any side effect Cough minimal/stable No b sx No weight loss -- weight gain actually No dyspnea Will have body ct to f/u breast cancer this December Can't get sputum up    06/16/22 id clinic f/u Had bronch mid jan and afb cx negative Chest ct 04/2022 showed stable cavitary changes sign but had become confluent No f/c/nightsweat, cough Not able to get up any sputum Reviewed opat labs. Normal creatinine and crp No n/v/diarrhea No rash  Skin is more pigmented and she asked why    09/14/22 id clinic f/u Had elective right hip arthroplasty 07/12/22. Not able to fully bear weight yet but improving. The incision had not fully closed -- there is one area about 1 cm along the incision inferiorly that is "gold" color on bandage. Size smaller. No pus, fever, chill. She is  following up with [facility]; 2 weeks prior to this visit there was a probe into the wound opening and testing was sent but she is not sure what was tested. No new abx She is tolerating clofaz/omado/clarithro No n/v/diarrhea No rash No cough She has seasonal allergy spring and fall bad 3 weeks prior developed pressure sensation in her head and had muffled hearing since. No facial pain. No purulent discharge from nose.  No teeth pain. She is being referred to ent Intermittent dry cough with her allergy     01/26/23 id clinic fu See a&P   Review of Systems: ROS All other ros negative      Past Medical History:  Diagnosis Date   Allergies 1995   Per patient report 11/14/18   Anemia    prior to finding colon cancer, no longer any issues with this per patient   Anxiety    Uses for sleep and long car rides   Arthritis    Breast cancer (HCC)    Breast cancer, right breast (HCC) 09/2017   S/P mastectomy 12/05/2017   Colon cancer (HCC)    COPD (chronic obstructive pulmonary disease) (HCC)    pt reports previous MD diagnosed her with this, but takes no medications  Dyspnea    occasionally with exertion   Family history of breast cancer    Headache 1968   per patient report, started as a teenager   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   Mycobacterium infection    lung   Osteopenia 03/2018   Seen on DEXA Scan   PONV (postoperative nausea and vomiting)     Social History   Tobacco Use   Smoking status: Never   Smokeless tobacco: Never  Vaping Use   Vaping status: Never Used  Substance Use Topics   Alcohol use: Yes    Alcohol/week: 7.0 standard drinks of alcohol    Types: 7 Glasses of wine per week    Comment: 2-3 3oz glasses of wine per day   Drug use: Never    Family History  Problem Relation Age of Onset   Dementia Mother    Lung cancer Father         smoked   Emphysema Father    Heart disease Maternal Grandfather    Cancer Maternal Aunt 19       breast cancer    Cancer Cousin        maternal first cousin with breast cancer in her 45s    Allergies  Allergen Reactions   Tigecycline Diarrhea and Nausea And Vomiting   Codeine Nausea And Vomiting and Other (See Comments)    headache   Heparin Rash    Pt states she tolerates heparin flush for PICC but gets a full body rash with drip    OBJECTIVE: Vitals:   01/26/23 1536  BP: (!) 146/89  Pulse: 74  Resp: 16  Temp: (!) 96.9 F (36.1 C)  TempSrc: Temporal  SpO2: 98%  Weight: 148 lb (67.1 kg)    Body mass index is 26.22 kg/m.   Physical Exam General/constitutional: no distress, pleasant HEENT: Normocephalic, PER, Conj Clear, EOMI, Oropharynx clear -- ear canals with minimal dry ear wax but normal appearing TM lining without evidence of fluid behind tm Neck supple CV: rrr no mrg Lungs: clear to auscultation, normal respiratory effort Abd: Soft, Nontender Ext: no edema Skin: No Rash Neuro: nonfocal MSK: no peripheral joint swelling/tenderness/warmth; back spines nontender         Lab: Lab Results  Component Value Date   WBC 3.5 (L) 01/25/2023   HGB 11.0 (L) 01/25/2023   HCT 34.3 (L) 01/25/2023   MCV 85.5 01/25/2023   PLT 267 01/25/2023   Last metabolic panel Lab Results  Component Value Date   GLUCOSE 106 (H) 01/25/2023   NA 140 01/25/2023   K 4.1 01/25/2023   CL 108 01/25/2023   CO2 27 01/25/2023   BUN 16 01/25/2023   CREATININE 0.82 01/25/2023   GFRNONAA >60 01/25/2023   CALCIUM 9.0 01/25/2023   PHOS 3.6 11/14/2018   PROT 6.3 (L) 01/25/2023   ALBUMIN 3.8 01/25/2023   BILITOT 0.4 01/25/2023   ALKPHOS 82 01/25/2023   AST 27 01/25/2023   ALT 29 01/25/2023   ANIONGAP 5 01/25/2023    Microbiology:  Serology:  Imaging: I have reviewed the imaging personally and incorporated into decision making  08/06/21 chest abd pelv ct with  contrast FINDINGS: CT CHEST FINDINGS   Cardiovascular: Aortic atherosclerosis. Normal heart size. No pericardial effusion.   Mediastinum/Nodes: No enlarged mediastinal, hilar, or axillary lymph nodes. Thyroid gland, trachea, and esophagus demonstrate no significant  findings.   Lungs/Pleura: Interval increase in wall thickening and cavitation within the right pulmonary apex, overall lesion now confluent and measuring 3.8 x 3.1 cm (series 4, image 24). Increased clustered centrilobular nodularity and small consolidations in this vicinity (series 4, image 37). Residual bandlike scarring in the bilateral lung bases (series 4, image 109, 122). Occasional small nodules of the left lower lobe measuring up to 0.5 cm (series 4, image 21). No pleural effusion or pneumothorax.   Musculoskeletal: No chest wall mass or suspicious osseous lesions identified. Status post bilateral mastectomy.   CT ABDOMEN PELVIS FINDINGS   Hepatobiliary: No solid liver abnormality is seen. No gallstones, gallbladder wall thickening, or biliary dilatation.   Pancreas: Unremarkable. No pancreatic ductal dilatation or surrounding inflammatory changes.   Spleen: Normal in size without significant abnormality.   Adrenals/Urinary Tract: Adrenal glands are unremarkable. Punctuate nonobstructive calculus in the inferior pole of the right kidney (series 5, image 89). No left-sided calculi, ureteral calculi, or hydronephrosis. Bladder is unremarkable.   Stomach/Bowel: Stomach is within normal limits. Appendix is not clearly visualized and may be surgically absent. The cecum is anterior to the liver. Status post sigmoid colon resection and reanastomosis. No evidence of bowel wall thickening, distention, or inflammatory changes.   Vascular/Lymphatic: Aortic atherosclerosis. No enlarged abdominal or pelvic lymph nodes.   Reproductive: No mass or other abnormality.   Other: No abdominal wall hernia or  abnormality. No ascites.   Musculoskeletal: No acute osseous findings.   IMPRESSION: 1. Status post sigmoid colon resection and reanastomosis. No evidence of lymphadenopathy or metastatic disease in the chest, abdomen, or pelvis. 2. Interval increase in wall thickening and cavitation within the right pulmonary apex, overall lesion now confluent and measuring 3.8 x 3.1 cm. Increased clustered centrilobular nodularity and small consolidations in this vicinity. Findings are consistent with ongoing, worsened cavitary infection. 3. Additional small nodules of the left lower lobe measuring up to 0.5 cm, unchanged and almost certainly benign, incidental sequelae of infection inflammation. 4. Nonobstructive right nephrolithiasis.   12/07/21  chest cta 1. No pulmonary embolism. No acute intrathoracic pathology identified. 2. Slight interval decrease in size of a cavitary lesion within the right upper lobe in keeping with at least partial response to therapy. Repeat imaging in 6-12 months would be helpful in confirming stability or resolution exclude the possibility of an underlying cavitating malignancy 3. Stable 5 mm subpleural pulmonary nodule within the left lower lobe, safely considered benign.   Assessment/plan: Problem List Items Addressed This Visit   None Abx: 10/10 - 01/23/23 omadocycline, clofaz, azith  02/08/22-06/16/22 amikacin (9/7-10/03 doxy) 9/6-10/03 linezolid 7/6-10/03 amikacin 7/6-10/03 azith 7/6-10/03 clofazimine  7/6-9/07 cefoxitin    Discussed natural history, epidimiology, pathogenesis, diagnosis criteria and treatment indication for ntm lungs  Discussed resistant nature of abscessus, cure rate, relapse rate  At this time she is completely assymptomatic but imaging has indicated progression of cavitary lesion. On cytology there was no malignant cells. She had hx of colon cancer and breast cancer at least 3 years prior to this admission, and all are in  remission  At this time culture is still pending final ID confirmation and susceptibility testing  I would treat her based on progression of ct scan  Will await susceptibility and follow up with her in around 4-6 weeks to decide on treatment regimen    7/6 assessment Doing well Due to cavitary progression will do 2 iv's and 2 po's  Initial regimen will be iv amikacin, cefoxitin and po clofazimine  and azithromycin  The initial regimen will be at least 1-3 months as long as she can tolerate.  After the 3 months potentially we can keep 1 iv and the 2 PO's or transition her iv's to inhaled amikacin with the 2 po's  Our pharmacist team speaks with her today. Will start clofaz and azith today  Once picc is in she can start the cefoxitin continuous daily infusion and tiw amikacin   Repeat chest ct in 6-8 weeks Repeat afb sputum x3 1 month after starting medication   8/29 assessment Ct improved on 8/8 Rash ?cefoxitin. No sign of DRESS or arthus reaction otherwise. Will look in to the newer tetracycline along with bedaquilline for her. Can continue as is for now her current drug regimen 3 sputum early morning ordered for afb testing -- discussed to do it before eating/drinking/brushing/gargling Repeat chest ct in 3-6 months   Discussed with our id pharmacy team   ----------- 01/21/22 id assessment For some reason insurance is making it difficult in getting omadocycline approved. They want her to try doxycycline first  There is no evidence doxycycline works for [employer], and omadocycline or tigecycline would need to be the back bone of treatment (tigecycline of course much more adverse side effect).  Our pharmacist is trying to get this approved with insurance. Twice now, awaiting result  She is on amikacin, azith, clofazimine, linezolid now along with doxy (as requested by the insurance).  Cefoxitin stopped due to rash thought to be related to it.  She is tolerating  it  Follow up with me in 4-6 weeks  I will ask dr Tonia Brooms about repeating a bronch in 3-6 months to get sputum again as she can't get good sputum by herself. I am not confident I can trust her expectorated sputum for sterility check  Will plan on repeat chest ct in another 3 months and see if dr Tonia Brooms could help get some sputum in 3-6 months  02/08/22 id assessment Patient ready to start abx again. Couldn't tolerate tigecycline Iv omadocycline is covered by her insurance. They had several times unreasonably and illegally denied oral omadocycline  Will resume amikacin and omadocycline iv, with oral azithromycin and clofazimine  Plan 3 months iv then transition to oral hopefully by that time her sputum has sterilization of m-abscessus (so around 05/2022)  03/16/22 id assessment Patient to arrange body ct with her oncologist this December She'll be at 3 months of current regimen for m-abscessus by 05/2022 which I think she should have sputum assessment again I'll ask dr Tonia Brooms to do bronchoscopy to get afb sputum culture at that time (around end of December or early 05/2022 F/u with me after 4-6 weeks after bronchoscopy done so we can decide if we need to drop one of the iv antibiotics   06/16/22 id assessment Doing well Ct chest would say fairly stable 05/2022 bronch sample afb culture negative She can't get sputum so will have to repeat bronch in 08/2022 and need at least a year of negative sample before we can stop all antibiotics  For now we can stop amikacin Will continue iv omado, oral clofaz and azithromycin  Will check with our pharmacy team to see if transitioning from iv to PO omadocycline an option (insurance issue). And will also see if inhaled amikacin liposomal an option  Will let dr icard know to bronch er in 08/2022 Chest ct in 4-6 weeks  See me 4 weeks after bronchoscopy   09/14/22 id assessment We  are about due for repeat sputum afb culture which she'll need bronchoscopy  due to inability to get expectorated sputum. She'll follow up with dr Tonia Brooms to get this done by June 2024 Right hip arthroplasty 3/15 with still not fully healed incision -- not sure if there is concern for hardware take issue or infection; I asked patient to get record and any recent swab testing done of the wound Continue current 3 antibiotics clofaz/azith/omadocycline Routine blood test for abx monitoring F/u with me 2-4 weeks after egd is done  Sign of eustachian tube dysfunction pending ent evaluation. Will send chart to our pharmacy team too to inquire about abx side effect but I do not think that's the case   Chart to be sent to dr Icard asking for bronchoscopy/afb cx by 10/2022   ----------- Addendum 09/15/22 received records from ortho 4/24 swab cx of the wound opening on the right hip --> no organisms seen; rare wbc; cx negative 4/24 sed rate 11; crp < 3 (<8)    11/24/22 id clinic assessment She went off azithromycin during may visit with me and continued on omadocycline/clofazimine End of 08/2022 linezolid added but she only took 2 weeks as she felt nauseous.  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  Her hip surgery had recovered.   Her hearing hasn't changed since stopping azithromycin    At this time, oral options to make it a 3 drug regimen seems limited. She doesn't want to add back iv antibiotics. I do not want to start her on arikayce in the setting of hearing loss  She mentions quality of life is more important   I would wait before adding any other antibiotics at this time, pending the bronchoscopy which is in early 12/2022  I will also refer her to [facility] to have second opinion on treatment plan   I will see her early 01/2023  I would also want to repeat  sputum/chest ct early next year   01/26/23 id clinic assessment She saw Dr Valentina Lucks at [facility] She was very happy about the experience there I reviewed dr Stout's assessment. I agree and also at this time all cultures on treatment have remained negative. Sx wise not mch change and she still has hearing loss which I am unclear where it comes from; still not quite convinced the azithromycin did it   Eitherway we all agree to focus on quality of life and stop abx now short of the recommended duration based on pulm ntm guideline  I'll see her before summer 2025 to see how she is doing  No plan at this time to repeat labs/imaging unless she is very symptomatic  She is to see [place] sooner than then if dyspea, cough, b sx, weight loss  Will copy this to dr Tonia Brooms as well  Follow-up: Return in about 9 months (around 10/26/2023).  Raymondo Band, MD [facility] 01/26/2023, 3:34 PM

## 2023-01-26 NOTE — Telephone Encounter (Addendum)
Called patient and relayed message below. Patient doesn't want to do Iron infusion at this time. She did take the iron supplement but only for 3 weeks due to GI issues caused by the iron. I informed patient she could try the prenatal vitamins or the [place] chewable vit they would be easier on the GI symptoms. She stated she would try those and talk to Dr. Mosetta Putt at her next appointment in 2 months.   ----- Message from Carlean Jews sent at 01/26/2023  9:17 AM EDT ----- Good morning.  This patient has mild anemia, but has significant iron deficiency. Dr. Mosetta Putt had recommended oral iron for 2 months because she has poor IV access. Her ferritin level went from 8 to 10. I think, if she is agreeable, she should get IV iron at this point. Aleaya Latona, can you talk to the patient and see if she is agreeable to this, and Delice Bison, can you tell us which iron is preferred by her insurance? Thanks so much. Herbert Seta

## 2023-03-22 ENCOUNTER — Encounter: Payer: Self-pay | Admitting: Hematology

## 2023-03-22 ENCOUNTER — Other Ambulatory Visit: Payer: Self-pay

## 2023-03-22 ENCOUNTER — Inpatient Hospital Stay: Payer: Commercial Managed Care - PPO | Attending: Hematology

## 2023-03-22 ENCOUNTER — Inpatient Hospital Stay (HOSPITAL_BASED_OUTPATIENT_CLINIC_OR_DEPARTMENT_OTHER): Payer: Commercial Managed Care - PPO | Admitting: Hematology

## 2023-03-22 VITALS — BP 139/79 | HR 67 | Temp 98.1°F | Resp 18 | Ht 63.0 in | Wt 154.1 lb

## 2023-03-22 DIAGNOSIS — Z923 Personal history of irradiation: Secondary | ICD-10-CM | POA: Diagnosis not present

## 2023-03-22 DIAGNOSIS — K259 Gastric ulcer, unspecified as acute or chronic, without hemorrhage or perforation: Secondary | ICD-10-CM | POA: Insufficient documentation

## 2023-03-22 DIAGNOSIS — M85851 Other specified disorders of bone density and structure, right thigh: Secondary | ICD-10-CM | POA: Diagnosis not present

## 2023-03-22 DIAGNOSIS — M1990 Unspecified osteoarthritis, unspecified site: Secondary | ICD-10-CM | POA: Insufficient documentation

## 2023-03-22 DIAGNOSIS — K449 Diaphragmatic hernia without obstruction or gangrene: Secondary | ICD-10-CM | POA: Diagnosis not present

## 2023-03-22 DIAGNOSIS — Z9013 Acquired absence of bilateral breasts and nipples: Secondary | ICD-10-CM | POA: Insufficient documentation

## 2023-03-22 DIAGNOSIS — Z1721 Progesterone receptor positive status: Secondary | ICD-10-CM | POA: Insufficient documentation

## 2023-03-22 DIAGNOSIS — C187 Malignant neoplasm of sigmoid colon: Secondary | ICD-10-CM | POA: Diagnosis present

## 2023-03-22 DIAGNOSIS — K644 Residual hemorrhoidal skin tags: Secondary | ICD-10-CM | POA: Insufficient documentation

## 2023-03-22 DIAGNOSIS — D500 Iron deficiency anemia secondary to blood loss (chronic): Secondary | ICD-10-CM | POA: Diagnosis not present

## 2023-03-22 DIAGNOSIS — Z79811 Long term (current) use of aromatase inhibitors: Secondary | ICD-10-CM | POA: Insufficient documentation

## 2023-03-22 DIAGNOSIS — Z170 Estrogen receptor positive status [ER+]: Secondary | ICD-10-CM

## 2023-03-22 DIAGNOSIS — C7800 Secondary malignant neoplasm of unspecified lung: Secondary | ICD-10-CM | POA: Insufficient documentation

## 2023-03-22 DIAGNOSIS — D649 Anemia, unspecified: Secondary | ICD-10-CM

## 2023-03-22 DIAGNOSIS — C50511 Malignant neoplasm of lower-outer quadrant of right female breast: Secondary | ICD-10-CM | POA: Diagnosis not present

## 2023-03-22 DIAGNOSIS — Z1732 Human epidermal growth factor receptor 2 negative status: Secondary | ICD-10-CM | POA: Diagnosis not present

## 2023-03-22 DIAGNOSIS — Z79899 Other long term (current) drug therapy: Secondary | ICD-10-CM | POA: Diagnosis not present

## 2023-03-22 DIAGNOSIS — Z885 Allergy status to narcotic agent status: Secondary | ICD-10-CM | POA: Insufficient documentation

## 2023-03-22 DIAGNOSIS — Z888 Allergy status to other drugs, medicaments and biological substances status: Secondary | ICD-10-CM | POA: Diagnosis not present

## 2023-03-22 DIAGNOSIS — I2510 Atherosclerotic heart disease of native coronary artery without angina pectoris: Secondary | ICD-10-CM | POA: Diagnosis not present

## 2023-03-22 DIAGNOSIS — K648 Other hemorrhoids: Secondary | ICD-10-CM | POA: Diagnosis not present

## 2023-03-22 DIAGNOSIS — I700 Atherosclerosis of aorta: Secondary | ICD-10-CM | POA: Diagnosis not present

## 2023-03-22 LAB — CMP (CANCER CENTER ONLY)
ALT: 24 U/L (ref 0–44)
AST: 28 U/L (ref 15–41)
Albumin: 4 g/dL (ref 3.5–5.0)
Alkaline Phosphatase: 63 U/L (ref 38–126)
Anion gap: 5 (ref 5–15)
BUN: 16 mg/dL (ref 8–23)
CO2: 28 mmol/L (ref 22–32)
Calcium: 9.7 mg/dL (ref 8.9–10.3)
Chloride: 106 mmol/L (ref 98–111)
Creatinine: 0.86 mg/dL (ref 0.44–1.00)
GFR, Estimated: >60 mL/min (ref >60)
Glucose, Bld: 98 mg/dL (ref 70–99)
Potassium: 4 mmol/L (ref 3.5–5.1)
Sodium: 139 mmol/L (ref 135–145)
Total Bilirubin: 0.4 mg/dL (ref <1.2)
Total Protein: 6.4 g/dL — ABNORMAL LOW (ref 6.5–8.1)

## 2023-03-22 LAB — CBC WITH DIFFERENTIAL (CANCER CENTER ONLY)
Abs Immature Granulocytes: 0 10*3/uL (ref 0.00–0.07)
Basophils Absolute: 0 10*3/uL (ref 0.0–0.1)
Basophils Relative: 1 %
Eosinophils Absolute: 0.1 10*3/uL (ref 0.0–0.5)
Eosinophils Relative: 3 %
HCT: 35 % — ABNORMAL LOW (ref 36.0–46.0)
Hemoglobin: 11.9 g/dL — ABNORMAL LOW (ref 12.0–15.0)
Immature Granulocytes: 0 %
Lymphocytes Relative: 27 %
Lymphs Abs: 1.1 10*3/uL (ref 0.7–4.0)
MCH: 30.5 pg (ref 26.0–34.0)
MCHC: 34 g/dL (ref 30.0–36.0)
MCV: 89.7 fL (ref 80.0–100.0)
Monocytes Absolute: 0.4 10*3/uL (ref 0.1–1.0)
Monocytes Relative: 9 %
Neutro Abs: 2.6 10*3/uL (ref 1.7–7.7)
Neutrophils Relative %: 60 %
Platelet Count: 232 10*3/uL (ref 150–400)
RBC: 3.9 MIL/uL (ref 3.87–5.11)
RDW: 14.6 % (ref 11.5–15.5)
WBC Count: 4.2 10*3/uL (ref 4.0–10.5)
nRBC: 0 % (ref 0.0–0.2)

## 2023-03-22 LAB — FERRITIN: Ferritin: 14 ng/mL (ref 11–307)

## 2023-03-22 NOTE — Assessment & Plan Note (Signed)
-  She had a history of iron deficient anemia in the past -She developed a significant anemia after her hip surgery 3 months ago, required 1 unit of blood transfusion. -Anemia workup from November 21, 2022 showed significantly low ferritin level, normal B12 and folate, low reticulocyte count, normal haptoglobin, no evidence of hemolysis.  This is consistent with iron deficient anemia. -Her anemia has improved, but not consistent, she has been on oral iron.  I recommend IV iron, but the patient has poor IV access, and would like to try oral iron first.  She will take over-the-counter ferric sulfate 2 tablets a day for a month, then changed to 1 tablet daily. -Monitor her CBC and iron level closely

## 2023-03-22 NOTE — Progress Notes (Signed)
[facility]   Telephone:([phone]) [phone] Fax:([phone]) [phone]   Clinic Follow up Note   Patient Care Team: Ladora Daniel, PA-C as PCP - General (Physician Assistant) Harriette Bouillon, MD as Consulting Physician (General Surgery) Magrinat, Valentino Hue, MD (Inactive) as Consulting Physician (Oncology) Dorothy Puffer, MD as Consulting Physician (Radiation Oncology) Albin Fischer, RN as Registered Nurse Farris Has, MD as Referring Physician (Dermatology) Radonna Ricker, RN (Inactive) as Oncology Nurse Navigator Malachy Mood, MD as Consulting Physician (Hematology) Andria Meuse, MD as Consulting Physician (General Surgery) Malachy Mood, MD as Consulting Physician (Hematology) Barnett Abu, MD as Consulting Physician (Neurosurgery)  Date of Service:  03/22/2023  CHIEF COMPLAINT: f/u of breast cancer and anemia  CURRENT THERAPY:  Exemestane  Oncology History   Iron deficiency anemia due to chronic blood loss -She had a history of iron deficient anemia in the past -She developed a significant anemia after her hip surgery 3 months ago, required 1 unit of blood transfusion. -Anemia workup from November 21, 2022 showed significantly low ferritin level, normal B12 and folate, low reticulocyte count, normal haptoglobin, no evidence of hemolysis.  This is consistent with iron deficient anemia. -Her anemia has improved, but not consistent, she has been on oral iron.  I recommend IV iron, but the patient has poor IV access, and would like to try oral iron first.  She will take over-the-counter ferric sulfate 2 tablets a day for a month, then changed to 1 tablet daily. -Monitor her CBC and iron level closely    Malignant neoplasm of lower-outer quadrant of right breast of female, estrogen receptor positive (HCC) -Stage IIA,pT2N1M0, ER+/PR+/HER2-, G3 -diagnosed in 09/2017. S/p B/l mastectomies by Dr Luisa Hart and adjuvant radiation by Dr Mitzi Hansen -She started on Anastrozole in 04/2018,  switched to exemestane in 08/2020 due to concerns with joint pain. Ultimately discontinued 07/2021 due to worsening joint pain from osteoarthritis.  -We previously discussed switching to tamoxifen, patient declined due to concern for side effects.  Her arthralgia did not improve after she came off exemestane, so she restarted exemestane in 01/2022. She declined Tamoxifen  -She will complete 5 years of aromatase inhibitor in July 2025, I discussed the benefit of extended adjuvant AI.  I recommend breast cancer index test to see if she would benefit from extended AI.  She agrees.    Assessment and Plan    Breast Cancer Follow-up for breast cancer. On exemestane since May 2022 after switching from anastrozole. No current issues with exemestane. Discussed potential need for extended aromatase inhibitor therapy beyond five years. Proposed breast index test to determine benefit of extended therapy. Explained test could indicate need for additional two to five years of therapy. Patient agreed to the test. - Order breast index test - Follow up in six months to review breast index test results  Anemia Anemia has improved. Hemoglobin is now 11.9, up from 9 in July. Improvement likely due to cessation of antibiotics and use of prenatal vitamins. Patient reports feeling better with more energy. - Continue prenatal vitamins - Monitor hemoglobin levels  Osteopenia Osteopenia confirmed with bone density scan in August 2023. T-scores: lumbar spine -0.8, hip -1.5. Risk of fracture in ten years is 10%, and risk of hip fracture is 1.5%. Patient has been taking calcium supplements, contributing to slight improvement in bone density. - Repeat bone density scan in two years  General Health Maintenance Patient is generally well with no new complaints. Engages in physical activity such as yard work and  light weight exercises. No issues with spine or back. Recent hip replacement in March was successful. Discussed  importance of continued physical activity and weight management. - Encourage continued physical activity - Monitor weight and encourage weight loss if necessary  Follow-up -Lab reviewed, patient is clinically doing well overall -Continue exemestane, will order breast index test - follow up in six months and decide if she we will continue AI after 5 years based on test results        SUMMARY OF ONCOLOGIC HISTORY: Oncology History Overview Note  Cancer Staging Cancer of sigmoid colon [facility]) Staging form: Colon and Rectum, AJCC 8th Edition - Pathologic stage from 11/22/2019: Stage IIA (pT3, pN0, cM0) - Signed by Malachy Mood, MD on 12/17/2019  Malignant neoplasm of lower-outer quadrant of right breast of female, estrogen receptor positive (HCC) Staging form: Breast, AJCC 8th Edition - Clinical: Stage IIB (cT2, cN1, cM0, G3, ER+, PR+, HER2-) - Unsigned - Pathologic: Stage IIA (pT2, pN1(sn), cM0, G3, ER+, PR+, HER2-) - Unsigned    Malignant neoplasm of lower-outer quadrant of right breast of female, estrogen receptor positive (HCC)  10/23/2017 Initial Diagnosis    right breast lower outer quadrant biopsy 10/23/2017 for a clinical T2 N1, stage IIB invasive ductal carcinoma, grade 3, estrogen and progesterone receptor positive, HER-2 not amplified, with an MIB-1 of 20%   11/07/2017 Miscellaneous   Mammaprint on was read as low risk, predicting no significant chemotherapy benefit with a 5-year distant disease-free survival in the 97-98% range with hormone therapy alone   12/05/2017 Surgery   status post bilateral mastectomies on 12/05/2017 showing             (a) on the left, no evidence of malignancy             (b) on the right, a pT2 pN1, stage IIB invasive ductal carcinoma, grade 3, with negative margins. By Dr Luisa Hart   01/15/2018 - 02/28/2018 Radiation Therapy   adjuvant radiation 01/15/2018 - 02/28/2018 with Dr Mitzi Hansen              Site/dose: The patient initially received a dose of 50.4 Gy  in 28 fractions to the chest wall and supraclavicular region. This was delivered using a 3-D conformal, 4 field technique. The patient then received a boost to the mastectomy scar. This delivered an additional 10 Gy in 5 fractions using an en face electron field. The total dose was 60.4 Gy.    04/01/2018 -  Anti-estrogen oral therapy   started anastrozole in 04/2018. Switched to Exemestane in 08/2020 due to joint pain.             (a) bone density 03/07/2018 with a T score of -1.8. Her 04/21/20 DEXA showed osteopenia -1.8 at right total femur, with low risk for fracture.   10/28/2019 Genetic Testing   Negative genetic testing.  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  The Common Hereditary Gene Panel offered by [facility] includes sequencing and/or deletion duplication testing of the following 48 genes: APC, ATM, AXIN2, BARD1, BMPR1A, BRCA1, BRCA2, BRIP1, CDH1, CDK4, CDKN2A (p14ARF), CDKN2A (p16INK4a), CHEK2, CTNNA1, DICER1, EPCAM (Deletion/duplication testing only), GREM1 (promoter region deletion/duplication testing only), KIT, MEN1, MLH1, MSH2, MSH3, MSH6, MUTYH, NBN, NF1, NHTL1, PALB2, PDGFRA, PMS2, POLD1, POLE, PTEN, RAD50, RAD51C, RAD51D, RNF43, SDHB, SDHC, SDHD, SMAD4, SMARCA4. STK11, TP53, TSC1, TSC2, and VHL.  The following genes were evaluated for sequence changes only: SDHA and HOXB13 c.251G>A variant only. The report date is October 28, 2019.   03/08/2021 Imaging   CT Chest w/o contrast  IMPRESSION: 1. The previous FDG avid lung mass within the anterior right lower lobe has decreased in size compared with previous exam compatible with response to therapy. The cavitary lung nodule within the right apex is slightly increased in size from previous exam. Additionally, there is a new irregular nodule within the medial right upper lobe measuring 6 mm. Previously 3 mm. 2. Interval increase in size of cavitary nodule within the right lung apex. This measures 2.3 x 1.5 cm,  compared with 1.3 x 0.9 cm previously. Underlying malignancy cannot be excluded. 3. New small nodule within the right upper lobe measures 6 mm. This has a nonspecific appearance but warrants attention on follow-up imaging. 4. The remaining small pulmonary nodules noted previously are stable in the interval. 5. Coronary artery calcifications noted. 6. Aortic Atherosclerosis (ICD10-I70.0).   Cancer of sigmoid colon (HCC)  09/23/2019 Tumor Marker   Baseline CEA 1.4 on 09/23/19    09/23/2019 Procedure   Colonoscopy and Upper endoscopy with Dr Levora Angel 09/23/19  Upper Endoscopy Impression - Z-line regular, 35 cm from the incisors. - Small hiatal hernia. - Non-bleeding gastric ulcer. Biopsied. - Hematin (altered blood/coffee-ground-like material) in the gastric body. - Normal duodenal bulb, first portion of the duodenum and second portion of - Normal duodenal bulb, first portion of the duodenum and second portion of the duodenum.  Colonoscopy Impression - Preparation of the colon was poor. - Perianal skin tags found on perianal exam. - Likely malignant tumor in the recto-sigmoid colon. Biopsied. Tattooed. - Internal hemorrhoids.   09/23/2019 Initial Biopsy   FINAL MICROSCOPIC DIAGNOSIS: 09/23/19 A. STOMACH, ULCER, BIOPSY:  - Gastric antral mucosa with mild nonspecific reactive gastropathy  - Warthin Starry stain is negative for Helicobacter pylori   B. COLON, RECTOSIGMOID, MASS, BIOPSY:  - Adenocarcinoma, see comment  COMMENT:   B.  Immunohistochemical stains for MMR-related proteins are pending and  will be reported in an addendum. Dr. Luisa Hart reviewed the case and  concurs with the diagnosis. Dr. Levora Angel was notified on 09/24/2019.     ADDENDUM:  Mismatch Repair Protein (IHC)  SUMMARY INTERPRETATION: NORMAL  There is preserved expression of the major MMR proteins. There is a very  low probability that microsatellite instability (MSI) is present.  However, certain clinically  significant MMR protein mutations may result  in preservation of nuclear expression. It is recommended that the  preservation of protein expression be correlated with molecular based  MSI testing.   IHC EXPRESSION RESULTS  TEST           RESULT  MLH1:          Preserved nuclear expression  MSH2:          Preserved nuclear expression  MSH6:          Preserved nuclear expression  PMS2:          Preserved nuclear expression    09/23/2019 Imaging   CT CAP W Contrast 09/23/19  IMPRESSION: 1. There is eccentric wall thickening of the rectosigmoid colon, in keeping with mass identified and biopsied by colonoscopy.   2. There are prominent subcentimeter lymph nodes in the sigmoid mesocolon, suspicious for nodal metastatic disease. There is no other lymphadenopathy in the abdomen or pelvis.   3. There are multiple small bilateral pulmonary nodules measuring up to 4 mm, nonspecific although very suspicious for pulmonary metastatic disease. Given history of breast malignancy, previous imaging of the chest would  be very helpful to assess for stability. This report may be addended for comparison if prior imaging can be obtained.   4. Status post bilateral mastectomy with irregular subpleural opacity of the anterior right upper lobe, consistent with history of breast malignancy and radiation fibrosis.   5. There is adjacent nodularity of the right pulmonary apex, the largest discrete nodular component measuring up to 1.1 cm. This is more favored infectious or inflammatory although nonspecific and metastatic disease is not excluded. As as above, prior imaging would be very helpful to assess.   6. Small focus of nonspecific infectious or inflammatory airspace disease of the perihilar left upper lobe.   09/26/2019 Initial Diagnosis   Cancer of sigmoid colon (HCC)   10/28/2019 PET scan   IMPRESSION: 1. Increased tracer uptake within the pelvis corresponding to wall thickening of the  rectosigmoid colon favored to represent primary colorectal carcinoma. No findings of FDG avid nodal metastasis or solid organ metastasis within the abdomen or pelvis. 2. Within the right lung apex there is a cluster of 3 subpleural nodular densities which exhibit varying degrees of mild FDG uptake with SUV max between 1.9 and 3.1. Morphologically the appearance is suggestive of benign postinflammatory pleuroparenchymal scarring. This would be an atypical pattern of metastatic disease, which is considered less favored, but not excluded. Attention in this area on serial follow-up imaging is advised. 3. Two small, less than 5 mm left lower lobe lung nodules are too small to reliably characterize. 4.  Aortic Atherosclerosis (ICD10-I70.0). 5. Nonobstructing right renal calculus.     11/22/2019 Cancer Staging   Staging form: Colon and Rectum, AJCC 8th Edition - Pathologic stage from 11/22/2019: Stage IIA (pT3, pN0, cM0) - Signed by Malachy Mood, MD on 12/17/2019   11/22/2019 Surgery   XI ROBOT ASSISTED SIGMOIDECTOMY and FLEXIBLE SIGMOIDOSCOPY by Dr white and Dr Maisie Fus   11/22/2019 Pathology Results   FINAL MICROSCOPIC DIAGNOSIS:   A. SIGMOID COLON, RESECTION:  - Invasive moderately differentiated adenocarcinoma, 4.5 cm, involving distal sigmoid colon  - Carcinoma focally invades into pericolonic soft tissue  - Resection margins are negative for carcinoma  - Negative for lymphovascular or perineural invasion  - Eighteen benign lymph nodes (0/18)  - See oncology table   B. FINAL DISTAL MARGIN, EXCISION:  - Colonic donut, negative for carcinoma    04/15/2020 Imaging   CT Chest  IMPRESSION: 1. New nodular 1.9 cm focus of consolidation in the anterior right lower lobe with surrounding mild patchy tree-in-bud opacity, nonspecific but favoring infectious/inflammatory etiology. 2. Clustered nodularity in the apical right upper lobe is stable in size and newly cavitary. These findings are  indeterminate for atypical infection such as MAI versus response to therapy of pulmonary metastases. Additional scattered tiny left pulmonary nodules are stable. Suggest close chest CT follow-up in 3 months. 3. No thoracic adenopathy. 4. Stable dilated main pulmonary artery, suggesting chronic pulmonary arterial hypertension. 5. Aortic Atherosclerosis (ICD10-I70.0).   04/17/2020 Tumor Marker   Her Guardant Reveal showed negative - ctDNA not detected.    07/08/2020 Imaging   CT Chest  IMPRESSION: Slight reduction in size of hypermetabolic right upper lobe pulmonary nodule, nonspecific though favored to represent area of infection/inflammation as was initially suggested on chest CT performed 04/15/2020. No biopsy attempted at this time.   03/08/2021 Imaging   CT Chest w/o contrast  IMPRESSION: 1. The previous FDG avid lung mass within the anterior right lower lobe has decreased in size compared with previous exam compatible with response  to therapy. The cavitary lung nodule within the right apex is slightly increased in size from previous exam. Additionally, there is a new irregular nodule within the medial right upper lobe measuring 6 mm. Previously 3 mm. 2. Interval increase in size of cavitary nodule within the right lung apex. This measures 2.3 x 1.5 cm, compared with 1.3 x 0.9 cm previously. Underlying malignancy cannot be excluded. 3. New small nodule within the right upper lobe measures 6 mm. This has a nonspecific appearance but warrants attention on follow-up imaging. 4. The remaining small pulmonary nodules noted previously are stable in the interval. 5. Coronary artery calcifications noted. 6. Aortic Atherosclerosis (ICD10-I70.0).   04/04/2022 Imaging    IMPRESSION: 1. Cavitary right apical lesion has become confluent in the interval, measuring minimally smaller at 3.1 x 2.1 cm compared to 3.2 x 2.8 cm previously. 2. Stable left lower lobe pulmonary nodules measuring up to  6 mm. 3. Enlargement of the pulmonary outflow tract/main pulmonary arteries suggests pulmonary arterial hypertension. 4.  Aortic Atherosclerosis (ICD10-I70.0).   10/21/2022 Imaging    IMPRESSION: No evidence of recurrent or metastatic carcinoma within the abdomen or pelvis.   Tiny right renal calculus. No evidence of ureteral calculi or hydronephrosis.   Large stool burden noted; recommend correlation for symptoms or signs of constipation.   Stable sub-cm left lower lobe pulmonary nodules. Recommend continued attention on follow-up imaging.        Discussed the use of AI scribe software for clinical note transcription with the patient, who gave verbal consent to proceed.  History of Present Illness   The patient, a 72 year old female with a history of breast cancer and anemia, presents for a routine follow-up. She reports feeling overall better with somewhat improved energy levels. She has been off antibiotics for three months, which she believes may have been suppressing her bone marrow and contributing to her anemia. She also stopped taking Juice Plus+ due to cost and discontinued prenatal vitamins, feeling they were unnecessary. Her hemoglobin has improved from 9 in July to 11.9 today.  The patient also mentions having had a hip replacement in March, which she describes as the "best thing" she's ever done, despite a lengthy recovery period of seven weeks with no weight bearing. She reports no current issues with her spine or back.  In terms of her breast cancer treatment, she has been on anastrozole since December 2019, switched to exemestane in May 2022, and had a brief period off the medication due to joint pain. She is currently on exemestane and has no issues with it. She has been on this treatment for nearly five years and is questioning the need for continued therapy.         All other systems were reviewed with the patient and are negative.  MEDICAL HISTORY:  Past  Medical History:  Diagnosis Date   Allergies 1995   Per patient report 11/14/18   Anemia    prior to finding colon cancer, no longer any issues with this per patient   Anxiety    Uses for sleep and long car rides   Arthritis    Breast cancer (HCC)    Breast cancer, right breast (HCC) 09/2017   S/P mastectomy 12/05/2017   Colon cancer (HCC)    COPD (chronic obstructive pulmonary disease) (HCC)    pt reports previous MD diagnosed her with this, but takes no medications   Dyspnea    occasionally with exertion   Family history of breast cancer  Headache 1968   per patient report, started as a teenager   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   Mycobacterium infection    lung   Osteopenia 03/2018   Seen on DEXA Scan   PONV (postoperative nausea and vomiting)     SURGICAL HISTORY: Past Surgical History:  Procedure Laterality Date   BACK SURGERY  2003   Patient will occassional back pain since 2003   BIOPSY  09/23/2019   Procedure: BIOPSY;  Surgeon: Kathi Der, MD;  Location: [facility];  Service: Gastroenterology;;  EGD and COLON   BREAST BIOPSY Right 09/2017   BRONCHIAL BIOPSY  08/31/2021   Procedure: BRONCHIAL BIOPSIES;  Surgeon: Josephine Igo, DO;  Location: [facility];  Service: Pulmonary;;   BRONCHIAL BIOPSY  05/17/2022   Procedure: BRONCHIAL BIOPSIES;  Surgeon: Josephine Igo, DO;  Location: [facility];  Service: Pulmonary;;   BRONCHIAL BIOPSY  12/05/2022   Procedure: BRONCHIAL BIOPSIES;  Surgeon: Josephine Igo, DO;  Location: [facility];  Service: Pulmonary;;   BRONCHIAL BRUSHINGS  08/31/2021   Procedure: BRONCHIAL BRUSHINGS;  Surgeon: Josephine Igo, DO;  Location: [facility];  Service: Pulmonary;;   BRONCHIAL NEEDLE ASPIRATION BIOPSY  08/31/2021   Procedure: BRONCHIAL NEEDLE ASPIRATION BIOPSIES;  Surgeon: Josephine Igo, DO;  Location: [facility];  Service: Pulmonary;;   BRONCHIAL WASHINGS  08/31/2021   Procedure: BRONCHIAL WASHINGS;  Surgeon: Josephine Igo, DO;  Location: [facility];  Service: Pulmonary;;   BRONCHIAL WASHINGS  05/17/2022   Procedure: BRONCHIAL WASHINGS;  Surgeon: Josephine Igo, DO;  Location: [facility];  Service: Pulmonary;;   BRONCHIAL WASHINGS  12/05/2022   Procedure: BRONCHIAL WASHINGS;  Surgeon: Josephine Igo, DO;  Location: [facility];  Service: Pulmonary;;   CESAREAN SECTION  1985; 1992   COLONOSCOPY WITH PROPOFOL N/A 09/23/2019   Procedure: COLONOSCOPY WITH PROPOFOL and EGD;  Surgeon: Kathi Der, MD;  Location: [facility];  Service: Gastroenterology;  Laterality: N/A;   DILATION AND CURETTAGE OF UTERUS     ESOPHAGOGASTRODUODENOSCOPY N/A 09/23/2019   Procedure: ESOPHAGOGASTRODUODENOSCOPY (EGD);  Surgeon: Kathi Der, MD;  Location: [facility];  Service: Gastroenterology;  Laterality: N/A;   FLEXIBLE SIGMOIDOSCOPY N/A 11/22/2019   Procedure: FLEXIBLE SIGMOIDOSCOPY;  Surgeon: Andria Meuse, MD;  Location: [facility];  Service: General;  Laterality: N/A;   LUMBAR DISC SURGERY  2003   MASTECTOMY Left 12/05/2017   PROPHYLACTIC MASTECTOMY   MASTECTOMY COMPLETE / SIMPLE W/ SENTINEL NODE BIOPSY Right 12/05/2017   WITH RADIOACTIVE SEED TARGETED RIGHT AXIILARY LYMPH NODE EXCISION AND RIGHT SENTINEL LYMPH NODE BIOPSY,    MASTECTOMY WITH RADIOACTIVE SEED GUIDED EXCISION AND AXILLARY SENTINEL LYMPH NODE BIOPSY Bilateral 12/05/2017   Procedure: RIGHT SIMPLE MASTECTOMY WITH RADIOACTIVE SEED TARGETED RIGHT AXIILARY LYMPH NODE EXCISION AND RIGHT SENTINEL LYMPH NODE BIOPSY, LEFT PROPHYLACTIC MASTECTOMY;  Surgeon: Harriette Bouillon, MD;  Location: [facility];  Service: General;  Laterality: Bilateral;   SUBMUCOSAL TATTOO INJECTION  09/23/2019   Procedure: SUBMUCOSAL TATTOO INJECTION;  Surgeon: Kathi Der, MD;  Location: [facility];  Service: Gastroenterology;;   TOTAL HIP ARTHROPLASTY Right  07/12/2022   Procedure: RIGHT TOTAL HIP ARTHROPLASTY ANTERIOR APPROACH;  Surgeon: Ernestina Columbia, MD;  Location: [facility];  Service: Orthopedics;  Laterality: Right;   VIDEO BRONCHOSCOPY WITH RADIAL ENDOBRONCHIAL ULTRASOUND  08/31/2021   Procedure: RADIAL ENDOBRONCHIAL ULTRASOUND;  Surgeon: Josephine Igo, DO;  Location:  [facility];  Service: Pulmonary;;    I have reviewed the social history and family history with the patient and they are unchanged from previous note.  ALLERGIES:  is allergic to cefoxitin, tigecycline, codeine, and heparin.  MEDICATIONS:  Current Outpatient Medications  Medication Sig Dispense Refill   ALPRAZolam (NIRAVAM) 0.5 MG dissolvable tablet Take 0.5 mg by mouth daily as needed (Sleep/Travel).     Calcium Carb-Cholecalciferol 600-25 MG-MCG CAPS Take by mouth.     Cholecalciferol (VITAMIN D) 50 MCG (2000 UT) tablet Take 2,000 Units by mouth daily.     cyanocobalamin (VITAMIN B12) 1000 MCG tablet Take 1,000 mcg by mouth daily. Chew     exemestane (AROMASIN) 25 MG tablet TAKE 1 TABLET (25 MG TOTAL) BY MOUTH DAILY AFTER BREAKFAST. 90 tablet 1   gabapentin (NEURONTIN) 300 MG capsule Take 300 mg by mouth daily.     pantoprazole (PROTONIX) 20 MG tablet Take 20 mg by mouth daily.     Prenatal Vit-Fe Fumarate-FA (PRENATAL VITAMINS PLUS PO)      Probiotic Product (PROBIOTIC PO) Take 2 capsules by mouth daily. 800 Million CFU     tiZANidine (ZANAFLEX) 4 MG tablet TAKE 1/2 TO 1 TABLET AT BEDTIME AS NEEDED FOR SPASM     vitamin E 180 MG (400 UNITS) capsule Take 400 Units by mouth daily.     No current facility-administered medications for this visit.    PHYSICAL EXAMINATION: ECOG PERFORMANCE STATUS: 0 - Asymptomatic  Vitals:   03/22/23 1319  BP: 139/79  Pulse: 67  Resp: 18  Temp: 98.1 F (36.7 C)  SpO2: 99%   Wt Readings from Last 3 Encounters:  03/22/23 154 lb 1.6 oz (69.9 kg)  01/26/23 148 lb (67.1 kg)  12/19/22 146 lb 6.4 oz (66.4 kg)     GENERAL:alert, no  distress and comfortable SKIN: skin color, texture, turgor are normal, no rashes or significant lesions EYES: normal, Conjunctiva are pink and non-injected, sclera clear NECK: supple, thyroid normal size, non-tender, without nodularity LYMPH:  no palpable lymphadenopathy in the cervical, axillary  LUNGS: clear to auscultation and percussion with normal breathing effort HEART: regular rate & rhythm and no murmurs and no lower extremity edema ABDOMEN:abdomen soft, non-tender and normal bowel sounds Musculoskeletal:no cyanosis of digits and no clubbing  NEURO: alert & oriented x 3 with fluent speech, no focal motor/sensory deficits BREAST: Bilateral mastectomy with scar tissue on chest wall, no nodules palpated.     LABORATORY DATA:  I have reviewed the data as listed    Latest Ref Rng & Units 03/22/2023   12:51 PM 01/25/2023   12:43 PM 11/21/2022   11:23 AM  CBC  WBC 4.0 - 10.5 K/uL 4.2  3.5  3.1   Hemoglobin 12.0 - 15.0 g/dL 24.4  01.0  9.6   Hematocrit 36.0 - 46.0 % 35.0  34.3  31.2    30.8   Platelets 150 - 400 K/uL 232  267  290         Latest Ref Rng & Units 03/22/2023   12:51 PM 01/25/2023   12:43 PM 11/21/2022   11:23 AM  CMP  Glucose 70 - 99 mg/dL 98  272  88   BUN 8 - 23 mg/dL 16  16  16    Creatinine 0.44 - 1.00 mg/dL 5.36  6.44  0.34   Sodium 135 - 145 mmol/L 139  140  139   Potassium 3.5 - 5.1 mmol/L 4.0  4.1  4.4   Chloride  98 - 111 mmol/L 106  108  109   CO2 22 - 32 mmol/L 28  27  24    Calcium 8.9 - 10.3 mg/dL 9.7  9.0  9.4   Total Protein 6.5 - 8.1 g/dL 6.4  6.3  6.1   Total Bilirubin <1.2 mg/dL 0.4  0.4  0.4   Alkaline Phos 38 - 126 U/L 63  82  76   AST 15 - 41 U/L 28  27  23    ALT 0 - 44 U/L 24  29  20        RADIOGRAPHIC STUDIES: I have personally reviewed the radiological images as listed and agreed with the findings in the report. No results found.    No orders of the defined types were placed in this encounter.  All questions were answered. The  patient knows to call the clinic with any problems, questions or concerns. No barriers to learning was detected. The total time spent in the appointment was 25 minutes.     Malachy Mood, MD 03/22/2023

## 2023-03-22 NOTE — Assessment & Plan Note (Signed)
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 S/p B/l mastectomies by Dr Luisa Hart and adjuvant radiation by Dr Mitzi Hansen -She started on Anastrozole in 04/2018, switched to exemestane in 08/2020 due to concerns with joint pain. Ultimately discontinued 07/2021 due to worsening joint pain from osteoarthritis.  -We previously discussed switching to tamoxifen, patient declined due to concern for side effects.  Her arthralgia did not improve after she came off exemestane, so she restarted exemestane in 01/2022. She declined Tamoxifen  -She will complete 5 years of aromatase inhibitor in July 2025, I discussed the benefit of extended adjuvant AI.  I recommend breast cancer index test to see if she would benefit from extended AI.  She agrees.

## 2023-03-28 ENCOUNTER — Other Ambulatory Visit: Payer: Self-pay

## 2023-03-28 NOTE — Progress Notes (Signed)
Breast Cancer Index order with backup documentation emailed to [email or web address] [phone]).  Receipt confirmation received.

## 2023-03-28 NOTE — Progress Notes (Signed)
Breast Cancer Index order confirmation received and assigned [id number] by [facility].

## 2023-04-24 ENCOUNTER — Encounter: Payer: Self-pay | Admitting: Hematology

## 2023-04-27 ENCOUNTER — Encounter: Payer: Self-pay | Admitting: Hematology

## 2023-05-02 ENCOUNTER — Other Ambulatory Visit: Payer: Self-pay | Admitting: Hematology

## 2023-09-21 ENCOUNTER — Inpatient Hospital Stay (HOSPITAL_BASED_OUTPATIENT_CLINIC_OR_DEPARTMENT_OTHER): Payer: Commercial Managed Care - PPO | Admitting: Nurse Practitioner

## 2023-09-21 ENCOUNTER — Other Ambulatory Visit: Payer: Self-pay | Admitting: Nurse Practitioner

## 2023-09-21 ENCOUNTER — Inpatient Hospital Stay: Payer: Commercial Managed Care - PPO | Attending: Hematology

## 2023-09-21 VITALS — BP 138/80 | HR 75 | Temp 97.8°F | Resp 17 | Wt 150.6 lb

## 2023-09-21 DIAGNOSIS — G8929 Other chronic pain: Secondary | ICD-10-CM | POA: Diagnosis not present

## 2023-09-21 DIAGNOSIS — C187 Malignant neoplasm of sigmoid colon: Secondary | ICD-10-CM

## 2023-09-21 DIAGNOSIS — C50511 Malignant neoplasm of lower-outer quadrant of right female breast: Secondary | ICD-10-CM

## 2023-09-21 DIAGNOSIS — N951 Menopausal and female climacteric states: Secondary | ICD-10-CM | POA: Diagnosis not present

## 2023-09-21 DIAGNOSIS — R197 Diarrhea, unspecified: Secondary | ICD-10-CM | POA: Insufficient documentation

## 2023-09-21 DIAGNOSIS — K449 Diaphragmatic hernia without obstruction or gangrene: Secondary | ICD-10-CM | POA: Insufficient documentation

## 2023-09-21 DIAGNOSIS — R232 Flushing: Secondary | ICD-10-CM | POA: Diagnosis not present

## 2023-09-21 DIAGNOSIS — D649 Anemia, unspecified: Secondary | ICD-10-CM

## 2023-09-21 DIAGNOSIS — Z1732 Human epidermal growth factor receptor 2 negative status: Secondary | ICD-10-CM | POA: Diagnosis not present

## 2023-09-21 DIAGNOSIS — M1990 Unspecified osteoarthritis, unspecified site: Secondary | ICD-10-CM | POA: Insufficient documentation

## 2023-09-21 DIAGNOSIS — I700 Atherosclerosis of aorta: Secondary | ICD-10-CM | POA: Insufficient documentation

## 2023-09-21 DIAGNOSIS — Z79899 Other long term (current) drug therapy: Secondary | ICD-10-CM | POA: Insufficient documentation

## 2023-09-21 DIAGNOSIS — K644 Residual hemorrhoidal skin tags: Secondary | ICD-10-CM | POA: Insufficient documentation

## 2023-09-21 DIAGNOSIS — I2510 Atherosclerotic heart disease of native coronary artery without angina pectoris: Secondary | ICD-10-CM | POA: Insufficient documentation

## 2023-09-21 DIAGNOSIS — D500 Iron deficiency anemia secondary to blood loss (chronic): Secondary | ICD-10-CM | POA: Insufficient documentation

## 2023-09-21 DIAGNOSIS — Z923 Personal history of irradiation: Secondary | ICD-10-CM | POA: Insufficient documentation

## 2023-09-21 DIAGNOSIS — R61 Generalized hyperhidrosis: Secondary | ICD-10-CM | POA: Diagnosis not present

## 2023-09-21 DIAGNOSIS — Z170 Estrogen receptor positive status [ER+]: Secondary | ICD-10-CM | POA: Insufficient documentation

## 2023-09-21 DIAGNOSIS — J984 Other disorders of lung: Secondary | ICD-10-CM | POA: Diagnosis not present

## 2023-09-21 DIAGNOSIS — K259 Gastric ulcer, unspecified as acute or chronic, without hemorrhage or perforation: Secondary | ICD-10-CM | POA: Diagnosis not present

## 2023-09-21 DIAGNOSIS — M85851 Other specified disorders of bone density and structure, right thigh: Secondary | ICD-10-CM | POA: Insufficient documentation

## 2023-09-21 DIAGNOSIS — N200 Calculus of kidney: Secondary | ICD-10-CM | POA: Insufficient documentation

## 2023-09-21 DIAGNOSIS — Z888 Allergy status to other drugs, medicaments and biological substances status: Secondary | ICD-10-CM | POA: Insufficient documentation

## 2023-09-21 DIAGNOSIS — R109 Unspecified abdominal pain: Secondary | ICD-10-CM | POA: Diagnosis not present

## 2023-09-21 DIAGNOSIS — Z79811 Long term (current) use of aromatase inhibitors: Secondary | ICD-10-CM | POA: Insufficient documentation

## 2023-09-21 DIAGNOSIS — K648 Other hemorrhoids: Secondary | ICD-10-CM | POA: Diagnosis not present

## 2023-09-21 DIAGNOSIS — Z1721 Progesterone receptor positive status: Secondary | ICD-10-CM | POA: Diagnosis not present

## 2023-09-21 DIAGNOSIS — Z9013 Acquired absence of bilateral breasts and nipples: Secondary | ICD-10-CM | POA: Insufficient documentation

## 2023-09-21 DIAGNOSIS — Z885 Allergy status to narcotic agent status: Secondary | ICD-10-CM | POA: Insufficient documentation

## 2023-09-21 LAB — CMP (CANCER CENTER ONLY)
ALT: 23 U/L (ref 0–44)
AST: 24 U/L (ref 15–41)
Albumin: 4.3 g/dL (ref 3.5–5.0)
Alkaline Phosphatase: 63 U/L (ref 38–126)
Anion gap: 6 (ref 5–15)
BUN: 13 mg/dL (ref 8–23)
CO2: 28 mmol/L (ref 22–32)
Calcium: 9.3 mg/dL (ref 8.9–10.3)
Chloride: 107 mmol/L (ref 98–111)
Creatinine: 0.81 mg/dL (ref 0.44–1.00)
GFR, Estimated: >60 mL/min (ref >60)
Glucose, Bld: 103 mg/dL — ABNORMAL HIGH (ref 70–99)
Potassium: 3.9 mmol/L (ref 3.5–5.1)
Sodium: 141 mmol/L (ref 135–145)
Total Bilirubin: 0.4 mg/dL (ref 0.0–1.2)
Total Protein: 6.7 g/dL (ref 6.5–8.1)

## 2023-09-21 LAB — CBC WITH DIFFERENTIAL (CANCER CENTER ONLY)
Abs Immature Granulocytes: 0.01 10*3/uL (ref 0.00–0.07)
Basophils Absolute: 0 10*3/uL (ref 0.0–0.1)
Basophils Relative: 1 %
Eosinophils Absolute: 0.1 10*3/uL (ref 0.0–0.5)
Eosinophils Relative: 2 %
HCT: 39.9 % (ref 36.0–46.0)
Hemoglobin: 13.7 g/dL (ref 12.0–15.0)
Immature Granulocytes: 0 %
Lymphocytes Relative: 21 %
Lymphs Abs: 0.9 10*3/uL (ref 0.7–4.0)
MCH: 31.4 pg (ref 26.0–34.0)
MCHC: 34.3 g/dL (ref 30.0–36.0)
MCV: 91.3 fL (ref 80.0–100.0)
Monocytes Absolute: 0.4 10*3/uL (ref 0.1–1.0)
Monocytes Relative: 8 %
Neutro Abs: 2.8 10*3/uL (ref 1.7–7.7)
Neutrophils Relative %: 68 %
Platelet Count: 257 10*3/uL (ref 150–400)
RBC: 4.37 MIL/uL (ref 3.87–5.11)
RDW: 12.2 % (ref 11.5–15.5)
WBC Count: 4.2 10*3/uL (ref 4.0–10.5)
nRBC: 0 % (ref 0.0–0.2)

## 2023-09-21 LAB — FERRITIN: Ferritin: 39 ng/mL (ref 11–307)

## 2023-09-21 NOTE — Progress Notes (Signed)
 Patient Care Team: Gerrianne Krauss, PA-C as PCP - General (Physician Assistant) Sim Dryer, MD as Consulting Physician (General Surgery) Magrinat, Rozella Cornfield, MD (Inactive) as Consulting Physician (Oncology) Johna Myers, MD as Consulting Physician (Radiation Oncology) Burton Casey, RN as Registered Nurse Annemarie Kil, MD as Referring Physician (Dermatology) Berna Breslow, RN (Inactive) as Oncology Nurse Navigator Sonja Dalhart, MD as Consulting Physician (Hematology) Melvenia Stabs, MD as Consulting Physician (General Surgery) Sonja Greenview, MD as Consulting Physician (Hematology) Elna Haggis, MD as Consulting Physician (Neurosurgery)  Clinic Day:  10/01/2023  Referring physician: Gerrianne Krauss, PA-C  ASSESSMENT & PLAN:   Assessment & Plan: Malignant neoplasm of lower-outer quadrant of right breast of female, estrogen receptor positive (HCC) -Stage IIA,pT2N1M0, ER+/PR+/HER2-, G3 -diagnosed in 09/2017. S/p B/l mastectomies by Dr Afton Horse and adjuvant radiation by Dr Jeryl Moris -She started on Anastrozole  in 04/2018, switched to exemestane  in 08/2020 due to concerns with joint pain. Ultimately discontinued 07/2021 due to worsening joint pain from osteoarthritis.  -We previously discussed switching to tamoxifen , patient declined due to concern for side effects.  Her arthralgia did not improve after she came off exemestane , so she restarted exemestane  in 01/2022. She declined Tamoxifen   -She will complete 5 years of aromatase inhibitor in July 2025. The benefit of extended adjuvant AI was discussed. Breast cancer index test was recommended to see if she would benefit from extended AI.  She agrees. -Breast cancer index testing indicated that she would benefit from extended therapy with AI exemestane .  Risk of recurrence/metastases in 10 years with 5 years of treatment is 18.7%.  With extended treatment with AI, risk of recurrence/metastases decreases to 6.2 to 7.8% over the next 10 years. -  Will continue exemestane  daily.  Iron  deficiency anemia due to chronic blood loss -She had a history of iron  deficient anemia in the past -She developed a significant anemia after her hip surgery 3 months ago, required 1 unit of blood transfusion. -Anemia workup from November 21, 2022 showed significantly low ferritin level, normal B12 and folate, low reticulocyte count, normal haptoglobin, no evidence of hemolysis.  This is consistent with iron  deficient anemia. -Her anemia has improved, but not consistent, she has been on oral iron .  I recommend IV iron , but the patient has poor IV access, and would like to try oral iron  first.  She will take over-the-counter ferric sulfate 2 tablets a day for a month, then changed to 1 tablet daily. -Monitor her CBC and iron  level closely - 09/21/2023 -CBC within normal limits.  Hgb 13.7 and HCT 39.9.  Ferritin normal at 39. -- Continue oral iron  daily. -- Recheck ferritin along with labs in 6 months.     Abdominal pain and diarrhea Started while she was on long-term antibiotics for lung infection.  See did mention this to her primary care who felt like antibiotics were causing diarrhea.  She is now off antibiotics and continues to have diarrhea.  We discussed the possibility that there are some conditions that are caused from treatment with antibiotics. Advised that she reach out to her primary care provider again to discuss stool and blood testing.  May require referral to GI for further evaluation.  Iron  deficiency anemia CBC unremarkable today.  Hgb 13. 7 and HCT 39.9.  Ferritin 39.  Continue oral iron  daily.  Recheck CBC and ferritin level at next visit in 6 months.  Plan: Labs reviewed. - CBC and CMP are stable and unremarkable. - Ferritin normal at 39. Reviewed breast cancer  index testing which indicates significant reduction in translation recurrent/metastases.  10 years treatment on AI.  Will continue exemestane  daily.  New prescription as  indicated. Labs and follow-up in 6 months, sooner if needed.   The patient understands the plans discussed today and is in agreement with them.  She knows to contact our office if she develops concerns prior to her next appointment.  I provided 25 minutes of face-to-face time during this encounter and > 50% was spent counseling as documented under my assessment and plan.    Sharyon Deis, NP  [facility] - A DEPT OF [facility] [address] [place] [address] Dept: [phone] Dept Fax: [phone]   No orders of the defined types were placed in this encounter.     CHIEF COMPLAINT:  CC: f/u breast cancer and iron  deficiency anemia   Current Treatment:  exemastane  INTERVAL HISTORY:  Nimo is here today for repeat clinical assessment.  She was last seen by Dr. Grayland Le in November 2024.  Had breast cancer index testing done.  Test showed significant benefit from extended therapy with AI over the next 10 years.  Extended treatment would decrease her risk from 18.7% of recurrence or metastases with 5 years of treatment compared to 6.2 to 7.8% chance of recurrence or metastases with 10 years of treatment with AI.  She does have mild menopausal symptoms related to exemestane  including hot flashes and night sweats.  These are manageable.  She does admit to chronic abdominal pain and diarrhea.  States she did have bacterial infection in her lungs and was on antibiotics for over a year.  Diarrhea started during the time that she was on antibiotics she saw primary care for this.  They felt that antibiotics were dark because of her diarrhea. She denies chest pain, chest pressure, or shortness of breath. She denies headaches or visual disturbances. She denies fevers or chills. Her appetite is fair. Her weight has decreased 4 pounds over last 6 months.  I have reviewed the past medical history, past surgical history, social  history and family history with the patient and they are unchanged from previous note.  ALLERGIES:  is allergic to cefoxitin, tigecycline, codeine, and heparin .  MEDICATIONS:  Current Outpatient Medications  Medication Sig Dispense Refill   ALPRAZolam  (NIRAVAM ) 0.5 MG dissolvable tablet Take 0.5 mg by mouth daily as needed (Sleep/Travel).     Calcium  Carb-Cholecalciferol  600-25 MG-MCG CAPS Take by mouth.     Cholecalciferol  (VITAMIN D ) 50 MCG (2000 UT) tablet Take 2,000 Units by mouth daily.     cyanocobalamin  (VITAMIN B12) 1000 MCG tablet Take 1,000 mcg by mouth daily. Chew     exemestane  (AROMASIN ) 25 MG tablet TAKE 1 TABLET (25 MG TOTAL) BY MOUTH DAILY AFTER BREAKFAST. 90 tablet 1   gabapentin  (NEURONTIN ) 300 MG capsule Take 300 mg by mouth daily.     Prenatal Vit-Fe Fumarate-FA (PRENATAL VITAMINS PLUS PO)      Probiotic Product (PROBIOTIC PO) Take 2 capsules by mouth daily. 800 Million CFU     tiZANidine (ZANAFLEX) 4 MG tablet TAKE 1/2 TO 1 TABLET AT BEDTIME AS NEEDED FOR SPASM     vitamin E  180 MG (400 UNITS) capsule Take 400 Units by mouth daily.     pantoprazole  (PROTONIX ) 20 MG tablet Take 20 mg by mouth daily.     No current facility-administered medications for this visit.    HISTORY OF  PRESENT ILLNESS:   Oncology History Overview Note  Cancer Staging Cancer of sigmoid colon [facility]) Staging form: Colon and Rectum, AJCC 8th Edition - Pathologic stage from 11/22/2019: Stage IIA (pT3, pN0, cM0) - Signed by Sonja [place], MD on 12/17/2019  Malignant neoplasm of lower-outer quadrant of right breast of female, estrogen receptor positive (HCC) Staging form: Breast, AJCC 8th Edition - Clinical: Stage IIB (cT2, cN1, cM0, G3, ER+, PR+, HER2-) - Unsigned - Pathologic: Stage IIA (pT2, pN1(sn), cM0, G3, ER+, PR+, HER2-) - Unsigned    Malignant neoplasm of lower-outer quadrant of right breast of female, estrogen receptor positive (HCC)  10/23/2017 Initial Diagnosis    right breast lower  outer quadrant biopsy 10/23/2017 for a clinical T2 N1, stage IIB invasive ductal carcinoma, grade 3, estrogen and progesterone receptor positive, HER-2 not amplified, with an MIB-1 of 20%   11/07/2017 Miscellaneous   Mammaprint on was read as low risk, predicting no significant chemotherapy benefit with a 5-year distant disease-free survival in the 97-98% range with hormone therapy alone   12/05/2017 Surgery   status post bilateral mastectomies on 12/05/2017 showing             (a) on the left, no evidence of malignancy             (b) on the right, a pT2 pN1, stage IIB invasive ductal carcinoma, grade 3, with negative margins. By Dr Afton Horse   01/15/2018 - 02/28/2018 Radiation Therapy   adjuvant radiation 01/15/2018 - 02/28/2018 with Dr Jeryl Moris              Site/dose: The patient initially received a dose of 50.4 Gy in 28 fractions to the chest wall and supraclavicular region. This was delivered using a 3-D conformal, 4 field technique. The patient then received a boost to the mastectomy scar. This delivered an additional 10 Gy in 5 fractions using an en face electron field. The total dose was 60.4 Gy.    04/01/2018 -  Anti-estrogen oral therapy   started anastrozole  in 04/2018. Switched to Exemestane  in 08/2020 due to joint pain.             (a) bone density 03/07/2018 with a T score of -1.8. Her 04/21/20 DEXA showed osteopenia -1.8 at right total femur, with low risk for fracture.   10/28/2019 Genetic Testing   Negative genetic testing.  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  The Common Hereditary Gene Panel offered by [facility] includes sequencing and/or deletion duplication testing of the following 48 genes: APC, ATM, AXIN2, BARD1, BMPR1A, BRCA1, BRCA2, BRIP1, CDH1, CDK4, CDKN2A (p14ARF), CDKN2A (p16INK4a), CHEK2, CTNNA1, DICER1, EPCAM (Deletion/duplication testing only), GREM1 (promoter region deletion/duplication testing only), KIT, MEN1, MLH1, MSH2, MSH3, MSH6,  MUTYH, NBN, NF1, NHTL1, PALB2, PDGFRA, PMS2, POLD1, POLE, PTEN, RAD50, RAD51C, RAD51D, RNF43, SDHB, SDHC, SDHD, SMAD4, SMARCA4. STK11, TP53, TSC1, TSC2, and VHL.  The following genes were evaluated for sequence changes only: SDHA and HOXB13 c.251G>A variant only. The report date is October 28, 2019.   03/08/2021 Imaging   CT Chest w/o contrast  IMPRESSION: 1. The previous FDG avid lung mass within the anterior right lower lobe has decreased in size compared with previous exam compatible with response to therapy. The cavitary lung nodule within the right apex is slightly increased in size from previous exam. Additionally, there is a new irregular nodule within the medial right upper lobe measuring 6 mm. Previously 3 mm. 2. Interval increase in size of cavitary nodule within  the right lung apex. This measures 2.3 x 1.5 cm, compared with 1.3 x 0.9 cm previously. Underlying malignancy cannot be excluded. 3. New small nodule within the right upper lobe measures 6 mm. This has a nonspecific appearance but warrants attention on follow-up imaging. 4. The remaining small pulmonary nodules noted previously are stable in the interval. 5. Coronary artery calcifications noted. 6. Aortic Atherosclerosis (ICD10-I70.0).   Cancer of sigmoid colon (HCC)  09/23/2019 Tumor Marker   Baseline CEA 1.4 on 09/23/19    09/23/2019 Procedure   Colonoscopy and Upper endoscopy with Dr Veronda Goody 09/23/19  Upper Endoscopy Impression - Z-line regular, 35 cm from the incisors. - Small hiatal hernia. - Non-bleeding gastric ulcer. Biopsied. - Hematin (altered blood/coffee-ground-like material) in the gastric body. - Normal duodenal bulb, first portion of the duodenum and second portion of - Normal duodenal bulb, first portion of the duodenum and second portion of the duodenum.  Colonoscopy Impression - Preparation of the colon was poor. - Perianal skin tags found on perianal exam. 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 Biopsied. Tattooed. - Internal hemorrhoids.   09/23/2019 Initial Biopsy   FINAL MICROSCOPIC DIAGNOSIS: 09/23/19 A. STOMACH, ULCER, BIOPSY:  - Gastric antral mucosa with mild nonspecific reactive gastropathy  - Warthin Starry stain is negative for Helicobacter pylori   B. COLON, RECTOSIGMOID, MASS, BIOPSY:  - Adenocarcinoma, see comment  COMMENT:   B.  Immunohistochemical stains for MMR-related proteins are pending and  will be reported in an addendum. Dr. Portia Brittle reviewed the case and  concurs with the diagnosis. Dr. Veronda Goody was notified on 09/24/2019.     ADDENDUM:  Mismatch Repair Protein (IHC)  SUMMARY INTERPRETATION: NORMAL  There is preserved expression of the major MMR proteins. There is a very  low probability that microsatellite instability (MSI) is present.  However, certain clinically significant MMR protein mutations may result  in preservation of nuclear expression. It is recommended that the  preservation of protein expression be correlated with molecular based  MSI testing.   IHC EXPRESSION RESULTS  TEST           RESULT  MLH1:          Preserved nuclear expression  MSH2:          Preserved nuclear expression  MSH6:          Preserved nuclear expression  PMS2:          Preserved nuclear expression    09/23/2019 Imaging   CT CAP W Contrast 09/23/19  IMPRESSION: 1. There is eccentric wall thickening of the rectosigmoid colon, in keeping with mass identified and biopsied by colonoscopy.   2. There are prominent subcentimeter lymph nodes in the sigmoid mesocolon, suspicious for nodal metastatic disease. There is no other lymphadenopathy in the abdomen or pelvis.   3. There are multiple small bilateral pulmonary nodules measuring up to 4 mm, nonspecific although very suspicious for pulmonary metastatic disease. Given history of breast malignancy, previous imaging of the chest would be very helpful to assess for stability. This report may be addended for  comparison if prior imaging can be obtained.   4. Status post bilateral mastectomy with irregular subpleural opacity of the anterior right upper lobe, consistent with history of breast malignancy and radiation fibrosis.   5. There is adjacent nodularity of the right pulmonary apex, the largest discrete nodular component measuring up to 1.1 cm. This is more favored infectious or inflammatory although nonspecific and metastatic disease is not excluded. As  as above, prior imaging would be very helpful to assess.   6. Small focus of nonspecific infectious or inflammatory airspace disease of the perihilar left upper lobe.   09/26/2019 Initial Diagnosis   Cancer of sigmoid colon (HCC)   10/28/2019 PET scan   IMPRESSION: 1. Increased tracer uptake within the pelvis corresponding to wall thickening of the rectosigmoid colon favored to represent primary colorectal carcinoma. No findings of FDG avid nodal metastasis or solid organ metastasis within the abdomen or pelvis. 2. Within the right lung apex there is a cluster of 3 subpleural nodular densities which exhibit varying degrees of mild FDG uptake with SUV max between 1.9 and 3.1. Morphologically the appearance is suggestive of benign postinflammatory pleuroparenchymal scarring. This would be an atypical pattern of metastatic disease, which is considered less favored, but not excluded. Attention in this area on serial follow-up imaging is advised. 3. Two small, less than 5 mm left lower lobe lung nodules are too small to reliably characterize. 4.  Aortic Atherosclerosis (ICD10-I70.0). 5. Nonobstructing right renal calculus.     11/22/2019 Cancer Staging   Staging form: Colon and Rectum, AJCC 8th Edition - Pathologic stage from 11/22/2019: Stage IIA (pT3, pN0, cM0) - Signed by Sonja Winthrop, MD on 12/17/2019   11/22/2019 Surgery   XI ROBOT ASSISTED SIGMOIDECTOMY and FLEXIBLE SIGMOIDOSCOPY by Dr white and Dr Andy Bannister   11/22/2019 Pathology  Results   FINAL MICROSCOPIC DIAGNOSIS:   A. SIGMOID COLON, RESECTION:  - Invasive moderately differentiated adenocarcinoma, 4.5 cm, involving distal sigmoid colon  - Carcinoma focally invades into pericolonic soft tissue  - Resection margins are negative for carcinoma  - Negative for lymphovascular or perineural invasion  - Eighteen benign lymph nodes (0/18)  - See oncology table   B. FINAL DISTAL MARGIN, EXCISION:  - Colonic donut, negative for carcinoma    04/15/2020 Imaging   CT Chest  IMPRESSION: 1. New nodular 1.9 cm focus of consolidation in the anterior right lower lobe with surrounding mild patchy tree-in-bud opacity, nonspecific but favoring infectious/inflammatory etiology. 2. Clustered nodularity in the apical right upper lobe is stable in size and newly cavitary. These findings are indeterminate for atypical infection such as MAI versus response to therapy of pulmonary metastases. Additional scattered tiny left pulmonary nodules are stable. Suggest close chest CT follow-up in 3 months. 3. No thoracic adenopathy. 4. Stable dilated main pulmonary artery, suggesting chronic pulmonary arterial hypertension. 5. Aortic Atherosclerosis (ICD10-I70.0).   04/17/2020 Tumor Marker   Her Guardant Reveal showed negative - ctDNA not detected.    07/08/2020 Imaging   CT Chest  IMPRESSION: Slight reduction in size of hypermetabolic right upper lobe pulmonary nodule, nonspecific though favored to represent area of infection/inflammation as was initially suggested on chest CT performed 04/15/2020. No biopsy attempted at this time.   03/08/2021 Imaging   CT Chest w/o contrast  IMPRESSION: 1. The previous FDG avid lung mass within the anterior right lower lobe has decreased in size compared with previous exam compatible with response to therapy. The cavitary lung nodule within the right apex is slightly increased in size from previous exam. Additionally, there is a new irregular  nodule within the medial right upper lobe measuring 6 mm. Previously 3 mm. 2. Interval increase in size of cavitary nodule within the right lung apex. This measures 2.3 x 1.5 cm, compared with 1.3 x 0.9 cm previously. Underlying malignancy cannot be excluded. 3. New small nodule within the right upper lobe measures 6 mm. This has a nonspecific  appearance but warrants attention on follow-up imaging. 4. The remaining small pulmonary nodules noted previously are stable in the interval. 5. Coronary artery calcifications noted. 6. Aortic Atherosclerosis (ICD10-I70.0).   04/04/2022 Imaging    IMPRESSION: 1. Cavitary right apical lesion has become confluent in the interval, measuring minimally smaller at 3.1 x 2.1 cm compared to 3.2 x 2.8 cm previously. 2. Stable left lower lobe pulmonary nodules measuring up to 6 mm. 3. Enlargement of the pulmonary outflow tract/main pulmonary arteries suggests pulmonary arterial hypertension. 4.  Aortic Atherosclerosis (ICD10-I70.0).   10/21/2022 Imaging    IMPRESSION: No evidence of recurrent or metastatic carcinoma within the abdomen or pelvis.   Tiny right renal calculus. No evidence of ureteral calculi or hydronephrosis.   Large stool burden noted; recommend correlation for symptoms or signs of constipation.   Stable sub-cm left lower lobe pulmonary nodules. Recommend continued attention on follow-up imaging.         REVIEW OF SYSTEMS:   Constitutional: Denies fevers, chills or abnormal weight loss.  Eyes: Denies blurriness of vision Ears, nose, mouth, throat, and face: Denies mucositis or sore throat Respiratory: Denies cough, dyspnea or wheezes Cardiovascular: Denies palpitation, chest discomfort or lower extremity swelling Gastrointestinal: She has intermittent abdominal pain and diarrhea.  Started while she was on antibiotics for lung infection. Skin: Denies abnormal skin rashes Lymphatics: Denies new lymphadenopathy or easy  bruising Neurological:Denies numbness, tingling or new weaknesses Behavioral/Psych: Mood is stable, no new changes  All other systems were reviewed with the patient and are negative.   VITALS:   Today's Vitals   09/21/23 1312 09/21/23 1317  BP: 138/80   Pulse: 75   Resp: 17   Temp: 97.8 F (36.6 C)   SpO2: 97%   Weight: 150 lb 9.6 oz (68.3 kg)   PainSc:  0-No pain   Body mass index is 26.68 kg/m.  Wt Readings from Last 3 Encounters:  09/21/23 150 lb 9.6 oz (68.3 kg)  03/22/23 154 lb 1.6 oz (69.9 kg)  01/26/23 148 lb (67.1 kg)    Body mass index is 26.68 kg/m.  Performance status (ECOG): 1 - Symptomatic but completely ambulatory  PHYSICAL EXAM:   GENERAL:alert, no distress and comfortable SKIN: skin color, texture, turgor are normal, no rashes or significant lesions EYES: normal, Conjunctiva are pink and non-injected, sclera clear OROPHARYNX:no exudate, no erythema and lips, buccal mucosa, and tongue normal  NECK: supple, thyroid  normal size, non-tender, without nodularity LYMPH:  no palpable lymphadenopathy in the cervical, axillary or inguinal LUNGS: clear to auscultation and percussion with normal breathing effort HEART: regular rate & rhythm and no murmurs and no lower extremity edema ABDOMEN:abdomen soft, non-tender and normal bowel sounds Musculoskeletal:no cyanosis of digits and no clubbing  NEURO: alert & oriented x 3 with fluent speech, no focal motor/sensory deficits BREAST: Bilateral vasectomy scars well-healed.  No palpable masses or lumps in the chest wall.  No axillary lymphadenopathy, bilaterally.  LABORATORY DATA:  I have reviewed the data as listed    Component Value Date/Time   NA 141 09/21/2023 1246   K 3.9 09/21/2023 1246   CL 107 09/21/2023 1246   CO2 28 09/21/2023 1246   GLUCOSE 103 (H) 09/21/2023 1246   BUN 13 09/21/2023 1246   CREATININE 0.81 09/21/2023 1246   CREATININE 0.75 09/14/2022 0355   CALCIUM  9.3 09/21/2023 1246   PROT 6.7  09/21/2023 1246   ALBUMIN  4.3 09/21/2023 1246   AST 24 09/21/2023 1246   ALT 23 09/21/2023 1246  ALKPHOS 63 09/21/2023 1246   BILITOT 0.4 09/21/2023 1246   GFRNONAA >60 09/21/2023 1246   GFRAA >60 12/18/2019 1026   GFRAA >60 10/21/2019 0844    Lab Results  Component Value Date   WBC 4.2 09/21/2023   NEUTROABS 2.8 09/21/2023   HGB 13.7 09/21/2023   HCT 39.9 09/21/2023   MCV 91.3 09/21/2023   PLT 257 09/21/2023   Iron /TIBC/Ferritin/ %Sat    Component Value Date/Time   IRON  64 09/21/2019 1430   TIBC 395 09/21/2019 1430   FERRITIN 39 09/21/2023 1246   IRONPCTSAT 16 09/21/2019 1430    e

## 2023-10-01 ENCOUNTER — Encounter: Payer: Self-pay | Admitting: Hematology

## 2023-10-01 ENCOUNTER — Encounter: Payer: Self-pay | Admitting: Nurse Practitioner

## 2023-10-01 NOTE — Assessment & Plan Note (Addendum)
-  She had a history of iron  deficient anemia in the past -She developed a significant anemia after her hip surgery 3 months ago, required 1 unit of blood transfusion. -Anemia workup from November 21, 2022 showed significantly low ferritin level, normal B12 and folate, low reticulocyte count, normal haptoglobin, no evidence of hemolysis.  This is consistent with iron  deficient anemia. -Her anemia has improved, but not consistent, she has been on oral iron .  I recommend IV iron , but the patient has poor IV access, and would like to try oral iron  first.  She will take over-the-counter ferric sulfate 2 tablets a day for a month, then changed to 1 tablet daily. -Monitor her CBC and iron  level closely - 09/21/2023 -CBC within normal limits.  Hgb 13.7 and HCT 39.9.  Ferritin normal at 39. -- Continue oral iron  daily. -- Recheck ferritin along with labs in 6 months.

## 2023-10-01 NOTE — Assessment & Plan Note (Addendum)
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 S/p B/l mastectomies by Dr Afton Horse and adjuvant radiation by Dr Jeryl Moris -She started on Anastrozole  in 04/2018, switched to exemestane  in 08/2020 due to concerns with joint pain. Ultimately discontinued 07/2021 due to worsening joint pain from osteoarthritis.  -We previously discussed switching to tamoxifen , patient declined due to concern for side effects.  Her arthralgia did not improve after she came off exemestane , so she restarted exemestane  in 01/2022. She declined Tamoxifen   -She will complete 5 years of aromatase inhibitor in July 2025. The benefit of extended adjuvant AI was discussed. Breast cancer index test was recommended to see if she would benefit from extended AI.  She agrees. -Breast cancer index testing indicated that she would benefit from extended therapy with AI exemestane .  Risk of recurrence/metastases in 10 years with 5 years of treatment is 18.7%.  With extended treatment with AI, risk of recurrence/metastases decreases to 6.2 to 7.8% over the next 10 years. - Will continue exemestane  daily.

## 2023-10-19 ENCOUNTER — Encounter: Payer: Self-pay | Admitting: Hematology

## 2023-10-25 ENCOUNTER — Encounter: Payer: Self-pay | Admitting: Hematology

## 2023-10-26 ENCOUNTER — Other Ambulatory Visit: Payer: Self-pay

## 2023-10-26 ENCOUNTER — Encounter: Payer: Self-pay | Admitting: Internal Medicine

## 2023-10-26 ENCOUNTER — Ambulatory Visit: Payer: Commercial Managed Care - PPO | Admitting: Internal Medicine

## 2023-10-26 VITALS — BP 129/75 | HR 72 | Resp 16 | Ht 63.0 in | Wt 149.0 lb

## 2023-10-26 DIAGNOSIS — A319 Mycobacterial infection, unspecified: Secondary | ICD-10-CM

## 2023-10-26 DIAGNOSIS — R197 Diarrhea, unspecified: Secondary | ICD-10-CM

## 2023-10-26 LAB — BASIC METABOLIC PANEL WITH GFR
BUN: 14 mg/dL (ref 7–25)
CO2: 27 mmol/L (ref 20–32)
Calcium: 9.1 mg/dL (ref 8.6–10.4)
Chloride: 105 mmol/L (ref 98–110)
Creat: 0.77 mg/dL (ref 0.60–1.00)
Glucose, Bld: 97 mg/dL (ref 65–99)
Potassium: 4.1 mmol/L (ref 3.5–5.3)
Sodium: 141 mmol/L (ref 135–146)
eGFR: 82 mL/min/{1.73_m2} (ref >=60)

## 2023-10-26 LAB — CBC
HCT: 40.9 % (ref 35.0–45.0)
Hemoglobin: 13.2 g/dL (ref 11.7–15.5)
MCH: 30.8 pg (ref 27.0–33.0)
MCHC: 32.3 g/dL (ref 32.0–36.0)
MCV: 95.6 fL (ref 80.0–100.0)
MPV: 10.3 fL (ref 7.5–12.5)
Platelets: 243 10*3/uL (ref 140–400)
RBC: 4.28 10*6/uL (ref 3.80–5.10)
RDW: 11.7 % (ref 11.0–15.0)
WBC: 4.3 10*3/uL (ref 3.8–10.8)

## 2023-10-26 NOTE — Progress Notes (Addendum)
 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 Was supposed to be seen several months prior but didn't show.    08/31/21 underwent bronch: The distinct navigation pathways prepared prior to this procedure were then utilized to navigate to patient's lesion identified on CT scan. The robotic catheter was secured into place and the vision probe was withdrawn.  Lesion location was approximated using fluoroscopy and radial endobronchial ultrasound for peripheral targeting. Under fluoroscopic guidance transbronchial needle brushings, transbronchial needle biopsies, and transbronchial forceps biopsies were performed to be sent for cytology and pathology. A bronchioalveolar lavage was performed in the  right upper lobe and sent for microbiology.  Additional transbronchial biopsies sent for tissue culture.   At the end of the procedure a general airway inspection was performed and there was no evidence of active bleeding. The bronchoscope was removed.  The patient tolerated the procedure well. There was no significant blood loss and there were no obvious complications. A post-procedural chest x-ray is pending.  Cytology didn't see cancer cells  On micro -- something is growing that looks like ntm and the MALDI technology calls it abscessus but it was sent on 5/8th to reference lab to identify   Patient without respiratory sx outside of stable mild dyspnea on exertion 4-5 months. She would encounter this walking up a hill No b sx Has watery eyes and a recent dry cough but query if it's allergy No reflux No decreased appetite No weight loss   11/04/21 id clinic f/u Patient is back for f/u post susceptibility testing which show azithromycin /amikacin sensitive m abscessus She continues to feel well without b sx, cough, dyspnea, chest pain She is accompanied by her significant others today  12/28/21 id clinic f/u Patient developed itchy rash about 10 days ago involving trunk/ext. Takes benadryl  and topical hydrocortisone. 2 benadryls to help sleep. Also takes claritin. Rash is getting worse Opat labs elevated lft but improving (moderate in 100s), without eosinophilia Amikacin trough 8/24 is 0.8 with creatinine 0.98 No fever chill Occasional cough Went to ed 8/08 for sob after extreme exertion and chest ct was done -- no pe. Improved cavitation.  No joint pain/muscle aching No new vision change; she endorses having cataracts Has chronic leg cramps which she has been  using tonic water  for 6 months. Question of some tingling in hands/feet very mild  At some point she has recurrent elevation ak trough and cr and we had planned to substitute amikacin with linezolid  but she done well as above  with AK....  01/21/22 id clinic f/u Cefoxitin was hold first week of 12/2021 Her rash is much better She does at one point had yeast vaginal infection and thrush about 2 weeks ago (she got fluconazole for vaginal yeast dose and some other topical gel otc for her mouth). She still feel some taste change on her tongue/feels raw as well with hot temperature especially No visual changes No numbness/tingling hands feet Current regimen: Amikacin Azith Clofazimine Linezolid  (Doxy) Waiting for omadocycline approval again from insurance No n/v/diarrhea Rare cough Stable short of breath  She takes b6 daily for a while now  She had submitted 3 afb sputum but said the sputum are mostly upper oral. She can't get sputum up   02/08/22 id clinic f/u Patient last took abx a week ago. She was on tigycline at that time and had severe intolerable nausea/vomiting The doxycycline  is out of the question is it should never be used to m-abscessus  She is ready to start again  03/16/22 id clinic f/u Patient started on amikacin, omado, clofaz, and azithromycin  within a few days of 02/08/22 visit She is doing fine on those without any side effect Cough minimal/stable No b sx No weight loss -- weight gain actually No dyspnea Will have body ct to f/u breast cancer this December Can't get sputum up    06/16/22 id clinic f/u Had bronch mid jan and afb cx negative Chest ct 04/2022 showed stable cavitary changes sign but had become confluent No f/c/nightsweat, cough Not able to get up any sputum Reviewed opat labs. Normal creatinine and crp No n/v/diarrhea No rash  Skin is more pigmented and she asked why    09/14/22 id clinic f/u Had elective right hip arthroplasty 07/12/22. Not able to fully bear weight yet but improving. The incision had not fully closed -- there is one area about 1 cm along the incision inferiorly that is gold color on bandage. Size smaller. No pus, fever, chill. She is  following up with [facility]; 2 weeks prior to this visit there was a probe into the wound opening and testing was sent but she is not sure what was tested. No new abx She is tolerating clofaz/omado/clarithro No n/v/diarrhea No rash No cough She has seasonal allergy spring and fall bad 3 weeks prior developed pressure sensation in her head and had muffled hearing since. No facial pain. No purulent discharge from nose.  No teeth pain. She is being referred to ent Intermittent dry cough with her allergy     10/26/23 id clinic visit See a&P   Review of Systems: ROS All other ros negative      Past Medical History:  Diagnosis Date   Allergies 1995   Per patient report 11/14/18   Anemia    prior to finding colon cancer, no longer any issues with this per patient   Anxiety    Uses for sleep and long car rides   Arthritis    Breast cancer (HCC)    Breast cancer, right breast (HCC) 09/2017   S/P mastectomy 12/05/2017   Colon cancer (HCC)    COPD (chronic obstructive pulmonary disease) (HCC)    pt reports previous MD diagnosed her with this, but takes no medications  Dyspnea    occasionally with exertion   Family history of breast cancer    Headache 1968   per patient report, started as a teenager   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   Mycobacterium infection    lung   Osteopenia 03/2018   Seen on DEXA Scan   PONV (postoperative nausea and vomiting)     Social History   Tobacco Use   Smoking status: Never   Smokeless tobacco: Never  Vaping Use   Vaping status: Never Used  Substance Use Topics   Alcohol use: Yes    Alcohol/week: 7.0 standard drinks of alcohol    Types: 7 Glasses of wine per week    Comment: 2-3 3oz glasses of wine per day   Drug use: Never    Family History  Problem Relation Age of Onset   Dementia Mother    Lung cancer  Father        smoked   Emphysema Father    Heart disease Maternal Grandfather    Cancer Maternal Aunt 8       breast cancer    Cancer Cousin        maternal first cousin with breast cancer in her 68s    Allergies  Allergen Reactions   Cefoxitin Rash   Tigecycline Diarrhea and Nausea And Vomiting   Codeine Nausea And Vomiting and Other (See Comments)    headache   Heparin  Rash    Pt states she tolerates heparin  flush for PICC but gets a full body rash with drip    OBJECTIVE: Vitals:   10/26/23 1316  BP: 129/75  Pulse: 72  Resp: 16  Weight: 149 lb (67.6 kg)  Height: 5' 3 (1.6 m)    Body mass index is 26.39 kg/m.   Physical Exam General/constitutional: no distress, pleasant HEENT: Normocephalic, PER, Conj Clear, EOMI, Oropharynx clear -- ear canals with minimal dry ear wax but normal appearing TM lining without evidence of fluid behind tm Neck supple CV: rrr no mrg Lungs: clear to auscultation, normal respiratory effort Abd: Soft, Nontender Ext: no edema Skin: No Rash Neuro: nonfocal MSK: no peripheral joint swelling/tenderness/warmth; back spines nontender         Lab: Lab Results  Component Value Date   WBC 4.2 09/21/2023   HGB 13.7 09/21/2023   HCT 39.9 09/21/2023   MCV 91.3 09/21/2023   PLT 257 09/21/2023   Last metabolic panel Lab Results  Component Value Date   GLUCOSE 103 (H) 09/21/2023   NA 141 09/21/2023   K 3.9 09/21/2023   CL 107 09/21/2023   CO2 28 09/21/2023   BUN 13 09/21/2023   CREATININE 0.81 09/21/2023   GFRNONAA >60 09/21/2023   CALCIUM  9.3 09/21/2023   PHOS 3.6 11/14/2018   PROT 6.7 09/21/2023   ALBUMIN  4.3 09/21/2023   BILITOT 0.4 09/21/2023   ALKPHOS 63 09/21/2023   AST 24 09/21/2023   ALT 23 09/21/2023   ANIONGAP 6 09/21/2023    Microbiology:  Serology:  Imaging: I have reviewed the imaging personally and incorporated into decision making  08/06/21 chest abd pelv ct with contrast FINDINGS: CT CHEST  FINDINGS   Cardiovascular: Aortic atherosclerosis. Normal heart size. No pericardial effusion.   Mediastinum/Nodes: No enlarged mediastinal, hilar, or axillary lymph nodes. Thyroid  gland, trachea, and esophagus demonstrate no significant findings.   Lungs/Pleura: Interval increase in wall  thickening and cavitation within the right pulmonary apex, overall lesion now confluent and measuring 3.8 x 3.1 cm (series 4, image 24). Increased clustered centrilobular nodularity and small consolidations in this vicinity (series 4, image 37). Residual bandlike scarring in the bilateral lung bases (series 4, image 109, 122). Occasional small nodules of the left lower lobe measuring up to 0.5 cm (series 4, image 21). No pleural effusion or pneumothorax.   Musculoskeletal: No chest wall mass or suspicious osseous lesions identified. Status post bilateral mastectomy.   CT ABDOMEN PELVIS FINDINGS   Hepatobiliary: No solid liver abnormality is seen. No gallstones, gallbladder wall thickening, or biliary dilatation.   Pancreas: Unremarkable. No pancreatic ductal dilatation or surrounding inflammatory changes.   Spleen: Normal in size without significant abnormality.   Adrenals/Urinary Tract: Adrenal glands are unremarkable. Punctuate nonobstructive calculus in the inferior pole of the right kidney (series 5, image 89). No left-sided calculi, ureteral calculi, or hydronephrosis. Bladder is unremarkable.   Stomach/Bowel: Stomach is within normal limits. Appendix is not clearly visualized and may be surgically absent. The cecum is anterior to the liver. Status post sigmoid colon resection and reanastomosis. No evidence of bowel wall thickening, distention, or inflammatory changes.   Vascular/Lymphatic: Aortic atherosclerosis. No enlarged abdominal or pelvic lymph nodes.   Reproductive: No mass or other abnormality.   Other: No abdominal wall hernia or abnormality. No ascites.    Musculoskeletal: No acute osseous findings.   IMPRESSION: 1. Status post sigmoid colon resection and reanastomosis. No evidence of lymphadenopathy or metastatic disease in the chest, abdomen, or pelvis. 2. Interval increase in wall thickening and cavitation within the right pulmonary apex, overall lesion now confluent and measuring 3.8 x 3.1 cm. Increased clustered centrilobular nodularity and small consolidations in this vicinity. Findings are consistent with ongoing, worsened cavitary infection. 3. Additional small nodules of the left lower lobe measuring up to 0.5 cm, unchanged and almost certainly benign, incidental sequelae of infection inflammation. 4. Nonobstructive right nephrolithiasis.   12/07/21  chest cta 1. No pulmonary embolism. No acute intrathoracic pathology identified. 2. Slight interval decrease in size of a cavitary lesion within the right upper lobe in keeping with at least partial response to therapy. Repeat imaging in 6-12 months would be helpful in confirming stability or resolution exclude the possibility of an underlying cavitating malignancy 3. Stable 5 mm subpleural pulmonary nodule within the left lower lobe, safely considered benign.   Assessment/plan: Problem List Items Addressed This Visit   None Visit Diagnoses       Diarrhea, unspecified type    -  Primary   Relevant Orders   Basic Metabolic Panel (BMET)   CBC   C. difficile GDH and Toxin A/B     Atypical mycobacterial infection         Abx: 10/10 - 01/23/23 omadocycline, clofaz, azith  02/08/22-06/16/22 amikacin (9/7-10/03 doxy) 9/6-10/03 linezolid  7/6-10/03 amikacin 7/6-10/03 azith 7/6-10/03 clofazimine  7/6-9/07 cefoxitin    Discussed natural history, epidimiology, pathogenesis, diagnosis criteria and treatment indication for ntm lungs  Discussed resistant nature of abscessus, cure rate, relapse rate  At this time she is completely assymptomatic but imaging has indicated  progression of cavitary lesion. On cytology there was no malignant cells. She had hx of colon cancer and breast cancer at least 3 years prior to this admission, and all are in remission  At this time culture is still pending final ID confirmation and susceptibility testing  I would treat her based on progression of ct scan  Will  await susceptibility and follow up with her in around 4-6 weeks to decide on treatment regimen    7/6 assessment Doing well Due to cavitary progression will do 2 iv's and 2 po's  Initial regimen will be iv amikacin, cefoxitin and po clofazimine and azithromycin   The initial regimen will be at least 1-3 months as long as she can tolerate.  After the 3 months potentially we can keep 1 iv and the 2 PO's or transition her iv's to inhaled amikacin with the 2 po's  Our pharmacist team speaks with her today. Will start clofaz and azith today  Once picc is in she can start the cefoxitin continuous daily infusion and tiw amikacin   Repeat chest ct in 6-8 weeks Repeat afb sputum x3 1 month after starting medication   8/29 assessment Ct improved on 8/8 Rash ?cefoxitin. No sign of DRESS or arthus reaction otherwise. Will look in to the newer tetracycline along with bedaquilline for her. Can continue as is for now her current drug regimen 3 sputum early morning ordered for afb testing -- discussed to do it before eating/drinking/brushing/gargling Repeat chest ct in 3-6 months   Discussed with our id pharmacy team   ----------- 01/21/22 id assessment For some reason insurance is making it difficult in getting omadocycline approved. They want her to try doxycycline  first  There is no evidence doxycycline  works for [employer], and omadocycline or tigecycline would need to be the back bone of treatment (tigecycline of course much more adverse side effect).  Our pharmacist is trying to get this approved with insurance. Twice now, awaiting result  She is on  amikacin, azith, clofazimine, linezolid  now along with doxy (as requested by the insurance).  Cefoxitin stopped due to rash thought to be related to it.  She is tolerating it  Follow up with me in 4-6 weeks  I will ask dr Brenna about repeating a bronch in 3-6 months to get sputum again as she can't get good sputum by herself. I am not confident I can trust her expectorated sputum for sterility check  Will plan on repeat chest ct in another 3 months and see if dr Brenna could help get some sputum in 3-6 months  02/08/22 id assessment Patient ready to start abx again. Couldn't tolerate tigecycline Iv omadocycline is covered by her insurance. They had several times unreasonably and illegally denied oral omadocycline  Will resume amikacin and omadocycline iv, with oral azithromycin  and clofazimine  Plan 3 months iv then transition to oral hopefully by that time her sputum has sterilization of m-abscessus (so around 05/2022)  03/16/22 id assessment Patient to arrange body ct with her oncologist this December She'll be at 3 months of current regimen for m-abscessus by 05/2022 which I think she should have sputum assessment again I'll ask dr Brenna to do bronchoscopy to get afb sputum culture at that time (around end of December or early 05/2022 F/u with me after 4-6 weeks after bronchoscopy done so we can decide if we need to drop one of the iv antibiotics   06/16/22 id assessment Doing well Ct chest would say fairly stable 05/2022 bronch sample afb culture negative She can't get sputum so will have to repeat bronch in 08/2022 and need at least a year of negative sample before we can stop all antibiotics  For now we can stop amikacin Will continue iv omado, oral clofaz and azithromycin   Will check with our pharmacy team to see if transitioning from iv to  PO omadocycline an option (insurance issue). And will also see if inhaled amikacin liposomal an option  Will let dr icard know to bronch er  in 08/2022 Chest ct in 4-6 weeks  See me 4 weeks after bronchoscopy   09/14/22 id assessment We are about due for repeat sputum afb culture which she'll need bronchoscopy due to inability to get expectorated sputum. She'll follow up with dr Brenna to get this done by June 2024 Right hip arthroplasty 3/15 with still not fully healed incision -- not sure if there is concern for hardware take issue or infection; I asked patient to get record and any recent swab testing done of the wound Continue current 3 antibiotics clofaz/azith/omadocycline Routine blood test for abx monitoring F/u with me 2-4 weeks after egd is done  Sign of eustachian tube dysfunction pending ent evaluation. Will send chart to our pharmacy team too to inquire about abx side effect but I do not think that's the case   Chart to be sent to dr Icard asking for bronchoscopy/afb cx by 10/2022   ----------- Addendum 09/15/22 received records from ortho 4/24 swab cx of the wound opening on the right hip --> no organisms seen; rare wbc; cx negative 4/24 sed rate 11; crp < 3 (<8)    11/24/22 id clinic assessment She went off azithromycin  during may visit with me and continued on omadocycline/clofazimine End of 08/2022 linezolid  added but she only took 2 weeks as she felt nauseous.  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  Her hip surgery had recovered.   Her hearing hasn't changed since stopping azithromycin     At this time, oral options to make it a 3 drug regimen seems limited. She doesn't want to add back iv antibiotics. I do not want to start her on arikayce in the setting of hearing loss  She mentions quality of life is more important   I would wait before adding any other antibiotics at this time, pending the bronchoscopy which  is in early 12/2022  I will also refer her to [facility] to have second opinion on treatment plan   I will see her early 01/2023  I would also want to repeat sputum/chest ct early next year   01/26/23 id clinic assessment She saw Dr Florian at [facility]. She was very happy about the experience there I reviewed dr Stout's assessment. I agree and also at this time all cultures on treatment have remained negative. Sx wise not mch change and she still has hearing loss which I am unclear where it comes from; still not quite convinced the azithromycin  did it   Eitherway we all agree to focus on quality of life and stop abx now short of the recommended duration based on pulm ntm guideline  I'll see her before summer 2025 to see how she is doing  No plan at this time to repeat labs/imaging unless she is very symptomatic  She is to see us  sooner than then if dyspea, cough, b sx, weight loss  Will copy this to dr Brenna as well    10/26/23 id clinic assessment Patient returns for a post-abx cessation clinical evaluation She had hearing loss and overall didn't feel being on abx improve her quality of life so had stopped antibiotics 01/2023 She had seen ent  again and the hearing had improved based on testing but still not baseline  No difference in function status or respiratory sx   Reports today that she has been having intermittent diarrhea which the pace from monthly to weekly to daily over the last 3 weeks (daily); diarrhea started since fall 2025. Appetite remains fine. Feels bloated. No pain. Did have alternating solid stool up to 6 months ago. But again the last month just watery diarrhea. Up to 8 stools a day -- no modifying factors. Only occasionally waking her up at night  No fever/chill/bloody stool. No gi evaluation  Hx colon cancer and partial collectomy  No weight loss   -cdiff testing -continue off ayptical mycobacteria infection treatment -no plan to repeat chest ct  -- will do that if symptomatic -if cdiff testing is positive will treat; if negative will refer to GI for chronic diarrhea evaluation -for pulmonary ID issue follow up with me next year otherwise -encourage patient to follow as well with pulm once a year to at least if we need pft to access progression of NTM   --------------- Addendum Negative cdiff testing Gi referral for chronic diarrhea placed 10/31/23   Follow-up: Return in about 10 days (around 11/05/2023).  Constance ONEIDA Passer, MD [facility] 10/26/2023, 1:36 PM

## 2023-10-26 NOTE — Patient Instructions (Signed)
 Let's check blood test and stool test for c diff colitis today   If positive will put you on vancomycin  pill for 14 days. If it's negative will have to refer you to GI for evaluation   For diarrhea do video visit with me in 10 days   For the lung issue: See me in 1 more year Also see pulmonary clinic (dr Lonna Casey) for follow up as well

## 2023-10-27 ENCOUNTER — Other Ambulatory Visit: Payer: Self-pay

## 2023-10-27 ENCOUNTER — Other Ambulatory Visit

## 2023-10-27 DIAGNOSIS — R197 Diarrhea, unspecified: Secondary | ICD-10-CM

## 2023-10-27 NOTE — Addendum Note (Signed)
 Addended by: GRETEL TULLY HERO on: 10/27/2023 10:17 AM   Modules accepted: Orders

## 2023-10-30 LAB — C. DIFFICILE GDH AND TOXIN A/B
GDH ANTIGEN: NOT DETECTED
MICRO NUMBER:: 16636006
SPECIMEN QUALITY:: ADEQUATE
TOXIN A AND B: NOT DETECTED

## 2023-10-31 ENCOUNTER — Ambulatory Visit: Payer: Self-pay | Admitting: Internal Medicine

## 2023-10-31 NOTE — Addendum Note (Signed)
 Addended by: OVERTON FAITH T on: 10/31/2023 04:36 PM   Modules accepted: Orders

## 2023-11-06 ENCOUNTER — Other Ambulatory Visit: Payer: Self-pay

## 2023-11-06 ENCOUNTER — Telehealth: Admitting: Internal Medicine

## 2023-11-06 DIAGNOSIS — K529 Noninfective gastroenteritis and colitis, unspecified: Secondary | ICD-10-CM

## 2023-11-06 DIAGNOSIS — A318 Other mycobacterial infections: Secondary | ICD-10-CM | POA: Diagnosis not present

## 2023-11-06 NOTE — Progress Notes (Addendum)
 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 Was supposed to be seen several months prior but didn't show.    08/31/21 underwent bronch: The distinct navigation pathways prepared prior to this procedure were then utilized to navigate to patient's lesion identified on CT scan. The robotic catheter was secured into place and the vision probe was withdrawn.  Lesion location was approximated using fluoroscopy and radial endobronchial ultrasound for peripheral targeting. Under fluoroscopic guidance transbronchial needle brushings, transbronchial needle biopsies, and transbronchial forceps biopsies were performed to be sent for cytology and pathology. A bronchioalveolar lavage was performed in the  right upper lobe and sent for microbiology.  Additional transbronchial biopsies sent for tissue culture.   At the end of the procedure a general airway inspection was performed and there was no evidence of active bleeding. The bronchoscope was removed.  The patient tolerated the procedure well. There was no significant blood loss and there were no obvious complications. A post-procedural chest x-ray is pending.  Cytology didn't see cancer cells  On micro -- something is growing that looks like ntm and the MALDI technology calls it abscessus but it was sent on 5/8th to reference lab to identify   Patient without respiratory sx outside of stable mild dyspnea on exertion 4-5 months. She would encounter this walking up a hill No b sx Has watery eyes and a recent dry cough but query if it's allergy No reflux No decreased appetite No weight loss   11/04/21 id clinic f/u Patient is back for f/u post susceptibility testing which show azithromycin /amikacin sensitive m abscessus She continues to feel well without b sx, cough, dyspnea, chest pain She is accompanied by her significant others today  12/28/21 id clinic f/u Patient developed itchy rash about 10 days ago involving trunk/ext. Takes benadryl  and topical hydrocortisone. 2 benadryls to help sleep. Also takes claritin. Rash is getting worse Opat labs elevated lft but improving (moderate in 100s), without eosinophilia Amikacin trough 8/24 is 0.8 with creatinine 0.98 No fever chill Occasional cough Went to ed 8/08 for sob after extreme exertion and chest ct was done -- no pe. Improved cavitation.  No joint pain/muscle aching No new vision change; she endorses having cataracts Has chronic leg cramps which she has been  using tonic water  for 6 months. Question of some tingling in hands/feet very mild  At some point she has recurrent elevation ak trough and cr and we had planned to substitute amikacin with linezolid  but she done well as above  with AK....  01/21/22 id clinic f/u Cefoxitin was hold first week of 12/2021 Her rash is much better She does at one point had yeast vaginal infection and thrush about 2 weeks ago (she got fluconazole for vaginal yeast dose and some other topical gel otc for her mouth). She still feel some taste change on her tongue/feels raw as well with hot temperature especially No visual changes No numbness/tingling hands feet Current regimen: Amikacin Azith Clofazimine Linezolid  (Doxy) Waiting for omadocycline approval again from insurance No n/v/diarrhea Rare cough Stable short of breath  She takes b6 daily for a while now  She had submitted 3 afb sputum but said the sputum are mostly upper oral. She can't get sputum up   02/08/22 id clinic f/u Patient last took abx a week ago. She was on tigycline at that time and had severe intolerable nausea/vomiting The doxycycline  is out of the question is it should never be used to m-abscessus  She is ready to start again  03/16/22 id clinic f/u Patient started on amikacin, omado, clofaz, and azithromycin  within a few days of 02/08/22 visit She is doing fine on those without any side effect Cough minimal/stable No b sx No weight loss -- weight gain actually No dyspnea Will have body ct to f/u breast cancer this December Can't get sputum up    06/16/22 id clinic f/u Had bronch mid jan and afb cx negative Chest ct 04/2022 showed stable cavitary changes sign but had become confluent No f/c/nightsweat, cough Not able to get up any sputum Reviewed opat labs. Normal creatinine and crp No n/v/diarrhea No rash  Skin is more pigmented and she asked why    09/14/22 id clinic f/u Had elective right hip arthroplasty 07/12/22. Not able to fully bear weight yet but improving. The incision had not fully closed -- there is one area about 1 cm along the incision inferiorly that is gold color on bandage. Size smaller. No pus, fever, chill. She is  following up with [facility]; 2 weeks prior to this visit there was a probe into the wound opening and testing was sent but she is not sure what was tested. No new abx She is tolerating clofaz/omado/clarithro No n/v/diarrhea No rash No cough She has seasonal allergy spring and fall bad 3 weeks prior developed pressure sensation in her head and had muffled hearing since. No facial pain. No purulent discharge from nose.  No teeth pain. She is being referred to ent Intermittent dry cough with her allergy     11/06/23 id clinic f/u Video visit See a&P   Review of Systems: ROS All other ros negative      Past Medical History:  Diagnosis Date   Allergies 1995   Per patient report 11/14/18   Anemia    prior to finding colon cancer, no longer any issues with this per patient   Anxiety    Uses for sleep and long car rides   Arthritis    Breast cancer (HCC)    Breast cancer, right breast (HCC) 09/2017   S/P mastectomy 12/05/2017   Colon cancer (HCC)    COPD (chronic obstructive pulmonary disease) (HCC)    pt reports previous MD diagnosed her with this, but takes no medications  Dyspnea    occasionally with exertion   Family history of breast cancer    Headache 1968   per patient report, started as a teenager   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   Mycobacterium infection    lung   Osteopenia 03/2018   Seen on DEXA Scan   PONV (postoperative nausea and vomiting)     Social History   Tobacco Use   Smoking status: Never   Smokeless tobacco: Never  Vaping Use   Vaping status: Never Used  Substance Use Topics   Alcohol use: Yes    Alcohol/week: 7.0 standard drinks of alcohol    Types: 7 Glasses of wine per week    Comment: 2-3 3oz glasses of wine per day   Drug use: Never    Family History  Problem Relation Age of Onset   Dementia Mother    Lung  cancer Father        smoked   Emphysema Father    Heart disease Maternal Grandfather    Cancer Maternal Aunt 27       breast cancer    Cancer Cousin        maternal first cousin with breast cancer in her 27s    Allergies  Allergen Reactions   Cefoxitin Rash   Tigecycline Diarrhea and Nausea And Vomiting   Codeine Nausea And Vomiting and Other (See Comments)    headache   Heparin  Rash    Pt states she tolerates heparin  flush for PICC but gets a full body rash with drip    OBJECTIVE: There were no vitals filed for this visit.   There is no height or weight on file to calculate BMI.   Physical Exam Video visit         Lab: Lab Results  Component Value Date   WBC 4.3 10/26/2023   HGB 13.2 10/26/2023   HCT 40.9 10/26/2023   MCV 95.6 10/26/2023   PLT 243 10/26/2023   Last metabolic panel Lab Results  Component Value Date   GLUCOSE 97 10/26/2023   NA 141 10/26/2023   K 4.1 10/26/2023   CL 105 10/26/2023   CO2 27 10/26/2023   BUN 14 10/26/2023   CREATININE 0.77 10/26/2023   GFRNONAA >60 09/21/2023   CALCIUM  9.1 10/26/2023   PHOS 3.6 11/14/2018   PROT 6.7 09/21/2023   ALBUMIN  4.3 09/21/2023   BILITOT 0.4 09/21/2023   ALKPHOS 63 09/21/2023   AST 24 09/21/2023   ALT 23 09/21/2023   ANIONGAP 6 09/21/2023    Microbiology:  Serology:  Imaging: I have reviewed the imaging personally and incorporated into decision making  08/06/21 chest abd pelv ct with contrast FINDINGS: CT CHEST FINDINGS   Cardiovascular: Aortic atherosclerosis. Normal heart size. No pericardial effusion.   Mediastinum/Nodes: No enlarged mediastinal, hilar, or axillary lymph nodes. Thyroid  gland, trachea, and esophagus demonstrate no significant findings.   Lungs/Pleura: Interval increase in wall thickening and cavitation within the right pulmonary apex, overall lesion now confluent and measuring 3.8 x 3.1 cm (series 4, image 24). Increased clustered centrilobular nodularity  and small consolidations in this vicinity (series 4, image 37). Residual bandlike scarring in the bilateral lung bases (series 4, image 109, 122). Occasional small nodules of the left lower lobe measuring up to 0.5 cm (series 4, image 21). No pleural effusion or pneumothorax.   Musculoskeletal: No chest  wall mass or suspicious osseous lesions identified. Status post bilateral mastectomy.   CT ABDOMEN PELVIS FINDINGS   Hepatobiliary: No solid liver abnormality is seen. No gallstones, gallbladder wall thickening, or biliary dilatation.   Pancreas: Unremarkable. No pancreatic ductal dilatation or surrounding inflammatory changes.   Spleen: Normal in size without significant abnormality.   Adrenals/Urinary Tract: Adrenal glands are unremarkable. Punctuate nonobstructive calculus in the inferior pole of the right kidney (series 5, image 89). No left-sided calculi, ureteral calculi, or hydronephrosis. Bladder is unremarkable.   Stomach/Bowel: Stomach is within normal limits. Appendix is not clearly visualized and may be surgically absent. The cecum is anterior to the liver. Status post sigmoid colon resection and reanastomosis. No evidence of bowel wall thickening, distention, or inflammatory changes.   Vascular/Lymphatic: Aortic atherosclerosis. No enlarged abdominal or pelvic lymph nodes.   Reproductive: No mass or other abnormality.   Other: No abdominal wall hernia or abnormality. No ascites.   Musculoskeletal: No acute osseous findings.   IMPRESSION: 1. Status post sigmoid colon resection and reanastomosis. No evidence of lymphadenopathy or metastatic disease in the chest, abdomen, or pelvis. 2. Interval increase in wall thickening and cavitation within the right pulmonary apex, overall lesion now confluent and measuring 3.8 x 3.1 cm. Increased clustered centrilobular nodularity and small consolidations in this vicinity. Findings are consistent with ongoing, worsened  cavitary infection. 3. Additional small nodules of the left lower lobe measuring up to 0.5 cm, unchanged and almost certainly benign, incidental sequelae of infection inflammation. 4. Nonobstructive right nephrolithiasis.   12/07/21  chest cta 1. No pulmonary embolism. No acute intrathoracic pathology identified. 2. Slight interval decrease in size of a cavitary lesion within the right upper lobe in keeping with at least partial response to therapy. Repeat imaging in 6-12 months would be helpful in confirming stability or resolution exclude the possibility of an underlying cavitating malignancy 3. Stable 5 mm subpleural pulmonary nodule within the left lower lobe, safely considered benign.   Assessment/plan: Problem List Items Addressed This Visit   None Visit Diagnoses       Mycobacterium abscessus infection    -  Primary     Chronic diarrhea          Abx: 10/10 - 01/23/23 omadocycline, clofaz, azith  02/08/22-06/16/22 amikacin (9/7-10/03 doxy) 9/6-10/03 linezolid  7/6-10/03 amikacin 7/6-10/03 azith 7/6-10/03 clofazimine  7/6-9/07 cefoxitin    Discussed natural history, epidimiology, pathogenesis, diagnosis criteria and treatment indication for ntm lungs  Discussed resistant nature of abscessus, cure rate, relapse rate  At this time she is completely assymptomatic but imaging has indicated progression of cavitary lesion. On cytology there was no malignant cells. She had hx of colon cancer and breast cancer at least 3 years prior to this admission, and all are in remission  At this time culture is still pending final ID confirmation and susceptibility testing  I would treat her based on progression of ct scan  Will await susceptibility and follow up with her in around 4-6 weeks to decide on treatment regimen    7/6 assessment Doing well Due to cavitary progression will do 2 iv's and 2 po's  Initial regimen will be iv amikacin, cefoxitin and po clofazimine and  azithromycin   The initial regimen will be at least 1-3 months as long as she can tolerate.  After the 3 months potentially we can keep 1 iv and the 2 PO's or transition her iv's to inhaled amikacin with the 2 po's  Our pharmacist team speaks with her today.  Will start clofaz and azith today  Once picc is in she can start the cefoxitin continuous daily infusion and tiw amikacin   Repeat chest ct in 6-8 weeks Repeat afb sputum x3 1 month after starting medication   8/29 assessment Ct improved on 8/8 Rash ?cefoxitin. No sign of DRESS or arthus reaction otherwise. Will look in to the newer tetracycline along with bedaquilline for her. Can continue as is for now her current drug regimen 3 sputum early morning ordered for afb testing -- discussed to do it before eating/drinking/brushing/gargling Repeat chest ct in 3-6 months   Discussed with our id pharmacy team   ----------- 01/21/22 id assessment For some reason insurance is making it difficult in getting omadocycline approved. They want her to try doxycycline  first  There is no evidence doxycycline  works for [employer], and omadocycline or tigecycline would need to be the back bone of treatment (tigecycline of course much more adverse side effect).  Our pharmacist is trying to get this approved with insurance. Twice now, awaiting result  She is on amikacin, azith, clofazimine, linezolid  now along with doxy (as requested by the insurance).  Cefoxitin stopped due to rash thought to be related to it.  She is tolerating it  Follow up with me in 4-6 weeks  I will ask dr Brenna about repeating a bronch in 3-6 months to get sputum again as she can't get good sputum by herself. I am not confident I can trust her expectorated sputum for sterility check  Will plan on repeat chest ct in another 3 months and see if dr Brenna could help get some sputum in 3-6 months  02/08/22 id assessment Patient ready to start abx again.  Couldn't tolerate tigecycline Iv omadocycline is covered by her insurance. They had several times unreasonably and illegally denied oral omadocycline  Will resume amikacin and omadocycline iv, with oral azithromycin  and clofazimine  Plan 3 months iv then transition to oral hopefully by that time her sputum has sterilization of m-abscessus (so around 05/2022)  03/16/22 id assessment Patient to arrange body ct with her oncologist this December She'll be at 3 months of current regimen for m-abscessus by 05/2022 which I think she should have sputum assessment again I'll ask dr Brenna to do bronchoscopy to get afb sputum culture at that time (around end of December or early 05/2022 F/u with me after 4-6 weeks after bronchoscopy done so we can decide if we need to drop one of the iv antibiotics   06/16/22 id assessment Doing well Ct chest would say fairly stable 05/2022 bronch sample afb culture negative She can't get sputum so will have to repeat bronch in 08/2022 and need at least a year of negative sample before we can stop all antibiotics  For now we can stop amikacin Will continue iv omado, oral clofaz and azithromycin   Will check with our pharmacy team to see if transitioning from iv to PO omadocycline an option (insurance issue). And will also see if inhaled amikacin liposomal an option  Will let dr icard know to bronch er in 08/2022 Chest ct in 4-6 weeks  See me 4 weeks after bronchoscopy   09/14/22 id assessment We are about due for repeat sputum afb culture which she'll need bronchoscopy due to inability to get expectorated sputum. She'll follow up with dr Brenna to get this done by June 2024 Right hip arthroplasty 3/15 with still not fully healed incision -- not sure if there is concern for hardware take  issue or infection; I asked patient to get record and any recent swab testing done of the wound Continue current 3 antibiotics clofaz/azith/omadocycline Routine blood test for abx  monitoring F/u with me 2-4 weeks after egd is done  Sign of eustachian tube dysfunction pending ent evaluation. Will send chart to our pharmacy team too to inquire about abx side effect but I do not think that's the case   Chart to be sent to dr Icard asking for bronchoscopy/afb cx by 10/2022   ----------- Addendum 09/15/22 received records from ortho 4/24 swab cx of the wound opening on the right hip --> no organisms seen; rare wbc; cx negative 4/24 sed rate 11; crp < 3 (<8)    11/24/22 id clinic assessment She went off azithromycin  during may visit with me and continued on omadocycline/clofazimine End of 08/2022 linezolid  added but she only took 2 weeks as she felt nauseous.  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  Her hip surgery had recovered.   Her hearing hasn't changed since stopping azithromycin     At this time, oral options to make it a 3 drug regimen seems limited. She doesn't want to add back iv antibiotics. I do not want to start her on arikayce in the setting of hearing loss  She mentions quality of life is more important   I would wait before adding any other antibiotics at this time, pending the bronchoscopy which is in early 12/2022  I will also refer her to [facility] to have second opinion on treatment plan   I will see her early 01/2023  I would also want to repeat sputum/chest ct early next year   01/26/23 id clinic assessment She saw Dr Florian at [facility]. She was very happy about the experience there I reviewed dr Stout's assessment. I agree and also at this time all cultures on treatment have remained negative. Sx wise not mch change and she still has hearing loss which I am unclear where it comes from; still not quite convinced the azithromycin  did  it   Eitherway we all agree to focus on quality of life and stop abx now short of the recommended duration based on pulm ntm guideline  I'll see her before summer 2025 to see how she is doing  No plan at this time to repeat labs/imaging unless she is very symptomatic  She is to see us  sooner than then if dyspea, cough, b sx, weight loss  Will copy this to dr Brenna as well    10/26/23 id clinic assessment Patient returns for a post-abx cessation clinical evaluation She had hearing loss and overall didn't feel being on abx improve her quality of life so had stopped antibiotics 01/2023 She had seen ent again and the hearing had improved based on testing but still not baseline  No difference in function status or respiratory sx   Reports today that she has been having intermittent diarrhea which the pace from monthly to weekly to daily over the last 3 weeks (daily); diarrhea started since fall 2025. Appetite remains fine. Feels bloated. No pain. Did have alternating solid stool up to 6 months ago. But again the last month just watery diarrhea. Up to 8 stools a day -- no modifying factors. Only occasionally waking her up at night  No  fever/chill/bloody stool. No gi evaluation  Hx colon cancer and partial collectomy  No weight loss   -cdiff testing -continue off ayptical mycobacteria infection treatment -no plan to repeat chest ct -- will do that if symptomatic -if cdiff testing is positive will treat; if negative will refer to GI for chronic diarrhea evaluation -for pulmonary ID issue follow up with me next year otherwise -encourage patient to follow as well with pulm once a year to at least if we need pft to access progression of NTM   --------------- Addendum Negative cdiff testing Gi referral for chronic diarrhea placed 10/31/23   11/06/2023 id assessment Diarrhea still same several times a day But appetite is baseline, and no fever, chill, abd pain Previous visit gi referral  placed but not yet scheduled Cdiff testing negative   Will have Rollene Maria our referral manager review gi referral   F/u once a year otherwise for pulmonary issue   I connected with  [name] on 11/15/2023  I verified that I was speaking with the correct person using two identifiers. Due to the COVID-19 Pandemic, this service was provided via telemedicine using audio/visual media.   The patient was located at home. The provider was located in the office. The patient did consent to this visit and is aware of charges through their insurance as well as the limitations of evaluation and management by telemedicine. Other persons participating in this telemedicine service were none. Time spent on visit was greater than 30 minutes on media and in coordination of care   Follow-up: Return in about 1 year (around 11/05/2024).  Constance ONEIDA Passer, MD [facility] 11/06/2023, 3:04 PM

## 2023-11-23 ENCOUNTER — Other Ambulatory Visit: Payer: Self-pay | Admitting: Hematology

## 2023-12-14 ENCOUNTER — Encounter: Payer: Self-pay | Admitting: Gastroenterology

## 2024-02-07 ENCOUNTER — Ambulatory Visit: Admitting: Gastroenterology

## 2024-03-20 ENCOUNTER — Other Ambulatory Visit: Payer: Self-pay

## 2024-03-20 DIAGNOSIS — C50511 Malignant neoplasm of lower-outer quadrant of right female breast: Secondary | ICD-10-CM

## 2024-03-21 ENCOUNTER — Inpatient Hospital Stay: Admitting: Nurse Practitioner

## 2024-03-21 ENCOUNTER — Inpatient Hospital Stay: Attending: Nurse Practitioner

## 2024-03-21 VITALS — BP 123/65 | HR 69 | Temp 97.8°F | Resp 18 | Wt 144.2 lb

## 2024-03-21 DIAGNOSIS — Z1732 Human epidermal growth factor receptor 2 negative status: Secondary | ICD-10-CM | POA: Insufficient documentation

## 2024-03-21 DIAGNOSIS — I2510 Atherosclerotic heart disease of native coronary artery without angina pectoris: Secondary | ICD-10-CM | POA: Insufficient documentation

## 2024-03-21 DIAGNOSIS — Z79899 Other long term (current) drug therapy: Secondary | ICD-10-CM | POA: Diagnosis not present

## 2024-03-21 DIAGNOSIS — Z885 Allergy status to narcotic agent status: Secondary | ICD-10-CM | POA: Insufficient documentation

## 2024-03-21 DIAGNOSIS — Z170 Estrogen receptor positive status [ER+]: Secondary | ICD-10-CM

## 2024-03-21 DIAGNOSIS — K449 Diaphragmatic hernia without obstruction or gangrene: Secondary | ICD-10-CM | POA: Diagnosis not present

## 2024-03-21 DIAGNOSIS — K644 Residual hemorrhoidal skin tags: Secondary | ICD-10-CM | POA: Diagnosis not present

## 2024-03-21 DIAGNOSIS — M85851 Other specified disorders of bone density and structure, right thigh: Secondary | ICD-10-CM | POA: Insufficient documentation

## 2024-03-21 DIAGNOSIS — K3189 Other diseases of stomach and duodenum: Secondary | ICD-10-CM | POA: Insufficient documentation

## 2024-03-21 DIAGNOSIS — Z1721 Progesterone receptor positive status: Secondary | ICD-10-CM | POA: Diagnosis not present

## 2024-03-21 DIAGNOSIS — C50511 Malignant neoplasm of lower-outer quadrant of right female breast: Secondary | ICD-10-CM

## 2024-03-21 DIAGNOSIS — D649 Anemia, unspecified: Secondary | ICD-10-CM | POA: Diagnosis not present

## 2024-03-21 DIAGNOSIS — E2839 Other primary ovarian failure: Secondary | ICD-10-CM

## 2024-03-21 DIAGNOSIS — J984 Other disorders of lung: Secondary | ICD-10-CM | POA: Insufficient documentation

## 2024-03-21 DIAGNOSIS — Z9013 Acquired absence of bilateral breasts and nipples: Secondary | ICD-10-CM | POA: Insufficient documentation

## 2024-03-21 DIAGNOSIS — C187 Malignant neoplasm of sigmoid colon: Secondary | ICD-10-CM | POA: Diagnosis not present

## 2024-03-21 DIAGNOSIS — K259 Gastric ulcer, unspecified as acute or chronic, without hemorrhage or perforation: Secondary | ICD-10-CM | POA: Diagnosis not present

## 2024-03-21 DIAGNOSIS — Z79811 Long term (current) use of aromatase inhibitors: Secondary | ICD-10-CM | POA: Diagnosis not present

## 2024-03-21 DIAGNOSIS — I700 Atherosclerosis of aorta: Secondary | ICD-10-CM | POA: Diagnosis not present

## 2024-03-21 DIAGNOSIS — Z888 Allergy status to other drugs, medicaments and biological substances status: Secondary | ICD-10-CM | POA: Diagnosis not present

## 2024-03-21 DIAGNOSIS — K648 Other hemorrhoids: Secondary | ICD-10-CM | POA: Diagnosis not present

## 2024-03-21 DIAGNOSIS — C19 Malignant neoplasm of rectosigmoid junction: Secondary | ICD-10-CM | POA: Diagnosis not present

## 2024-03-21 DIAGNOSIS — M1990 Unspecified osteoarthritis, unspecified site: Secondary | ICD-10-CM | POA: Insufficient documentation

## 2024-03-21 DIAGNOSIS — C7800 Secondary malignant neoplasm of unspecified lung: Secondary | ICD-10-CM | POA: Insufficient documentation

## 2024-03-21 DIAGNOSIS — Z923 Personal history of irradiation: Secondary | ICD-10-CM | POA: Diagnosis not present

## 2024-03-21 DIAGNOSIS — L905 Scar conditions and fibrosis of skin: Secondary | ICD-10-CM | POA: Diagnosis not present

## 2024-03-21 LAB — CMP (CANCER CENTER ONLY)
ALT: 25 U/L (ref 0–44)
AST: 25 U/L (ref 15–41)
Albumin: 4.2 g/dL (ref 3.5–5.0)
Alkaline Phosphatase: 65 U/L (ref 38–126)
Anion gap: 9 (ref 5–15)
BUN: 13 mg/dL (ref 8–23)
CO2: 28 mmol/L (ref 22–32)
Calcium: 9.3 mg/dL (ref 8.9–10.3)
Chloride: 104 mmol/L (ref 98–111)
Creatinine: 0.77 mg/dL (ref 0.44–1.00)
GFR, Estimated: >60 mL/min (ref >60)
Glucose, Bld: 106 mg/dL — ABNORMAL HIGH (ref 70–99)
Potassium: 4 mmol/L (ref 3.5–5.1)
Sodium: 141 mmol/L (ref 135–145)
Total Bilirubin: 0.3 mg/dL (ref 0.0–1.2)
Total Protein: 6.3 g/dL — ABNORMAL LOW (ref 6.5–8.1)

## 2024-03-21 LAB — CBC WITH DIFFERENTIAL (CANCER CENTER ONLY)
Abs Immature Granulocytes: 0.01 K/uL (ref 0.00–0.07)
Basophils Absolute: 0 K/uL (ref 0.0–0.1)
Basophils Relative: 1 %
Eosinophils Absolute: 0.2 K/uL (ref 0.0–0.5)
Eosinophils Relative: 5 %
HCT: 38 % (ref 36.0–46.0)
Hemoglobin: 12.5 g/dL (ref 12.0–15.0)
Immature Granulocytes: 0 %
Lymphocytes Relative: 21 %
Lymphs Abs: 1 K/uL (ref 0.7–4.0)
MCH: 30.9 pg (ref 26.0–34.0)
MCHC: 32.9 g/dL (ref 30.0–36.0)
MCV: 94.1 fL (ref 80.0–100.0)
Monocytes Absolute: 0.3 K/uL (ref 0.1–1.0)
Monocytes Relative: 7 %
Neutro Abs: 3.2 K/uL (ref 1.7–7.7)
Neutrophils Relative %: 66 %
Platelet Count: 258 K/uL (ref 150–400)
RBC: 4.04 MIL/uL (ref 3.87–5.11)
RDW: 12.8 % (ref 11.5–15.5)
WBC Count: 4.8 K/uL (ref 4.0–10.5)
nRBC: 0 % (ref 0.0–0.2)

## 2024-03-21 LAB — FERRITIN: Ferritin: 47 ng/mL (ref 11–307)

## 2024-03-21 NOTE — Progress Notes (Signed)
 Patient Care Team: Sonia Frederick, PA-C as PCP - General (Physician Assistant) Vanderbilt Ned, MD as Consulting Physician (General Surgery) Dewey Rush, MD as Consulting Physician (Radiation Oncology) Anitra Harlene BRAVO, RN as Registered Nurse Veryl Charleston, MD as Referring Physician (Dermatology) Lanny Callander, MD as Consulting Physician (Hematology) Teresa Lonni HERO, MD as Consulting Physician (General Surgery) Lanny Callander, MD as Consulting Physician (Hematology) Colon Shove, MD as Consulting Physician (Neurosurgery)  Clinic Day:  03/22/2024  Referring physician: Samie Frederick, PA-C  I connected with Sonia Sandoval on 03/22/24 at  1:30 PM EST by telephone and verified that I am speaking with the correct person using two identifiers.   I discussed the limitations, risks, security and privacy concerns of performing an evaluation and management service by telemedicine and the availability of in-person appointments. I also discussed with the patient that there may be a patient responsible charge related to this service. The patient expressed understanding and agreed to proceed.   Other persons participating in the visit and their role in the encounter: none   Patient's location: home   Provider's location: [facility]    Chief Complaint: hx. Right breast cancer   ASSESSMENT & PLAN:   Assessment & Plan: Malignant neoplasm of lower-outer quadrant of right breast of female, estrogen receptor positive (HCC) -Stage IIA,pT2N1M0, ER+/PR+/HER2-, G3 -diagnosed in 09/2017. S/p B/l mastectomies by Dr Vanderbilt and adjuvant radiation by Dr Dewey -She started on Anastrozole  in 04/2018, switched to exemestane  in 08/2020 due to concerns with joint pain. Ultimately discontinued 07/2021 due to worsening joint pain from osteoarthritis.  -We previously discussed switching to tamoxifen , patient declined due to concern for side effects.  Her arthralgia did not improve after she came off  exemestane , so she restarted exemestane  in 01/2022. She declined Tamoxifen   -She will complete 5 years of aromatase inhibitor in July 2025. The benefit of extended adjuvant AI was discussed. Breast cancer index test was recommended to see if she would benefit from extended AI.  She agrees. -Breast cancer index testing indicated that she would benefit from extended therapy with AI exemestane .  Risk of recurrence/metastases in 10 years with 5 years of treatment is 18.7%.  With extended treatment with AI, risk of recurrence/metastases decreases to 6.2 to 7.8% over the next 10 years. - Will continue exemestane  daily. - Plan for labs and follow-up in 6 months, sooner if needed.  Cancer of sigmoid colon (HCC) pT3N0M0, stage II -diagnosed in 08/2019 by colonoscopy for anemia. 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 Margins and lymph nodes negative. -Adjuvant chemotherapy was not recommended -GuardianReveal in 04/2020 was negative. Repeat not performed due to insurance. 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  -she is 3 years out from initial diagnosis, risk of recurrence is low now, I do not plan to repeat surveillance CTs. -Continue lab and follow-up for additional 2 years.   Bone health Patient overdue for DEXA scan.  Has been on AI since 2019.  Most recent DEXA scan appears to be 2021.  She does take calcium  and vitamin D  every day.  She is active with low impact exercises on regular basis.  A new DEXA scan was ordered as part of today's visit.  Right breast cancer, ER + She continues exemestane  daily with mild and manageable hot flashes.  No role for routine mammogram or breast imaging due to bilateral mastectomy with reconstruction.  DEXA scan ordered as she appears overdue.  Plan to see her back in 6 months, sooner if needed.  Cancer of sigmoid  colon She is 4 years postdiagnosis. Due to persistent diarrhea, patient has been closely monitored per GI and primary care.  She did have diagnostic colonoscopy last week.  Procedure reports and pathology are not yet posted however, patient reports she was told good results.  Will continue cancer surveillance every 6 months with labs.  Plan Labs reviewed with patient. - CBC, CMP, and ferritin all unremarkable. DEXA scan ordered as part of today's visit. Await results of colonoscopy with pathology results. No role for breast imaging due to bilateral mastectomy with reconstruction. Continue exemestane  daily. Plan for labs and follow-up in 6 months, sooner if needed.  The patient understands the plans discussed today and is in agreement with them.  She knows to contact our office if she develops concerns prior to her next appointment.  Time spent with the patient was approximately 15 minutes. This time included reviewing progress notes, labs, imaging studies, and discussing plan for follow up.     Powell FORBES Lessen, NP  [facility] - [facility] DE[facility] [address] [place] [address] Dept: [phone] Dept Fax: [phone]   Orders Placed This Encounter  Procedures   DG Bone Density    Standing Status:   Future    Expected Date:   05/21/2024    Expiration Date:   03/21/2025    Reason for Exam (SYMPTOM  OR DIAGNOSIS REQUIRED):   estrogen deficiency. on exemestane     Preferred imaging location?:   [facility]      CHIEF COMPLAINT:  CC: right breast cancer, ER+  Current Treatment:  exemestane    INTERVAL HISTORY:  Sonia Sandoval is here today for repeat clinical assessment. She was last seen by me on 09/21/2023.  She continues to do well with exemestane  has have hot flashes.  States she feels warm.  Often.  Otherwise, she tolerates this very well.  She has had a long journey with persistent diarrhea.  This  started while she was on antibiotics for approximately 14 months due to significant lung infection.  She does see ID for this.  She had multiple stool studies done.  She was negative for C. difficile.  She reports having a colonoscopy last week.  Results are not posted yet.  She states she was told everything looked very good.  They started a high-fiber regimen including Benefiber.  She denies any new palpable changes in the chest wall or axillary regions.  She states, other than persistent diarrhea, she has been doing very well.  She denies chest pain, chest pressure, or shortness of breath. She denies headaches or visual disturbances.  She denies fevers or chills. She denies pain. Her appetite is fair. Her weight has been stable.  I have reviewed the past medical history, past surgical history, social history and family history with the patient and they are unchanged from previous note.  ALLERGIES:  is allergic to cefoxitin, tigecycline, codeine, and heparin .  MEDICATIONS:  Current Outpatient Medications  Medication Sig Dispense Refill   ALPRAZolam  (NIRAVAM ) 0.5 MG dissolvable tablet Take 0.5 mg by mouth daily as needed (Sleep/Travel).     Calcium  Carb-Cholecalciferol  600-25 MG-MCG CAPS Take by mouth.     Cholecalciferol  (VITAMIN D ) 50 MCG (2000 UT) tablet Take 2,000 Units by mouth daily.     cyanocobalamin  (VITAMIN  B12) 1000 MCG tablet Take 1,000 mcg by mouth daily. Chew     exemestane  (AROMASIN ) 25 MG tablet TAKE 1 TABLET (25 MG TOTAL) BY MOUTH DAILY AFTER BREAKFAST. 90 tablet 1   gabapentin  (NEURONTIN ) 300 MG capsule Take 300 mg by mouth daily.     Prenatal Vit-Fe Fumarate-FA (PRENATAL VITAMINS PLUS PO)      Probiotic Product (PROBIOTIC PO) Take 2 capsules by mouth daily. 800 Million CFU     tiZANidine (ZANAFLEX) 4 MG tablet TAKE 1/2 TO 1 TABLET AT BEDTIME AS NEEDED FOR SPASM     vitamin E  180 MG (400 UNITS) capsule Take 400 Units by mouth daily.     No current facility-administered  medications for this visit.    HISTORY OF PRESENT ILLNESS:   Oncology History Overview Note  Cancer Staging Cancer of sigmoid colon [facility]) Staging form: Colon and Rectum, AJCC 8th Edition - Pathologic stage from 11/22/2019: Stage IIA (pT3, pN0, cM0) - Signed by Lanny Callander, MD on 12/17/2019  Malignant neoplasm of lower-outer quadrant of right breast of female, estrogen receptor positive (HCC) Staging form: Breast, AJCC 8th Edition - Clinical: Stage IIB (cT2, cN1, cM0, G3, ER+, PR+, HER2-) - Unsigned - Pathologic: Stage IIA (pT2, pN1(sn), cM0, G3, ER+, PR+, HER2-) - Unsigned    Malignant neoplasm of lower-outer quadrant of right breast of female, estrogen receptor positive (HCC)  10/23/2017 Initial Diagnosis    right breast lower outer quadrant biopsy 10/23/2017 for a clinical T2 N1, stage IIB invasive ductal carcinoma, grade 3, estrogen and progesterone receptor positive, HER-2 not amplified, with an MIB-1 of 20%   11/07/2017 Miscellaneous   Mammaprint on was read as low risk, predicting no significant chemotherapy benefit with a 5-year distant disease-free survival in the 97-98% range with hormone therapy alone   12/05/2017 Surgery   status post bilateral mastectomies on 12/05/2017 showing             (a) on the left, no evidence of malignancy             (b) on the right, a pT2 pN1, stage IIB invasive ductal carcinoma, grade 3, with negative margins. By Dr Vanderbilt   01/15/2018 - 02/28/2018 Radiation Therapy   adjuvant radiation 01/15/2018 - 02/28/2018 with Dr Dewey              Site/dose: The patient initially received a dose of 50.4 Gy in 28 fractions to the chest wall and supraclavicular region. This was delivered using a 3-D conformal, 4 field technique. The patient then received a boost to the mastectomy scar. This delivered an additional 10 Gy in 5 fractions using an en face electron field. The total dose was 60.4 Gy.    04/01/2018 -  Anti-estrogen oral therapy   started anastrozole  in  04/2018. Switched to Exemestane  in 08/2020 due to joint pain.             (a) bone density 03/07/2018 with a T score of -1.8. Her 04/21/20 DEXA showed osteopenia -1.8 at right total femur, with low risk for fracture.   10/28/2019 Genetic Testing   Negative genetic testing.  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  The Common Hereditary Gene Panel offered by [facility] includes sequencing and/or deletion duplication testing of the following 48 genes: APC, ATM, AXIN2, BARD1, BMPR1A, BRCA1, BRCA2, BRIP1, CDH1, CDK4, CDKN2A (p14ARF), CDKN2A (p16INK4a), CHEK2, CTNNA1, DICER1, EPCAM (Deletion/duplication testing only), GREM1 (promoter region deletion/duplication testing only), KIT, MEN1, MLH1, MSH2, MSH3, MSH6, MUTYH,  NBN, NF1, NHTL1, PALB2, PDGFRA, PMS2, POLD1, POLE, PTEN, RAD50, RAD51C, RAD51D, RNF43, SDHB, SDHC, SDHD, SMAD4, SMARCA4. STK11, TP53, TSC1, TSC2, and VHL.  The following genes were evaluated for sequence changes only: SDHA and HOXB13 c.251G>A variant only. The report date is October 28, 2019.   03/08/2021 Imaging   CT Chest w/o contrast  IMPRESSION: 1. The previous FDG avid lung mass within the anterior right lower lobe has decreased in size compared with previous exam compatible with response to therapy. The cavitary lung nodule within the right apex is slightly increased in size from previous exam. Additionally, there is a new irregular nodule within the medial right upper lobe measuring 6 mm. Previously 3 mm. 2. Interval increase in size of cavitary nodule within the right lung apex. This measures 2.3 x 1.5 cm, compared with 1.3 x 0.9 cm previously. Underlying malignancy cannot be excluded. 3. New small nodule within the right upper lobe measures 6 mm. This has a nonspecific appearance but warrants attention on follow-up imaging. 4. The remaining small pulmonary nodules noted previously are stable in the interval. 5. Coronary artery calcifications noted. 6. Aortic  Atherosclerosis (ICD10-I70.0).   Cancer of sigmoid colon (HCC)  09/23/2019 Tumor Marker   Baseline CEA 1.4 on 09/23/19    09/23/2019 Procedure   Colonoscopy and Upper endoscopy with Dr Elicia 09/23/19  Upper Endoscopy Impression - Z-line regular, 35 cm from the incisors. - Small hiatal hernia. - Non-bleeding gastric ulcer. Biopsied. - Hematin (altered blood/coffee-ground-like material) in the gastric body. - Normal duodenal bulb, first portion of the duodenum and second portion of - Normal duodenal bulb, first portion of the duodenum and second portion of the duodenum.  Colonoscopy Impression - Preparation of the colon was poor. - Perianal skin tags found on perianal exam. - Likely malignant tumor in the recto-sigmoid colon. Biopsied. Tattooed. - Internal hemorrhoids.   09/23/2019 Initial Biopsy   FINAL MICROSCOPIC DIAGNOSIS: 09/23/19 A. STOMACH, ULCER, BIOPSY:  - Gastric antral mucosa with mild nonspecific reactive gastropathy  - Warthin Starry stain is negative for Helicobacter pylori   B. COLON, RECTOSIGMOID, MASS, BIOPSY:  - Adenocarcinoma, see comment  COMMENT:   B.  Immunohistochemical stains for MMR-related proteins are pending and  will be reported in an addendum. Dr. Belvie reviewed the case and  concurs with the diagnosis. Dr. Elicia was notified on 09/24/2019.     ADDENDUM:  Mismatch Repair Protein (IHC)  SUMMARY INTERPRETATION: NORMAL  There is preserved expression of the major MMR proteins. There is a very  low probability that microsatellite instability (MSI) is present.  However, certain clinically significant MMR protein mutations may result  in preservation of nuclear expression. It is recommended that the  preservation of protein expression be correlated with molecular based  MSI testing.   IHC EXPRESSION RESULTS  TEST           RESULT  MLH1:          Preserved nuclear expression  MSH2:          Preserved nuclear expression  MSH6:           Preserved nuclear expression  PMS2:          Preserved nuclear expression    09/23/2019 Imaging   CT CAP W Contrast 09/23/19  IMPRESSION: 1. There is eccentric wall thickening of the rectosigmoid colon, in keeping with mass identified and biopsied by colonoscopy.   2. There are prominent subcentimeter lymph nodes in the sigmoid mesocolon, suspicious for nodal metastatic  disease. There is no other lymphadenopathy in the abdomen or pelvis.   3. There are multiple small bilateral pulmonary nodules measuring up to 4 mm, nonspecific although very suspicious for pulmonary metastatic disease. Given history of breast malignancy, previous imaging of the chest would be very helpful to assess for stability. This report may be addended for comparison if prior imaging can be obtained.   4. Status post bilateral mastectomy with irregular subpleural opacity of the anterior right upper lobe, consistent with history of breast malignancy and radiation fibrosis.   5. There is adjacent nodularity of the right pulmonary apex, the largest discrete nodular component measuring up to 1.1 cm. This is more favored infectious or inflammatory although nonspecific and metastatic disease is not excluded. As as above, prior imaging would be very helpful to assess.   6. Small focus of nonspecific infectious or inflammatory airspace disease of the perihilar left upper lobe.   09/26/2019 Initial Diagnosis   Cancer of sigmoid colon (HCC)   10/28/2019 PET scan   IMPRESSION: 1. Increased tracer uptake within the pelvis corresponding to wall thickening of the rectosigmoid colon favored to represent primary colorectal carcinoma. No findings of FDG avid nodal metastasis or solid organ metastasis within the abdomen or pelvis. 2. Within the right lung apex there is a cluster of 3 subpleural nodular densities which exhibit varying degrees of mild FDG uptake with SUV max between 1.9 and 3.1. Morphologically the  appearance is suggestive of benign postinflammatory pleuroparenchymal scarring. This would be an atypical pattern of metastatic disease, which is considered less favored, but not excluded. Attention in this area on serial follow-up imaging is advised. 3. Two small, less than 5 mm left lower lobe lung nodules are too small to reliably characterize. 4.  Aortic Atherosclerosis (ICD10-I70.0). 5. Nonobstructing right renal calculus.     11/22/2019 Cancer Staging   Staging form: Colon and Rectum, AJCC 8th Edition - Pathologic stage from 11/22/2019: Stage IIA (pT3, pN0, cM0) - Signed by Lanny Callander, MD on 12/17/2019   11/22/2019 Surgery   XI ROBOT ASSISTED SIGMOIDECTOMY and FLEXIBLE SIGMOIDOSCOPY by Dr white and Dr Debby   11/22/2019 Pathology Results   FINAL MICROSCOPIC DIAGNOSIS:   A. SIGMOID COLON, RESECTION:  - Invasive moderately differentiated adenocarcinoma, 4.5 cm, involving distal sigmoid colon  - Carcinoma focally invades into pericolonic soft tissue  - Resection margins are negative for carcinoma  - Negative for lymphovascular or perineural invasion  - Eighteen benign lymph nodes (0/18)  - See oncology table   B. FINAL DISTAL MARGIN, EXCISION:  - Colonic donut, negative for carcinoma    04/15/2020 Imaging   CT Chest  IMPRESSION: 1. New nodular 1.9 cm focus of consolidation in the anterior right lower lobe with surrounding mild patchy tree-in-bud opacity, nonspecific but favoring infectious/inflammatory etiology. 2. Clustered nodularity in the apical right upper lobe is stable in size and newly cavitary. These findings are indeterminate for atypical infection such as MAI versus response to therapy of pulmonary metastases. Additional scattered tiny left pulmonary nodules are stable. Suggest close chest CT follow-up in 3 months. 3. No thoracic adenopathy. 4. Stable dilated main pulmonary artery, suggesting chronic pulmonary arterial hypertension. 5. Aortic Atherosclerosis  (ICD10-I70.0).   04/17/2020 Tumor Marker   Her Guardant Reveal showed negative - ctDNA not detected.    07/08/2020 Imaging   CT Chest  IMPRESSION: Slight reduction in size of hypermetabolic right upper lobe pulmonary nodule, nonspecific though favored to represent area of infection/inflammation as was initially suggested on chest  CT performed 04/15/2020. No biopsy attempted at this time.   03/08/2021 Imaging   CT Chest w/o contrast  IMPRESSION: 1. The previous FDG avid lung mass within the anterior right lower lobe has decreased in size compared with previous exam compatible with response to therapy. The cavitary lung nodule within the right apex is slightly increased in size from previous exam. Additionally, there is a new irregular nodule within the medial right upper lobe measuring 6 mm. Previously 3 mm. 2. Interval increase in size of cavitary nodule within the right lung apex. This measures 2.3 x 1.5 cm, compared with 1.3 x 0.9 cm previously. Underlying malignancy cannot be excluded. 3. New small nodule within the right upper lobe measures 6 mm. This has a nonspecific appearance but warrants attention on follow-up imaging. 4. The remaining small pulmonary nodules noted previously are stable in the interval. 5. Coronary artery calcifications noted. 6. Aortic Atherosclerosis (ICD10-I70.0).   04/04/2022 Imaging    IMPRESSION: 1. Cavitary right apical lesion has become confluent in the interval, measuring minimally smaller at 3.1 x 2.1 cm compared to 3.2 x 2.8 cm previously. 2. Stable left lower lobe pulmonary nodules measuring up to 6 mm. 3. Enlargement of the pulmonary outflow tract/main pulmonary arteries suggests pulmonary arterial hypertension. 4.  Aortic Atherosclerosis (ICD10-I70.0).   10/21/2022 Imaging    IMPRESSION: No evidence of recurrent or metastatic carcinoma within the abdomen or pelvis.   Tiny right renal calculus. No evidence of ureteral calculi  or hydronephrosis.   Large stool burden noted; recommend correlation for symptoms or signs of constipation.   Stable sub-cm left lower lobe pulmonary nodules. Recommend continued attention on follow-up imaging.         REVIEW OF SYSTEMS:   Constitutional: Denies fevers, chills or abnormal weight loss Eyes: Denies blurriness of vision Ears, nose, mouth, throat, and face: Denies mucositis or sore throat Respiratory: Denies cough, dyspnea or wheezes Cardiovascular: Denies palpitation, chest discomfort or lower extremity swelling Gastrointestinal:  Denies nausea or heartburn. She is being evaluated for chronic diarrhea which started after being treated for unusual respiratory infection with long term antibiotics. She started a high fiber diet including fiber supplements today, after results from colonoscopy were favorable.  Skin: Denies abnormal skin rashes Lymphatics: Denies new lymphadenopathy or easy bruising Neurological:Denies numbness, tingling or new weaknesses Behavioral/Psych: Mood is stable, no new changes  All other systems were reviewed with the patient and are negative.   VITALS:   Today's Vitals   03/21/24 1323  BP: 123/65  Pulse: 69  Resp: 18  Temp: 97.8 F (36.6 C)  TempSrc: Temporal  SpO2: 99%  Weight: 144 lb 3.2 oz (65.4 kg)   Body mass index is 25.54 kg/m.   Wt Readings from Last 3 Encounters:  03/21/24 144 lb 3.2 oz (65.4 kg)  10/26/23 149 lb (67.6 kg)  09/21/23 150 lb 9.6 oz (68.3 kg)    Body mass index is 25.54 kg/m.  Performance status (ECOG): 1 - Symptomatic but completely ambulatory  LABORATORY DATA:  I have reviewed the data as listed    Component Value Date/Time   NA 141 03/21/2024 1237   K 4.0 03/21/2024 1237   CL 104 03/21/2024 1237   CO2 28 03/21/2024 1237   GLUCOSE 106 (H) 03/21/2024 1237   BUN 13 03/21/2024 1237   CREATININE 0.77 03/21/2024 1237   CREATININE 0.77 10/26/2023 1402   CALCIUM  9.3 03/21/2024 1237   PROT 6.3 (L)  03/21/2024 1237   ALBUMIN  4.2 03/21/2024 1237  AST 25 03/21/2024 1237   ALT 25 03/21/2024 1237   ALKPHOS 65 03/21/2024 1237   BILITOT 0.3 03/21/2024 1237   GFRNONAA >60 03/21/2024 1237   GFRAA >60 12/18/2019 1026   GFRAA >60 10/21/2019 0844    Lab Results  Component Value Date   WBC 4.8 03/21/2024   NEUTROABS 3.2 03/21/2024   HGB 12.5 03/21/2024   HCT 38.0 03/21/2024   MCV 94.1 03/21/2024   PLT 258 03/21/2024

## 2024-03-21 NOTE — Assessment & Plan Note (Addendum)
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 S/p B/l mastectomies by Dr Vanderbilt and adjuvant radiation by Dr Dewey -She started on Anastrozole  in 04/2018, switched to exemestane  in 08/2020 due to concerns with joint pain. Ultimately discontinued 07/2021 due to worsening joint pain from osteoarthritis.  -We previously discussed switching to tamoxifen , patient declined due to concern for side effects.  Her arthralgia did not improve after she came off exemestane , so she restarted exemestane  in 01/2022. She declined Tamoxifen   -She will complete 5 years of aromatase inhibitor in July 2025. The benefit of extended adjuvant AI was discussed. Breast cancer index test was recommended to see if she would benefit from extended AI.  She agrees. -Breast cancer index testing indicated that she would benefit from extended therapy with AI exemestane .  Risk of recurrence/metastases in 10 years with 5 years of treatment is 18.7%.  With extended treatment with AI, risk of recurrence/metastases decreases to 6.2 to 7.8% over the next 10 years. - Will continue exemestane  daily. - Plan for labs and follow-up in 6 months, sooner if needed.

## 2024-03-21 NOTE — Assessment & Plan Note (Addendum)
pT3N0M0, stage II -diagnosed in 08/2019 by colonoscopy for anemia. 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 Margins and lymph nodes negative. -Adjuvant chemotherapy was not recommended -GuardianReveal in 04/2020 was negative. Repeat not performed due to insurance. 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  -she is 3 years out from initial diagnosis, risk of recurrence is low now, I do not plan to repeat surveillance CTs. -Continue lab and follow-up for additional 2 years.

## 2024-03-22 ENCOUNTER — Encounter: Payer: Self-pay | Admitting: Hematology

## 2024-03-22 ENCOUNTER — Encounter: Payer: Self-pay | Admitting: Nurse Practitioner
